# Patient Record
Sex: Male | Born: 1970 | Race: White | Hispanic: No | Smoking: Never smoker
Health system: Southern US, Community
[De-identification: ages and names within clinical notes are randomized; demographics above are authoritative.]

## PROBLEM LIST (undated history)

## (undated) DIAGNOSIS — E11319 Type 2 diabetes mellitus with unspecified diabetic retinopathy without macular edema: Secondary | ICD-10-CM

## (undated) DIAGNOSIS — R161 Splenomegaly, not elsewhere classified: Secondary | ICD-10-CM

## (undated) DIAGNOSIS — R519 Headache, unspecified: Secondary | ICD-10-CM

## (undated) DIAGNOSIS — J189 Pneumonia, unspecified organism: Secondary | ICD-10-CM

## (undated) DIAGNOSIS — K219 Gastro-esophageal reflux disease without esophagitis: Secondary | ICD-10-CM

## (undated) DIAGNOSIS — E7800 Pure hypercholesterolemia, unspecified: Secondary | ICD-10-CM

## (undated) DIAGNOSIS — I10 Essential (primary) hypertension: Secondary | ICD-10-CM

## (undated) DIAGNOSIS — G4730 Sleep apnea, unspecified: Secondary | ICD-10-CM

## (undated) DIAGNOSIS — E119 Type 2 diabetes mellitus without complications: Secondary | ICD-10-CM

## (undated) DIAGNOSIS — H3581 Retinal edema: Secondary | ICD-10-CM

## (undated) DIAGNOSIS — U071 COVID-19: Secondary | ICD-10-CM

## (undated) DIAGNOSIS — I509 Heart failure, unspecified: Secondary | ICD-10-CM

## (undated) DIAGNOSIS — Q800 Ichthyosis vulgaris: Secondary | ICD-10-CM

## (undated) DIAGNOSIS — Z87442 Personal history of urinary calculi: Secondary | ICD-10-CM

## (undated) HISTORY — PX: OTHER SURGICAL HISTORY: SHX169

## (undated) HISTORY — PX: CARDIAC CATHETERIZATION: SHX172

## (undated) HISTORY — DX: Ichthyosis vulgaris: Q80.0

## (undated) HISTORY — DX: Retinal edema: H35.81

## (undated) HISTORY — PX: TONSILLECTOMY: SUR1361

## (undated) HISTORY — DX: Type 2 diabetes mellitus with unspecified diabetic retinopathy without macular edema: E11.319

---

## 2015-02-23 DIAGNOSIS — Q800 Ichthyosis vulgaris: Secondary | ICD-10-CM | POA: Insufficient documentation

## 2015-02-23 DIAGNOSIS — IMO0002 Reserved for concepts with insufficient information to code with codable children: Secondary | ICD-10-CM | POA: Insufficient documentation

## 2015-02-23 DIAGNOSIS — E785 Hyperlipidemia, unspecified: Secondary | ICD-10-CM | POA: Insufficient documentation

## 2015-06-25 DIAGNOSIS — M1711 Unilateral primary osteoarthritis, right knee: Secondary | ICD-10-CM | POA: Insufficient documentation

## 2015-06-25 DIAGNOSIS — M179 Osteoarthritis of knee, unspecified: Secondary | ICD-10-CM | POA: Insufficient documentation

## 2017-12-12 DIAGNOSIS — M2550 Pain in unspecified joint: Secondary | ICD-10-CM | POA: Insufficient documentation

## 2020-02-23 DIAGNOSIS — U071 COVID-19: Secondary | ICD-10-CM

## 2020-02-23 HISTORY — DX: COVID-19: U07.1

## 2020-04-05 ENCOUNTER — Other Ambulatory Visit: Payer: Self-pay

## 2020-04-05 ENCOUNTER — Inpatient Hospital Stay (HOSPITAL_COMMUNITY)
Admission: EM | Admit: 2020-04-05 | Discharge: 2020-04-07 | DRG: 086 | Disposition: A | Discharge status: Home / Self Care | Payer: Medicaid - Out of State | Attending: Internal Medicine | Admitting: Internal Medicine

## 2020-04-05 ENCOUNTER — Encounter (HOSPITAL_COMMUNITY): Payer: Self-pay | Admitting: Emergency Medicine

## 2020-04-05 ENCOUNTER — Emergency Department (HOSPITAL_COMMUNITY): Payer: Medicaid - Out of State

## 2020-04-05 DIAGNOSIS — I619 Nontraumatic intracerebral hemorrhage, unspecified: Secondary | ICD-10-CM

## 2020-04-05 DIAGNOSIS — E876 Hypokalemia: Secondary | ICD-10-CM | POA: Diagnosis not present

## 2020-04-05 DIAGNOSIS — R109 Unspecified abdominal pain: Secondary | ICD-10-CM

## 2020-04-05 DIAGNOSIS — R112 Nausea with vomiting, unspecified: Secondary | ICD-10-CM | POA: Diagnosis present

## 2020-04-05 DIAGNOSIS — E1165 Type 2 diabetes mellitus with hyperglycemia: Secondary | ICD-10-CM | POA: Diagnosis present

## 2020-04-05 DIAGNOSIS — R161 Splenomegaly, not elsewhere classified: Secondary | ICD-10-CM

## 2020-04-05 DIAGNOSIS — E785 Hyperlipidemia, unspecified: Secondary | ICD-10-CM | POA: Diagnosis present

## 2020-04-05 DIAGNOSIS — N179 Acute kidney failure, unspecified: Secondary | ICD-10-CM | POA: Diagnosis present

## 2020-04-05 DIAGNOSIS — S06341A Traumatic hemorrhage of right cerebrum with loss of consciousness of 30 minutes or less, initial encounter: Secondary | ICD-10-CM

## 2020-04-05 DIAGNOSIS — R531 Weakness: Secondary | ICD-10-CM

## 2020-04-05 DIAGNOSIS — W19XXXA Unspecified fall, initial encounter: Secondary | ICD-10-CM | POA: Diagnosis present

## 2020-04-05 DIAGNOSIS — U099 Post covid-19 condition, unspecified: Secondary | ICD-10-CM | POA: Diagnosis present

## 2020-04-05 DIAGNOSIS — D696 Thrombocytopenia, unspecified: Secondary | ICD-10-CM | POA: Diagnosis present

## 2020-04-05 DIAGNOSIS — Y92009 Unspecified place in unspecified non-institutional (private) residence as the place of occurrence of the external cause: Secondary | ICD-10-CM

## 2020-04-05 DIAGNOSIS — Z8701 Personal history of pneumonia (recurrent): Secondary | ICD-10-CM

## 2020-04-05 DIAGNOSIS — K3184 Gastroparesis: Secondary | ICD-10-CM | POA: Diagnosis present

## 2020-04-05 DIAGNOSIS — E860 Dehydration: Secondary | ICD-10-CM | POA: Diagnosis present

## 2020-04-05 DIAGNOSIS — U071 COVID-19: Secondary | ICD-10-CM

## 2020-04-05 DIAGNOSIS — I10 Essential (primary) hypertension: Secondary | ICD-10-CM | POA: Diagnosis present

## 2020-04-05 DIAGNOSIS — Z9114 Patient's other noncompliance with medication regimen: Secondary | ICD-10-CM

## 2020-04-05 HISTORY — DX: Essential (primary) hypertension: I10

## 2020-04-05 HISTORY — DX: Type 2 diabetes mellitus without complications: E11.9

## 2020-04-05 HISTORY — DX: Pure hypercholesterolemia, unspecified: E78.00

## 2020-04-05 HISTORY — DX: COVID-19: U07.1

## 2020-04-05 LAB — DIFFERENTIAL
Abs Immature Granulocytes: 0.04 10*3/uL (ref 0.00–0.07)
Basophils Absolute: 0 10*3/uL (ref 0.0–0.1)
Basophils Relative: 0 %
Eosinophils Absolute: 0 10*3/uL (ref 0.0–0.5)
Eosinophils Relative: 0 %
Immature Granulocytes: 1 %
Lymphocytes Relative: 9 %
Lymphs Abs: 0.8 10*3/uL (ref 0.7–4.0)
Monocytes Absolute: 0.4 10*3/uL (ref 0.1–1.0)
Monocytes Relative: 5 %
Neutro Abs: 7.1 10*3/uL (ref 1.7–7.7)
Neutrophils Relative %: 85 %

## 2020-04-05 LAB — CBC
HCT: 43 % (ref 39.0–52.0)
Hemoglobin: 14 g/dL (ref 13.0–17.0)
MCH: 27.1 pg (ref 26.0–34.0)
MCHC: 32.6 g/dL (ref 30.0–36.0)
MCV: 83.2 fL (ref 80.0–100.0)
Platelets: 101 10*3/uL — ABNORMAL LOW (ref 150–400)
RBC: 5.17 MIL/uL (ref 4.22–5.81)
RDW: 14 % (ref 11.5–15.5)
WBC: 8.6 10*3/uL (ref 4.0–10.5)
nRBC: 0 % (ref 0.0–0.2)

## 2020-04-05 LAB — COMPREHENSIVE METABOLIC PANEL
ALT: 27 U/L (ref 0–44)
AST: 23 U/L (ref 15–41)
Albumin: 4.5 g/dL (ref 3.5–5.0)
Alkaline Phosphatase: 104 U/L (ref 38–126)
Anion gap: 19 — ABNORMAL HIGH (ref 5–15)
BUN: 28 mg/dL — ABNORMAL HIGH (ref 6–20)
CO2: 26 mmol/L (ref 22–32)
Calcium: 9.6 mg/dL (ref 8.9–10.3)
Chloride: 89 mmol/L — ABNORMAL LOW (ref 98–111)
Creatinine, Ser: 1.57 mg/dL — ABNORMAL HIGH (ref 0.61–1.24)
GFR, Estimated: 54 mL/min — ABNORMAL LOW (ref >60)
Glucose, Bld: 268 mg/dL — ABNORMAL HIGH (ref 70–99)
Potassium: 4 mmol/L (ref 3.5–5.1)
Sodium: 134 mmol/L — ABNORMAL LOW (ref 135–145)
Total Bilirubin: 2 mg/dL — ABNORMAL HIGH (ref 0.3–1.2)
Total Protein: 7.7 g/dL (ref 6.5–8.1)

## 2020-04-05 LAB — LIPASE, BLOOD: Lipase: 44 U/L (ref 11–51)

## 2020-04-05 IMAGING — CT CT HEAD W/O CM
3 series · 15 of 47 positions shown, 18 images · non-contrast
Comparison: None.

CLINICAL DATA: Nausea vomiting for 3 weeks. Positive [CX] test
on [DATE]. Fell today hitting his head.

EXAM:
CT HEAD WITHOUT CONTRAST
TECHNIQUE: Contiguous axial images were obtained from the base of the skull
through the vertex without intravenous contrast.

[Series 2: head w o · axial · 0.42mm/px · z∈[+43,+168]mm · 9 of 31 slices shown, 12 images]
[im 3/31  brain]
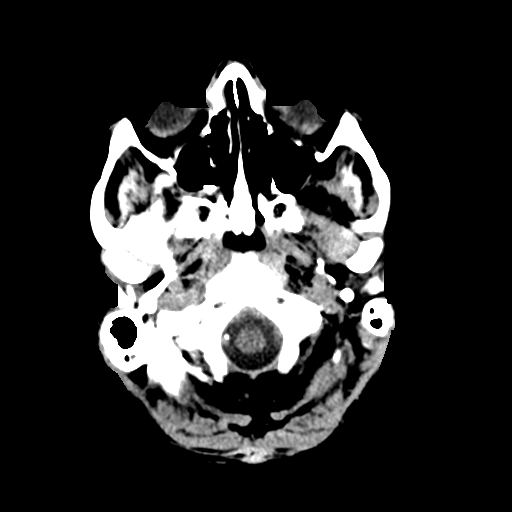
[im 3/31  bone]
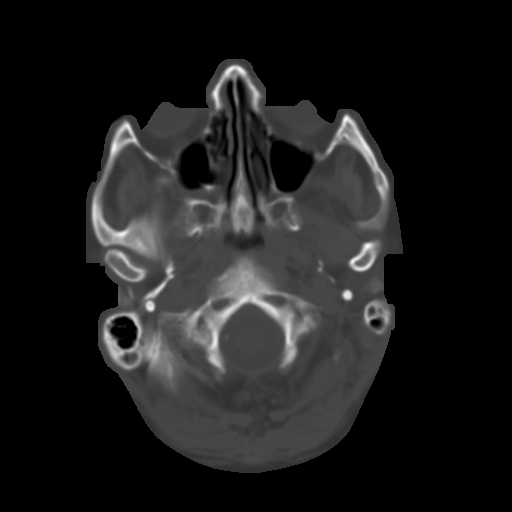
[im 6/31  brain]
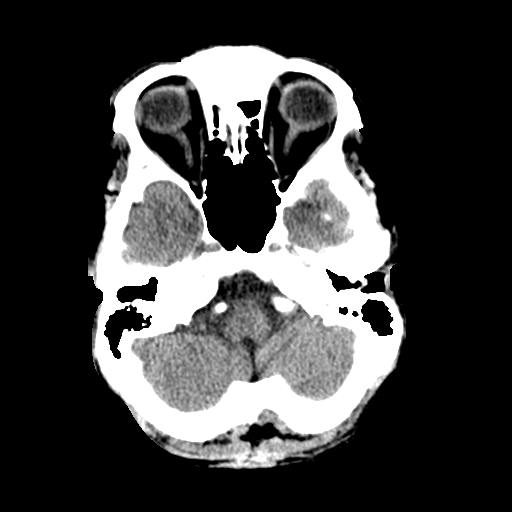
[im 9/31  brain]
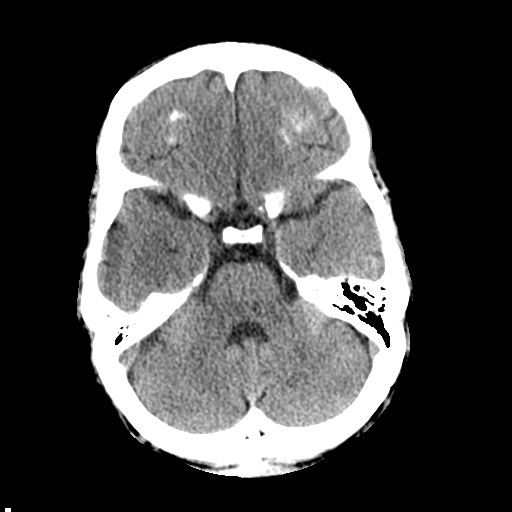
[im 12/31  brain]
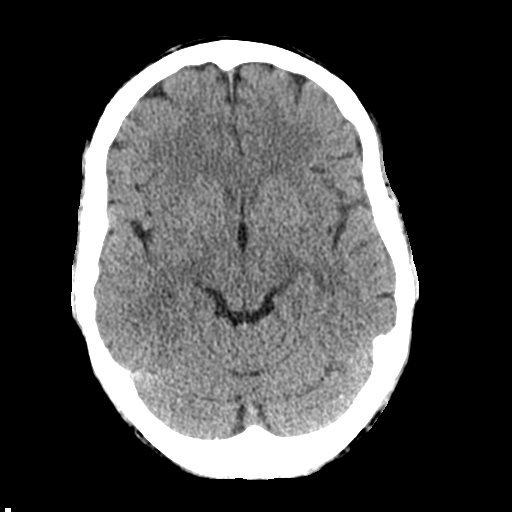
[im 16/31  brain]
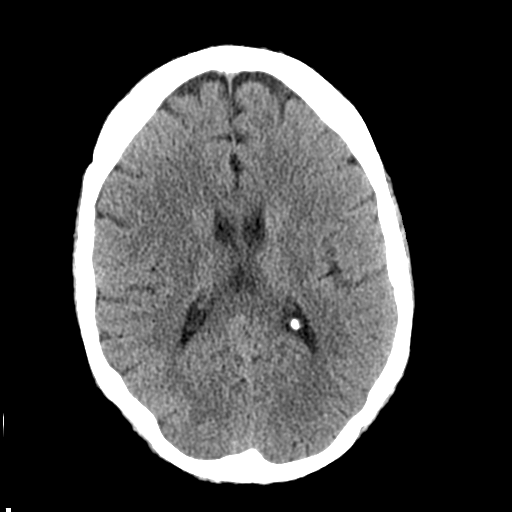
[im 16/31  bone]
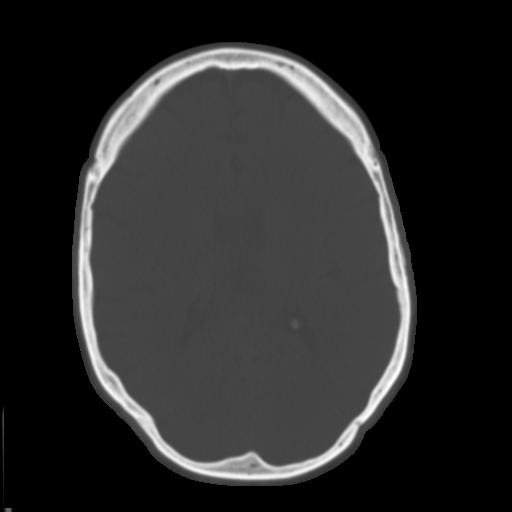
[im 19/31  brain]
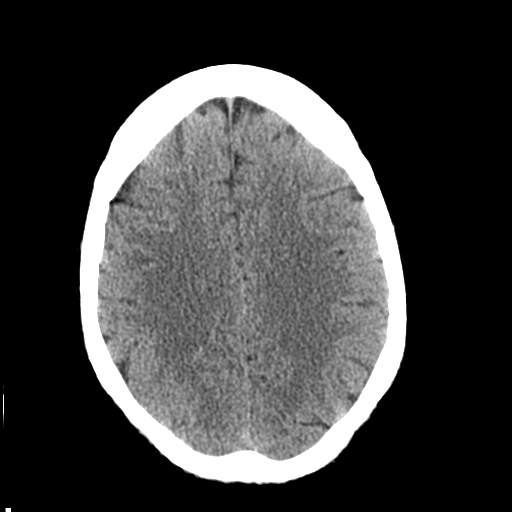
[im 22/31  brain]
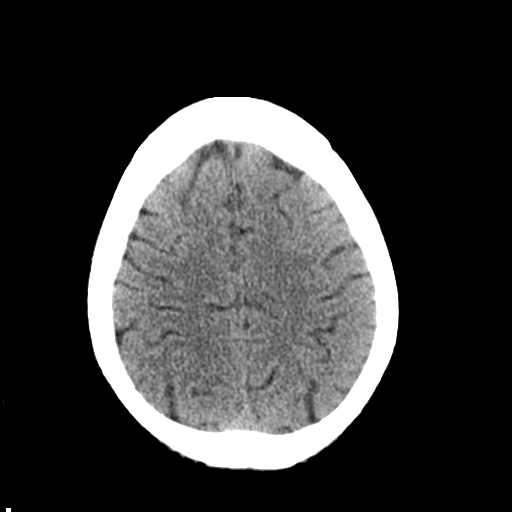
[im 25/31  brain]
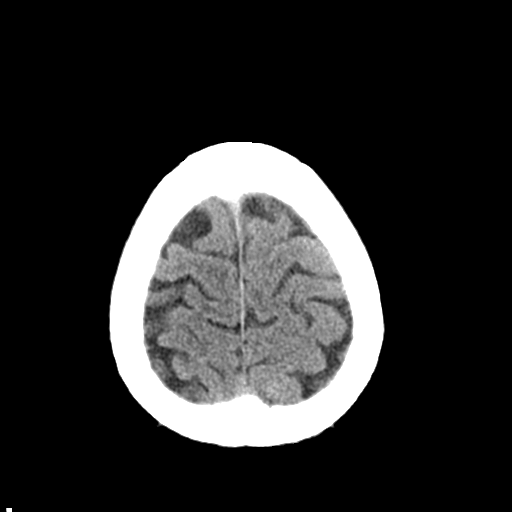
[im 28/31  brain]
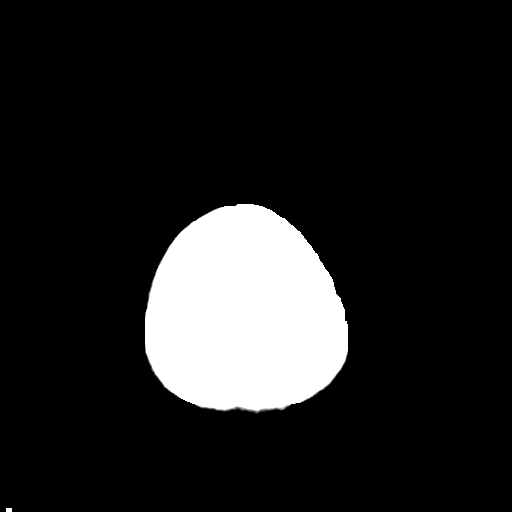
[im 28/31  bone]
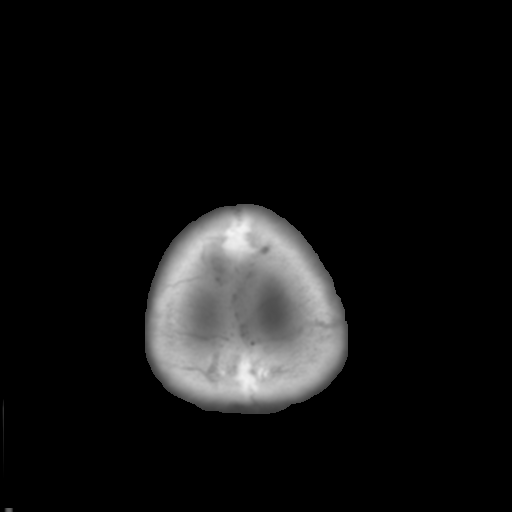

[Series 4: coronal soft · coronal · 0.33mm/px · 3 of 71 slices shown]
[im 24/71  brain]
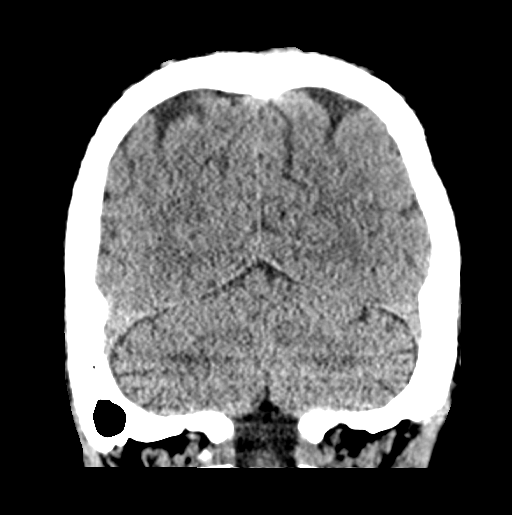
[im 32/71  brain]
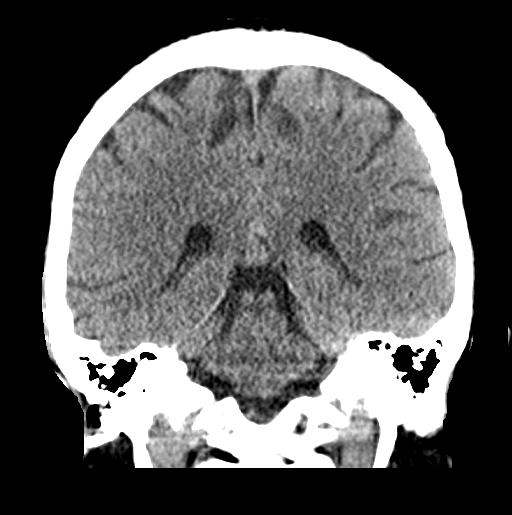
[im 39/71  brain]
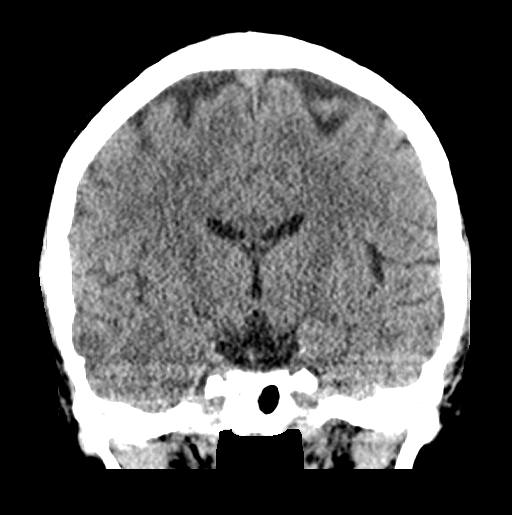

[Series 5: sagittal soft · sagittal · 0.34mm/px · 3 of 56 slices shown]
[im 19/56  brain]
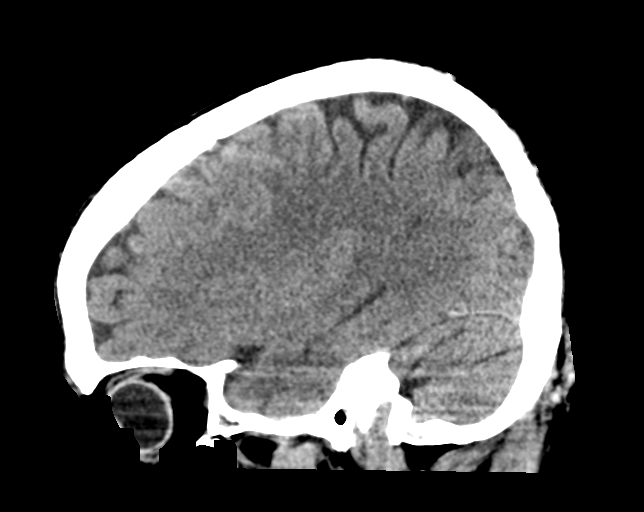
[im 28/56  brain]
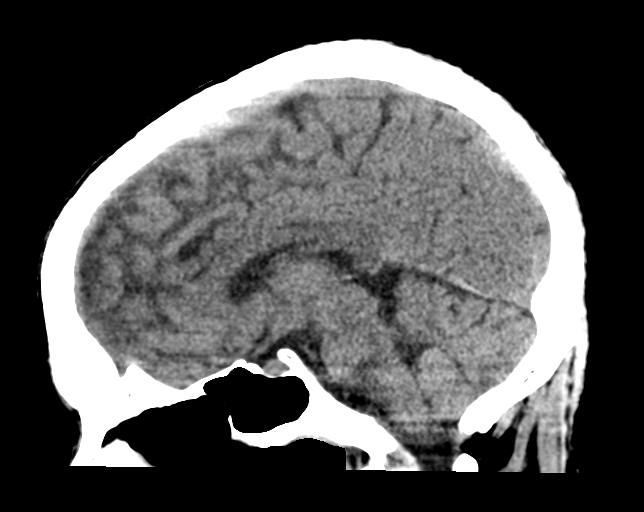
[im 37/56  brain]
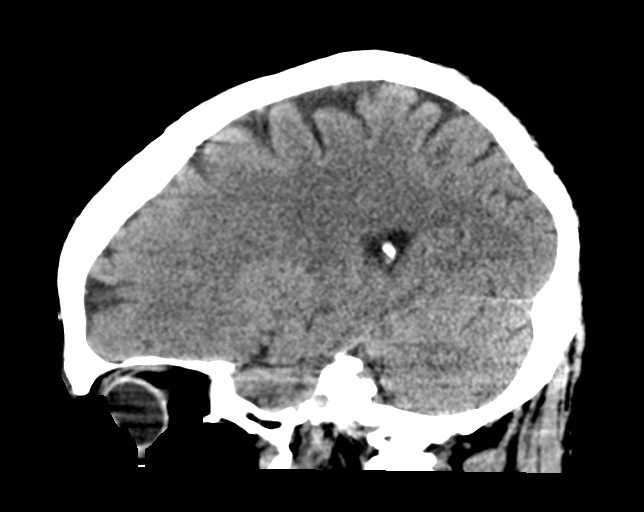

[15 of 47 positions shown; findings below may reference images not displayed]

FINDINGS: Brain: Subcentimeter focus of hyperattenuation in the right parietal
lobe posterosuperior to the atrium of the right lateral ventricle.
This may reflect a focus parenchymal hemorrhage/contusion. There is
no other evidence of intracranial hemorrhage.

Ventricles are normal in size and configuration. There are no
parenchymal masses or mass effect. No evidence of an infarct. There
are no extra-axial masses or abnormal fluid collections.

Vascular: No hyperdense vessel or unexpected calcification.

Skull: Normal. Negative for fracture or focal lesion.

Sinuses/Orbits: Globes and orbits are unremarkable. Small amount of
dependent fluid in the right maxillary sinus. Minor ethmoid sinus
mucosal thickening. Aplastic frontal sinuses.

Other: None.
IMPRESSION: 1. Subcentimeter focus hyperattenuation in the white matter of the
right parietal lobe. This is nonspecific but could reflect a small
parenchymal hemorrhage/contusion. This could be further assessed
with brain MRI without contrast or with follow-up CT scans assess
for stability or resolution.
2. No other intracranial abnormalities.
3. Small amount of dependent fluid in the right sphenoid sinus.
Minor ethmoid sinus mucosal thickening.

## 2020-04-05 IMAGING — CT CT ABD-PELV W/ CM
2 of 5 series · 15 of 46 positions shown, 17 images · IV contrast (Omnipaque or Isovue)
Comparison: Chest x-ray [DATE]

CLINICAL DATA: Nausea and vomiting. X 3 weeks. Positive for COVID
on [DATE]

EXAM:
CT ABDOMEN AND PELVIS WITH CONTRAST
TECHNIQUE: Multidetector CT imaging of the abdomen and pelvis was performed
using the standard protocol following bolus administration of
intravenous contrast.
CONTRAST:  100mL OMNIPAQUE IOHEXOL 300 MG/ML  SOLN

[Series 2: axial st · axial · 0.73mm/px · z∈[-757,-282]mm · 12 of 107 slices shown, 14 images]
[im 6/107  soft-tissue]
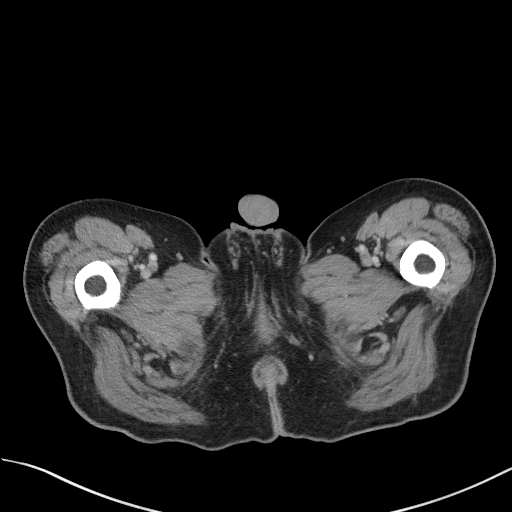
[im 6/107  bone]
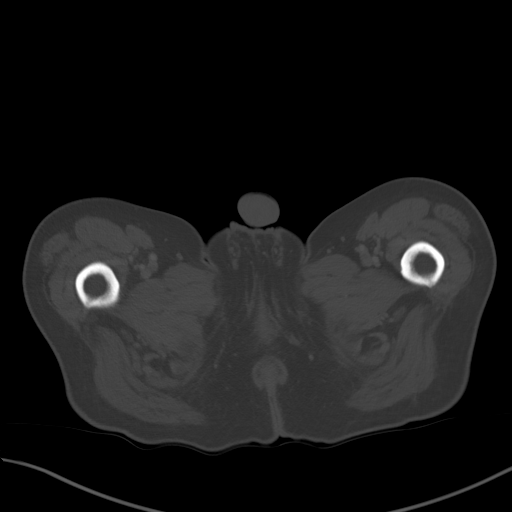
[im 17/107  soft-tissue]
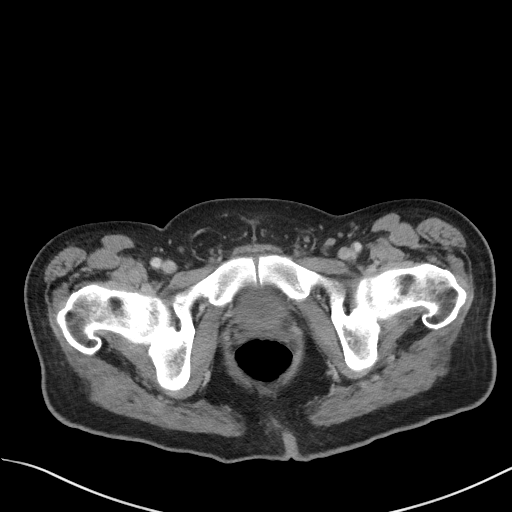
[im 23/107  soft-tissue]
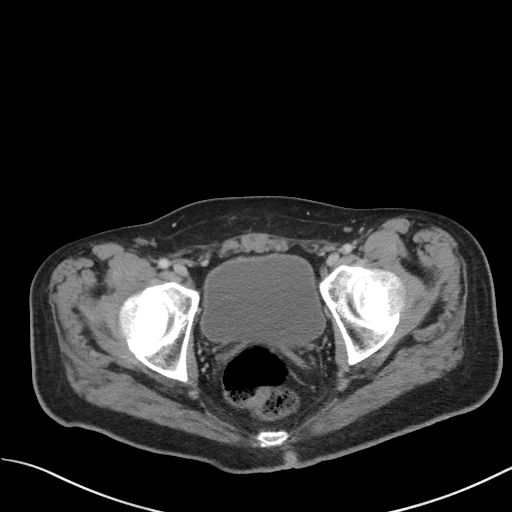
[im 34/107  soft-tissue]
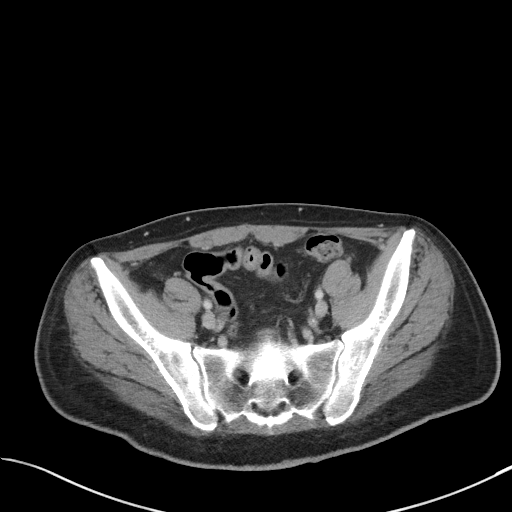
[im 40/107  soft-tissue]
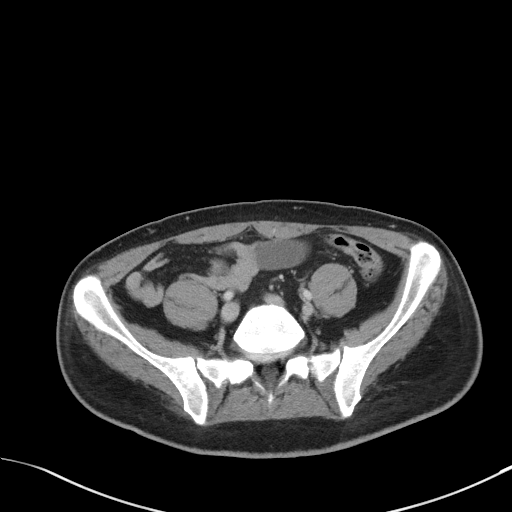
[im 51/107  soft-tissue]
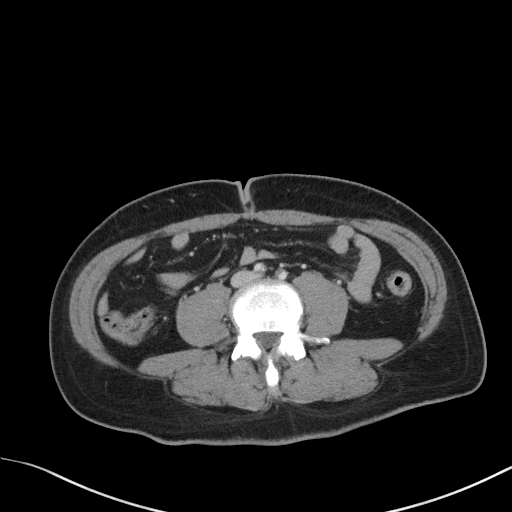
[im 56/107  soft-tissue]
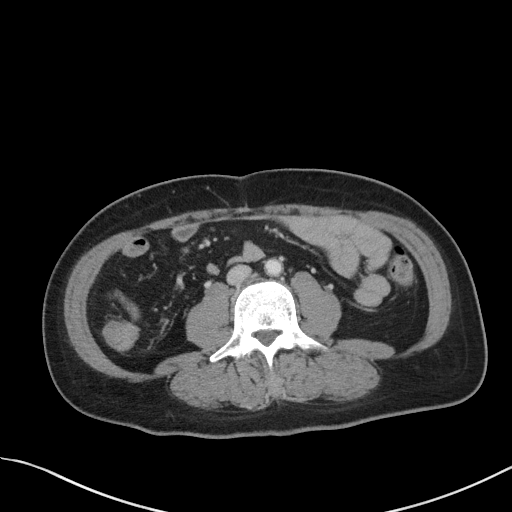
[im 67/107  soft-tissue]
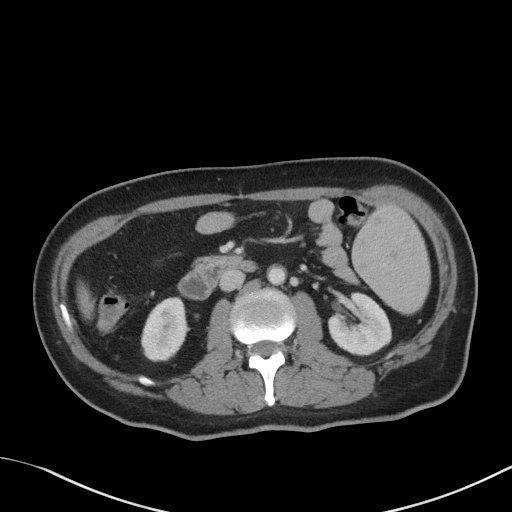
[im 73/107  soft-tissue]
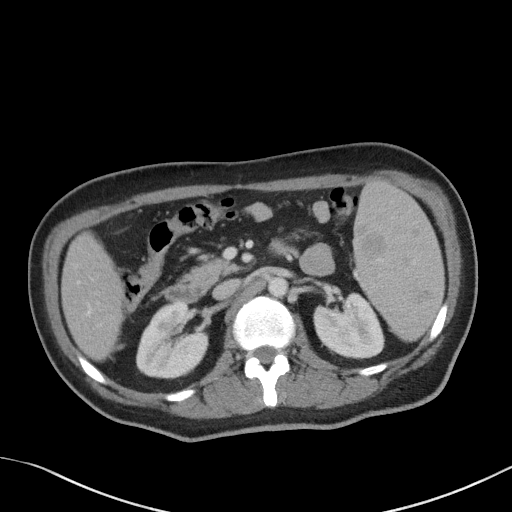
[im 73/107  bone]
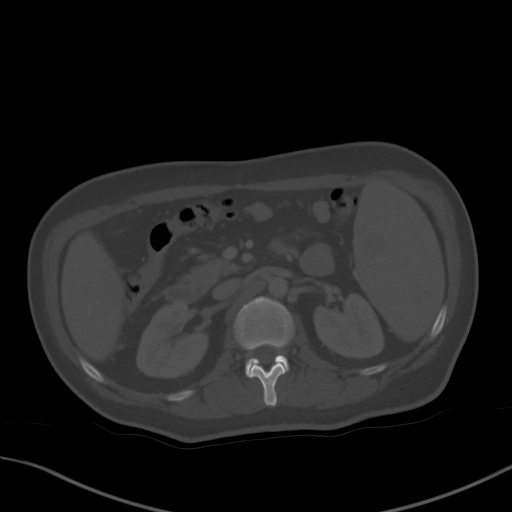
[im 84/107  soft-tissue]
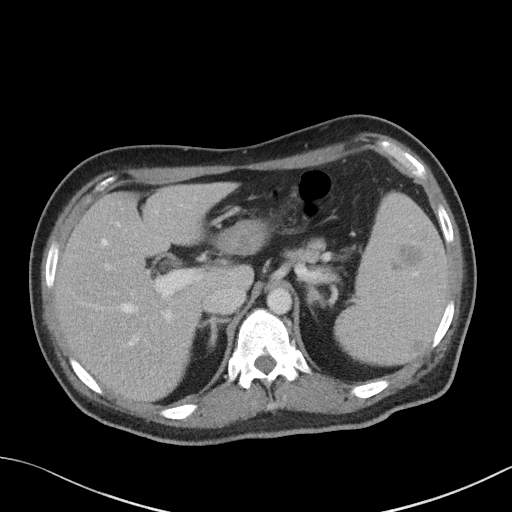
[im 90/107  soft-tissue]
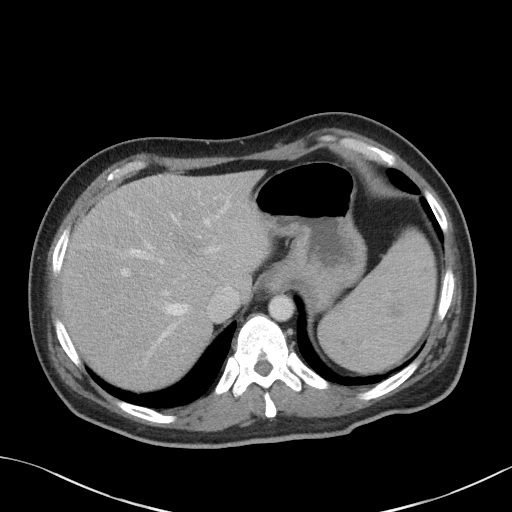
[im 101/107  soft-tissue]
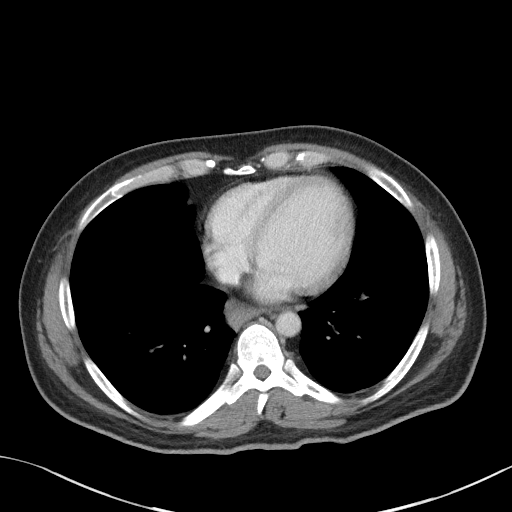

[Series 5: coronal st · coronal · 0.78mm/px · 3 of 86 slices shown]
[im 29/86  soft-tissue]
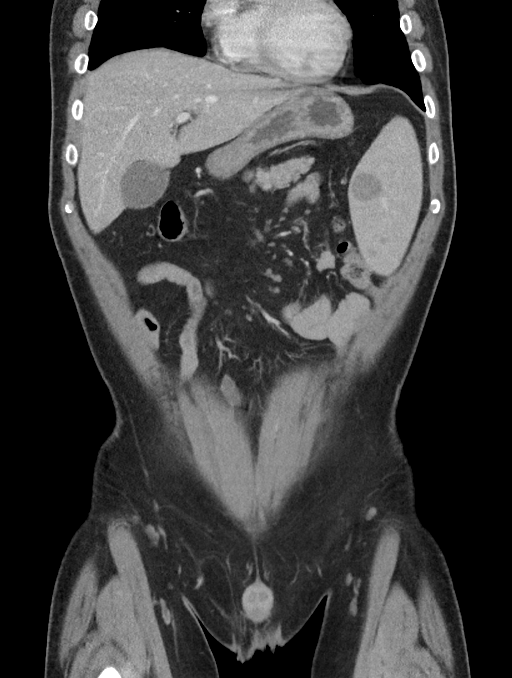
[im 38/86  soft-tissue]
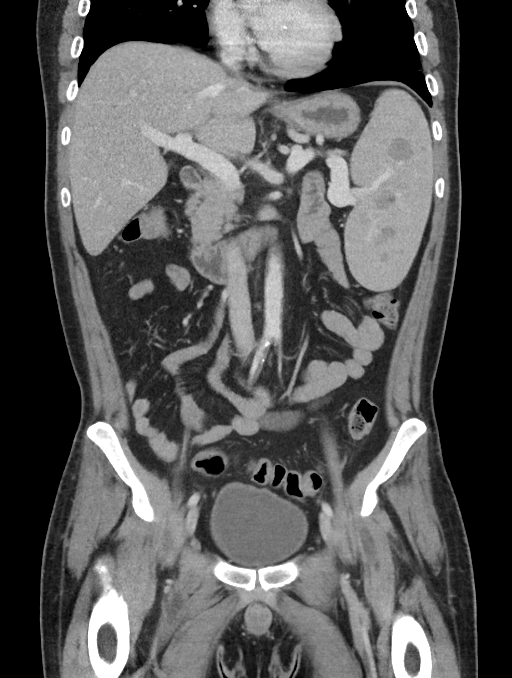
[im 48/86  soft-tissue]
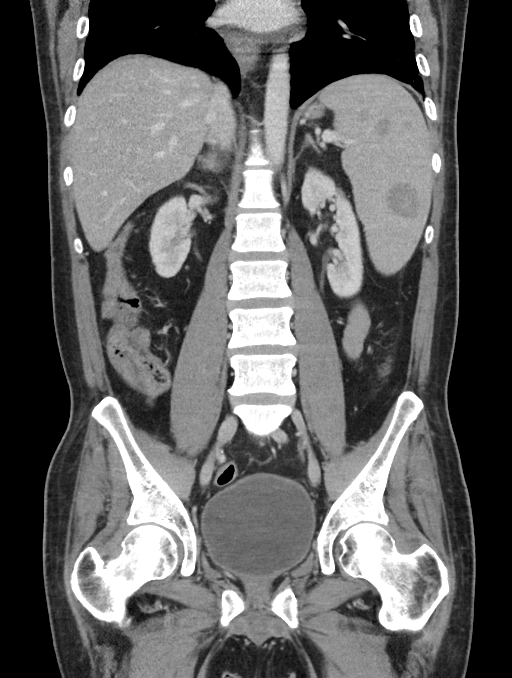

[15 of 46 positions shown; findings below may reference images not displayed]

FINDINGS: Lower chest: Vague ground-glass patchy airspace opacities within the
visualized lung bases as well as linear atelectasis versus scarring
within bilateral lower lobes.

Hepatobiliary: The hepatic parenchyma is diffusely hypodense
compared to the splenic parenchyma consistent with fatty
infiltration. No focal liver abnormality. No gallstones, gallbladder
wall thickening, or pericholecystic fluid. No biliary dilatation.

Pancreas: No focal lesion. Normal pancreatic contour. No surrounding
inflammatory changes. No main pancreatic ductal dilatation.

Spleen: Enlarged spleen measuring up to 15 cm with multiple
hypodense soft tissue density lesions with as an example a 2.3 cm
lesion ([DATE]) as well as a 2.7 cm lesion ([DATE]). Suggestion of a
splenule ([DATE]).

Adrenals/Urinary Tract: No adrenal nodule bilaterally. Bilateral
kidneys enhance symmetrically. No hydronephrosis. No hydroureter.
The urinary bladder is unremarkable.

Stomach/Bowel: Stomach is within normal limits. No evidence of bowel
wall thickening or dilatation. Appendix appears normal.

Vascular/Lymphatic: The portal, splenic, superior mesenteric veins
are patent. No abdominal aorta or iliac aneurysm. At least moderate
calcified and noncalcified atherosclerotic plaque of the aorta and
its branches. No abdominal, pelvic, or inguinal lymphadenopathy.

Reproductive: Prostate is unremarkable.

Other: No intraperitoneal free fluid. No intraperitoneal free gas.
No organized fluid collection.

Musculoskeletal:

No abdominal wall hernia or abnormality

No suspicious lytic or blastic osseous lesions. No acute displaced
fracture.
IMPRESSION: 1. Splenomegaly with multiple hypodense soft tissue splenic lesions.
Findings could represent a lymphoproliferative disorder. Consider
PET-CT for further evaluation.
2. Bilateral lower lobe lung base findings suggestive of [3Z]
infection.
3. Mild hepatic steatosis.
4.  Aortic Atherosclerosis ([3Z]-[3Z]).

## 2020-04-05 MED ORDER — SODIUM CHLORIDE 0.9 % IV BOLUS
1000.0000 mL | Freq: Once | INTRAVENOUS | Status: AC
  Administered 2020-04-05: 1000 mL via INTRAVENOUS

## 2020-04-05 MED ORDER — PROMETHAZINE HCL 25 MG/ML IJ SOLN
25.0000 mg | Freq: Once | INTRAMUSCULAR | Status: AC
  Administered 2020-04-05: 25 mg via INTRAVENOUS
  Filled 2020-04-05: qty 1

## 2020-04-05 MED ORDER — IOHEXOL 300 MG/ML  SOLN
100.0000 mL | Freq: Once | INTRAMUSCULAR | Status: AC | PRN
  Administered 2020-04-05: 100 mL via INTRAVENOUS

## 2020-04-05 NOTE — ED Triage Notes (Signed)
Pt c/o n/v x 3 weeks. Pt tested positive for covid 1/24. Pt states hes been throwing up blood. Pt states he fell today hitting his head, denies being on blood thinners.
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
## 2020-04-06 ENCOUNTER — Inpatient Hospital Stay (HOSPITAL_COMMUNITY): Payer: Medicaid - Out of State

## 2020-04-06 DIAGNOSIS — Z9114 Patient's other noncompliance with medication regimen: Secondary | ICD-10-CM | POA: Diagnosis not present

## 2020-04-06 DIAGNOSIS — R109 Unspecified abdominal pain: Secondary | ICD-10-CM

## 2020-04-06 DIAGNOSIS — E785 Hyperlipidemia, unspecified: Secondary | ICD-10-CM | POA: Diagnosis present

## 2020-04-06 DIAGNOSIS — D696 Thrombocytopenia, unspecified: Secondary | ICD-10-CM

## 2020-04-06 DIAGNOSIS — K3184 Gastroparesis: Secondary | ICD-10-CM | POA: Diagnosis present

## 2020-04-06 DIAGNOSIS — R112 Nausea with vomiting, unspecified: Secondary | ICD-10-CM | POA: Diagnosis present

## 2020-04-06 DIAGNOSIS — U071 COVID-19: Secondary | ICD-10-CM | POA: Diagnosis not present

## 2020-04-06 DIAGNOSIS — I10 Essential (primary) hypertension: Secondary | ICD-10-CM | POA: Diagnosis present

## 2020-04-06 DIAGNOSIS — E860 Dehydration: Secondary | ICD-10-CM | POA: Diagnosis present

## 2020-04-06 DIAGNOSIS — S06341A Traumatic hemorrhage of right cerebrum with loss of consciousness of 30 minutes or less, initial encounter: Secondary | ICD-10-CM | POA: Diagnosis not present

## 2020-04-06 DIAGNOSIS — Y92009 Unspecified place in unspecified non-institutional (private) residence as the place of occurrence of the external cause: Secondary | ICD-10-CM | POA: Diagnosis not present

## 2020-04-06 DIAGNOSIS — I619 Nontraumatic intracerebral hemorrhage, unspecified: Secondary | ICD-10-CM

## 2020-04-06 DIAGNOSIS — N179 Acute kidney failure, unspecified: Secondary | ICD-10-CM | POA: Diagnosis not present

## 2020-04-06 DIAGNOSIS — Z8701 Personal history of pneumonia (recurrent): Secondary | ICD-10-CM | POA: Diagnosis not present

## 2020-04-06 DIAGNOSIS — U099 Post covid-19 condition, unspecified: Secondary | ICD-10-CM | POA: Diagnosis present

## 2020-04-06 DIAGNOSIS — R161 Splenomegaly, not elsewhere classified: Secondary | ICD-10-CM

## 2020-04-06 DIAGNOSIS — R531 Weakness: Secondary | ICD-10-CM

## 2020-04-06 DIAGNOSIS — W19XXXA Unspecified fall, initial encounter: Secondary | ICD-10-CM | POA: Diagnosis present

## 2020-04-06 DIAGNOSIS — E876 Hypokalemia: Secondary | ICD-10-CM | POA: Diagnosis not present

## 2020-04-06 DIAGNOSIS — E1165 Type 2 diabetes mellitus with hyperglycemia: Secondary | ICD-10-CM | POA: Diagnosis present

## 2020-04-06 LAB — COMPREHENSIVE METABOLIC PANEL
ALT: 22 U/L (ref 0–44)
AST: 17 U/L (ref 15–41)
Albumin: 3.8 g/dL (ref 3.5–5.0)
Alkaline Phosphatase: 88 U/L (ref 38–126)
Anion gap: 13 (ref 5–15)
BUN: 23 mg/dL — ABNORMAL HIGH (ref 6–20)
CO2: 25 mmol/L (ref 22–32)
Calcium: 8.6 mg/dL — ABNORMAL LOW (ref 8.9–10.3)
Chloride: 97 mmol/L — ABNORMAL LOW (ref 98–111)
Creatinine, Ser: 1.29 mg/dL — ABNORMAL HIGH (ref 0.61–1.24)
GFR, Estimated: >60 mL/min (ref >60)
Glucose, Bld: 183 mg/dL — ABNORMAL HIGH (ref 70–99)
Potassium: 3.6 mmol/L (ref 3.5–5.1)
Sodium: 135 mmol/L (ref 135–145)
Total Bilirubin: 1.6 mg/dL — ABNORMAL HIGH (ref 0.3–1.2)
Total Protein: 6.7 g/dL (ref 6.5–8.1)

## 2020-04-06 LAB — TROPONIN I (HIGH SENSITIVITY)
Troponin I (High Sensitivity): 5 ng/L (ref <18)
Troponin I (High Sensitivity): 5 ng/L (ref <18)

## 2020-04-06 LAB — URINALYSIS, ROUTINE W REFLEX MICROSCOPIC
Bacteria, UA: NONE SEEN
Bilirubin Urine: NEGATIVE
Glucose, UA: >=500 mg/dL — AB
Hgb urine dipstick: NEGATIVE
Ketones, ur: 20 mg/dL — AB
Leukocytes,Ua: NEGATIVE
Nitrite: NEGATIVE
Protein, ur: 30 mg/dL — AB
Specific Gravity, Urine: 1.025 (ref 1.005–1.030)
pH: 5 (ref 5.0–8.0)

## 2020-04-06 LAB — HIV ANTIBODY (ROUTINE TESTING W REFLEX): HIV Screen 4th Generation wRfx: NONREACTIVE

## 2020-04-06 LAB — GLUCOSE, CAPILLARY
Glucose-Capillary: 224 mg/dL — ABNORMAL HIGH (ref 70–99)
Glucose-Capillary: 152 mg/dL — ABNORMAL HIGH (ref 70–99)

## 2020-04-06 LAB — RAPID URINE DRUG SCREEN, HOSP PERFORMED
Amphetamines: NOT DETECTED
Barbiturates: NOT DETECTED
Benzodiazepines: NOT DETECTED
Cocaine: NOT DETECTED
Opiates: NOT DETECTED
Tetrahydrocannabinol: NOT DETECTED

## 2020-04-06 LAB — CBC
HCT: 38.2 % — ABNORMAL LOW (ref 39.0–52.0)
Hemoglobin: 12.5 g/dL — ABNORMAL LOW (ref 13.0–17.0)
MCH: 27.3 pg (ref 26.0–34.0)
MCHC: 32.7 g/dL (ref 30.0–36.0)
MCV: 83.4 fL (ref 80.0–100.0)
Platelets: 90 10*3/uL — ABNORMAL LOW (ref 150–400)
RBC: 4.58 MIL/uL (ref 4.22–5.81)
RDW: 13.9 % (ref 11.5–15.5)
WBC: 6.3 10*3/uL (ref 4.0–10.5)
nRBC: 0 % (ref 0.0–0.2)

## 2020-04-06 LAB — MAGNESIUM: Magnesium: 2.2 mg/dL (ref 1.7–2.4)

## 2020-04-06 LAB — PHOSPHORUS: Phosphorus: 3.9 mg/dL (ref 2.5–4.6)

## 2020-04-06 LAB — APTT: aPTT: 31 seconds (ref 24–36)

## 2020-04-06 LAB — HEMOGLOBIN A1C
Hgb A1c MFr Bld: 12 % — ABNORMAL HIGH (ref 4.8–5.6)
Mean Plasma Glucose: 297.7 mg/dL

## 2020-04-06 LAB — PROTIME-INR
INR: 1.1 (ref 0.8–1.2)
Prothrombin Time: 13.5 seconds (ref 11.4–15.2)

## 2020-04-06 LAB — CBG MONITORING, ED
Glucose-Capillary: 171 mg/dL — ABNORMAL HIGH (ref 70–99)
Glucose-Capillary: 178 mg/dL — ABNORMAL HIGH (ref 70–99)

## 2020-04-06 IMAGING — CT CT HEAD W/O CM
3 series · 16 of 47 positions shown, 19 images · non-contrast
Comparison: [DATE]

CLINICAL DATA: Parenchymal hemorrhage

EXAM:
CT HEAD WITHOUT CONTRAST
TECHNIQUE: Contiguous axial images were obtained from the base of the skull
through the vertex without intravenous contrast.

[Series 2: head w o · axial · 0.41mm/px · z∈[+57,+182]mm · 10 of 31 slices shown, 13 images]
[im 3/31  brain]
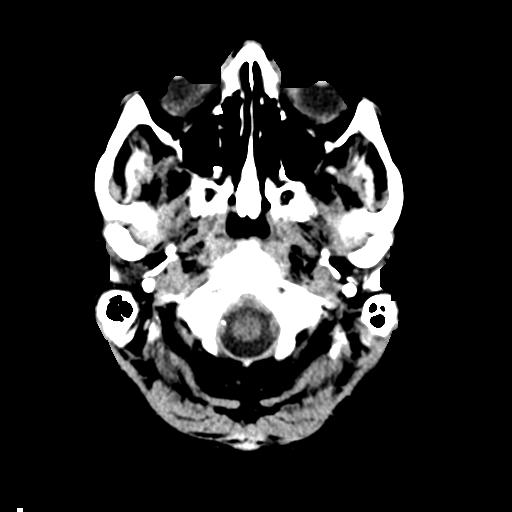
[im 3/31  bone]
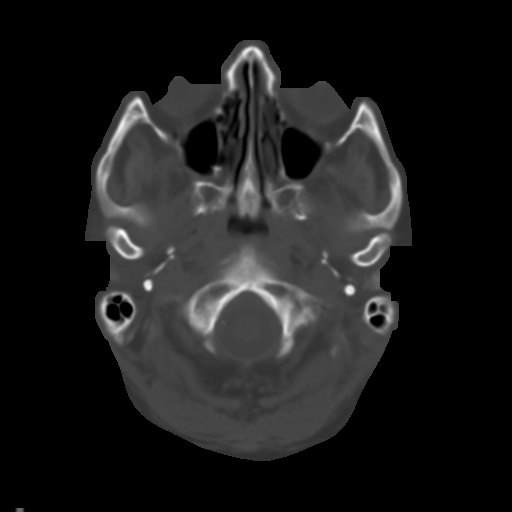
[im 6/31  brain]
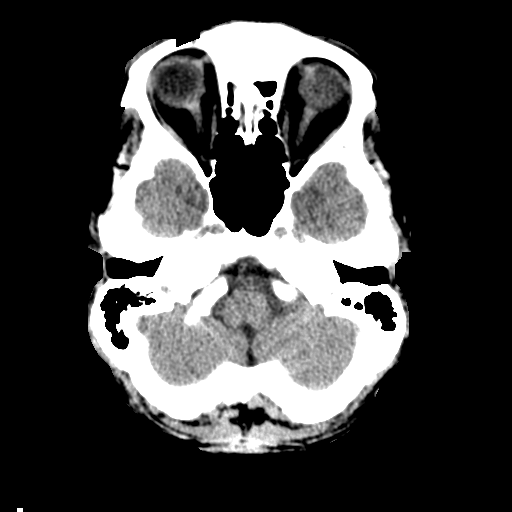
[im 9/31  brain]
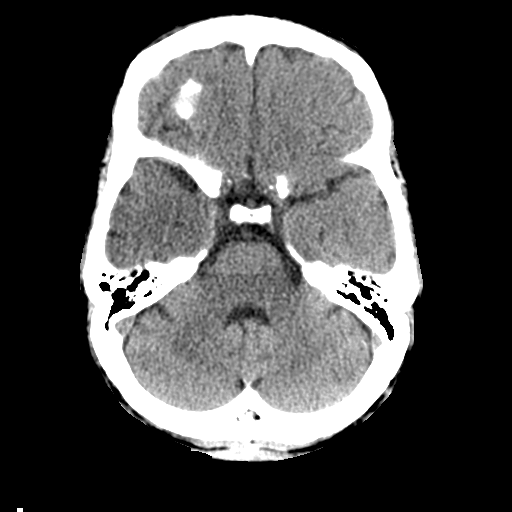
[im 11/31  brain]
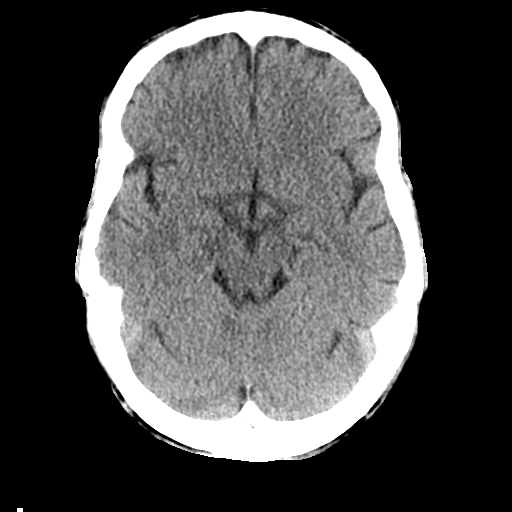
[im 14/31  brain]
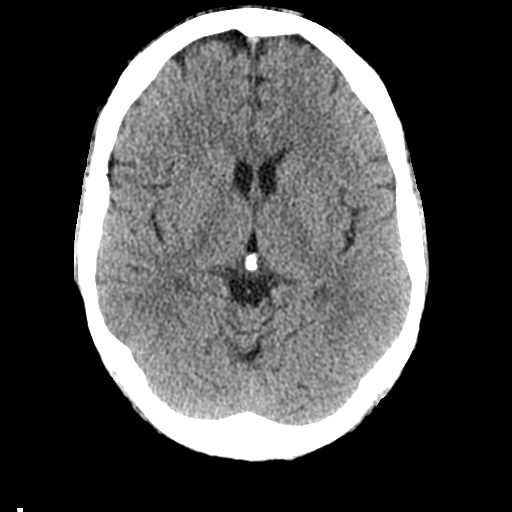
[im 14/31  bone]
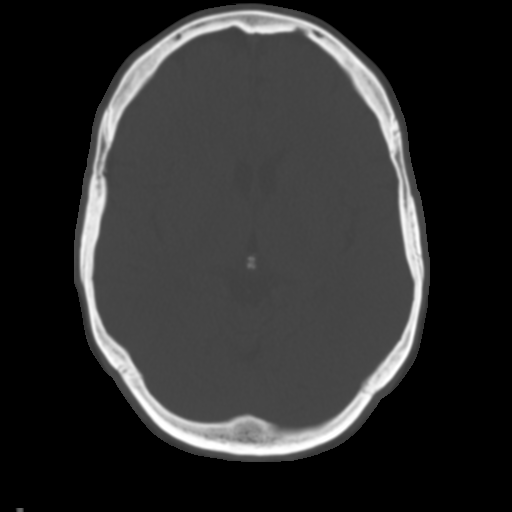
[im 17/31  brain]
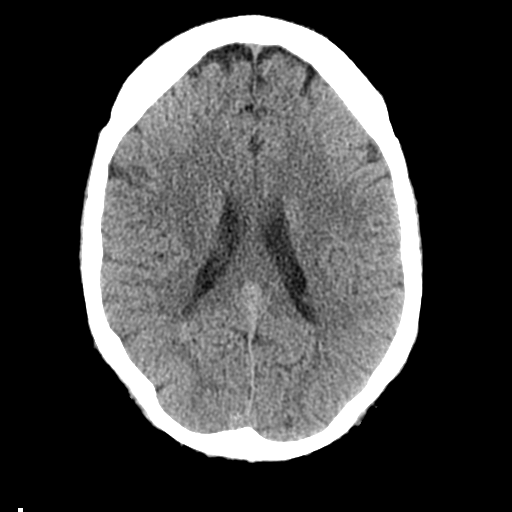
[im 20/31  brain]
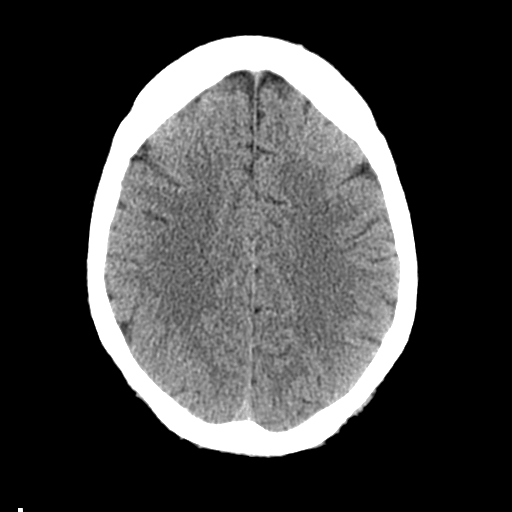
[im 23/31  brain]
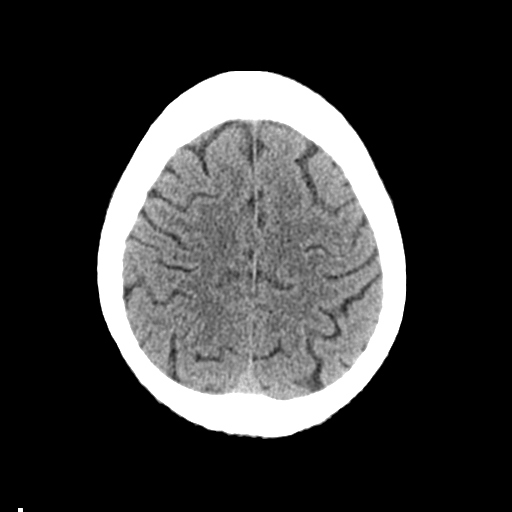
[im 25/31  brain]
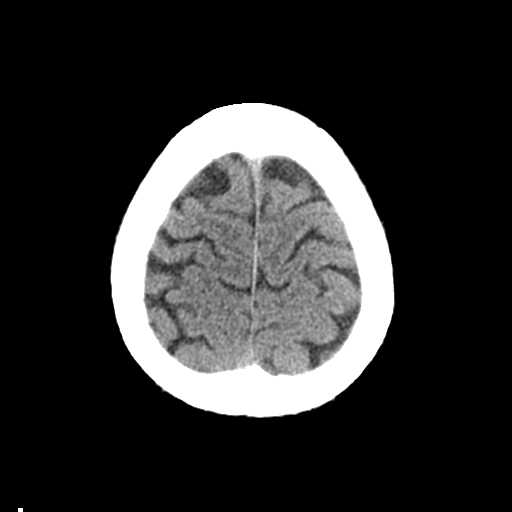
[im 25/31  bone]
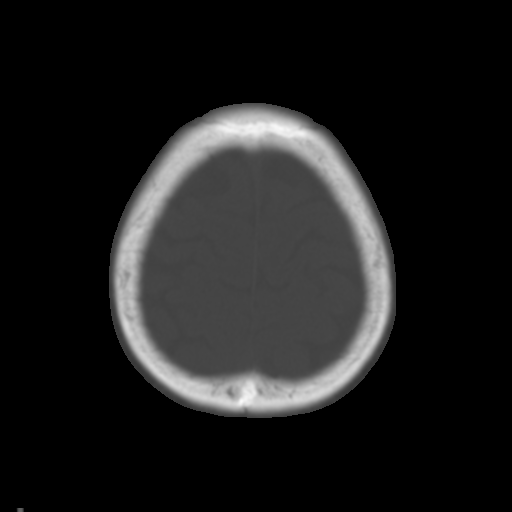
[im 28/31  brain]
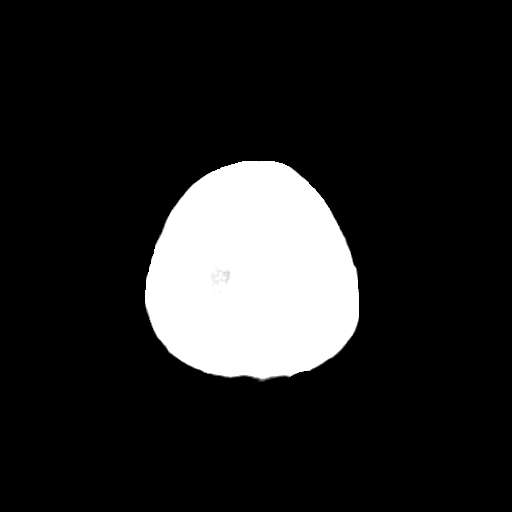

[Series 4: coronal soft · coronal · 0.35mm/px · 3 of 67 slices shown]
[im 23/67  brain]
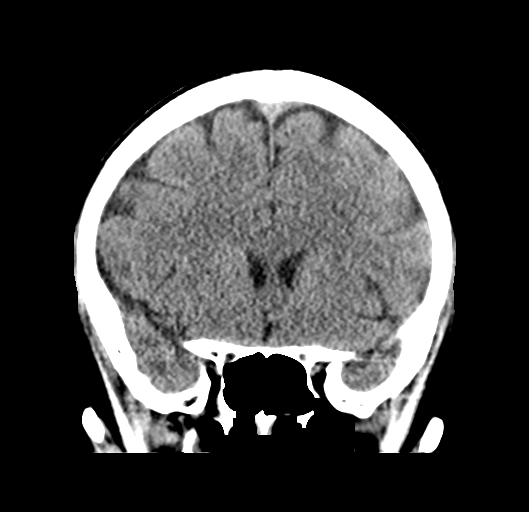
[im 30/67  brain]
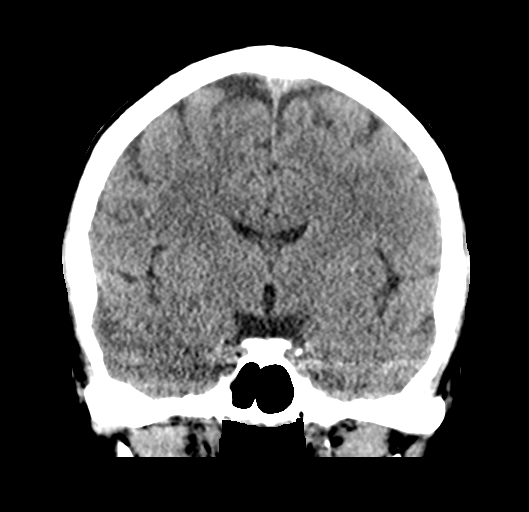
[im 37/67  brain]
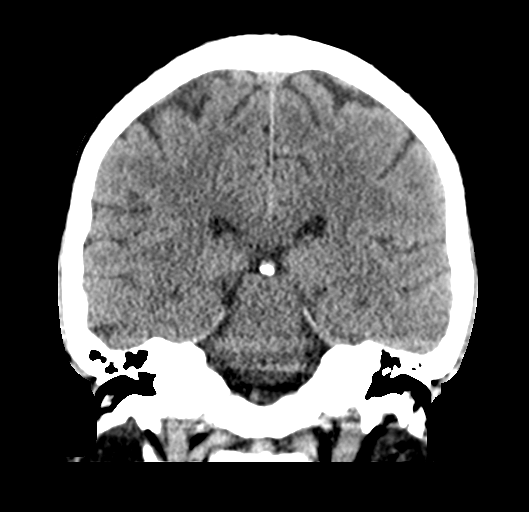

[Series 5: sagittal soft · sagittal · 0.33mm/px · 3 of 54 slices shown]
[im 18/54  brain]
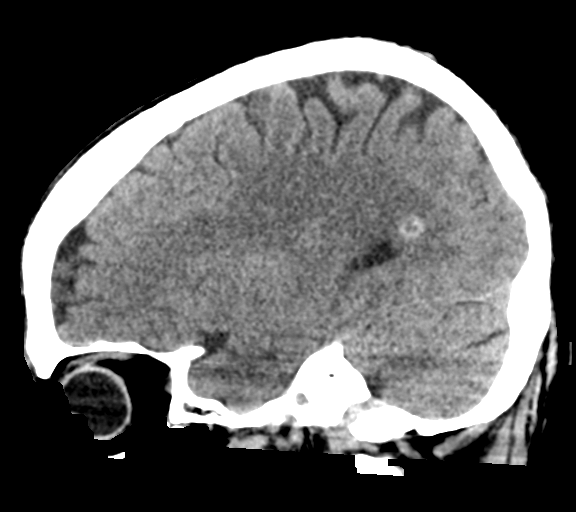
[im 27/54  brain]
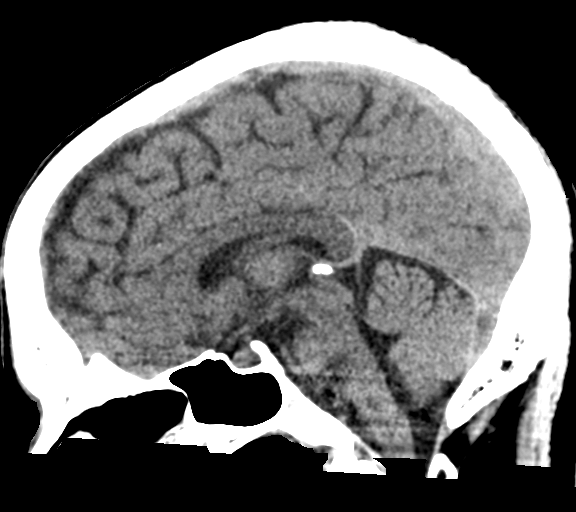
[im 36/54  brain]
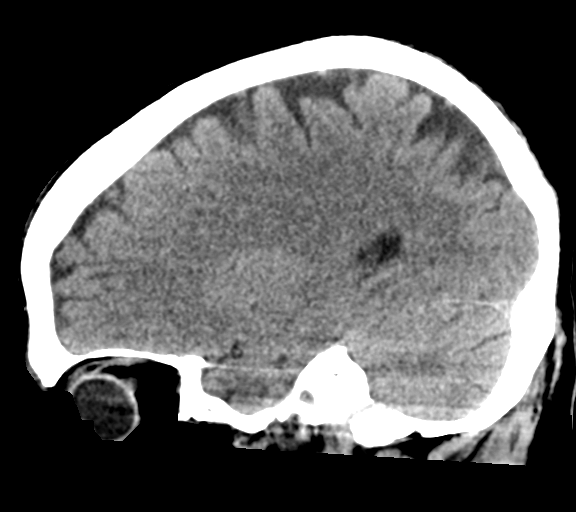

[16 of 47 positions shown; findings below may reference images not displayed]

FINDINGS: Brain: Ventricles and sulci are normal in size and configuration. A
focal area of hemorrhage slightly posterior to the superior atrium
right lateral ventricle is unchanged in size and contour. This focus
of hemorrhage is best seen on sagittal image, and measures 7 x 7 mm.
There is no surrounding edema in this area. No new focus of
hemorrhage is evident. There is no mass, extra-axial fluid
collection, or midline shift. Ventricles and sulci normal in size
and configuration. No evident acute infarct.

Vascular: No hyperdense vessel. There is slight calcification in
each carotid siphon region.

Skull: Bony calvarium appears intact.

Sinuses/Orbits: Mucosal thickening noted in each maxillary antrum.
Orbits appear symmetric bilaterally.

Other: Mastoid air cells are clear. There is debris in each external
auditory canal.
IMPRESSION: Stable 7 x 7 mm focus of hemorrhage in the posterosuperior
periventricular white matter on the right. No new or enlarging focus
of hemorrhage. No surrounding edema at this site of subcentimeter
hemorrhage. Brain parenchyma otherwise appears normal. No
extra-axial fluid or midline shift.

Slight calcification noted in each carotid siphon region. Maxillary
sinus disease noted.

## 2020-04-06 MED ORDER — INSULIN ASPART 100 UNIT/ML ~~LOC~~ SOLN
0.0000 [IU] | SUBCUTANEOUS | Status: DC
  Administered 2020-04-06: 2 [IU] via SUBCUTANEOUS
  Administered 2020-04-06: 3 [IU] via SUBCUTANEOUS
  Administered 2020-04-06 (×2): 2 [IU] via SUBCUTANEOUS
  Administered 2020-04-07: 1 [IU] via SUBCUTANEOUS
  Filled 2020-04-06 (×2): qty 1

## 2020-04-06 MED ORDER — METOCLOPRAMIDE HCL 5 MG/ML IJ SOLN
5.0000 mg | Freq: Four times a day (QID) | INTRAMUSCULAR | Status: DC
  Administered 2020-04-06 – 2020-04-07 (×6): 5 mg via INTRAVENOUS
  Filled 2020-04-06 (×6): qty 2

## 2020-04-06 MED ORDER — SODIUM CHLORIDE 0.9 % IV SOLN: Freq: Once | INTRAVENOUS | Status: AC

## 2020-04-06 MED ORDER — ONDANSETRON HCL 4 MG/2ML IJ SOLN
4.0000 mg | Freq: Four times a day (QID) | INTRAMUSCULAR | Status: DC
  Administered 2020-04-06: 4 mg via INTRAVENOUS
  Filled 2020-04-06: qty 2

## 2020-04-06 MED ORDER — ONDANSETRON HCL 4 MG/2ML IJ SOLN: 4.0000 mg | Freq: Four times a day (QID) | INTRAMUSCULAR | Status: DC | PRN

## 2020-04-06 MED ORDER — SODIUM CHLORIDE 0.9 % IV SOLN: INTRAVENOUS | Status: DC

## 2020-04-06 MED ORDER — PANTOPRAZOLE SODIUM 40 MG IV SOLR
40.0000 mg | INTRAVENOUS | Status: DC
  Administered 2020-04-06 – 2020-04-07 (×2): 40 mg via INTRAVENOUS
  Filled 2020-04-06 (×2): qty 40

## 2020-04-06 MED ORDER — MORPHINE SULFATE (PF) 2 MG/ML IV SOLN: 2.0000 mg | INTRAVENOUS | Status: DC | PRN

## 2020-04-06 MED ORDER — ENOXAPARIN SODIUM 40 MG/0.4ML ~~LOC~~ SOLN: 40.0000 mg | SUBCUTANEOUS | Status: DC

## 2020-04-06 MED ORDER — BOOST / RESOURCE BREEZE PO LIQD CUSTOM
1.0000 | Freq: Three times a day (TID) | ORAL | Status: DC
  Administered 2020-04-06: 1 via ORAL

## 2020-04-06 MED ORDER — METOCLOPRAMIDE HCL 5 MG/ML IJ SOLN
5.0000 mg | Freq: Once | INTRAMUSCULAR | Status: AC
  Administered 2020-04-06: 5 mg via INTRAVENOUS
  Filled 2020-04-06: qty 2

## 2020-04-06 MED ORDER — METOCLOPRAMIDE HCL 5 MG/ML IJ SOLN: 5.0000 mg | Freq: Four times a day (QID) | INTRAMUSCULAR | Status: DC | PRN

## 2020-04-06 NOTE — ED Notes (Signed)
Patient transported to CT 

## 2020-04-06 NOTE — Progress Notes (Signed)
   04/06/20 1537  Assess: MEWS Score  Temp 98 F (36.7 C)  BP 92/68  Pulse Rate (!) 109  Resp 18  Level of Consciousness Alert  SpO2 100 %  O2 Device Room Air  Assess: MEWS Score  MEWS Temp 0  MEWS Systolic 1  MEWS Pulse 1  MEWS RR 0  MEWS LOC 0  MEWS Score 2  MEWS Score Color Yellow  Assess: if the MEWS score is Yellow or Red  Were vital signs taken at a resting state? Yes  Focused Assessment No change from prior assessment  Early Detection of Sepsis Score *See Row Information* Low  MEWS guidelines implemented *See Row Information* Yes  Treat  Pain Scale 0-10  Pain Score 0  Take Vital Signs  Increase Vital Sign Frequency  Yellow: Q 2hr X 2 then Q 4hr X 2, if remains yellow, continue Q 4hrs  Escalate  MEWS: Escalate Yellow: discuss with charge nurse/RN and consider discussing with provider and RRT  Notify: Charge Nurse/RN  Name of Charge Nurse/RN Notified Yehuda Savannah RN  Date Charge Nurse/RN Notified 04/06/20  Time Charge Nurse/RN Notified 1555  Notify: Provider  Provider Name/Title Dr. Sherryll Burger  Date Provider Notified 04/06/20  Time Provider Notified 1557  Notification Type Page  Notification Reason Other (Comment) (yellow MEWS)

## 2020-04-06 NOTE — H&P (Addendum)
History and Physical  Jose Koch [id number] DOB: April 27, 1970 DOA: 04/05/2020  Referring physician: Benjiman CorePickering, Nathan, MD PCP: Patient, No Pcp Per  Patient coming from: Home  Chief Complaint: Vomiting  HPI: Jose Koch is a 50 y.o. male with medical history significant for T2DM, hyperlipidemia, hypertension, recent Covid 19 virus infection who presents to the emergency department due to 3 to 4 week onset of vomiting.  Patient endorsed several episodes of vomiting daily, he states that he usually sense a smell of smoke prior to vomiting, patient states that he feels lightheaded and sometimes have double vision and that he fell yesterday and hit the right frontal side of his head.  He has been unable to tolerate any oral intake due to recurrent vomiting.  [facility], blood work done at that time showed being positive for COVID-19 virus infection.  He endorsed having coffee-ground emesis per ED medical record.  Patient states that he has lost about 40 pounds within last year.  He also complains of abdominal muscle pain which he believed was due to recurrent vomiting.  He denies fever, chills, chest pain and shortness of breath.  ED Course:  In the emergency department, he was tachycardic, otherwise he was hemodynamically stable.  Work-up in the ED showed normal CBC except for thrombocytopenia, BUN to creatinine 20/1.57 (no prior labs for comparison). Total bilirubin 2.0, hyperglycemia. CT abdomen and pelvis with contrast showed splenomegaly with multiple hypodense soft tissue splenic lesions. Findings could represent a lymphoproliferative disorder. Bilateral lower lobe lung base findings suggestive of COVID-19 infection.  CT of head without contrast showed subcentimeter focus hyperattenuation in the white matter of the right parietal lobe. This is nonspecific but could reflect a small parenchymal hemorrhage/contusion IV Reglan and Phenergan were given.  IV  hydration of 1 L of NS was provided. Hospitalist was asked to admit patient for further evaluation and management.  Review of Systems: Constitutional: Positive for appetite change and weakness. Negative for chills and fever.  HENT: Negative for ear pain and sore throat.   Eyes: Negative for pain and visual disturbance.  Respiratory: Negative for cough, chest tightness and shortness of breath.   Cardiovascular: Negative for chest pain and palpitations.  Gastrointestinal: Positive for abdominal pain, nausea and vomiting.  Endocrine: Negative for polyphagia and polyuria.  Genitourinary: Negative for decreased urine volume, dysuria, enuresis Musculoskeletal: Negative for arthralgias and back pain.  Skin: Negative for color change and rash.  Allergic/Immunologic: Negative for immunocompromised state.  Neurological: Positive for headache, lightheadedness, weakness. Negative for tremors, syncope, speech difficulty Hematological: Does not bruise/bleed easily.  All other systems reviewed and are negative   Past Medical History:  Diagnosis Date  . COVID   . Diabetes mellitus without complication (HCC)   . High cholesterol   . Hypertension    History reviewed. No pertinent surgical history.  Social History:  has no history on file for tobacco use, alcohol use, and drug use.   No Known Allergies  History reviewed. No pertinent family history.   Prior to Admission medications   Medication Sig Start Date End Date Taking? Authorizing Provider  famotidine (PEPCID) 20 MG tablet Take 20 mg by mouth 2 (two) times daily. 03/18/20  Yes [provider]  ibuprofen (ADVIL) 200 MG tablet Take 200 mg by mouth every 6 (six) hours as needed for pain.   Yes [provider]  ondansetron (ZOFRAN-ODT) 4 MG disintegrating tablet Take 4 mg by mouth every 6 (six) hours as  needed. 04/01/20  Yes [provider]    Physical Exam: BP 112/76   Pulse 99   Temp 98.3 F (36.8 C)   Resp  18   Ht 5\' 8"  (1.727 m)   Wt 66.2 kg   SpO2 95%   BMI 22.18 kg/m   . General: 50 y.o. year-old male ill appearing but in no acute distress.  Alert and oriented x3. 57 HEENT: Dry mucous membrane.  Right periorbital ridge abrasion.  NCAT, EOMI . Neck: Supple, trachea medial . Cardiovascular: Regular rate and rhythm with no rubs or gallops.  No thyromegaly or JVD noted.  2/4 pulses in all 4 extremities. Marland Kitchen Respiratory: Clear to auscultation with no wheezes or rales. Good inspiratory effort. . Abdomen: Soft, mild tenderness to palpation. Nondistended with normal bowel sounds x4 quadrants. . Muskuloskeletal: No cyanosis, clubbing or edema noted bilaterally . Neuro: CN II-XII intact, strength, sensation, reflexes . Skin: No ulcerative lesions noted or rashes . Psychiatry: Judgement and insight appear normal. Mood is appropriate for condition and setting          Labs on Admission:  Basic Metabolic Panel: Recent Labs  Lab 04/05/20 2040 04/06/20 0458  NA 134* 135  K 4.0 3.6  CL 89* 97*  CO2 26 25  GLUCOSE 268* 183*  BUN 28* 23*  CREATININE 1.57* 1.29*  CALCIUM 9.6 8.6*  MG  --  2.2  PHOS  --  3.9   Liver Function Tests: Recent Labs  Lab 04/05/20 2040 04/06/20 0458  AST 23 17  ALT 27 22  ALKPHOS 104 88  BILITOT 2.0* 1.6*  PROT 7.7 6.7  ALBUMIN 4.5 3.8   Recent Labs  Lab 04/05/20 2040  LIPASE 44   No results for input(s): AMMONIA in the last 168 hours. CBC: Recent Labs  Lab 04/05/20 2040 04/06/20 0214  WBC 8.6 6.3  NEUTROABS 7.1  --   HGB 14.0 12.5*  HCT 43.0 38.2*  MCV 83.2 83.4  PLT 101* 90*   Cardiac Enzymes: No results for input(s): CKTOTAL, CKMB, CKMBINDEX, TROPONINI in the last 168 hours.  BNP (last 3 results) No results for input(s): BNP in the last 8760 hours.  ProBNP (last 3 results) No results for input(s): PROBNP in the last 8760 hours.  CBG: No results for input(s): GLUCAP in the last 168 hours.  Radiological Exams on Admission: CT Head  Wo Contrast  Result Date: 04/05/2020 CLINICAL DATA:  Nausea vomiting for 3 weeks. Positive COVID-19 test on 03/17/2020. 03/19/2020 today hitting his head. EXAM: CT HEAD WITHOUT CONTRAST TECHNIQUE: Contiguous axial images were obtained from the base of the skull through the vertex without intravenous contrast. COMPARISON:  None. FINDINGS: Brain: Subcentimeter focus of hyperattenuation in the right parietal lobe posterosuperior to the atrium of the right lateral ventricle. This may reflect a focus parenchymal hemorrhage/contusion. There is no other evidence of intracranial hemorrhage. Ventricles are normal in size and configuration. There are no parenchymal masses or mass effect. No evidence of an infarct. There are no extra-axial masses or abnormal fluid collections. Vascular: No hyperdense vessel or unexpected calcification. Skull: Normal. Negative for fracture or focal lesion. Sinuses/Orbits: Globes and orbits are unremarkable. Small amount of dependent fluid in the right maxillary sinus. Minor ethmoid sinus mucosal thickening. Aplastic frontal sinuses. Other: None. IMPRESSION: 1. Subcentimeter focus hyperattenuation in the white matter of the right parietal lobe. This is nonspecific but could reflect a small parenchymal hemorrhage/contusion. This could be further assessed with brain MRI without contrast or with  follow-up CT scans assess for stability or resolution. 2. No other intracranial abnormalities. 3. Small amount of dependent fluid in the right sphenoid sinus. Minor ethmoid sinus mucosal thickening. Electronically Signed   By: Amie Portland M.D.   On: 04/05/2020 22:20   CT ABDOMEN PELVIS W CONTRAST  Result Date: 04/05/2020 CLINICAL DATA:  Nausea and vomiting. X 3 weeks. Positive for COVID on 03/17/2020 EXAM: CT ABDOMEN AND PELVIS WITH CONTRAST TECHNIQUE: Multidetector CT imaging of the abdomen and pelvis was performed using the standard protocol following bolus administration of intravenous contrast.  CONTRAST:  OMNIPAQUE IOHEXOL 300 MG/ML  SOLN COMPARISON:  Chest x-ray 03/17/2020 FINDINGS: Lower chest: Vague ground-glass patchy airspace opacities within the visualized lung bases as well as linear atelectasis versus scarring within bilateral lower lobes. Hepatobiliary: The hepatic parenchyma is diffusely hypodense compared to the splenic parenchyma consistent with fatty infiltration. No focal liver abnormality. No gallstones, gallbladder wall thickening, or pericholecystic fluid. No biliary dilatation. Pancreas: No focal lesion. Normal pancreatic contour. No surrounding inflammatory changes. No main pancreatic ductal dilatation. Spleen: Enlarged spleen measuring up to 15 cm with multiple hypodense soft tissue density lesions with as an example a 2.3 cm lesion (2:30) as well as a 2.7 cm lesion (2:33). Suggestion of a splenule (5:43). Adrenals/Urinary Tract: No adrenal nodule bilaterally. Bilateral kidneys enhance symmetrically. No hydronephrosis. No hydroureter. The urinary bladder is unremarkable. Stomach/Bowel: Stomach is within normal limits. No evidence of bowel wall thickening or dilatation. Appendix appears normal. Vascular/Lymphatic: The portal, splenic, superior mesenteric veins are patent. No abdominal aorta or iliac aneurysm. At least moderate calcified and noncalcified atherosclerotic plaque of the aorta and its branches. No abdominal, pelvic, or inguinal lymphadenopathy. Reproductive: Prostate is unremarkable. Other: No intraperitoneal free fluid. No intraperitoneal free gas. No organized fluid collection. Musculoskeletal: No abdominal wall hernia or abnormality No suspicious lytic or blastic osseous lesions. No acute displaced fracture. IMPRESSION: 1. Splenomegaly with multiple hypodense soft tissue splenic lesions. Findings could represent a lymphoproliferative disorder. Consider PET-CT for further evaluation. 2. Bilateral lower lobe lung base findings suggestive of COVID-19 infection. 3. Mild  hepatic steatosis. 4.  Aortic Atherosclerosis (ICD10-I70.0). Electronically Signed   By: Tish Frederickson M.D.   On: 04/05/2020 22:22    EKG: I independently viewed the EKG done and my findings are as followed: Normal sinus rhythm at  a rate of 99 bpm with T wave inversion in leads III and aVF  Assessment/Plan Present on Admission: . Nausea & vomiting  Principal Problem:   Nausea & vomiting Active Problems:   Abdominal pain   COVID-19 virus infection   Dehydration   Thrombocytopenia (HCC)   Hyperglycemia due to diabetes mellitus (HCC)   Generalized weakness   AKI (acute kidney injury) (HCC)   Essential hypertension   Cerebral parenchymal hemorrhage (HCC)   Splenomegaly  Nausea, vomiting and abdominal pain secondary to multifactorial This could be due to COVID-19 gastroenteritis, diabetic gastroparesis or related to splenomegaly Continue IV Reglan as needed Continue IV morphine 2 mg every 4 hours as needed Continue n.p.o. at this time  Possible COVID-19 gastroenteritis Patient tested positive for COVID-19 on 1/24 [facility]) It has been more than 3 weeks since onset of patient's symptoms Patient is not hypoxic, continue IV Reglan as described above  Hyperglycemia secondary to T2DM r/o diabetic gastroparesis Continue ISS and hypoglycemic protocol Continue IV Reglan   Thrombocytopenia Platelets 101, continue to monitor with morning labs  Questionable hematemesis Patient endorsed coffee-ground emesis per ED medical record Occult  blood test will be obtained when patient vomits again  Acute kidney injury/dehydration BUN to creatinine 20/1.57 (no prior labs for comparison).   Continue IV hydration  Questionable cerebral parenchymal hemorrhage CT of head without contrast was suggestive of parenchymal hemorrhage/contusion Repeat CT of head without contrast will be done to assess for stability or resolution  Splenomegaly r/o lymphoproliferative  disorder CT abdomen and pelvis with contrast showed splenomegaly with multiple hypodense soft tissue splenic lesions PET-CT recommended for further evaluation  DVT prophylaxis: SCDs  Code Status: Full code  Family Communication: None at bedside  Disposition Plan:  Patient is from:                        home Anticipated DC to:                   SNF or family members home Anticipated DC date:               2-3 days Anticipated DC barriers:           Patient requires inpatient management of recurrent vomiting and requires IV hydration for dehydration   Consults called: None  Admission status: Inpatient    Frankey Shown MD [facility]  04/06/2020, 6:35 AM

## 2020-04-06 NOTE — Progress Notes (Signed)
Per HPI: Jose Koch is a 50 y.o. male with medical history significant for T2DM, hyperlipidemia, hypertension, recent Covid 19 virus infection who presents to the emergency department due to 3 to 4 week onset of vomiting.  Patient endorsed several episodes of vomiting daily, he states that he usually sense a smell of smoke prior to vomiting, patient states that he feels lightheaded and sometimes have double vision and that he fell yesterday and hit the right frontal side of his head.  He has been unable to tolerate any oral intake due to recurrent vomiting.  Patient was seen at [facility] on 1/24, blood work done at that time showed being positive for COVID-19 virus infection.  He endorsed having coffee-ground emesis per ED medical record.  Patient states that he has lost about 40 pounds within last year.  He also complains of abdominal muscle pain which he believed was due to recurrent vomiting.  He denies fever, chills, chest pain and shortness of breath.  ED Course:  In the emergency department, he was tachycardic, otherwise he was hemodynamically stable.  Work-up in the ED showed normal CBC except for thrombocytopenia, BUN to creatinine 20/1.57 (no prior labs for comparison). Total bilirubin 2.0, hyperglycemia. CT abdomen and pelvis with contrast showed splenomegaly with multiple hypodense soft tissue splenic lesions. Findings could represent a lymphoproliferative disorder. Bilateral lower lobe lung base findings suggestive of COVID-19 infection.  CT of head without contrast showed subcentimeter focus hyperattenuation in the white matter of the right parietal lobe. This is nonspecific but could reflect a small parenchymal hemorrhage/contusion IV Reglan and Phenergan were given.  IV hydration of 1 L of NS was provided. Hospitalist was asked to admit patient for further evaluation and management.  -Patient seen and evaluated this a.m. and was admitted after midnight.  Appreciate GI  consultation.  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 at this time Appreciate GI consultation Urine toxicology ordered and pending  Right parenchymal hemorrhage related to traumatic fall -Head CT on repeat with 7 x 7 mm finding of hemorrhage with no edema or midline shift -Discussed case with neurosurgery 2/13 with plans to follow-up outpatient, no need for further imaging, monitoring, or inpatient consultation unless neurological situation changes -Avoid heparin agents in the meantime  Hyperglycemia secondary to T2DM r/o diabetic gastroparesis Continue ISS and hypoglycemic protocol Continue IV Reglan   Thrombocytopenia Platelets 90, continue to monitor with morning labs  Questionable hematemesis Patient endorsed coffee-ground emesis per ED medical record Occult blood test will be obtained when patient vomits again  Acute kidney injury/dehydration-improving BUN to creatinine 20/1.57 (no prior labs for comparison).   Continue IV hydration  Splenomegaly r/o lymphoproliferative disorder CT abdomen and pelvis with contrast showed splenomegaly with multiple hypodense soft tissue splenic lesions PET-CT recommended for further evaluation  Total care time: 45 minutes.

## 2020-04-06 NOTE — Consult Note (Signed)
Referring Provider: No ref. provider found Primary Care Physician:  Patient, No Pcp Per Primary Gastroenterologist:  Dr. Jena Gauss  Reason for Consultation: Coffee-ground emesis; possible GI bleed nausea and vomiting   HPI: 50 year old gentleman seen in the ED at the request of Dr. Sherryll Burger to further evaluate reported coffee-ground emesis.  Patient complaining of  nausea and vomiting which started around January 15.  About this time, he was diagnosed with Covid.  He has had nearly incessant nausea and vomiting -  presented to an outside ED and urgent care for the symptoms. Given antiemetics along with Reglan.  Nothing has helped very much. Yesterday, he tells me that he has had worsening of "tunnel vision" and unsteadiness/poor balance; reports falling at home and hitting his head yesterday; unconscious for a brief period.  Presented to our ED.  Work-up included a head CT which revealed a probable small right parietal lobe brain bleed.  He has been stable in the ED overnight and repeat head CT is pending.  Patient states he did bring up a little blood in his vomitus as he reports.  Some coffee ground consistency recently.  No melena or hematochezia.  H&H on admission yesterday 14.0/ 43.0; decreased 12.5/38.2 today.  Platelet count 90,000 today. BUN 28/creatinine 1.57 yesterday; today 23/1.29, respectively. Total bilirubin yesterday and today 2.0/1.6, respectively.  Aminotransferases, alkaline phosphatase normal.  No bilirubin fractionation.  CT of the abdomen and pelvis this admission demonstrated splenomegaly,  multiple hypodense splenic lesions for which PET scan recommended. Basilar lung findings consistent with stigmata of Covid pneumonia.  Hepatic steatosis.  Of note, he has not been around anybody else sick with similar symptoms.   Historically, patient has occasional heartburn but has not had any problems with weight loss or early satiety, dysphagia, odynophagia melena, hematochezia,  constipation or diarrhea.  No prior GI illness or surgery.  Occasional ibuprofen use.  No illicit drugs or alcohol.  He has remained stable in the ED overnight.  He is received single dose of Reglan and started on Reglan 5 mg every 6 hours as needed, as needed Zofran and PPI.  Morphine IV as needed.  Past Medical History:  Diagnosis Date   COVID    Diabetes mellitus without complication (HCC)    High cholesterol    Hypertension     History reviewed. No pertinent surgical history.  Prior to Admission medications   Medication Sig Start Date End Date Taking? Authorizing Provider  famotidine (PEPCID) 20 MG tablet Take 20 mg by mouth 2 (two) times daily. 03/18/20  Yes [provider]  ibuprofen (ADVIL) 200 MG tablet Take 200 mg by mouth every 6 (six) hours as needed for pain.   Yes [provider]  ondansetron (ZOFRAN-ODT) 4 MG disintegrating tablet Take 4 mg by mouth every 6 (six) hours as needed. 04/01/20  Yes [provider]    Current Facility-Administered Medications  Medication Dose Route Frequency Provider Last Rate Last Admin   0.9 %  sodium chloride infusion   Intravenous Continuous Maurilio Lovely D, DO 75 mL/hr at 04/06/20 0929 Rate Verify at 04/06/20 0929   insulin aspart (novoLOG) injection 0-9 Units  0-9 Units Subcutaneous Q4H Adefeso, Oladapo, DO   2 Units at 04/06/20 0929   metoCLOPramide (REGLAN) injection 5 mg  5 mg Intravenous Q6H PRN Adefeso, Oladapo, DO       morphine 2 MG/ML injection 2 mg  2 mg Intravenous Q4H PRN Adefeso, Oladapo, DO       ondansetron (  ZOFRAN) injection 4 mg  4 mg Intravenous Q6H Shah, Pratik D, DO   4 mg at 04/06/20 0929   pantoprazole (PROTONIX) injection 40 mg  40 mg Intravenous Q24H Sherryll BurgerShah, Pratik D, DO   40 mg at 04/06/20 [id number]   Current Outpatient Medications  Medication Sig Dispense Refill   famotidine (PEPCID) 20 MG tablet Take 20 mg by mouth 2 (two) times daily.     ibuprofen (ADVIL) 200 MG tablet Take 200 mg by mouth  every 6 (six) hours as needed for pain.     ondansetron (ZOFRAN-ODT) 4 MG disintegrating tablet Take 4 mg by mouth every 6 (six) hours as needed.      Allergies as of 04/05/2020   (No Known Allergies)    History reviewed. No pertinent family history.  Social History   Socioeconomic History   Marital status: Single    Spouse name: Not on file   Number of children: Not on file   Years of education: Not on file   Highest education level: Not on file  Occupational History   Not on file  Tobacco Use   Smoking status: Not on file   Smokeless tobacco: Not on file  Substance and Sexual Activity   Alcohol use: Not on file   Drug use: Not on file   Sexual activity: Not on file  Other Topics Concern   Not on file  Social History Narrative   Not on file   Social Determinants of Health   Financial Resource Strain: Not on file  Food Insecurity: Not on file  Transportation Needs: Not on file  Physical Activity: Not on file  Stress: Not on file  Social Connections: Not on file  Intimate Partner Violence: Not on file    Review of Systems:  As in history of present illness  Physical Exam: Vital signs in last 24 hours: Temp:  [98.3 F (36.8 C)] 98.3 F (36.8 C) (02/12 1838) Pulse Rate:  [51-111] 92 (02/13 1000) Resp:  [7-22] 17 (02/13 0930) BP: (82-145)/(62-94) 120/78 (02/13 1000) SpO2:  [94 %-99 %] 96 % (02/13 1000) Weight:  [66.2 kg] 66.2 kg (02/12 1840)   General:   Alert,  pleasant and cooperative in NAD.  Subtle slurred speech noted. Head:  Normocephalic and atraumatic. Eyes:  Sclera clear, no icterus.   Conjunctiva pink. Lungs:  Clear throughout to auscultation.   No wheezes, crackles, or rhonchi. No acute distress. Heart:  Regular rate and rhythm; no murmurs, clicks, rubs,  or gallops. Abdomen:  Soft, nontender and nondistended. No masses, hepatosplenomegaly or hernias noted. Normal bowel sounds, without guarding, and without rebound.    Intake/Output from  previous day: 02/12 0701 - 02/13 0700 In: 1000 [IV Piggyback:1000] Out: -  Intake/Output this shift: Total I/O In: 130 [I.V.:130] Out: -   Lab Results: Recent Labs    04/05/20 2040 04/06/20 0214  WBC 8.6 6.3  HGB 14.0 12.5*  HCT 43.0 38.2*  PLT 101* 90*   BMET Recent Labs    04/05/20 2040 04/06/20 0458  NA 134* 135  K 4.0 3.6  CL 89* 97*  CO2 26 25  GLUCOSE 268* 183*  BUN 28* 23*  CREATININE 1.57* 1.29*  CALCIUM 9.6 8.6*   LFT Recent Labs    04/06/20 0458  PROT 6.7  ALBUMIN 3.8  AST 17  ALT 22  ALKPHOS 88  BILITOT 1.6*   PT/INR Recent Labs    04/06/20 0458  LABPROT 13.5  INR 1.1   Hepatitis Panel  No results for input(s): HEPBSAG, HCVAB, HEPAIGM, HEPBIGM in the last 72 hours. C-Diff No results for input(s): CDIFFTOX in the last 72 hours.  Studies/Results: CT Head Wo Contrast  Result Date: 04/05/2020 CLINICAL DATA:  Nausea vomiting for 3 weeks. Positive COVID-19 test on 03/17/2020. Larey Seat today hitting his head. EXAM: CT HEAD WITHOUT CONTRAST TECHNIQUE: Contiguous axial images were obtained from the base of the skull through the vertex without intravenous contrast. COMPARISON:  None. FINDINGS: Brain: Subcentimeter focus of hyperattenuation in the right parietal lobe posterosuperior to the atrium of the right lateral ventricle. This may reflect a focus parenchymal hemorrhage/contusion. There is no other evidence of intracranial hemorrhage. Ventricles are normal in size and configuration. There are no parenchymal masses or mass effect. No evidence of an infarct. There are no extra-axial masses or abnormal fluid collections. Vascular: No hyperdense vessel or unexpected calcification. Skull: Normal. Negative for fracture or focal lesion. Sinuses/Orbits: Globes and orbits are unremarkable. Small amount of dependent fluid in the right maxillary sinus. Minor ethmoid sinus mucosal thickening. Aplastic frontal sinuses. Other: None. IMPRESSION: 1. Subcentimeter focus  hyperattenuation in the white matter of the right parietal lobe. This is nonspecific but could reflect a small parenchymal hemorrhage/contusion. This could be further assessed with brain MRI without contrast or with follow-up CT scans assess for stability or resolution. 2. No other intracranial abnormalities. 3. Small amount of dependent fluid in the right sphenoid sinus. Minor ethmoid sinus mucosal thickening. Electronically Signed   By: Amie Portland M.D.   On: 04/05/2020 22:20   CT ABDOMEN PELVIS W CONTRAST  Result Date: 04/05/2020 CLINICAL DATA:  Nausea and vomiting. X 3 weeks. Positive for COVID on 03/17/2020 EXAM: CT ABDOMEN AND PELVIS WITH CONTRAST TECHNIQUE: Multidetector CT imaging of the abdomen and pelvis was performed using the standard protocol following bolus administration of intravenous contrast. CONTRAST:  OMNIPAQUE IOHEXOL 300 MG/ML  SOLN COMPARISON:  Chest x-ray 03/17/2020 FINDINGS: Lower chest: Vague ground-glass patchy airspace opacities within the visualized lung bases as well as linear atelectasis versus scarring within bilateral lower lobes. Hepatobiliary: The hepatic parenchyma is diffusely hypodense compared to the splenic parenchyma consistent with fatty infiltration. No focal liver abnormality. No gallstones, gallbladder wall thickening, or pericholecystic fluid. No biliary dilatation. Pancreas: No focal lesion. Normal pancreatic contour. No surrounding inflammatory changes. No main pancreatic ductal dilatation. Spleen: Enlarged spleen measuring up to 15 cm with multiple hypodense soft tissue density lesions with as an example a 2.3 cm lesion (2:30) as well as a 2.7 cm lesion (2:33). Suggestion of a splenule (5:43). Adrenals/Urinary Tract: No adrenal nodule bilaterally. Bilateral kidneys enhance symmetrically. No hydronephrosis. No hydroureter. The urinary bladder is unremarkable. Stomach/Bowel: Stomach is within normal limits. No evidence of bowel wall thickening or dilatation.  Appendix appears normal. Vascular/Lymphatic: The portal, splenic, superior mesenteric veins are patent. No abdominal aorta or iliac aneurysm. At least moderate calcified and noncalcified atherosclerotic plaque of the aorta and its branches. No abdominal, pelvic, or inguinal lymphadenopathy. Reproductive: Prostate is unremarkable. Other: No intraperitoneal free fluid. No intraperitoneal free gas. No organized fluid collection. Musculoskeletal: No abdominal wall hernia or abnormality No suspicious lytic or blastic osseous lesions. No acute displaced fracture. IMPRESSION: 1. Splenomegaly with multiple hypodense soft tissue splenic lesions. Findings could represent a lymphoproliferative disorder. Consider PET-CT for further evaluation. 2. Bilateral lower lobe lung base findings suggestive of COVID-19 infection. 3. Mild hepatic steatosis. 4.  Aortic Atherosclerosis (ICD10-I70.0). Electronically Signed   By: Tish Frederickson M.D.   On: 04/05/2020 22:22  Impression:  50 year old gentleman with 1 month history of rather recalcitrant nausea and vomiting at least temporally related to Covid infection. 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 Cannot rule out a primary CNS process, with abnormal head CT, as a primary contributing factor to nausea and vomiting. Less likely we are dealing with a primary diabetic gastroparesis.  I doubt a significant upper GI bleed at this time. Elevated BUN and creatinine-improved; I suspect more related to volume contraction and subsequent hydration rather than blood in the upper GI tract.  Elevated bilirubin; nonspecific.  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    Abnormal spleen on cross-sectional imaging, thrombocytopenia.  Likely an incidental finding for which further evaluation is needed.   Recommendations:  Focus treatment on breaking the cycle of nausea/vomiting  Agree with daily PPI and Reglan but lets make it scheduled 5 mg every 6  hours.  Supplement with Zofran.  Might consider cutaneous Scopolamine if needed.  Would hold off at this time so as to minimize potential for CNS side effects until abnormal head CT sorted out.  Hepatic function profile.  Pursue a PET scan as recommended by the radiologist when appropriate per attending.  Further recommendations to follow.           Notice:  This dictation was prepared with Dragon dictation along with smaller phrase technology. Any transcriptional errors that result from this process are unintentional and may not be corrected upon review.

## 2020-04-07 ENCOUNTER — Telehealth: Payer: Self-pay | Admitting: Gastroenterology

## 2020-04-07 ENCOUNTER — Encounter (HOSPITAL_COMMUNITY): Payer: Self-pay | Admitting: Internal Medicine

## 2020-04-07 DIAGNOSIS — R112 Nausea with vomiting, unspecified: Secondary | ICD-10-CM

## 2020-04-07 LAB — GLUCOSE, CAPILLARY
Glucose-Capillary: 101 mg/dL — ABNORMAL HIGH (ref 70–99)
Glucose-Capillary: 115 mg/dL — ABNORMAL HIGH (ref 70–99)
Glucose-Capillary: 128 mg/dL — ABNORMAL HIGH (ref 70–99)
Glucose-Capillary: 179 mg/dL — ABNORMAL HIGH (ref 70–99)
Glucose-Capillary: 184 mg/dL — ABNORMAL HIGH (ref 70–99)

## 2020-04-07 LAB — COMPREHENSIVE METABOLIC PANEL
ALT: 16 U/L (ref 0–44)
AST: 15 U/L (ref 15–41)
Albumin: 3.2 g/dL — ABNORMAL LOW (ref 3.5–5.0)
Alkaline Phosphatase: 72 U/L (ref 38–126)
Anion gap: 7 (ref 5–15)
BUN: 16 mg/dL (ref 6–20)
CO2: 27 mmol/L (ref 22–32)
Calcium: 8.2 mg/dL — ABNORMAL LOW (ref 8.9–10.3)
Chloride: 102 mmol/L (ref 98–111)
Creatinine, Ser: 1.22 mg/dL (ref 0.61–1.24)
GFR, Estimated: >60 mL/min (ref >60)
Glucose, Bld: 109 mg/dL — ABNORMAL HIGH (ref 70–99)
Potassium: 3 mmol/L — ABNORMAL LOW (ref 3.5–5.1)
Sodium: 136 mmol/L (ref 135–145)
Total Bilirubin: 1 mg/dL (ref 0.3–1.2)
Total Protein: 5.7 g/dL — ABNORMAL LOW (ref 6.5–8.1)

## 2020-04-07 LAB — CBC
HCT: 32.6 % — ABNORMAL LOW (ref 39.0–52.0)
Hemoglobin: 10.3 g/dL — ABNORMAL LOW (ref 13.0–17.0)
MCH: 26.8 pg (ref 26.0–34.0)
MCHC: 31.6 g/dL (ref 30.0–36.0)
MCV: 84.7 fL (ref 80.0–100.0)
Platelets: 56 10*3/uL — ABNORMAL LOW (ref 150–400)
RBC: 3.85 MIL/uL — ABNORMAL LOW (ref 4.22–5.81)
RDW: 13.8 % (ref 11.5–15.5)
WBC: 4.1 10*3/uL (ref 4.0–10.5)
nRBC: 0 % (ref 0.0–0.2)

## 2020-04-07 LAB — MAGNESIUM: Magnesium: 2 mg/dL (ref 1.7–2.4)

## 2020-04-07 MED ORDER — PANTOPRAZOLE SODIUM 40 MG PO TBEC: 40.0000 mg | DELAYED_RELEASE_TABLET | Freq: Every day | ORAL | 1 refills | Status: DC

## 2020-04-07 MED ORDER — INSULIN ASPART 100 UNIT/ML ~~LOC~~ SOLN: 0.0000 [IU] | Freq: Every day | SUBCUTANEOUS | Status: DC

## 2020-04-07 MED ORDER — INSULIN PEN NEEDLE 31G X 5 MM MISC: 1.0000 [IU] | Freq: Every day | 1 refills | Status: AC

## 2020-04-07 MED ORDER — METFORMIN HCL ER 500 MG PO TB24: 500.0000 mg | ORAL_TABLET | Freq: Every day | ORAL | 11 refills | Status: DC

## 2020-04-07 MED ORDER — METOCLOPRAMIDE HCL 10 MG PO TABS: 10.0000 mg | ORAL_TABLET | Freq: Three times a day (TID) | ORAL | 1 refills | Status: DC

## 2020-04-07 MED ORDER — POTASSIUM CHLORIDE CRYS ER 20 MEQ PO TBCR
40.0000 meq | EXTENDED_RELEASE_TABLET | Freq: Two times a day (BID) | ORAL | Status: DC
  Administered 2020-04-07: 40 meq via ORAL
  Filled 2020-04-07: qty 2

## 2020-04-07 MED ORDER — INSULIN GLARGINE 100 UNIT/ML SOLOSTAR PEN: 20.0000 [IU] | PEN_INJECTOR | Freq: Every day | SUBCUTANEOUS | 11 refills | Status: DC

## 2020-04-07 MED ORDER — BLOOD GLUCOSE MONITOR KIT: PACK | 0 refills | Status: DC

## 2020-04-07 MED ORDER — INSULIN ASPART 100 UNIT/ML ~~LOC~~ SOLN
0.0000 [IU] | Freq: Three times a day (TID) | SUBCUTANEOUS | Status: DC
  Administered 2020-04-07 (×2): 2 [IU] via SUBCUTANEOUS

## 2020-04-07 NOTE — Progress Notes (Signed)
Subjective: Denies abdominal pain, N/V, overt GI bleeding. Tolerating diet. Ate 75% of breakfast. Diet advanced for lunch.   Objective: Vital signs in last 24 hours: Temp:  [97.8 F (36.6 C)-98.7 F (37.1 C)] 98.3 F (36.8 C) (02/14 0520) Pulse Rate:  [76-109] 76 (02/14 0520) Resp:  [12-20] 18 (02/14 0520) BP: (92-135)/(66-94) 117/81 (02/14 0520) SpO2:  [95 %-100 %] 98 % (02/14 0520) Last BM Date: 04/06/20 General:   Alert and oriented, pleasant Head:  Normocephalic and atraumatic. Abdomen:  Bowel sounds present, soft, non-tender, non-distended.  Extremities:  Without  edema. Neurologic:  Alert and  oriented x4  Intake/Output from previous day: 02/13 0701 - 02/14 0700 In: 543.8 [I.V.:543.8] Out: -  Intake/Output this shift: No intake/output data recorded.  Lab Results: Recent Labs    04/05/20 2040 04/06/20 0214 04/07/20 0509  WBC 8.6 6.3 4.1  HGB 14.0 12.5* 10.3*  HCT 43.0 38.2* 32.6*  PLT 101* 90* 56*   BMET Recent Labs    04/05/20 2040 04/06/20 0458 04/07/20 0509  NA 134* 135 136  K 4.0 3.6 3.0*  CL 89* 97* 102  CO2 26 25 27   GLUCOSE 268* 183* 109*  BUN 28* 23* 16  CREATININE 1.57* 1.29* 1.22  CALCIUM 9.6 8.6* 8.2*   LFT Recent Labs    04/05/20 2040 04/06/20 0458 04/07/20 0509  PROT 7.7 6.7 5.7*  ALBUMIN 4.5 3.8 3.2*  AST 23 17 15   ALT 27 22 16   ALKPHOS 104 88 72  BILITOT 2.0* 1.6* 1.0   PT/INR Recent Labs    04/06/20 0458  LABPROT 13.5  INR 1.1    Studies/Results: CT HEAD WO CONTRAST  Result Date: 04/06/2020 CLINICAL DATA:  Parenchymal hemorrhage EXAM: CT HEAD WITHOUT CONTRAST TECHNIQUE: Contiguous axial images were obtained from the base of the skull through the vertex without intravenous contrast. COMPARISON:  April 05, 2020 FINDINGS: Brain: Ventricles and sulci are normal in size and configuration. A focal area of hemorrhage slightly posterior to the superior atrium right lateral ventricle is unchanged in size and contour.  This focus of hemorrhage is best seen on sagittal image, and measures 7 x 7 mm. There is no surrounding edema in this area. No new focus of hemorrhage is evident. There is no mass, extra-axial fluid collection, or midline shift. Ventricles and sulci normal in size and configuration. No evident acute infarct. Vascular: No hyperdense vessel. There is slight calcification in each carotid siphon region. Skull: Bony calvarium appears intact. Sinuses/Orbits: Mucosal thickening noted in each maxillary antrum. Orbits appear symmetric bilaterally. Other: Mastoid air cells are clear. There is debris in each external auditory canal. IMPRESSION: Stable 7 x 7 mm focus of hemorrhage in the posterosuperior periventricular white matter on the right. No new or enlarging focus of hemorrhage. No surrounding edema at this site of subcentimeter hemorrhage. Brain parenchyma otherwise appears normal. No extra-axial fluid or midline shift. Slight calcification noted in each carotid siphon region. Maxillary sinus disease noted. Electronically Signed   By: 04/08/20 III M.D.   On: 04/06/2020 12:29   CT Head Wo Contrast  Result Date: 04/05/2020 CLINICAL DATA:  Nausea vomiting for 3 weeks. Positive COVID-19 test on 03/17/2020. 04/08/2020 today hitting his head. EXAM: CT HEAD WITHOUT CONTRAST TECHNIQUE: Contiguous axial images were obtained from the base of the skull through the vertex without intravenous contrast. COMPARISON:  None. FINDINGS: Brain: Subcentimeter focus of hyperattenuation in the right parietal lobe posterosuperior to the atrium of the right lateral ventricle. This  may reflect a focus parenchymal hemorrhage/contusion. There is no other evidence of intracranial hemorrhage. Ventricles are normal in size and configuration. There are no parenchymal masses or mass effect. No evidence of an infarct. There are no extra-axial masses or abnormal fluid collections. Vascular: No hyperdense vessel or unexpected calcification. Skull:  Normal. Negative for fracture or focal lesion. Sinuses/Orbits: Globes and orbits are unremarkable. Small amount of dependent fluid in the right maxillary sinus. Minor ethmoid sinus mucosal thickening. Aplastic frontal sinuses. Other: None. IMPRESSION: 1. Subcentimeter focus hyperattenuation in the white matter of the right parietal lobe. This is nonspecific but could reflect a small parenchymal hemorrhage/contusion. This could be further assessed with brain MRI without contrast or with follow-up CT scans assess for stability or resolution. 2. No other intracranial abnormalities. 3. Small amount of dependent fluid in the right sphenoid sinus. Minor ethmoid sinus mucosal thickening. Electronically Signed   By: Amie Portland M.D.   On: 04/05/2020 22:20   CT ABDOMEN PELVIS W CONTRAST  Result Date: 04/05/2020 CLINICAL DATA:  Nausea and vomiting. X 3 weeks. Positive for COVID on 03/17/2020 EXAM: CT ABDOMEN AND PELVIS WITH CONTRAST TECHNIQUE: Multidetector CT imaging of the abdomen and pelvis was performed using the standard protocol following bolus administration of intravenous contrast. CONTRAST:  OMNIPAQUE IOHEXOL 300 MG/ML  SOLN COMPARISON:  Chest x-ray 03/17/2020 FINDINGS: Lower chest: Vague ground-glass patchy airspace opacities within the visualized lung bases as well as linear atelectasis versus scarring within bilateral lower lobes. Hepatobiliary: The hepatic parenchyma is diffusely hypodense compared to the splenic parenchyma consistent with fatty infiltration. No focal liver abnormality. No gallstones, gallbladder wall thickening, or pericholecystic fluid. No biliary dilatation. Pancreas: No focal lesion. Normal pancreatic contour. No surrounding inflammatory changes. No main pancreatic ductal dilatation. Spleen: Enlarged spleen measuring up to 15 cm with multiple hypodense soft tissue density lesions with as an example a 2.3 cm lesion (2:30) as well as a 2.7 cm lesion (2:33). Suggestion of a splenule  (5:43). Adrenals/Urinary Tract: No adrenal nodule bilaterally. Bilateral kidneys enhance symmetrically. No hydronephrosis. No hydroureter. The urinary bladder is unremarkable. Stomach/Bowel: Stomach is within normal limits. No evidence of bowel wall thickening or dilatation. Appendix appears normal. Vascular/Lymphatic: The portal, splenic, superior mesenteric veins are patent. No abdominal aorta or iliac aneurysm. At least moderate calcified and noncalcified atherosclerotic plaque of the aorta and its branches. No abdominal, pelvic, or inguinal lymphadenopathy. Reproductive: Prostate is unremarkable. Other: No intraperitoneal free fluid. No intraperitoneal free gas. No organized fluid collection. Musculoskeletal: No abdominal wall hernia or abnormality No suspicious lytic or blastic osseous lesions. No acute displaced fracture. IMPRESSION: 1. Splenomegaly with multiple hypodense soft tissue splenic lesions. Findings could represent a lymphoproliferative disorder. Consider PET-CT for further evaluation. 2. Bilateral lower lobe lung base findings suggestive of COVID-19 infection. 3. Mild hepatic steatosis. 4.  Aortic Atherosclerosis (ICD10-I70.0). Electronically Signed   By: Tish Frederickson M.D.   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 Concern for coffee-ground emesis on admission. Hgb 14 on admission and 10.3 today, but he remains without overt signs of bleeding. Clinically improved, tolerating clears and will be advanced to regular diet at lunch.   Elevated bilirubin: now resolved. Possibly related to Gilbert's.   Abnormal spleen on CT: splenomegaly and multiple hypodense splenic lesions with recommendations for PET scan. Thrombocytopenia also noted. PET per attending.   Right parenchymal hemorrhage after fall: remains neuro  intact. Repeat CT head without contrast with stable  findings. Follow-up with Neuro as outpatient.    Plan: Continue Protonix daily Continue Reglan scheduled for now Follow as outpatient and arrange EGD PET per attending   Gelene Mink, PhD, ANP-BC [facility]     LOS: 1 day    04/07/2020, 10:19 AM

## 2020-04-07 NOTE — TOC Progression Note (Signed)
Transition of Care [facility]) - Progression Note    Patient Details  Name: North Esterline MRN: [id number] Date of Birth: June 12, 1970  Transition of [facility]) CM/SW Contact  Karn Cassis, [place] Phone Number: 04/07/2020, 12:40 PM  Clinical Narrative: LCSW followed up with pt to make sure he has PCP. Pt states he sees Dr. Tillie Fantasia in [place]. Pt aware he will need PCP follow up and plans to schedule appointment. No other needs reported at this time.            Expected Discharge Plan and Services                                                 Social Determinants of Health (SDOH) Interventions    Readmission Risk Interventions No flowsheet data found.

## 2020-04-07 NOTE — Discharge Summary (Signed)
Physician Discharge Summary  Destiny Trickey IPJ:[id number] DOB: 1970-08-05 DOA: 04/05/2020  PCP: Patient, No Pcp Per  Admit date: 04/05/2020  Discharge date: 04/07/2020  Admitted From:Home  Disposition:  Home  Recommendations for Outpatient Follow-up:  1. Follow up with PCP in 1-2 weeks 2. Get back on Metformin and Lantus as prescribed below, patient has not been taking these medications in the recent past 3. Encouraged compliance with diabetic diet as well as blood glucose monitoring. 4. Remain on Reglan 3 times daily as well as PPI daily and follow-up with GI as scheduled 5. Referral sent to endocrinology for further follow-up and evaluation of very poorly controlled type 2 diabetes with noted A1c of 12% 6. Follow-up with neurosurgery outpatient to evaluate intracranial bleed; referral sent  Home Health: None  Equipment/Devices: None  Discharge Condition:Stable  CODE STATUS: Full  Diet recommendation: Heart Healthy/carb modified  Brief/Interim Summary: Tiney Rouge a 50 y.o.malewith medical history significant forT2DM, hyperlipidemia, hypertension, recent Covid 19 virus infection who presents to the emergency department due to 3 to 4 week onset of vomiting. Patient endorsed several episodes of vomiting daily, he states that he usually sense a smell of smoke prior to vomiting, patient states that he feels lightheaded and sometimes have double vision and that hefell yesterday and hitthe right frontal side of his head. He has been unable to tolerate any oral intake due to recurrent vomiting. Patient was seen at [facility] on 1/24, blood work done at that time showed being positive for COVID-19 virus infection. He endorsed having coffee-ground emesis per ED medical record. Patient states that he has lost about 40 pounds within last year. He also complains of abdominal muscle pain which he believed was due to recurrent vomiting. He denies fever, chills, chest pain and  shortness of breath.  ED Course: In the emergency department, he was tachycardic, otherwise he was hemodynamically stable. Work-up in the ED showed normal CBC except for thrombocytopenia, BUN to creatinine 20/1.57 (no prior labs for comparison). Total bilirubin 2.0, hyperglycemia. CT abdomen and pelvis with contrast showed splenomegaly withmultiple hypodense soft tissue splenic lesions. Findings could represent a lymphoproliferative disorder.Bilateral lower lobe lung base findings suggestive of COVID-19 infection. CT of head without contrast showed subcentimeter focus hyperattenuationin the white matter of the right parietal lobe. This is nonspecific but could reflect a small parenchymal hemorrhage/contusion IV Reglan and Phenergan were given. IV hydration of 1 L of NS was provided. Hospitalist was asked to admit patient for further evaluation and management.  -Patient was admitted with nausea, vomiting, and abdominal pain with significant associated weight loss in the setting of post viral syndrome gastroparesis.  He was noted to have recent COVID-19 pneumonia and began having symptoms shortly after that.  Does continue to persist and he was noted to have hemoglobin A1c of 12% and on chart review he has been noted to have very poor glucose control now for several years and has not been taking his medications.  He was started on scheduled Reglan which greatly improved his symptoms.  He has no further nausea, vomiting, abdominal pain noted and he is able to tolerate his diet quite well.  He was seen in consultation with gastroenterology who plans to follow-up with him outpatient.  He was additionally noted to have an intracranial bleed with right parenchymal hemorrhage that was discussed with neurosurgery with no need for operative intervention.  Repeat CT scan demonstrated stability and they will follow-up outpatient.  He has been prescribed Reglan 3 times daily with  meals as well as PPI daily.   He has been represcribed his home Lantus as well as Metformin to continue taking along with blood glucose monitoring which she does not have.  No other acute events or concerns otherwise noted throughout the course of this admission.  He is stable for discharge.  Discharge Diagnoses:  Principal Problem:   Nausea & vomiting Active Problems:   Abdominal pain   COVID-19 virus infection   Dehydration   Thrombocytopenia (HCC)   Hyperglycemia due to diabetes mellitus (HCC)   Generalized weakness   AKI (acute kidney injury) ([place])   Essential hypertension   Cerebral parenchymal hemorrhage (HCC)   Splenomegaly  Principal discharge diagnosis: Abdominal symptoms related to post viral syndrome gastroparesis.  Right parenchymal hemorrhage related to traumatic fall-stable.  Discharge Instructions  Discharge Instructions    Ambulatory referral to Endocrinology   Complete by: As directed    Ambulatory referral to Gastroenterology   Complete by: As directed    Diet - low sodium heart healthy   Complete by: As directed    Diet - low sodium heart healthy   Complete by: As directed    Increase activity slowly   Complete by: As directed    Increase activity slowly   Complete by: As directed      Allergies as of 04/07/2020   No Known Allergies     Medication List    STOP taking these medications   famotidine 20 MG tablet Commonly known as: PEPCID   ibuprofen 200 MG tablet Commonly known as: ADVIL   ondansetron 4 MG disintegrating tablet Commonly known as: ZOFRAN-ODT     TAKE these medications   blood glucose meter kit and supplies Kit Dispense based on patient and insurance preference. Use up to four times daily as directed. (FOR ICD-9 250.00, 250.01).   insulin glargine 100 UNIT/ML Solostar Pen Commonly known as: LANTUS Inject 20 Units into the skin daily.   Insulin Pen Needle 31G X 5 MM Misc 1 Units by Does not apply route daily.   metFORMIN 500 MG 24 hr tablet Commonly  known as: Glucophage XR Take 1 tablet (500 mg total) by mouth daily with breakfast.   metoCLOPramide 10 MG tablet Commonly known as: REGLAN Take 1 tablet (10 mg total) by mouth 3 (three) times daily with meals.   pantoprazole 40 MG tablet Commonly known as: Protonix Take 1 tablet (40 mg total) by mouth daily.       Follow-up Information    pcp. Schedule an appointment as soon as possible for a visit in 2 week(s).        [facility]. Schedule an appointment as soon as possible for a visit in 2 week(s).   Contact information: [address] [place] [phone]             No Known Allergies  Consultations:  GI  Discussed case with neurosurgery 2/13   Procedures/Studies: CT HEAD WO CONTRAST  Result Date: 04/06/2020 CLINICAL DATA:  Parenchymal hemorrhage EXAM: CT HEAD WITHOUT CONTRAST TECHNIQUE: Contiguous axial images were obtained from the base of the skull through the vertex without intravenous contrast. COMPARISON:  April 05, 2020 FINDINGS: Brain: Ventricles and sulci are normal in size and configuration. A focal area of hemorrhage slightly posterior to the superior atrium right lateral ventricle is unchanged in size and contour. This focus of hemorrhage is best seen on sagittal image, and measures 7 x 7 mm. There is no surrounding edema in  this area. No new focus of hemorrhage is evident. There is no mass, extra-axial fluid collection, or midline shift. Ventricles and sulci normal in size and configuration. No evident acute infarct. Vascular: No hyperdense vessel. There is slight calcification in each carotid siphon region. Skull: Bony calvarium appears intact. Sinuses/Orbits: Mucosal thickening noted in each maxillary antrum. Orbits appear symmetric bilaterally. Other: Mastoid air cells are clear. There is debris in each external auditory canal. IMPRESSION: Stable 7 x 7 mm focus of hemorrhage in the posterosuperior  periventricular white matter on the right. No new or enlarging focus of hemorrhage. No surrounding edema at this site of subcentimeter hemorrhage. Brain parenchyma otherwise appears normal. No extra-axial fluid or midline shift. Slight calcification noted in each carotid siphon region. Maxillary sinus disease noted. Electronically Signed   By: Lowella Grip III M.D.   On: 04/06/2020 12:29   CT Head Wo Contrast  Result Date: 04/05/2020 CLINICAL DATA:  Nausea vomiting for 3 weeks. Positive COVID-19 test on 03/17/2020. [place] today hitting his head. EXAM: CT HEAD WITHOUT CONTRAST TECHNIQUE: Contiguous axial images were obtained from the base of the skull through the vertex without intravenous contrast. COMPARISON:  None. FINDINGS: Brain: Subcentimeter focus of hyperattenuation in the right parietal lobe posterosuperior to the atrium of the right lateral ventricle. This may reflect a focus parenchymal hemorrhage/contusion. There is no other evidence of intracranial hemorrhage. Ventricles are normal in size and configuration. There are no parenchymal masses or mass effect. No evidence of an infarct. There are no extra-axial masses or abnormal fluid collections. Vascular: No hyperdense vessel or unexpected calcification. Skull: Normal. Negative for fracture or focal lesion. Sinuses/Orbits: Globes and orbits are unremarkable. Small amount of dependent fluid in the right maxillary sinus. Minor ethmoid sinus mucosal thickening. Aplastic frontal sinuses. Other: None. IMPRESSION: 1. Subcentimeter focus hyperattenuation in the white matter of the right parietal lobe. This is nonspecific but could reflect a small parenchymal hemorrhage/contusion. This could be further assessed with brain MRI without contrast or with follow-up CT scans assess for stability or resolution. 2. No other intracranial abnormalities. 3. Small amount of dependent fluid in the right sphenoid sinus. Minor ethmoid sinus mucosal thickening.  Electronically Signed   By: Lajean Manes M.D.   On: 04/05/2020 22:20   CT ABDOMEN PELVIS W CONTRAST  Result Date: 04/05/2020 CLINICAL DATA:  Nausea and vomiting. X 3 weeks. Positive for COVID on 03/17/2020 EXAM: CT ABDOMEN AND PELVIS WITH CONTRAST TECHNIQUE: Multidetector CT imaging of the abdomen and pelvis was performed using the standard protocol following bolus administration of intravenous contrast. CONTRAST:  168m OMNIPAQUE IOHEXOL 300 MG/ML  SOLN COMPARISON:  Chest x-ray 03/17/2020 FINDINGS: Lower chest: Vague ground-glass patchy airspace opacities within the visualized lung bases as well as linear atelectasis versus scarring within bilateral lower lobes. Hepatobiliary: The hepatic parenchyma is diffusely hypodense compared to the splenic parenchyma consistent with fatty infiltration. No focal liver abnormality. No gallstones, gallbladder wall thickening, or pericholecystic fluid. No biliary dilatation. Pancreas: No focal lesion. Normal pancreatic contour. No surrounding inflammatory changes. No main pancreatic ductal dilatation. Spleen: Enlarged spleen measuring up to 15 cm with multiple hypodense soft tissue density lesions with as an example a 2.3 cm lesion (2:30) as well as a 2.7 cm lesion (2:33). Suggestion of a splenule (5:43). Adrenals/Urinary Tract: No adrenal nodule bilaterally. Bilateral kidneys enhance symmetrically. No hydronephrosis. No hydroureter. The urinary bladder is unremarkable. Stomach/Bowel: Stomach is within normal limits. No evidence of bowel wall thickening or dilatation. Appendix appears normal. Vascular/Lymphatic: The  portal, splenic, superior mesenteric veins are patent. No abdominal aorta or iliac aneurysm. At least moderate calcified and noncalcified atherosclerotic plaque of the aorta and its branches. No abdominal, pelvic, or inguinal lymphadenopathy. Reproductive: Prostate is unremarkable. Other: No intraperitoneal free fluid. No intraperitoneal free gas. No organized  fluid collection. Musculoskeletal: No abdominal wall hernia or abnormality No suspicious lytic or blastic osseous lesions. No acute displaced fracture. IMPRESSION: 1. Splenomegaly with multiple hypodense soft tissue splenic lesions. Findings could represent a lymphoproliferative disorder. Consider PET-CT for further evaluation. 2. Bilateral lower lobe lung base findings suggestive of COVID-19 infection. 3. Mild hepatic steatosis. 4.  Aortic Atherosclerosis (ICD10-I70.0). Electronically Signed   By: Iven Finn M.D.   On: 04/05/2020 22:22      Discharge Exam: Vitals:   04/07/20 0520 04/07/20 1301  BP: 117/81 103/65  Pulse: 76 74  Resp: 18 18  Temp: 98.3 F (36.8 C) 98.1 F (36.7 C)  SpO2: 98% 99%   Vitals:   04/06/20 1715 04/06/20 2137 04/07/20 0520 04/07/20 1301  BP: 114/75 132/90 117/81 103/65  Pulse: 79 86 76 74  Resp: 16 18 18 18   Temp: 98.7 F (37.1 C) 97.8 F (36.6 C) 98.3 F (36.8 C) 98.1 F (36.7 C)  TempSrc: Oral Oral Oral   SpO2: 99% 99% 98% 99%  Weight:      Height:        General: Pt is alert, awake, not in acute distress Cardiovascular: RRR, S1/S2 +, no rubs, no gallops Respiratory: CTA bilaterally, no wheezing, no rhonchi Abdominal: Soft, NT, ND, bowel sounds + Extremities: no edema, no cyanosis    The results of significant diagnostics from this hospitalization (including imaging, microbiology, ancillary and laboratory) are listed below for reference.     Microbiology: No results found for this or any previous visit (from the past 240 hour(s)).   Labs: BNP (last 3 results) No results for input(s): BNP in the last 8760 hours. Basic Metabolic Panel: Recent Labs  Lab 04/05/20 2040 04/06/20 0458 04/07/20 0509  NA 134* 135 136  K 4.0 3.6 3.0*  CL 89* 97* 102  CO2 26 25 27   GLUCOSE 268* 183* 109*  BUN 28* 23* 16  CREATININE 1.57* 1.29* 1.22  CALCIUM 9.6 8.6* 8.2*  MG  --  2.2 2.0  PHOS  --  3.9  --    Liver Function Tests: Recent Labs   Lab 04/05/20 2040 04/06/20 0458 04/07/20 0509  AST 23 17 15   ALT 27 22 16   ALKPHOS 104 88 72  BILITOT 2.0* 1.6* 1.0  PROT 7.7 6.7 5.7*  ALBUMIN 4.5 3.8 3.2*   Recent Labs  Lab 04/05/20 2040  LIPASE 44   No results for input(s): AMMONIA in the last 168 hours. CBC: Recent Labs  Lab 04/05/20 2040 04/06/20 0214 04/07/20 0509  WBC 8.6 6.3 4.1  NEUTROABS 7.1  --   --   HGB 14.0 12.5* 10.3*  HCT 43.0 38.2* 32.6*  MCV 83.2 83.4 84.7  PLT 101* 90* 56*   Cardiac Enzymes: No results for input(s): CKTOTAL, CKMB, CKMBINDEX, TROPONINI in the last 168 hours. BNP: Invalid input(s): POCBNP CBG: Recent Labs  Lab 04/06/20 2035 04/07/20 0015 04/07/20 0405 04/07/20 0730 04/07/20 1110  GLUCAP 152* 128* 101* 115* 179*   D-Dimer No results for input(s): DDIMER in the last 72 hours. Hgb A1c Recent Labs    04/06/20 0459  HGBA1C 12.0*   Lipid Profile No results for input(s): CHOL, HDL, LDLCALC, TRIG, CHOLHDL, LDLDIRECT in  the last 72 hours. Thyroid function studies No results for input(s): TSH, T4TOTAL, T3FREE, THYROIDAB in the last 72 hours.  Invalid input(s): FREET3 Anemia work up No results for input(s): VITAMINB12, FOLATE, FERRITIN, TIBC, IRON, RETICCTPCT in the last 72 hours. Urinalysis    Component Value Date/Time   COLORURINE YELLOW 04/06/2020 1917   APPEARANCEUR CLEAR 04/06/2020 1917   LABSPEC 1.025 04/06/2020 1917   PHURINE 5.0 04/06/2020 1917   GLUCOSEU >=500 (A) 04/06/2020 [place] 04/06/2020 [place] NEGATIVE 04/06/2020 1917   KETONESUR 20 (A) 04/06/2020 1917   PROTEINUR 30 (A) 04/06/2020 1917   NITRITE NEGATIVE 04/06/2020 1917   LEUKOCYTESUR NEGATIVE 04/06/2020 1917   Sepsis Labs Invalid input(s): PROCALCITONIN,  WBC,  LACTICIDVEN Microbiology No results found for this or any previous visit (from the past 240 hour(s)).   Time coordinating discharge: 35 minutes  SIGNED:   Rodena Goldmann, DO [facility] 04/07/2020,  1:56 PM  If 7PM-7AM, please contact night-coverage [email or web address]

## 2020-04-07 NOTE — Telephone Encounter (Signed)
Jose Koch, patient needs hospital follow-up in 2-4 weeks.   Alicia: please have patient complete CBC end of week (Thursday or Friday).

## 2020-04-08 ENCOUNTER — Encounter: Payer: Self-pay | Admitting: Internal Medicine

## 2020-04-08 DIAGNOSIS — Z789 Other specified health status: Secondary | ICD-10-CM | POA: Insufficient documentation

## 2020-04-09 ENCOUNTER — Other Ambulatory Visit: Payer: Self-pay

## 2020-04-09 DIAGNOSIS — D649 Anemia, unspecified: Secondary | ICD-10-CM

## 2020-04-09 NOTE — Telephone Encounter (Signed)
Noted. Pt is going to have labs completed on Friday 04/11/20 @ [facility].

## 2020-04-14 ENCOUNTER — Other Ambulatory Visit: Payer: Self-pay

## 2020-04-14 ENCOUNTER — Encounter: Payer: Self-pay | Admitting: Nurse Practitioner

## 2020-04-14 ENCOUNTER — Other Ambulatory Visit (HOSPITAL_COMMUNITY)
Admission: RE | Admit: 2020-04-14 | Discharge: 2020-04-14 | Disposition: A | Discharge status: Home / Self Care | Payer: Self-pay | Source: Ambulatory Visit | Attending: Nurse Practitioner | Admitting: Nurse Practitioner

## 2020-04-14 ENCOUNTER — Ambulatory Visit (INDEPENDENT_AMBULATORY_CARE_PROVIDER_SITE_OTHER): Payer: Self-pay | Admitting: Nurse Practitioner

## 2020-04-14 VITALS — BP 107/70 | HR 105 | Ht 68.0 in | Wt 167.4 lb

## 2020-04-14 DIAGNOSIS — E782 Mixed hyperlipidemia: Secondary | ICD-10-CM

## 2020-04-14 DIAGNOSIS — E1165 Type 2 diabetes mellitus with hyperglycemia: Secondary | ICD-10-CM

## 2020-04-14 DIAGNOSIS — I10 Essential (primary) hypertension: Secondary | ICD-10-CM

## 2020-04-14 LAB — CBC WITH DIFFERENTIAL/PLATELET
Abs Immature Granulocytes: 0.03 10*3/uL (ref 0.00–0.07)
Basophils Absolute: 0 10*3/uL (ref 0.0–0.1)
Basophils Relative: 1 %
Eosinophils Absolute: 0.1 10*3/uL (ref 0.0–0.5)
Eosinophils Relative: 2 %
HCT: 32.3 % — ABNORMAL LOW (ref 39.0–52.0)
Hemoglobin: 10.7 g/dL — ABNORMAL LOW (ref 13.0–17.0)
Immature Granulocytes: 1 %
Lymphocytes Relative: 28 %
Lymphs Abs: 1.1 10*3/uL (ref 0.7–4.0)
MCH: 27.9 pg (ref 26.0–34.0)
MCHC: 33.1 g/dL (ref 30.0–36.0)
MCV: 84.1 fL (ref 80.0–100.0)
Monocytes Absolute: 0.3 10*3/uL (ref 0.1–1.0)
Monocytes Relative: 8 %
Neutro Abs: 2.4 10*3/uL (ref 1.7–7.7)
Neutrophils Relative %: 60 %
Platelets: 99 10*3/uL — ABNORMAL LOW (ref 150–400)
RBC: 3.84 MIL/uL — ABNORMAL LOW (ref 4.22–5.81)
RDW: 14.3 % (ref 11.5–15.5)
WBC: 3.9 10*3/uL — ABNORMAL LOW (ref 4.0–10.5)
nRBC: 0 % (ref 0.0–0.2)

## 2020-04-14 LAB — LIPID PANEL
Cholesterol: 492 mg/dL — ABNORMAL HIGH (ref 0–200)
HDL: 34 mg/dL — ABNORMAL LOW (ref >40)
LDL Cholesterol: UNDETERMINED mg/dL (ref 0–99)
Total CHOL/HDL Ratio: 14.5 RATIO
Triglycerides: 747 mg/dL — ABNORMAL HIGH (ref <150)
VLDL: UNDETERMINED mg/dL (ref 0–40)

## 2020-04-14 LAB — T4, FREE: Free T4: 0.82 ng/dL (ref 0.61–1.12)

## 2020-04-14 LAB — TSH: TSH: 4.014 u[IU]/mL (ref 0.350–4.500)

## 2020-04-14 LAB — VITAMIN D 25 HYDROXY (VIT D DEFICIENCY, FRACTURES): Vit D, 25-Hydroxy: 15.95 ng/mL — ABNORMAL LOW (ref 30–100)

## 2020-04-14 MED ORDER — ONETOUCH ULTRA 2 W/DEVICE KIT: 1.0000 | PACK | Freq: Once | 0 refills | Status: AC

## 2020-04-14 NOTE — Patient Instructions (Signed)
Diabetes Mellitus and Nutrition, Adult When you have diabetes, or diabetes mellitus, it is very important to have healthy eating habits because your blood sugar (glucose) levels are greatly affected by what you eat and drink. Eating healthy foods in the right amounts, at about the same times every day, can help you:  Control your blood glucose.  Lower your risk of heart disease.  Improve your blood pressure.  Reach or maintain a healthy weight. What can affect my meal plan? Every person with diabetes is different, and each person has different needs for a meal plan. Your health care provider may recommend that you work with a dietitian to make a meal plan that is best for you. Your meal plan may vary depending on factors such as:  The calories you need.  The medicines you take.  Your weight.  Your blood glucose, blood pressure, and cholesterol levels.  Your activity level.  Other health conditions you have, such as heart or kidney disease. How do carbohydrates affect me? Carbohydrates, also called carbs, affect your blood glucose level more than any other type of food. Eating carbs naturally raises the amount of glucose in your blood. Carb counting is a method for keeping track of how many carbs you eat. Counting carbs is important to keep your blood glucose at a healthy level, especially if you use insulin or take certain oral diabetes medicines. It is important to know how many carbs you can safely have in each meal. This is different for every person. Your dietitian can help you calculate how many carbs you should have at each meal and for each snack. How does alcohol affect me? Alcohol can cause a sudden decrease in blood glucose (hypoglycemia), especially if you use insulin or take certain oral diabetes medicines. Hypoglycemia can be a life-threatening condition. Symptoms of hypoglycemia, such as sleepiness, dizziness, and confusion, are similar to symptoms of having too much  alcohol.  Do not drink alcohol if: ? Your health care provider tells you not to drink. ? You are pregnant, may be pregnant, or are planning to become pregnant.  If you drink alcohol: ? Do not drink on an empty stomach. ? Limit how much you use to:  0-1 drink a day for women.  0-2 drinks a day for men. ? Be aware of how much alcohol is in your drink. In the [place] one drink equals one 12 oz bottle of beer (355 mL), one 5 oz glass of wine (148 mL), or one 1 oz glass of hard liquor (44 mL). ? Keep yourself hydrated with water, diet soda, or unsweetened iced tea.  Keep in mind that regular soda, juice, and other mixers may contain a lot of sugar and must be counted as carbs. What are tips for following this plan? Reading food labels  Start by checking the serving size on the "Nutrition Facts" label of packaged foods and drinks. The amount of calories, carbs, fats, and other nutrients listed on the label is based on one serving of the item. Many items contain more than one serving per package.  Check the total grams (g) of carbs in one serving. You can calculate the number of servings of carbs in one serving by dividing the total carbs by 15. For example, if a food has 30 g of total carbs per serving, it would be equal to 2 servings of carbs.  Check the number of grams (g) of saturated fats and trans fats in one serving. Choose foods that have   a low amount or none of these fats.  Check the number of milligrams (mg) of salt (sodium) in one serving. Most people should limit total sodium intake to less than 2,300 mg per day.  Always check the nutrition information of foods labeled as "low-fat" or "nonfat." These foods may be higher in added sugar or refined carbs and should be avoided.  Talk to your dietitian to identify your daily goals for nutrients listed on the label. Shopping  Avoid buying canned, pre-made, or processed foods. These foods tend to be high in fat, sodium, and added  sugar.  Shop around the outside edge of the grocery store. This is where you will most often find fresh fruits and vegetables, bulk grains, fresh meats, and fresh dairy. Cooking  Use low-heat cooking methods, such as baking, instead of high-heat cooking methods like deep frying.  Cook using healthy oils, such as olive, canola, or sunflower oil.  Avoid cooking with butter, cream, or high-fat meats. Meal planning  Eat meals and snacks regularly, preferably at the same times every day. Avoid going long periods of time without eating.  Eat foods that are high in fiber, such as fresh fruits, vegetables, beans, and whole grains. Talk with your dietitian about how many servings of carbs you can eat at each meal.  Eat 4-6 oz (112-168 g) of lean protein each day, such as lean meat, chicken, fish, eggs, or tofu. One ounce (oz) of lean protein is equal to: ? 1 oz (28 g) of meat, chicken, or fish. ? 1 egg. ?  cup (62 g) of tofu.  Eat some foods each day that contain healthy fats, such as avocado, nuts, seeds, and fish.   What foods should I eat? Fruits Berries. Apples. Oranges. Peaches. Apricots. Plums. Grapes. Mango. Papaya. Pomegranate. Kiwi. Cherries. Vegetables Lettuce. Spinach. Leafy greens, including kale, chard, collard greens, and mustard greens. Beets. Cauliflower. Cabbage. Broccoli. Carrots. Green beans. Tomatoes. Peppers. Onions. Cucumbers. Brussels sprouts. Grains Whole grains, such as whole-wheat or whole-grain bread, crackers, tortillas, cereal, and pasta. Unsweetened oatmeal. Quinoa. Brown or wild rice. Meats and other proteins Seafood. Poultry without skin. Lean cuts of poultry and beef. Tofu. Nuts. Seeds. Dairy Low-fat or fat-free dairy products such as milk, yogurt, and cheese. The items listed above may not be a complete list of foods and beverages you can eat. Contact a dietitian for more information. What foods should I avoid? Fruits Fruits canned with  syrup. Vegetables Canned vegetables. Frozen vegetables with butter or cream sauce. Grains Refined white flour and flour products such as bread, pasta, snack foods, and cereals. Avoid all processed foods. Meats and other proteins Fatty cuts of meat. Poultry with skin. Breaded or fried meats. Processed meat. Avoid saturated fats. Dairy Full-fat yogurt, cheese, or milk. Beverages Sweetened drinks, such as soda or iced tea. The items listed above may not be a complete list of foods and beverages you should avoid. Contact a dietitian for more information. Questions to ask a health care provider  Do I need to meet with a diabetes educator?  Do I need to meet with a dietitian?  What number can I call if I have questions?  When are the best times to check my blood glucose? Where to find more information:  American Diabetes Association: diabetes.org  Academy of Nutrition and Dietetics: www.eatright.org  National Institute of Diabetes and Digestive and Kidney Diseases: www.niddk.nih.gov  Association of Diabetes Care and Education Specialists: www.diabeteseducator.org Summary  It is important to have healthy eating   habits because your blood sugar (glucose) levels are greatly affected by what you eat and drink.  A healthy meal plan will help you control your blood glucose and maintain a healthy lifestyle.  Your health care provider may recommend that you work with a dietitian to make a meal plan that is best for you.  Keep in mind that carbohydrates (carbs) and alcohol have immediate effects on your blood glucose levels. It is important to count carbs and to use alcohol carefully. This information is not intended to replace advice given to you by your health care provider. Make sure you discuss any questions you have with your health care provider. Document Revised: 01/16/2019 Document Reviewed: 01/16/2019 Elsevier Patient Education  2021 [employer]  

## 2020-04-14 NOTE — Progress Notes (Signed)
Endocrinology Consult Note       04/14/2020, 6:47 PM   Subjective:    Patient ID: Jose Koch, male    DOB: [birth year].  Jose Koch is being seen in consultation for management of currently uncontrolled symptomatic diabetes requested by  Patient, No Pcp Per.   Past Medical History:  Diagnosis Date  . COVID   . Diabetes mellitus without complication ([place])   . High cholesterol   . Hypertension     History reviewed. No pertinent surgical history.  Social History   Socioeconomic History  . Marital status: Single    Spouse name: Not on file  . Number of children: Not on file  . Years of education: Not on file  . Highest education level: Not on file  Occupational History  . Not on file  Tobacco Use  . Smoking status: Never Smoker  . Smokeless tobacco: Never Used  Substance and Sexual Activity  . Alcohol use: Not on file  . Drug use: Not on file  . Sexual activity: Not on file  Other Topics Concern  . Not on file  Social History Narrative  . Not on file   Social Determinants of Health   Financial Resource Strain: Not on file  Food Insecurity: Not on file  Transportation Needs: Not on file  Physical Activity: Not on file  Stress: Not on file  Social Connections: Not on file    History reviewed. No pertinent family history.  Outpatient Encounter Medications as of 04/14/2020  Medication Sig  . blood glucose meter kit and supplies KIT Dispense based on patient and insurance preference. Use up to four times daily as directed. (FOR ICD-9 250.00, 250.01).  . Blood Glucose Monitoring Suppl (ONE TOUCH ULTRA 2) w/Device KIT 1 Device by Does not apply route once for 1 dose.  . cholecalciferol (VITAMIN D3) 25 MCG (1000 UNIT) tablet Take 1,000 Units by mouth daily.  . insulin glargine (LANTUS) 100 UNIT/ML Solostar Pen Inject 20 Units into the skin daily.  . Insulin Pen Needle 31G X 5 MM MISC 1 Units by  Does not apply route daily.  . metFORMIN (GLUCOPHAGE XR) 500 MG 24 hr tablet Take 1 tablet (500 mg total) by mouth daily with breakfast.  . metoCLOPramide (REGLAN) 10 MG tablet Take 1 tablet (10 mg total) by mouth 3 (three) times daily with meals.  Glory Rosebush Delica Lancets 14E MISC PLEASE SEE ATTACHED FOR DETAILED DIRECTIONS  . pantoprazole (PROTONIX) 40 MG tablet Take 1 tablet (40 mg total) by mouth daily.  . vitamin C (ASCORBIC ACID) 500 MG tablet Take 500 mg by mouth daily.  . [DISCONTINUED] ONETOUCH ULTRA test strip SMARTSIG:1 Strip(s) Via Meter Every Morning   No facility-administered encounter medications on file as of 04/14/2020.    ALLERGIES: No Known Allergies  VACCINATION STATUS:  There is no immunization history on file for this patient.  Diabetes He presents for his initial diabetic visit. He has type 2 diabetes mellitus. Onset time: Was diagnosed at approx age of 29. His disease course has been worsening. There are no hypoglycemic associated symptoms. Associated symptoms include fatigue, polydipsia, polyphagia and polyuria. There are no hypoglycemic complications. Symptoms are worsening. There  are no diabetic complications. Risk factors for coronary artery disease include diabetes mellitus, dyslipidemia, hypertension and male sex. Current diabetic treatment includes insulin injections and oral agent (monotherapy). He is compliant with treatment most of the time. His weight is decreasing steadily. He is following a generally unhealthy diet. When asked about meal planning, he reported none. He has not had a previous visit with a dietitian. He rarely participates in exercise. (He presents today for his consultation with no meter or logs to review.  He has not been checking his glucose routinely as he does not have a meter.  He was recently hospitalized for complications from gastroparesis.  During his hospitalization his A1c was checked and was 12%.  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  He admits to the consumption of sugary beverages such as juice and eats typically skips breakfast with occasional snacks.  He does not engage in routine physical activity aside from his normal daily activities.  He denies any s/s of hypoglycemia.) An ACE inhibitor/angiotensin II receptor blocker is not being taken. He does not see a podiatrist.Eye exam is not current.  Hypertension This is a chronic problem. The current episode started more than 1 year ago. The problem has been resolved since onset. The problem is controlled. There are no associated agents to hypertension. Risk factors for coronary artery disease include diabetes mellitus, dyslipidemia and male gender. Past treatments include nothing. Compliance problems include diet and exercise.   Hyperlipidemia This is a chronic problem. The current episode started more than 1 year ago. The problem is uncontrolled. Recent lipid tests were reviewed and are high. Exacerbating diseases include diabetes. Factors aggravating his hyperlipidemia include fatty foods. He is currently on no antihyperlipidemic treatment. Compliance problems include adherence to diet and adherence to exercise.  Risk factors for coronary artery disease include diabetes mellitus, dyslipidemia, male sex and a sedentary lifestyle.     Review of systems  Constitutional: + steadily decreasing body weight (due to recent GI complications),  current Body mass index is 25.45 kg/m. , + fatigue, no subjective hyperthermia, no subjective hypothermia Eyes: + blurry vision, no xerophthalmia ENT: no sore throat, no nodules palpated in throat, no dysphagia/odynophagia, no hoarseness Cardiovascular: no chest pain, no shortness of breath, no palpitations, + leg swelling Respiratory: no cough, no shortness of breath Gastrointestinal: + nausea/vomiting (controlled with antiemetic) Musculoskeletal: no muscle/joint aches Skin: no rashes, no hyperemia,  chronic dry skin condition Neurological: no tremors, no numbness, no tingling, no dizziness Psychiatric: no depression, no anxiety  Objective:     BP 107/70 (BP Location: Right Arm, Patient Position: Sitting)   Pulse (!) 105   Ht _0  (1.727 m)   Wt 167 lb 6.4 oz (75.9 kg)   BMI 25.45 kg/m   Wt Readings from Last 3 Encounters:  04/14/20 167 lb 6.4 oz (75.9 kg)  04/05/20 145 lb 14.4 oz (66.2 kg)     BP Readings from Last 3 Encounters:  04/14/20 107/70  04/07/20 103/65     Physical Exam- Limited  Constitutional:  Body mass index is 25.45 kg/m. , not in acute distress, normal state of mind Eyes:  EOMI, no exophthalmos Neck: Supple Cardiovascular: +tachycardic, no murmers, rubs, or gallops, + mild pitting edema to BLE Respiratory: Adequate breathing efforts, no crackles, rales, rhonchi, or wheezing Musculoskeletal: no gross deformities, strength intact in all four extremities, no gross restriction of joint movements Skin:  no rashes, no hyperemia, +generalized dry flaky  skin Neurological: no tremor with outstretched hands    CMP ( most recent) CMP     Component Value Date/Time   NA 136 04/07/2020 0509   K 3.0 (L) 04/07/2020 0509   CL 102 04/07/2020 0509   CO2 27 04/07/2020 0509   GLUCOSE 109 (H) 04/07/2020 0509   BUN 16 04/07/2020 0509   CREATININE 1.22 04/07/2020 0509   CALCIUM 8.2 (L) 04/07/2020 0509   PROT 5.7 (L) 04/07/2020 0509   ALBUMIN 3.2 (L) 04/07/2020 0509   AST 15 04/07/2020 0509   ALT 16 04/07/2020 0509   ALKPHOS 72 04/07/2020 0509   BILITOT 1.0 04/07/2020 0509   GFRNONAA >60 04/07/2020 0509     Diabetic Labs (most recent): Lab Results  Component Value Date   HGBA1C 12.0 (H) 04/06/2020     Lipid Panel ( most recent) Lipid Panel     Component Value Date/Time   CHOL 492 (H) 04/14/2020 1551   TRIG 747 (H) 04/14/2020 1551   HDL 34 (L) 04/14/2020 1551   CHOLHDL 14.5 04/14/2020 1551   VLDL UNABLE TO CALCULATE IF TRIGLYCERIDE OVER 400 mg/dL  04/14/2020 1551   LDLCALC UNABLE TO CALCULATE IF TRIGLYCERIDE OVER 400 mg/dL 04/14/2020 1551      Lab Results  Component Value Date   TSH 4.014 04/14/2020           Assessment & Plan:   1) Hyperglycemia due to diabetes mellitus ([place])  - Teryl Mcconaghy has currently uncontrolled symptomatic type 2 DM since 50 years of age, with most recent A1c of 12 %.   He presents today for his consultation with no meter or logs to review.  He has not been checking his glucose routinely as he does not have a meter.  He was recently hospitalized for complications from gastroparesis.  During his hospitalization his A1c was checked and was 12%.  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  He admits to the consumption of sugary beverages such as juice and eats typically skips breakfast with occasional snacks.  He does not engage in routine physical activity aside from his normal daily activities.  He denies any s/s of hypoglycemia.  He reports his heaviest weight being 210 lbs and denies hx of alcohol abuse.  -Recent labs reviewed.  - I had a long discussion with him about the progressive nature of diabetes and the pathology behind its complications. -his diabetes is not currently complicated but he remains at a high risk for more acute and chronic complications which include CAD, CVA, CKD, retinopathy, and neuropathy. These are all discussed in detail with him.  - I have counseled him on diet  and weight management by adopting a carbohydrate restricted/protein rich diet. Patient is encouraged to switch to unprocessed or minimally processed complex starch and increased protein intake (animal or plant source), fruits, and vegetables. -  he is advised to stick to a routine mealtimes to eat 3 meals a day and avoid unnecessary snacks (to snack only to correct hypoglycemia).   - he acknowledges that there is a room for improvement in his food and drink choices. - Suggestion is  made for him to avoid simple carbohydrates  from his diet including Cakes, Sweet Desserts, Ice Cream, Soda (diet and regular), Sweet Tea, Candies, Chips, Cookies, Store Bought Juices, Alcohol in Excess of  1-2 drinks a day, Artificial Sweeteners, Coffee Creamer, and "Sugar-free" Products. This will help patient to have more stable blood glucose profile  and potentially avoid unintended weight gain.  - he will be scheduled with Jearld Fenton, RDN, CDE for diabetes education.  - I have approached him with the following individualized plan to manage  his diabetes and patient agrees:   -He is encouraged to continue Lantus 20 units SQ nightly and Metformin 500 mg ER po daily with breakfast.  -he is encouraged to start monitoring glucose 4 times daily, before meals and before bed, to log their readings on the clinic sheets provided, and bring them to review at follow up appointment in 2 weeks.  - he is warned not to take insulin without proper monitoring per orders. - Adjustment parameters are given to him for hypo and hyperglycemia in writing. - he is encouraged to call clinic for blood glucose levels less than 70 or above 300 mg /dl.  - he is not a good candidate for incretin therapy due to significantly elevated triglycerides and body habitus.  - Specific targets for  A1c;  LDL, HDL,  and Triglycerides were discussed with the patient.  2) Blood Pressure /Hypertension:  his blood pressure is controlled to target without the use of antihypertensive medications.    3) Lipids/Hyperlipidemia:    Review of his recent lipid panel from 06/26/19 showed severely high triglycerides at 2018, LDL unable to be calculated. He is not currently on any lipid lowering medications at this time, however he had previously been on a statin.  Will recheck lipid panel prior to next visit and discuss reinitiating a statin.  4)  Weight/Diet:  his Body mass index is 25.45 kg/m.  -  he is not a candidate for weight loss.   Exercise, and detailed carbohydrates information provided  -  detailed on discharge instructions.  5) Chronic Care/Health Maintenance: -he is not on ACEI/ARB or Statin medications and is encouraged to initiate and continue to follow up with Ophthalmology, Dentist, Podiatrist at least yearly or according to recommendations, and advised to stay away from smoking. I have recommended yearly flu vaccine and pneumonia vaccine at least every 5 years; moderate intensity exercise for up to 150 minutes weekly; and sleep for at least 7 hours a day.  - he is advised to maintain close follow up with Patient, No Pcp Per for primary care needs, as well as his other providers for optimal and coordinated care.   - Time spent in this patient care: 60 min, of which > 50% was spent in counseling him about his diabetes and the rest reviewing his blood glucose logs, discussing his hypoglycemia and hyperglycemia episodes, reviewing his current and previous labs/studies (including abstraction from other facilities) and medications doses and developing a long term treatment plan based on the latest standards of care/guidelines; and documenting his care.    Please refer to Patient Instructions for Blood Glucose Monitoring and Insulin/Medications Dosing Guide" in media tab for additional information. Please also refer to "Patient Self Inventory" in the Media tab for reviewed elements of pertinent patient history.  Janit Pagan participated in the discussions, expressed understanding, and voiced agreement with the above plans.  All questions were answered to his satisfaction. he is encouraged to contact clinic should he have any questions or concerns prior to his return visit.   Follow up plan: - Return in about 2 weeks (around 04/28/2020) for Diabetes follow up, Previsit labs, ABI next visit, Bring glucometer and logs.  Rayetta Pigg, [facility] [address] [place], [place]  [address] Phone: [phone] Fax: [phone]  04/14/2020, 6:47  PM

## 2020-04-15 LAB — LDL CHOLESTEROL, DIRECT: Direct LDL: 106.3 mg/dL — ABNORMAL HIGH (ref 0–99)

## 2020-04-21 ENCOUNTER — Encounter: Payer: Self-pay | Admitting: Nurse Practitioner

## 2020-04-28 ENCOUNTER — Other Ambulatory Visit: Payer: Self-pay

## 2020-04-28 ENCOUNTER — Ambulatory Visit (INDEPENDENT_AMBULATORY_CARE_PROVIDER_SITE_OTHER): Payer: Self-pay | Admitting: Nurse Practitioner

## 2020-04-28 ENCOUNTER — Encounter: Payer: Self-pay | Admitting: Nurse Practitioner

## 2020-04-28 VITALS — BP 119/81 | HR 97 | Ht 68.0 in | Wt 161.8 lb

## 2020-04-28 DIAGNOSIS — R6889 Other general symptoms and signs: Secondary | ICD-10-CM

## 2020-04-28 DIAGNOSIS — E1165 Type 2 diabetes mellitus with hyperglycemia: Secondary | ICD-10-CM

## 2020-04-28 DIAGNOSIS — I10 Essential (primary) hypertension: Secondary | ICD-10-CM

## 2020-04-28 DIAGNOSIS — E782 Mixed hyperlipidemia: Secondary | ICD-10-CM

## 2020-04-28 MED ORDER — INSULIN GLARGINE 100 UNIT/ML SOLOSTAR PEN: 30.0000 [IU] | PEN_INJECTOR | Freq: Every day | SUBCUTANEOUS | 11 refills | Status: DC

## 2020-04-28 MED ORDER — GLUCOSE BLOOD VI STRP: ORAL_STRIP | 12 refills | Status: DC

## 2020-04-28 NOTE — Progress Notes (Signed)
Endocrinology Follow Up Note       04/28/2020, 11:37 AM   Subjective:    Patient ID: Jose Koch, male    DOB: 1971-02-16.  Jose Koch is being seen in follow up after being seen in consultation for management of currently uncontrolled symptomatic diabetes requested by  Patient, No Pcp Per.   Past Medical History:  Diagnosis Date  . COVID   . Diabetes mellitus without complication ([place])   . High cholesterol   . Hypertension     History reviewed. No pertinent surgical history.  Social History   Socioeconomic History  . Marital status: Single    Spouse name: Not on file  . Number of children: Not on file  . Years of education: Not on file  . Highest education level: Not on file  Occupational History  . Not on file  Tobacco Use  . Smoking status: Never Smoker  . Smokeless tobacco: Never Used  Substance and Sexual Activity  . Alcohol use: Not on file  . Drug use: Not on file  . Sexual activity: Not on file  Other Topics Concern  . Not on file  Social History Narrative  . Not on file   Social Determinants of Health   Financial Resource Strain: Not on file  Food Insecurity: Not on file  Transportation Needs: Not on file  Physical Activity: Not on file  Stress: Not on file  Social Connections: Not on file    History reviewed. No pertinent family history.  Outpatient Encounter Medications as of 04/28/2020  Medication Sig  . blood glucose meter kit and supplies KIT Dispense based on patient and insurance preference. Use up to four times daily as directed. (FOR ICD-9 250.00, 250.01).  . cholecalciferol (VITAMIN D3) 25 MCG (1000 UNIT) tablet Take 1,000 Units by mouth daily.  Marland Kitchen glucose blood test strip Use as instructed to monitor glucose twice daily, before breakfast and before bed.  . Insulin Pen Needle 31G X 5 MM MISC 1 Units by Does not apply route daily.  . metFORMIN (GLUCOPHAGE XR) 500 MG  24 hr tablet Take 1 tablet (500 mg total) by mouth daily with breakfast.  . metoCLOPramide (REGLAN) 10 MG tablet Take 1 tablet (10 mg total) by mouth 3 (three) times daily with meals.  Glory Rosebush Delica Lancets 70V MISC PLEASE SEE ATTACHED FOR DETAILED DIRECTIONS  . pantoprazole (PROTONIX) 40 MG tablet Take 1 tablet (40 mg total) by mouth daily.  . vitamin C (ASCORBIC ACID) 500 MG tablet Take 500 mg by mouth daily.  . [DISCONTINUED] insulin glargine (LANTUS) 100 UNIT/ML Solostar Pen Inject 20 Units into the skin daily.  . insulin glargine (LANTUS) 100 UNIT/ML Solostar Pen Inject 30 Units into the skin daily.   No facility-administered encounter medications on file as of 04/28/2020.    ALLERGIES: No Known Allergies  VACCINATION STATUS:  There is no immunization history on file for this patient.  Diabetes He presents for his follow-up diabetic visit. He has type 2 diabetes mellitus. Onset time: Was diagnosed at approx age of 54. His disease course has been improving. There are no hypoglycemic associated symptoms. Associated symptoms include fatigue, polydipsia, polyphagia and polyuria. There are  no hypoglycemic complications. Symptoms are improving. Diabetic complications include PVD. (Abnormal ABI today) Risk factors for coronary artery disease include diabetes mellitus, dyslipidemia, hypertension and male sex. Current diabetic treatment includes insulin injections and oral agent (monotherapy). He is compliant with treatment most of the time. His weight is decreasing steadily. He is following a generally unhealthy diet. When asked about meal planning, he reported none. He has not had a previous visit with a dietitian. He rarely participates in exercise. His home blood glucose trend is decreasing steadily. His overall blood glucose range is >200 mg/dl. (He presents today with his meter, no logs showing persistent hyperglycemia, however improving from last visit.  He increased his dose of Lantus to 25  units about 3-4 days ago to help with fasting hyperglycemia.  Analysis of his meter shows 7-day average of 348 and 14-day average of 335.  He denies any hypoglycemia.  His stomach issues are improving, he is now able to eat and keep most meals down.  He still has follow up with GI and dietician in the next few weeks.) An ACE inhibitor/angiotensin II receptor blocker is not being taken. He does not see a podiatrist.Eye exam is not current.  Hypertension This is a chronic problem. The current episode started more than 1 year ago. The problem has been resolved since onset. The problem is controlled. There are no associated agents to hypertension. Risk factors for coronary artery disease include diabetes mellitus, dyslipidemia and male gender. Past treatments include nothing. Compliance problems include diet and exercise.  Hypertensive end-organ damage includes PVD.  Hyperlipidemia This is a chronic problem. The current episode started more than 1 year ago. The problem is uncontrolled. Recent lipid tests were reviewed and are high. Exacerbating diseases include diabetes. Factors aggravating his hyperlipidemia include fatty foods. He is currently on no antihyperlipidemic treatment. Compliance problems include adherence to diet and adherence to exercise.  Risk factors for coronary artery disease include diabetes mellitus, dyslipidemia, male sex and a sedentary lifestyle.     Review of systems  Constitutional: + steadily decreasing body weight (due to recent GI complications),  current Body mass index is 24.6 kg/m. , + fatigue, no subjective hyperthermia, no subjective hypothermia Eyes: + blurry vision, no xerophthalmia ENT: no sore throat, no nodules palpated in throat, no dysphagia/odynophagia, no hoarseness Cardiovascular: no chest pain, no shortness of breath, no palpitations, + leg swelling (improved) Respiratory: no cough, no shortness of breath Gastrointestinal: + nausea/vomiting intermittent  (Improving) Musculoskeletal: no muscle/joint aches Skin: no rashes, no hyperemia, chronic dry skin condition Neurological: no tremors, no numbness, no tingling, no dizziness Psychiatric: no depression, no anxiety  Objective:     BP 119/81 (BP Location: Right Arm)   Pulse 97   Ht 5' 8"  (1.727 m)   Wt 161 lb 12.8 oz (73.4 kg)   BMI 24.60 kg/m   Wt Readings from Last 3 Encounters:  04/28/20 161 lb 12.8 oz (73.4 kg)  04/14/20 167 lb 6.4 oz (75.9 kg)  04/05/20 145 lb 14.4 oz (66.2 kg)     BP Readings from Last 3 Encounters:  04/28/20 119/81  04/14/20 107/70  04/07/20 103/65     Physical Exam- Limited  Constitutional:  Body mass index is 24.6 kg/m. , not in acute distress, normal state of mind Eyes:  EOMI, no exophthalmos Neck: Supple Cardiovascular: RRR, no murmers, rubs, or gallops, no edema Respiratory: Adequate breathing efforts, no crackles, rales, rhonchi, or wheezing Musculoskeletal: no gross deformities, strength intact in all four extremities, no  gross restriction of joint movements Skin:  no rashes, no hyperemia, +generalized dry flaky skin Neurological: no tremor with outstretched hands   Foot exam:   No rashes, ulcers, cuts, + calluses, onychodystrophy.   No pulse to LLE, diminished pulse to RLE  Good sensation to 10 g monofilament bilat.  POCT ABI Results 04/28/20   Right ABI:  1.23      Left ABI:  PAD  Right leg systolic / diastolic: 983/38 mmHg Left leg systolic / diastolic: PAD/PAD mmHg  Arm systolic / diastolic: 250/53 mmHG  Detailed report will be scanned into patient chart.  CMP ( most recent) CMP     Component Value Date/Time   NA 136 04/07/2020 0509   K 3.0 (L) 04/07/2020 0509   CL 102 04/07/2020 0509   CO2 27 04/07/2020 0509   GLUCOSE 109 (H) 04/07/2020 0509   BUN 16 04/07/2020 0509   CREATININE 1.22 04/07/2020 0509   CALCIUM 8.2 (L) 04/07/2020 0509   PROT 5.7 (L) 04/07/2020 0509   ALBUMIN 3.2 (L) 04/07/2020 0509   AST 15  04/07/2020 0509   ALT 16 04/07/2020 0509   ALKPHOS 72 04/07/2020 0509   BILITOT 1.0 04/07/2020 0509   GFRNONAA >60 04/07/2020 0509     Diabetic Labs (most recent): Lab Results  Component Value Date   HGBA1C 12.0 (H) 04/06/2020     Lipid Panel ( most recent) Lipid Panel     Component Value Date/Time   CHOL 492 (H) 04/14/2020 1551   TRIG 747 (H) 04/14/2020 1551   HDL 34 (L) 04/14/2020 1551   CHOLHDL 14.5 04/14/2020 1551   VLDL UNABLE TO CALCULATE IF TRIGLYCERIDE OVER 400 mg/dL 04/14/2020 1551   LDLCALC UNABLE TO CALCULATE IF TRIGLYCERIDE OVER 400 mg/dL 04/14/2020 1551   LDLDIRECT 106.3 (H) 04/14/2020 1551      Lab Results  Component Value Date   TSH 4.014 04/14/2020   FREET4 0.82 04/14/2020           Assessment & Plan:   1) Hyperglycemia due to diabetes mellitus ([place])  - Jose Koch has currently uncontrolled symptomatic type 2 DM since 50 years of age, with most recent A1c of 12 %.   He presents today with his meter, no logs showing persistent hyperglycemia, however improving from last visit.  He increased his dose of Lantus to 25 units about 3-4 days ago to help with fasting hyperglycemia.  Analysis of his meter shows 7-day average of 348 and 14-day average of 335.  He denies any hypoglycemia.  His stomach issues are improving, he is now able to eat and keep most meals down.  He still has follow up with GI and dietician in the next few weeks.  -Recent labs reviewed.  - I had a long discussion with him about the progressive nature of diabetes and the pathology behind its complications. -his diabetes is not currently complicated but he remains at a high risk for more acute and chronic complications which include CAD, CVA, CKD, retinopathy, and neuropathy. These are all discussed in detail with him.  - Nutritional counseling repeated at each appointment due to patients tendency to fall back in to old habits.  - The patient admits there is a room for improvement in  their diet and drink choices. -  Suggestion is made for the patient to avoid simple carbohydrates from their diet including Cakes, Sweet Desserts / Pastries, Ice Cream, Soda (diet and regular), Sweet Tea, Candies, Chips, Cookies, Sweet Pastries,  Store Bought Juices,  Alcohol in Excess of  1-2 drinks a day, Artificial Sweeteners, Coffee Creamer, and "Sugar-free" Products. This will help patient to have stable blood glucose profile and potentially avoid unintended weight gain.   - I encouraged the patient to switch to  unprocessed or minimally processed complex starch and increased protein intake (animal or plant source), fruits, and vegetables.   - Patient is advised to stick to a routine mealtimes to eat 3 meals  a day and avoid unnecessary snacks ( to snack only to correct hypoglycemia).  - he will be scheduled with Jose Koch, RDN, CDE for diabetes education.  - I have approached him with the following individualized plan to manage  his diabetes and patient agrees:   -Given his hyperglycemia, he is encouraged to increase his Lantus to 30 units SQ nightly and continue his Metformin 500 mg ER daily with breakfast.    -He is encouraged to continue monitoring blood glucose at least twice daily, before breakfast and before bed, and to call the clinic if he has readings less than 70 or greater than 300 for 3 tests in a row.  - he is warned not to take insulin without proper monitoring per orders. - Adjustment parameters are given to him for hypo and hyperglycemia in writing.  - he is not a good candidate for incretin therapy due to significantly elevated triglycerides and body habitus.  - Specific targets for  A1c;  LDL, HDL,  and Triglycerides were discussed with the patient.  2) Blood Pressure /Hypertension:  his blood pressure is controlled to target without the use of antihypertensive medications.    3) Lipids/Hyperlipidemia:    Review of his recent lipid panel from 06/26/19 showed  severely high triglycerides at 2018, LDL unable to be calculated. He is not currently on any lipid lowering medications at this time, however he had previously been on a statin.  Will recheck lipid panel prior to next visit and discuss reinitiating a statin.  4)  Weight/Diet:  his Body mass index is 24.6 kg/m.  -  he is not a candidate for weight loss.  Exercise, and detailed carbohydrates information provided  -  detailed on discharge instructions.  5) Chronic Care/Health Maintenance: -he is not on ACEI/ARB or Statin medications and is encouraged to initiate and continue to follow up with Ophthalmology, Dentist, Podiatrist at least yearly or according to recommendations, and advised to stay away from smoking. I have recommended yearly flu vaccine and pneumonia vaccine at least every 5 years; moderate intensity exercise for up to 150 minutes weekly; and sleep for at least 7 hours a day.  - he is advised to maintain close follow up with Patient, No Pcp Per for primary care needs, as well as his other providers for optimal and coordinated care.  -Abnormal ABI results: will be referred to [facility] for further evaluation and treatment.   - Time spent on this patient care encounter:  40 min, of which > 50% was spent in  counseling and the rest reviewing his blood glucose logs , discussing his hypoglycemia and hyperglycemia episodes, reviewing his current and  previous labs / studies  ( including abstraction from other facilities) and medications  doses and developing a  long term treatment plan and documenting his care.   Please refer to Patient Instructions for Blood Glucose Monitoring and Insulin/Medications Dosing Guide"  in media tab for additional information. Please  also refer to " Patient Self Inventory" in the Media  tab for reviewed elements of  pertinent patient history.  Jose Koch participated in the discussions, expressed understanding, and voiced agreement with the above plans.  All questions  were answered to his satisfaction. he is encouraged to contact clinic should he have any questions or concerns prior to his return visit.   Follow up plan: - Return in about 3 months (around 07/29/2020) for Diabetes follow up with A1c in office, No previsit labs, Bring glucometer and logs.  Rayetta Pigg, [facility] [address] [place], [place] [address] Phone: [phone] Fax: [phone]  04/28/2020, 11:37 AM

## 2020-04-28 NOTE — Patient Instructions (Signed)

## 2020-05-07 ENCOUNTER — Other Ambulatory Visit: Payer: Self-pay

## 2020-05-07 ENCOUNTER — Encounter: Payer: Self-pay | Admitting: Nurse Practitioner

## 2020-05-07 DIAGNOSIS — E1165 Type 2 diabetes mellitus with hyperglycemia: Secondary | ICD-10-CM

## 2020-05-07 MED ORDER — METFORMIN HCL ER 500 MG PO TB24: 500.0000 mg | ORAL_TABLET | Freq: Every day | ORAL | 3 refills | Status: DC

## 2020-05-12 ENCOUNTER — Encounter: Payer: Self-pay | Admitting: Nutrition

## 2020-05-12 ENCOUNTER — Other Ambulatory Visit: Payer: Self-pay

## 2020-05-12 ENCOUNTER — Encounter: Payer: Medicaid - Out of State | Attending: Nurse Practitioner | Admitting: Nutrition

## 2020-05-12 VITALS — Ht 68.0 in | Wt 166.0 lb

## 2020-05-12 DIAGNOSIS — I10 Essential (primary) hypertension: Secondary | ICD-10-CM | POA: Diagnosis present

## 2020-05-12 DIAGNOSIS — E1165 Type 2 diabetes mellitus with hyperglycemia: Secondary | ICD-10-CM | POA: Insufficient documentation

## 2020-05-12 DIAGNOSIS — K3184 Gastroparesis: Secondary | ICD-10-CM | POA: Diagnosis present

## 2020-05-12 DIAGNOSIS — E1110 Type 2 diabetes mellitus with ketoacidosis without coma: Secondary | ICD-10-CM | POA: Insufficient documentation

## 2020-05-12 NOTE — Progress Notes (Signed)
Medical Nutrition Therapy  Appointment Start time:  1300  Appointment End time: 1400  Primary concerns today: Diabetes Type , Hyperlipidemia Referral diagnosis: E11.8 Preferred learning style:  no preference indicated Learning readiness:  change in progress   NUTRITION ASSESSMENT  Lantus 25 units Metformin 500 mg daily. He is suppose to be taking 30 units a day per provider notes. He fell late January early February 2022 in his bathroom after blacking out, hitting his head and eye on counter and may have gotten a concussion. Has slower memory and thought processes since then. Went to ER. Nausea is better some but is taking Reglan. Is having issues with his inner ear for 3 weeks before he ended up in [facility] after his fall. Scheduled to see GI soon. 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 He isn't sure. Is eating activia to help with his nausea and GI issues. Had Covid in January 2022 things haven't been the same since according to him.  BS are still in the upper 200-300's. Occasionally a 400 mg/dl  Anthropometrics  Wt Readings from Last 3 Encounters:  05/12/20 166 lb (75.3 kg)  04/28/20 161 lb 12.8 oz (73.4 kg)  04/14/20 167 lb 6.4 oz (75.9 kg)   Ht Readings from Last 3 Encounters:  05/12/20 5\' 8"  (1.727 m)  04/28/20 5\' 8"  (1.727 m)  04/14/20 5\' 8"  (1.727 m)   Body mass index is 25.24 kg/m. @BMIFA @ Facility age limit for growth percentiles is 20 years. Facility age limit for growth percentiles is 20 years.   Clinical Medical Hx: Vit D deficiency, GERD and possible gastroparesis, Hyperlipidemia,  Medications: Lantus 25 units Metformin 500 mg BID. Labs:  Lab Results  Component Value Date   HGBA1C 12.0 (H) 04/06/2020   CMP Latest Ref Rng & Units 04/07/2020 04/06/2020 04/05/2020  Glucose 70 - 99 mg/dL 04/08/2020) 04/09/2020) 04/08/2020)  BUN 6 - 20 mg/dL 16 06/03/2020) 808(U)  Creatinine 0.61 - 1.24 mg/dL 110(R 159(Y) 58(P)  Sodium 135 - 145 mmol/L 136  135 134(L)  Potassium 3.5 - 5.1 mmol/L 3.0(L) 3.6 4.0  Chloride 98 - 111 mmol/L 102 97(L) 89(L)  CO2 22 - 32 mmol/L 27 25 26   Calcium 8.9 - 10.3 mg/dL 8.2(L) 8.6(L) 9.6  Total Protein 6.5 - 8.1 g/dL 92(T) 6.7 7.7  Total Bilirubin 0.3 - 1.2 mg/dL 1.0 2.44) 2.0(H)  Alkaline Phos 38 - 126 U/L 72 88 104  AST 15 - 41 U/L 15 17 23   ALT 0 - 44 U/L 16 22 27    Lipid Panel     Component Value Date/Time   CHOL 492 (H) 04/14/2020 1551   TRIG 747 (H) 04/14/2020 1551   HDL 34 (L) 04/14/2020 1551   CHOLHDL 14.5 04/14/2020 1551   VLDL UNABLE TO CALCULATE IF TRIGLYCERIDE OVER 400 mg/dL 6.5(B   LDLCALC UNABLE TO CALCULATE IF TRIGLYCERIDE OVER 400 mg/dL 04/16/2020   LDLDIRECT 106.3 (H) 04/14/2020 1551    Notable Signs/Symptoms:   Lifestyle & Dietary Hx  BS 200-300's. ON Reglan.  Has been cutting back on sweets.  Lives with his girlfriend,   Estimated daily fluid intake: 24 oz Supplements: Vit C Sleep: varies. Sleeps in during day and stays up at night. Doesn't get up til 10-11. Stress / self-care: cares for his girlfriend who is bedridden. Limited access to food. Referred to local food pantries. Unable to get [employer] due to living in [place]. Current average weekly physical activity: ADL  24-Hr Dietary Recall 10 am First Meal: Eggs,  Snack:  Second Meal: crackers Snack: Third Meal: Chicken, potatoes,  Diet [place] Snack: whatever he can find to eat. Beverages:  Estimated Energy Needs Calories: 2000  Carbohydrate: 225 g Protein: 150g Fat: 56g   NUTRITION DIAGNOSIS  NB-1.1 Food and nutrition-related knowledge deficit As related to Diabetes Type 2.  As evidenced by A1C > 12%.   NUTRITION INTERVENTION  Nutrition education (E-1) on the following topics:  . Nutrition and Diabetes education provided on My Plate, CHO counting, meal planning, portion sizes, timing of meals, avoiding snacks between meals unless having a low blood sugar, target ranges for A1C and  blood sugars, signs/symptoms and treatment of hyper/hypoglycemia, monitoring blood sugars, taking medications as prescribed, benefits of exercising 30 minutes per day and prevention of complications of DM. Marland Kitchen   Handouts Provided Include   The Plate Method   Meal Plan Card  Diabetes Instructions  Food pantry list  Learning Style & Readiness for Change Teaching method utilized: Visual & Auditory  Demonstrated degree of understanding via: Teach Back  Barriers to learning/adherence to lifestyle change: finances, access to food  Goals Established by Pt Goals  Follow MY Plate Eat meals on time Eat 45-60 g CHO at each meal Drink water 6-`16 oz water. Call Whitney to discuss blood sugars weekly until blood sugars better Pick up strips at [facility] in [place] , [address]. Reach out to food pantries for food assistance.  MONITORING & EVALUATION Dietary intake, weekly physical activity, and blood sugars in 2 weeks via MYChart.  Next Steps  Patient is to call Ronny Bacon, FNP to discuss blood sugars tomorrow.Marland Kitchen

## 2020-05-12 NOTE — Patient Instructions (Signed)
Goals  Follow MY Plate Eat meals on time Eat 45-60 g CHO at each meal Drink water 6-`16 oz water. Call Whitney to discuss blood sugars weekly until blood sugars better Pick up strips at [facility] in [place] , [address].

## 2020-05-13 ENCOUNTER — Other Ambulatory Visit: Payer: Self-pay | Admitting: Nurse Practitioner

## 2020-05-13 ENCOUNTER — Encounter: Payer: Self-pay | Admitting: Nutrition

## 2020-05-13 MED ORDER — ACCU-CHEK GUIDE VI STRP: ORAL_STRIP | 12 refills | Status: AC

## 2020-05-16 ENCOUNTER — Other Ambulatory Visit: Payer: Self-pay

## 2020-05-16 ENCOUNTER — Ambulatory Visit (INDEPENDENT_AMBULATORY_CARE_PROVIDER_SITE_OTHER): Payer: PRIVATE HEALTH INSURANCE | Admitting: Gastroenterology

## 2020-05-16 ENCOUNTER — Other Ambulatory Visit: Payer: Self-pay | Admitting: *Deleted

## 2020-05-16 ENCOUNTER — Encounter: Payer: Self-pay | Admitting: Gastroenterology

## 2020-05-16 VITALS — BP 110/73 | HR 99 | Temp 96.9°F | Ht 68.0 in | Wt 167.0 lb

## 2020-05-16 DIAGNOSIS — I614 Nontraumatic intracerebral hemorrhage in cerebellum: Secondary | ICD-10-CM | POA: Diagnosis not present

## 2020-05-16 DIAGNOSIS — D696 Thrombocytopenia, unspecified: Secondary | ICD-10-CM

## 2020-05-16 DIAGNOSIS — R161 Splenomegaly, not elsewhere classified: Secondary | ICD-10-CM

## 2020-05-16 DIAGNOSIS — R112 Nausea with vomiting, unspecified: Secondary | ICD-10-CM | POA: Diagnosis not present

## 2020-05-16 MED ORDER — PANTOPRAZOLE SODIUM 40 MG PO TBEC
40.0000 mg | DELAYED_RELEASE_TABLET | Freq: Two times a day (BID) | ORAL | 3 refills | Status: DC
Start: 2020-05-16 — End: 2020-10-16

## 2020-05-16 MED ORDER — ONDANSETRON HCL 4 MG PO TABS: 4.0000 mg | ORAL_TABLET | Freq: Three times a day (TID) | ORAL | 1 refills | Status: DC | PRN

## 2020-05-16 NOTE — Patient Instructions (Signed)
Please have blood work completed.  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  Once evaluated by Neurology, we can pursue the upper endoscopy with Dr. Jena Gauss. You will not take metformin the morning of the procedure, and only take 1/2 dose of Lantus the evening prior.   I have increased pantoprazole to twice a day, 30 minutes before breakfast and dinner. I have also sent in Zofran to take as needed for nausea. Continue Reglan three times a day before meals for now.   We will see you in 2 months!  I enjoyed seeing you again today! As you know, I value our relationship and want to provide genuine, compassionate, and quality care. I welcome your feedback. If you receive a survey regarding your visit,  I greatly appreciate you taking time to fill this out. See you next time!  Gelene Mink, PhD, ANP-BC [facility]

## 2020-05-16 NOTE — Progress Notes (Signed)
Referring Provider: Rodena Goldmann, DO Primary Care Physician:  Minna Antis, DO Primary GI: Dr. Gala Romney   Chief Complaint  Patient presents with  . Follow-up    F/U hosp, nausea, tumors on spleen, wt loss    HPI:   Jose Koch is a 50 y.o. 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 Concern for coffee-ground emesis on admission. Hgb 14 on admission and down to 10.7 while inpatient. He was also found to have an abnormal spleen on CT with splenomegaly and multiple hypodense splenic lesions with recommendations for PET scan. Thrombocytopenia also noted. He had a right parenchymal hemorrhage after fall prior to admission, and repeat CT head without contrast with stable findings. He was to follow-up with Neuro as outpatient.   Here today to discuss EGD. Mother-in-law present with him today. No vomiting recently. Nausea has been peaking every once in awhile. Appetite is fair. Eats about 2 meals per day. Dietitian has him on a schedule. No postprandial pain. Usually in mornings will feel nausea and a ball in his stomach. Present since Covid. Has difficulty with liquids if drinking with his head back but no solid food dysphagia. Pantoprazole daily right now. Reglan 10 mg before meals. A1c 12 in hospital. No melena or hematochezia. Rare constipation but for most part fairly normal.   No prior colonoscopy or upper endoscopy. Family history unknown.   Thrombocytopenia noted dating back to 2017 through [place].   Past Medical History:  Diagnosis Date  . COVID   . Diabetes mellitus without complication ([place])   . High cholesterol   . Hypertension   . Ichthyosis vulgaris     Past Surgical History:  Procedure Laterality Date  . TONSILLECTOMY    . wisdom teeth removal      Current Outpatient Medications  Medication Sig Dispense Refill  . blood glucose meter kit and supplies KIT  Dispense based on patient and insurance preference. Use up to four times daily as directed. (FOR ICD-9 250.00, 250.01). 1 each 0  . cholecalciferol (VITAMIN D3) 25 MCG (1000 UNIT) tablet Take 1,000 Units by mouth daily.    Marland Kitchen glucose blood (ACCU-CHEK GUIDE) test strip Use as instructed to monitor glucose twice daily, before breakfast and before bed 100 each 12  . insulin glargine (LANTUS) 100 UNIT/ML Solostar Pen Inject 30 Units into the skin daily. (Patient taking differently: Inject 20 Units into the skin daily.) 15 mL 11  . Insulin Pen Needle 31G X 5 MM MISC 1 Units by Does not apply route daily. 100 each 1  . metFORMIN (GLUCOPHAGE XR) 500 MG 24 hr tablet Take 1 tablet (500 mg total) by mouth daily with breakfast. 90 tablet 3  . metoCLOPramide (REGLAN) 10 MG tablet Take 1 tablet (10 mg total) by mouth 3 (three) times daily with meals. 90 tablet 1  . ondansetron (ZOFRAN) 4 MG tablet Take 1 tablet (4 mg total) by mouth every 8 (eight) hours as needed for nausea or vomiting. 30 tablet 1  . OneTouch Delica Lancets 47M MISC PLEASE SEE ATTACHED FOR DETAILED DIRECTIONS    . vitamin C (ASCORBIC ACID) 500 MG tablet Take 500 mg by mouth daily.    . pantoprazole (PROTONIX) 40 MG tablet Take 1 tablet (40 mg total) by mouth 2 (two) times daily before a meal. 30 minutes before breakfast and dinner 60 tablet 3   No current facility-administered medications  for this visit.    Allergies as of 05/16/2020  . (No Known Allergies)    Family History  Family history unknown: Yes    Social History   Socioeconomic History  . Marital status: Single    Spouse name: Not on file  . Number of children: Not on file  . Years of education: Not on file  . Highest education level: Not on file  Occupational History  . Not on file  Tobacco Use  . Smoking status: Never Smoker  . Smokeless tobacco: Never Used  Substance and Sexual Activity  . Alcohol use: Not Currently  . Drug use: Never  . Sexual activity: Not on  file  Other Topics Concern  . Not on file  Social History Narrative  . Not on file   Social Determinants of Health   Financial Resource Strain: Not on file  Food Insecurity: Not on file  Transportation Needs: Not on file  Physical Activity: Not on file  Stress: Not on file  Social Connections: Not on file    Review of Systems: Gen: Denies fever, chills, anorexia. Denies fatigue, weakness, weight loss.  CV: Denies chest pain, palpitations, syncope, peripheral edema, and claudication. Resp: Denies dyspnea at rest, cough, wheezing, coughing up blood, and pleurisy. 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 No homicidal or suicidal ideation.  Heme: Denies bruising, bleeding, and enlarged lymph nodes.  Physical Exam: BP 110/73   Pulse 99   Temp (!) 96.9 F (36.1 C) (Temporal)   Ht _0  (1.727 m)   Wt 167 lb (75.8 kg)   BMI 25.39 kg/m  General:   Alert and oriented. No distress noted. Pleasant and cooperative. Diffuse scaly/dry skin related to ichthyosis vulgaris Head:  Normocephalic and atraumatic. Eyes:  Conjuctiva clear without scleral icterus. Mouth:  Mask in place Cardiac: S1 S2 present without murmurs Lungs: clear bilaterally Abdomen:  +BS, soft, non-tender and non-distended. No rebound or guarding. No HSM or masses noted. Msk:  Symmetrical without gross deformities. Normal posture. Extremities:  Without edema. Neurologic:  Alert and  oriented x4 Psych:  Alert and cooperative. Normal mood and affect.  ASSESSMENT: Jose Koch is a 50 y.o. 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 Concern for coffee-ground emesis on admission. Hgb 14 on admission and down to 10.7 while inpatient. He was also found to have an abnormal spleen on CT with splenomegaly and multiple hypodense splenic lesions  with recommendations for PET scan. Thrombocytopenia also noted. He had a right parenchymal hemorrhage after fall prior to admission, and repeat CT head without contrast with stable findings. He was to follow-up with Neuro as outpatient. Here for follow-up.  N/V: vomiting resolved but continues with underlying nausea. A1c 12 a month ago. Working with dietician. Likely gastroparesis in setting of uncontrolled diabetes. No further hematemesis, no overt GI bleeding. Continue with Reglan TID: side effects discussed. He is tolerating well. Increase Protonix to BID. Zofran added prn severe nausea.  Splenic lesions: will refer to Hem/Onc. He will likely need PET. He has chronic thrombocytopenia dating back to 2017 through [place] that has never been addressed.   History of right parenchymal hemorrhage: will help facilitate Neuro referral for outpatient follow-up. Need evaluation prior to EGD.   PLAN:  Check CBC, CMP today Referral to Hem/Onc and Neuro After evaluation with Neuro, proceed with upper  endoscopy by Dr. Gala Romney in near future using Propofol: the risks, benefits, and alternatives have been discussed with the patient in detail. The patient states understanding and desires to proceed.  Increase PPI to BID Continue Reglan TID Add Zofran prn Continue follow-up with dietician Follow-up in 2 months   Annitta Needs, PhD, [facility]

## 2020-05-17 LAB — CBC WITH DIFFERENTIAL/PLATELET
Absolute Monocytes: 290 cells/uL (ref 200–950)
Basophils Absolute: 20 cells/uL (ref 0–200)
Basophils Relative: 0.4 %
Eosinophils Absolute: 30 cells/uL (ref 15–500)
Eosinophils Relative: 0.6 %
HCT: 36.7 % — ABNORMAL LOW (ref 38.5–50.0)
Hemoglobin: 12 g/dL — ABNORMAL LOW (ref 13.2–17.1)
Lymphs Abs: 1345 cells/uL (ref 850–3900)
MCH: 27.1 pg (ref 27.0–33.0)
MCHC: 32.7 g/dL (ref 32.0–36.0)
MCV: 83 fL (ref 80.0–100.0)
MPV: 11.5 fL (ref 7.5–12.5)
Monocytes Relative: 5.8 %
Neutro Abs: 3315 cells/uL (ref 1500–7800)
Neutrophils Relative %: 66.3 %
Platelets: 115 10*3/uL — ABNORMAL LOW (ref 140–400)
RBC: 4.42 10*6/uL (ref 4.20–5.80)
RDW: 14.9 % (ref 11.0–15.0)
Total Lymphocyte: 26.9 %
WBC: 5 10*3/uL (ref 3.8–10.8)

## 2020-05-17 LAB — COMPLETE METABOLIC PANEL WITH GFR
AG Ratio: 1.9 (calc) (ref 1.0–2.5)
ALT: 23 U/L (ref 9–46)
AST: 15 U/L (ref 10–40)
Albumin: 4.3 g/dL (ref 3.6–5.1)
Alkaline phosphatase (APISO): 114 U/L (ref 36–130)
BUN/Creatinine Ratio: 21 (calc) (ref 6–22)
BUN: 32 mg/dL — ABNORMAL HIGH (ref 7–25)
CO2: 24 mmol/L (ref 20–32)
Calcium: 8.6 mg/dL (ref 8.6–10.3)
Chloride: 102 mmol/L (ref 98–110)
Creat: 1.56 mg/dL — ABNORMAL HIGH (ref 0.60–1.35)
GFR, Est African American: 60 mL/min/{1.73_m2} (ref >=60)
GFR, Est Non African American: 52 mL/min/{1.73_m2} — ABNORMAL LOW (ref >=60)
Globulin: 2.3 g/dL (calc) (ref 1.9–3.7)
Glucose, Bld: 358 mg/dL — ABNORMAL HIGH (ref 65–99)
Potassium: 4.4 mmol/L (ref 3.5–5.3)
Sodium: 137 mmol/L (ref 135–146)
Total Bilirubin: 0.5 mg/dL (ref 0.2–1.2)
Total Protein: 6.6 g/dL (ref 6.1–8.1)

## 2020-05-19 ENCOUNTER — Encounter: Payer: Self-pay | Admitting: Neurology

## 2020-05-21 ENCOUNTER — Other Ambulatory Visit: Payer: Self-pay | Admitting: *Deleted

## 2020-05-21 DIAGNOSIS — M79606 Pain in leg, unspecified: Secondary | ICD-10-CM

## 2020-05-22 ENCOUNTER — Other Ambulatory Visit: Payer: Self-pay

## 2020-05-22 DIAGNOSIS — E1165 Type 2 diabetes mellitus with hyperglycemia: Secondary | ICD-10-CM

## 2020-05-22 MED ORDER — METFORMIN HCL ER 500 MG PO TB24: 500.0000 mg | ORAL_TABLET | Freq: Every day | ORAL | 0 refills | Status: DC

## 2020-05-26 ENCOUNTER — Encounter (HOSPITAL_COMMUNITY): Payer: Self-pay | Admitting: Surgery

## 2020-05-26 ENCOUNTER — Other Ambulatory Visit: Payer: Self-pay

## 2020-05-26 ENCOUNTER — Encounter (HOSPITAL_COMMUNITY): Payer: Self-pay

## 2020-05-26 NOTE — Progress Notes (Signed)
I have attempted to reach the patient to place an introductory phone call. Unable to reach the patient at this time. I will plan to meet with him during his initial visit with Dr. Ellin Saba

## 2020-05-27 ENCOUNTER — Inpatient Hospital Stay (HOSPITAL_COMMUNITY): Payer: Medicaid - Out of State

## 2020-05-27 ENCOUNTER — Encounter (HOSPITAL_COMMUNITY): Payer: Self-pay | Admitting: General Practice

## 2020-05-27 ENCOUNTER — Telehealth: Payer: Medicaid - Out of State | Admitting: Nutrition

## 2020-05-27 ENCOUNTER — Inpatient Hospital Stay (HOSPITAL_COMMUNITY): Payer: Medicaid - Out of State | Attending: Hematology | Admitting: Hematology

## 2020-05-27 ENCOUNTER — Other Ambulatory Visit: Payer: Self-pay

## 2020-05-27 VITALS — BP 113/72 | HR 87 | Temp 96.7°F | Resp 18 | Ht 68.0 in | Wt 167.4 lb

## 2020-05-27 DIAGNOSIS — R161 Splenomegaly, not elsewhere classified: Secondary | ICD-10-CM | POA: Diagnosis not present

## 2020-05-27 DIAGNOSIS — D696 Thrombocytopenia, unspecified: Secondary | ICD-10-CM

## 2020-05-27 LAB — CBC WITH DIFFERENTIAL/PLATELET
Abs Immature Granulocytes: 0.01 10*3/uL (ref 0.00–0.07)
Basophils Absolute: 0 10*3/uL (ref 0.0–0.1)
Basophils Relative: 0 %
Eosinophils Absolute: 0 10*3/uL (ref 0.0–0.5)
Eosinophils Relative: 1 %
HCT: 37.3 % — ABNORMAL LOW (ref 39.0–52.0)
Hemoglobin: 12.2 g/dL — ABNORMAL LOW (ref 13.0–17.0)
Immature Granulocytes: 0 %
Lymphocytes Relative: 21 %
Lymphs Abs: 1.1 10*3/uL (ref 0.7–4.0)
MCH: 27.1 pg (ref 26.0–34.0)
MCHC: 32.7 g/dL (ref 30.0–36.0)
MCV: 82.9 fL (ref 80.0–100.0)
Monocytes Absolute: 0.2 10*3/uL (ref 0.1–1.0)
Monocytes Relative: 5 %
Neutro Abs: 3.6 10*3/uL (ref 1.7–7.7)
Neutrophils Relative %: 73 %
Platelets: 100 10*3/uL — ABNORMAL LOW (ref 150–400)
RBC: 4.5 MIL/uL (ref 4.22–5.81)
RDW: 13.9 % (ref 11.5–15.5)
WBC: 4.9 10*3/uL (ref 4.0–10.5)
nRBC: 0 % (ref 0.0–0.2)

## 2020-05-27 LAB — VITAMIN B12: Vitamin B-12: 376 pg/mL (ref 180–914)

## 2020-05-27 LAB — HEPATITIS B CORE ANTIBODY, TOTAL: Hep B Core Total Ab: NONREACTIVE

## 2020-05-27 LAB — HEPATITIS B SURFACE ANTIGEN: Hepatitis B Surface Ag: NONREACTIVE

## 2020-05-27 LAB — LACTATE DEHYDROGENASE: LDH: 120 U/L (ref 98–192)

## 2020-05-27 LAB — FOLATE: Folate: 7.5 ng/mL (ref >5.9)

## 2020-05-27 NOTE — Progress Notes (Signed)
[facility] CSW Progress Notes  Request received from Avanell Shackleton, RN to call patient - has transportation difficulties/unmet needs.  Attempted to call patient, no answer, "is not accepting VM right now."  Unable to reach patient.  Per [employer], patient could be assisted w transportation through [employer].  He will need to consent to referral and connect by phone to schedule rides.  CSW will call him next week when back at [facility].  Santa Genera, LCSW Clinical Social Worker Phone:  [phone]

## 2020-05-27 NOTE — Progress Notes (Signed)
[place], [place] [address]   CLINIC:  Medical Oncology/Hematology  Patient Care Team: Minna Antis, DO as PCP - General (Family Medicine) Gala Romney Cristopher Estimable, MD as Consulting Physician (Gastroenterology) Brien Mates, RN as Oncology Nurse Navigator (Oncology)  CHIEF COMPLAINTS/PURPOSE OF CONSULTATION:  Evaluation of thrombocytopenia and splenomegaly  HISTORY OF PRESENTING ILLNESS:  Jose Koch 50 y.o. male is here because of evaluation of thrombocytopenia and splenomegaly, at the request of Roseanne Kaufman, NP, from [facility]. He went to [place] on 04/05/2020 for gastroparesis after having COVID infection along with coffee-ground emesis and a CT scan showed multiple hypoechoic lesions in the spleen.  Today he reports feeling okay. He was informed about his splenomegaly while he was inpatient. He was sick for about 3 weeks in February and lost about 20 lbs; he has started regaining his weight under the direction of his dietician. He was diagnosed with COVID on 03/17/2020. He has vomited about 3 times since discharge due to right inner ear issues, especially when he turns or stands quickly, and has dizziness but denies having nausea. He denies having early satiety and is eating his normal portions. He denies having any sore throat, history of hepatitis or blood transfusion. He has chronic dry skin and trouble regulating his internal body temperature due to an abnormal number of sweat glands. He denies having RA or lupus. He denies having left chest wall pain.  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 He has never smoked and drinks occasionally several times per year. He does not know enough of his family history.   MEDICAL HISTORY:  Past Medical History:  Diagnosis Date  . COVID   . Diabetes mellitus without complication ([place])   . High cholesterol   . Hypertension   . Ichthyosis  vulgaris     SURGICAL HISTORY: Past Surgical History:  Procedure Laterality Date  . TONSILLECTOMY    . wisdom teeth removal      SOCIAL HISTORY: Social History   Socioeconomic History  . Marital status: Divorced    Spouse name: Not on file  . Number of children: Not on file  . Years of education: Not on file  . Highest education level: Not on file  Occupational History  . Occupation: unemployed  Tobacco Use  . Smoking status: Never Smoker  . Smokeless tobacco: Never Used  Vaping Use  . Vaping Use: Never used  Substance and Sexual Activity  . Alcohol use: Not Currently  . Drug use: Never  . Sexual activity: Not on file  Other Topics Concern  . Not on file  Social History Narrative  . Not on file   Social Determinants of Health   Financial Resource Strain: Low Risk   . Difficulty of Paying Living Expenses: Not hard at all  Food Insecurity: No Food Insecurity  . Worried About Charity fundraiser in the Last Year: Never true  . Ran Out of Food in the Last Year: Never true  Transportation Needs: Unmet Transportation Needs  . Lack of Transportation (Medical): Yes  . Lack of Transportation (Non-Medical): Yes  Physical Activity: Sufficiently Active  . Days of Exercise per Week: 7 days  . Minutes of Exercise per Session: 30 min  Stress: Stress Concern Present  . Feeling of Stress : Very much  Social Connections: Socially Isolated  . Frequency of Communication with Friends and Family: More than three  times a week  . Frequency of Social Gatherings with Friends and Family: More than three times a week  . Attends Religious Services: Never  . Active Member of Clubs or Organizations: No  . Attends Archivist Meetings: Never  . Marital Status: Divorced  Human resources officer Violence: Not At Risk  . Fear of Current or Ex-Partner: No  . Emotionally Abused: No  . Physically Abused: No  . Sexually Abused: No    FAMILY HISTORY: Family History  Family history  unknown: Yes    ALLERGIES:  has No Known Allergies.  MEDICATIONS:  Current Outpatient Medications  Medication Sig Dispense Refill  . blood glucose meter kit and supplies KIT Dispense based on patient and insurance preference. Use up to four times daily as directed. (FOR ICD-9 250.00, 250.01). 1 each 0  . cholecalciferol (VITAMIN D3) 25 MCG (1000 UNIT) tablet Take 1,000 Units by mouth daily.    Marland Kitchen glucose blood (ACCU-CHEK GUIDE) test strip Use as instructed to monitor glucose twice daily, before breakfast and before bed 100 each 12  . insulin glargine (LANTUS) 100 UNIT/ML Solostar Pen Inject 30 Units into the skin daily. (Patient taking differently: Inject 20 Units into the skin daily.) 15 mL 11  . Insulin Pen Needle 31G X 5 MM MISC 1 Units by Does not apply route daily. 100 each 1  . metFORMIN (GLUCOPHAGE XR) 500 MG 24 hr tablet Take 1 tablet (500 mg total) by mouth daily with breakfast. 90 tablet 0  . metoCLOPramide (REGLAN) 10 MG tablet Take 1 tablet (10 mg total) by mouth 3 (three) times daily with meals. 90 tablet 1  . ondansetron (ZOFRAN) 4 MG tablet Take 1 tablet (4 mg total) by mouth every 8 (eight) hours as needed for nausea or vomiting. (Patient not taking: Reported on 05/26/2020) 30 tablet 1  . OneTouch Delica Lancets 84X MISC PLEASE SEE ATTACHED FOR DETAILED DIRECTIONS    . pantoprazole (PROTONIX) 40 MG tablet Take 1 tablet (40 mg total) by mouth 2 (two) times daily before a meal. 30 minutes before breakfast and dinner 60 tablet 3  . vitamin C (ASCORBIC ACID) 500 MG tablet Take 500 mg by mouth daily.     No current facility-administered medications for this visit.    REVIEW OF SYSTEMS:   Review of Systems  Constitutional: Positive for appetite change (50%) and fatigue (50%).  Cardiovascular: Negative for chest pain.  Gastrointestinal: Positive for nausea (d/t dizziness) and vomiting.  Skin:       Dry skin  Neurological: Positive for dizziness (R inner ear issues).   Psychiatric/Behavioral: Positive for sleep disturbance.  All other systems reviewed and are negative.    PHYSICAL EXAMINATION: ECOG PERFORMANCE STATUS: 0 - Asymptomatic  Vitals:   05/27/20 1321  BP: 113/72  Pulse: 87  Resp: 18  Temp: (!) 96.7 F (35.9 C)  SpO2: 99%   Filed Weights   05/27/20 1321  Weight: 167 lb 6.4 oz (75.9 kg)   Physical Exam Vitals reviewed.  Constitutional:      Appearance: Normal appearance.  Cardiovascular:     Rate and Rhythm: Normal rate and regular rhythm.     Pulses: Normal pulses.     Heart sounds: Normal heart sounds.  Pulmonary:     Effort: Pulmonary effort is normal.     Breath sounds: Normal breath sounds.  Chest:  Breasts:     Right: No axillary adenopathy or supraclavicular adenopathy.     Left: No axillary adenopathy or supraclavicular  adenopathy.    Abdominal:     Palpations: Abdomen is soft. There is splenomegaly (3 fingerbreaths below costal margin on deep inspiration). There is no hepatomegaly or mass.     Tenderness: There is no abdominal tenderness.     Hernia: No hernia is present.  Lymphadenopathy:     Cervical: No cervical adenopathy.     Upper Body:     Right upper body: No supraclavicular, axillary or pectoral adenopathy.     Left upper body: No supraclavicular, axillary or pectoral adenopathy.     Lower Body: No right inguinal adenopathy. No left inguinal adenopathy.  Skin:    General: Skin is dry.  Neurological:     General: No focal deficit present.     Mental Status: He is alert and oriented to person, place, and time.  Psychiatric:        Mood and Affect: Mood normal.        Behavior: Behavior normal.      LABORATORY DATA:  I have reviewed the data as listed Recent Results (from the past 2160 hour(s))  VITAMIN D 25 Hydroxy (Vit-D Deficiency, Fractures)     Status: Abnormal   Collection Time: 04/14/20  3:51 PM  Result Value Ref Range   Vit D, 25-Hydroxy 15.95 (L) 30 - 100 ng/mL    Comment:  (NOTE) Vitamin D deficiency has been defined by the Institute of Medicine  and an Endocrine Society practice guideline as a level of serum 25-OH  vitamin D less than 20 ng/mL (1,2). The Endocrine Society went on to  further define vitamin D insufficiency as a level between 21 and 29  ng/mL (2).  1. IOM (Institute of Medicine). 2010. Dietary reference intakes for  calcium and D. [place]: The National Academies Press. 2. Holick MF, Binkley Wiley, Bischoff-Ferrari HA, et al. Evaluation,  treatment, and prevention of vitamin D deficiency: an Endocrine  Society clinical practice guideline, JCEM. 2011 Jul; 96(7): 1911-30.  Performed at [facility], [address] [place], [place] [address]   CBC w/Diff/Platelet     Status: Abnormal   Collection Time: 05/16/20  9:44 AM  Result Value Ref Range   WBC 5.0 3.8 - 10.8 Thousand/uL   RBC 4.42 4.20 - 5.80 Million/uL   Hemoglobin 12.0 (L) 13.2 - 17.1 g/dL   HCT 36.7 (L) 38.5 - 50.0 %   MCV 83.0 80.0 - 100.0 fL   MCH 27.1 27.0 - 33.0 pg   MCHC 32.7 32.0 - 36.0 g/dL   RDW 14.9 11.0 - 15.0 %   Platelets 115 (L) 140 - 400 Thousand/uL   MPV 11.5 7.5 - 12.5 fL   Neutro Abs 3,315 1,500 - 7,800 cells/uL   Lymphs Abs 1,345 850 - 3,900 cells/uL   Absolute Monocytes 290 200 - 950 cells/uL   Eosinophils Absolute 30 15 - 500 cells/uL   Basophils Absolute 20 0 - 200 cells/uL   Neutrophils Relative % 66.3 %   Total Lymphocyte 26.9 %   Monocytes Relative 5.8 %   Eosinophils Relative 0.6 %   Basophils Relative 0.4 %  COMPLETE METABOLIC PANEL WITH GFR     Status: Abnormal   Collection Time: 05/16/20  9:44 AM  Result Value Ref Range   Glucose, Bld 358 (H) 65 - 99 mg/dL    Comment: .            Fasting reference interval . For someone without known diabetes, a glucose value >125 mg/dL indicates that they   may have diabetes and this should be confirmed with a follow-up test. .    BUN 32 (H) 7 - 25 mg/dL   Creat 1.56 (H) 0.60 - 1.35 mg/dL    GFR, Est Non African American 52 (L) > OR = 60 mL/min/1.59m   GFR, Est African American 60 > OR = 60 mL/min/1.744m  BUN/Creatinine Ratio 21 6 - 22 (calc)   Sodium 137 135 - 146 mmol/L   Potassium 4.4 3.5 - 5.3 mmol/L   Chloride 102 98 - 110 mmol/L   CO2 24 20 - 32 mmol/L   Calcium 8.6 8.6 - 10.3 mg/dL   Total Protein 6.6 6.1 - 8.1 g/dL   Albumin 4.3 3.6 - 5.1 g/dL   Globulin 2.3 1.9 - 3.7 g/dL (calc)   AG Ratio 1.9 1.0 - 2.5 (calc)   Total Bilirubin 0.5 0.2 - 1.2 mg/dL   Alkaline phosphatase (APISO) 114 36 - 130 U/L   AST 15 10 - 40 U/L   ALT 23 9 - 46 U/L    RADIOGRAPHIC STUDIES: I have personally reviewed the radiological images as listed and agreed with the findings in the report. No results found.  ASSESSMENT:  1.  Splenomegaly with multiple lesions: -Recent hospitalization with nausea and vomiting. -CTAP on 04/05/2020 showed enlarged spleen measuring 15 cm with multiple hypodense soft tissue density lesions measuring 2.3 cm, 2.7 cm.  No significant adenopathy.  Liver is diffusely hypodense consistent with fatty infiltration. -He had weight loss during recent hospitalization and is gaining it back. -No recurrent infections.  2.  Social/family history: -He takes care of his girlfriend.  He worked previously as a blProgrammer, applications He was never smoker.  Rarely drinks alcohol. -Mother was adopted.  Does not know his father.    PLAN:  1.  Splenomegaly with multiple lesions: -Reviewed images of the CT scan.  Spleen is palpable about 3 fingerbreadths below left costal margin on deep inspiration. -No clear B symptoms even though he lost some weight during recent hospitalization and is gaining it back. -We will check LDH.  We will also check flow cytometry for lymphoproliferative disorders. -As there is no reason for his splenomegaly, will order PET CT scan to rule out lymphomatous process. -We will also check for ANA, rheumatoid factor, hepatitis B and C serology, EBV and CMV.  2.   Mild thrombocytopenia: -He has thrombocytopenia since 2017 with platelet count ranging anywhere between 56-1 25. -This is most likely from splenomegaly.  However we will check for nutritional deficiencies, connective tissue disorders and infectious etiologies.  All questions were answered. The patient knows to call the clinic with any problems, questions or concerns.    SrDerek JackMD 05/27/20 1:52 PM  AnParcelas [phone] I, DaMilinda Antisam acting as a scribe for Dr. SrSanda Linger I, SrDerek JackD, have reviewed the above documentation for accuracy and completeness, and I agree with the above.

## 2020-05-27 NOTE — Patient Instructions (Signed)
[facility] at [facility] Discharge Instructions  You were seen and examined today by Dr. Ellin Saba. Dr. Ellin Saba is a medical oncologist and hematologist, meaning he specializes both in cancer diagnoses and blood disorders. Dr. Ellin Saba discussed your past medical history, family history of cancer/blood disorders, and the events that led to you being here today.  You were referred to Dr. Ellin Saba due to an increased size of your spleen. There were also lesions present in your spleen. Dr. Ellin Saba has recommended additional lab work today to see if there is any identifiable cause for increased spleen size on your blood work. Dr. Ellin Saba has also recommended a PET scan. A PET scan is a specialized CT scan that illuminates where cancer is present in your body. Dr. Ellin Saba is attempting to rule out a condition known as Lymphoma.  Follow-up as scheduled.   Thank you for choosing [facility] at [facility] to provide your oncology and hematology care.  To afford each patient quality time with our provider, please arrive at least 15 minutes before your scheduled appointment time.   If you have a lab appointment with the Cancer Center please come in thru the Main Entrance and check in at the main information desk.  You need to re-schedule your appointment should you arrive 10 or more minutes late.  We strive to give you quality time with our providers, and arriving late affects you and other patients whose appointments are after yours.  Also, if you no show three or more times for appointments you may be dismissed from the clinic at the providers discretion.     Again, thank you for choosing [facility].  Our hope is that these requests will decrease the amount of time that you wait before being seen by our physicians.       _____________________________________________________________  Should you have questions after your visit to  [facility], please contact our office at [phone] and follow the prompts.  Our office hours are 8:00 a.m. and 4:30 p.m. Monday - Friday.  Please note that voicemails left after 4:00 p.m. may not be returned until the following business day.  We are closed weekends and major holidays.  You do have access to a nurse 24-7, just call the main number to the clinic [phone] and do not press any options, hold on the line and a nurse will answer the phone.    For prescription refill requests, have your pharmacy contact our office and allow 72 hours.    Due to Covid, you will need to wear a mask upon entering the hospital. If you do not have a mask, a mask will be given to you at the Main Entrance upon arrival. For doctor visits, patients may have 1 support person age 45 or older with them. For treatment visits, patients can not have anyone with them due to social distancing guidelines and our immunocompromised population.

## 2020-05-28 LAB — SURGICAL PATHOLOGY

## 2020-05-28 LAB — HEPATITIS B SURFACE ANTIBODY,QUALITATIVE: Hep B S Ab: NONREACTIVE

## 2020-05-28 LAB — EPSTEIN-BARR VIRUS (EBV) ANTIBODY PROFILE
EBV NA IgG: 94.4 U/mL — ABNORMAL HIGH (ref 0.0–17.9)
EBV VCA IgG: >600 U/mL — ABNORMAL HIGH (ref 0.0–17.9)
EBV VCA IgM: <36 U/mL (ref 0.0–35.9)

## 2020-05-28 LAB — HEPATITIS C ANTIBODY: HCV Ab: NONREACTIVE

## 2020-05-28 LAB — ANTINUCLEAR ANTIBODIES, IFA: ANA Ab, IFA: NEGATIVE

## 2020-05-28 LAB — MISC LABCORP TEST (SEND OUT)
Labcorp test code: 96727
Labcorp test code: 6494

## 2020-05-28 LAB — RHEUMATOID FACTOR: Rheumatoid fact SerPl-aCnc: 14.7 IU/mL — ABNORMAL HIGH (ref <14.0)

## 2020-05-30 LAB — COPPER, SERUM: Copper: 107 ug/dL (ref 69–132)

## 2020-05-30 LAB — METHYLMALONIC ACID, SERUM: Methylmalonic Acid, Quantitative: 398 nmol/L — ABNORMAL HIGH (ref 0–378)

## 2020-05-30 LAB — FLOW CYTOMETRY

## 2020-06-02 ENCOUNTER — Ambulatory Visit (INDEPENDENT_AMBULATORY_CARE_PROVIDER_SITE_OTHER): Payer: Medicaid - Out of State | Admitting: Vascular Surgery

## 2020-06-02 ENCOUNTER — Encounter: Payer: Self-pay | Admitting: Vascular Surgery

## 2020-06-02 ENCOUNTER — Ambulatory Visit (HOSPITAL_COMMUNITY)
Admission: RE | Admit: 2020-06-02 | Discharge: 2020-06-02 | Disposition: A | Discharge status: Home / Self Care | Payer: Medicaid - Out of State | Source: Ambulatory Visit | Attending: Vascular Surgery | Admitting: Vascular Surgery

## 2020-06-02 ENCOUNTER — Other Ambulatory Visit: Payer: Self-pay

## 2020-06-02 VITALS — BP 85/58 | HR 90 | Temp 97.6°F | Resp 20 | Ht 68.0 in | Wt 168.0 lb

## 2020-06-02 DIAGNOSIS — R6889 Other general symptoms and signs: Secondary | ICD-10-CM

## 2020-06-02 DIAGNOSIS — M79606 Pain in leg, unspecified: Secondary | ICD-10-CM

## 2020-06-02 NOTE — Progress Notes (Signed)
Patient ID: Jose Koch, male   DOB: 1970/12/21, 50 y.o.   MRN: [id number]  Reason for Consult: New Patient (Initial Visit)   Referred by Brita Romp, NP  Subjective:     HPI:  Jose Koch is a 50 y.o. male with lower extremity pain.  Risk factors for vascular disease include diabetes, hypertension and high cholesterol.  He has never had any intervention for lower extremity arterial disease.  He denies any previous history of DVT.  He has no new tissue loss or rest pain.  He denies any history of stroke, TIA or amaurosis.  He does not have a personal or family history of aneurysm disease.  He was found to have abnormal ABIs at his endocrinology office he is here for further evaluation.  Past Medical History:  Diagnosis Date  . COVID   . Diabetes mellitus without complication ([place])   . High cholesterol   . Hypertension   . Ichthyosis vulgaris    Family History  Family history unknown: Yes   Past Surgical History:  Procedure Laterality Date  . TONSILLECTOMY    . wisdom teeth removal      Short Social History:  Social History   Tobacco Use  . Smoking status: Never Smoker  . Smokeless tobacco: Never Used  Substance Use Topics  . Alcohol use: Not Currently    No Known Allergies  Current Outpatient Medications  Medication Sig Dispense Refill  . blood glucose meter kit and supplies KIT Dispense based on patient and insurance preference. Use up to four times daily as directed. (FOR ICD-9 250.00, 250.01). 1 each 0  . cholecalciferol (VITAMIN D3) 25 MCG (1000 UNIT) tablet Take 1,000 Units by mouth daily.    Marland Kitchen glucose blood (ACCU-CHEK GUIDE) test strip Use as instructed to monitor glucose twice daily, before breakfast and before bed 100 each 12  . insulin glargine (LANTUS) 100 UNIT/ML Solostar Pen Inject 30 Units into the skin daily. (Patient taking differently: Inject 20 Units into the skin daily.) 15 mL 11  . Insulin Pen Needle 31G X 5 MM MISC 1 Units by Does not  apply route daily. 100 each 1  . metFORMIN (GLUCOPHAGE XR) 500 MG 24 hr tablet Take 1 tablet (500 mg total) by mouth daily with breakfast. 90 tablet 0  . metoCLOPramide (REGLAN) 10 MG tablet Take 1 tablet (10 mg total) by mouth 3 (three) times daily with meals. 90 tablet 1  . OneTouch Delica Lancets 20U MISC PLEASE SEE ATTACHED FOR DETAILED DIRECTIONS    . pantoprazole (PROTONIX) 40 MG tablet Take 1 tablet (40 mg total) by mouth 2 (two) times daily before a meal. 30 minutes before breakfast and dinner 60 tablet 3  . vitamin C (ASCORBIC ACID) 500 MG tablet Take 500 mg by mouth daily.     No current facility-administered medications for this visit.    Review of Systems  Constitutional:  Constitutional negative. HENT: HENT negative.  Eyes: Eyes negative.  Cardiovascular: Cardiovascular negative.  GI: Gastrointestinal negative.  Musculoskeletal: Musculoskeletal negative.  Skin:       Dry skin Neurological: Neurological negative. Hematologic: Hematologic/lymphatic negative.  Psychiatric: Psychiatric negative.        Objective:  Objective   Vitals:   06/02/20 1127  BP: (!) 85/58  Pulse: 90  Resp: 20  Temp: 97.6 F (36.4 C)  SpO2: 97%  Weight: 168 lb (76.2 kg)  Height: 5' 8"  (1.727 m)   Body mass index is 25.54 kg/m.  Physical Exam  HENT:     Head: Normocephalic.     Nose:     Comments: Wearing a mask Eyes:     Pupils: Pupils are equal, round, and reactive to light.  Neck:     Vascular: No carotid bruit.  Cardiovascular:     Pulses:          Femoral pulses are 2+ on the right side and 2+ on the left side.      Dorsalis pedis pulses are 2+ on the right side and 0 on the left side.     Heart sounds: Normal heart sounds.  Pulmonary:     Effort: Pulmonary effort is normal.  Abdominal:     General: Abdomen is flat.     Palpations: Abdomen is soft. There is no mass.  Musculoskeletal:        General: Normal range of motion.     Cervical back: Normal range of motion and  neck supple.  Skin:    General: Skin is dry.     Capillary Refill: Capillary refill takes less than 2 seconds.  Neurological:     General: No focal deficit present.     Mental Status: He is alert.  Psychiatric:        Mood and Affect: Mood normal.        Thought Content: Thought content normal.        Judgment: Judgment normal.     Data: ABI Findings:  +---------+------------------+-----+---------+--------+  Right  Rt Pressure (mmHg)IndexWaveform Comment   +---------+------------------+-----+---------+--------+  Brachial 125                      +---------+------------------+-----+---------+--------+  ATA   129        1.03 triphasic      +---------+------------------+-----+---------+--------+  PTA   131        1.05 triphasic      +---------+------------------+-----+---------+--------+  Great Toe134        1.07 Normal        +---------+------------------+-----+---------+--------+   +---------+------------------+-----+---------+-------+  Left   Lt Pressure (mmHg)IndexWaveform Comment  +---------+------------------+-----+---------+-------+  Brachial 125                      +---------+------------------+-----+---------+-------+  ATA   94        0.75 triphasic      +---------+------------------+-----+---------+-------+  PTA   100        0.80 triphasic      +---------+------------------+-----+---------+-------+  Great Toe104        0.83 Normal        +---------+------------------+-----+---------+-------+       Assessment/Plan:     50 year old male sent for evaluation of abnormal ABIs.  He has triphasic waveforms throughout his bilateral extremities and is mostly without symptoms however he does have mildly decreased ABIs on the left and pulses are nonpalpable on the left although they are on the right.   Given this we will have him follow-up in 1 year with repeat ABIs.  He has issues before that we can certainly see him sooner.     Waynetta Sandy MD [facility]

## 2020-06-03 ENCOUNTER — Inpatient Hospital Stay (HOSPITAL_COMMUNITY): Payer: Medicaid - Out of State | Admitting: General Practice

## 2020-06-03 DIAGNOSIS — D696 Thrombocytopenia, unspecified: Secondary | ICD-10-CM

## 2020-06-03 NOTE — Progress Notes (Signed)
[facility] CSW Progress Notes  Unable to reach patient, spoke w girlfriend.  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  CSW will send referral to [facility] for [facility] related appointments - current address is [address], [place] [address].  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    Santa Genera, LCSW Clinical Social Worker Phone:  [phone]

## 2020-06-11 ENCOUNTER — Ambulatory Visit: Payer: Medicaid - Out of State | Admitting: Nutrition

## 2020-06-12 ENCOUNTER — Telehealth: Payer: Self-pay | Admitting: *Deleted
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
## 2020-06-12 NOTE — Telephone Encounter (Signed)
We have orders for patient to have EGD with propofol, Dr. Jena Gauss (no ASA listed). Please advise Tobi Bastos thanks  Also patient scheduled to see neuro 4/22.

## 2020-06-13 ENCOUNTER — Encounter: Payer: Self-pay | Admitting: Neurology

## 2020-06-13 ENCOUNTER — Ambulatory Visit (INDEPENDENT_AMBULATORY_CARE_PROVIDER_SITE_OTHER): Payer: PRIVATE HEALTH INSURANCE | Admitting: Neurology

## 2020-06-13 ENCOUNTER — Other Ambulatory Visit: Payer: Self-pay

## 2020-06-13 VITALS — BP 96/63 | HR 88 | Ht 68.0 in | Wt 169.0 lb

## 2020-06-13 DIAGNOSIS — I618 Other nontraumatic intracerebral hemorrhage: Secondary | ICD-10-CM

## 2020-06-13 NOTE — Patient Instructions (Signed)
1.  Check MRI and MRA of brain  2.  Further recommendations pending results.

## 2020-06-16 ENCOUNTER — Encounter (HOSPITAL_COMMUNITY): Payer: Self-pay

## 2020-06-16 ENCOUNTER — Other Ambulatory Visit: Payer: Self-pay

## 2020-06-16 ENCOUNTER — Encounter (HOSPITAL_COMMUNITY)
Admission: RE | Admit: 2020-06-16 | Discharge: 2020-06-16 | Disposition: A | Discharge status: Home / Self Care | Payer: Medicaid - Out of State | Source: Ambulatory Visit | Attending: Hematology | Admitting: Hematology

## 2020-06-16 DIAGNOSIS — D696 Thrombocytopenia, unspecified: Secondary | ICD-10-CM | POA: Insufficient documentation

## 2020-06-16 DIAGNOSIS — R161 Splenomegaly, not elsewhere classified: Secondary | ICD-10-CM | POA: Insufficient documentation

## 2020-06-19 ENCOUNTER — Ambulatory Visit (HOSPITAL_COMMUNITY): Payer: Medicaid - Out of State | Admitting: Hematology

## 2020-06-29 ENCOUNTER — Other Ambulatory Visit: Payer: Medicaid - Out of State

## 2020-06-30 ENCOUNTER — Other Ambulatory Visit: Payer: Self-pay

## 2020-06-30 ENCOUNTER — Encounter (HOSPITAL_COMMUNITY)
Admission: RE | Admit: 2020-06-30 | Discharge: 2020-06-30 | Disposition: A | Discharge status: Home / Self Care | Payer: Medicaid - Out of State | Source: Ambulatory Visit | Attending: Hematology | Admitting: Hematology

## 2020-06-30 DIAGNOSIS — R161 Splenomegaly, not elsewhere classified: Secondary | ICD-10-CM | POA: Diagnosis present

## 2020-06-30 DIAGNOSIS — D696 Thrombocytopenia, unspecified: Secondary | ICD-10-CM | POA: Insufficient documentation

## 2020-06-30 IMAGING — PT NM PET TUM IMG INITIAL (PI) SKULL BASE T - THIGH
6 series · 26 of 52 positions shown · non-contrast
Comparison: [DATE] abdominopelvic CT. Chest radiograph of
[DATE] from [HOSPITAL] [facility] is reviewed

CLINICAL DATA: Initial treatment strategy for splenomegaly. Rule
out hematologic malignancy.

EXAM:
NUCLEAR MEDICINE PET SKULL BASE TO THIGH
TECHNIQUE: 9.4 mCi F-18 FDG was injected intravenously. Full-ring PET imaging
was performed from the skull base to thigh after the radiotracer. CT
data was obtained and used for attenuation correction and anatomic
localization.
Fasting blood glucose: 296 mg/dl

[Series 3: ct wb fusion · axial · 5.0mm · 0.98mm/px · z∈[-1314,-671]mm · 3 of 387 slices shown]
[im 65/387]
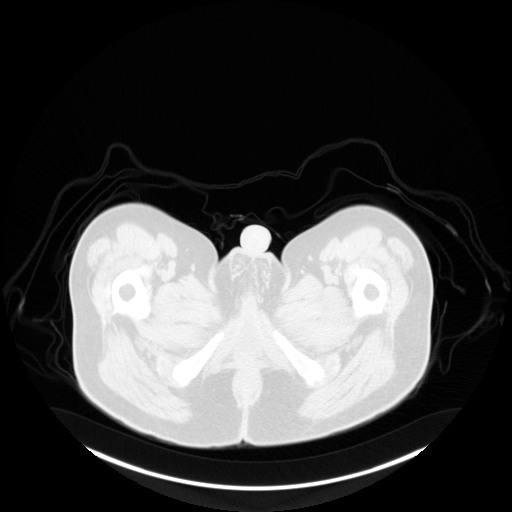
[im 194/387  brain]
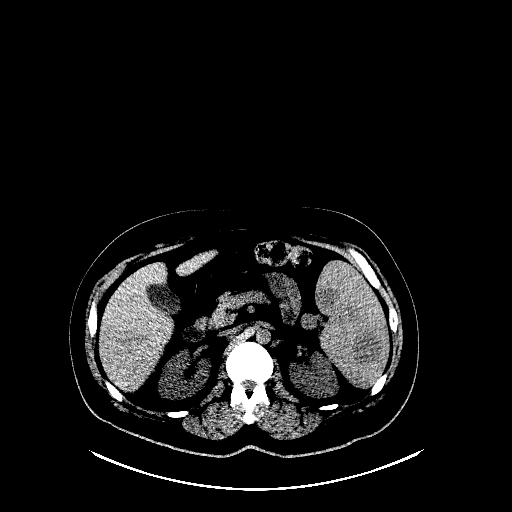
[im 322/387]
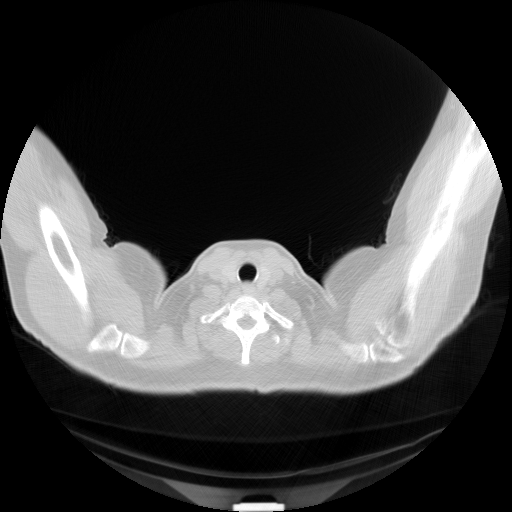

[Series 4: pet wb · axial · 5.0mm · 4.11mm/px · z∈[-1474,-508]mm · 4 of 387 slices shown]
[im 1/387]
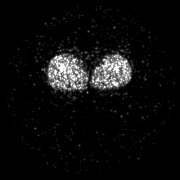
[im 129/387]
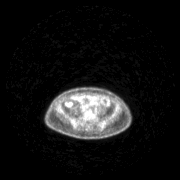
[im 258/387]
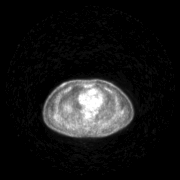
[im 387/387]
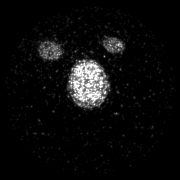

[Series 5: pet wb uncorrected · axial · 5.0mm · 4.11mm/px · z∈[-1336,-508]mm · 4 of 387 slices shown]
[im 56/387]
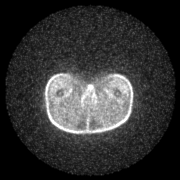
[im 166/387]
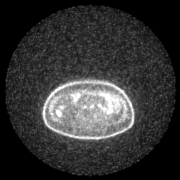
[im 276/387]
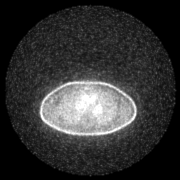
[im 387/387]
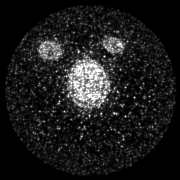

[axial ct wb fusion · 5 of 191 frames shown]
[frame 22/191]
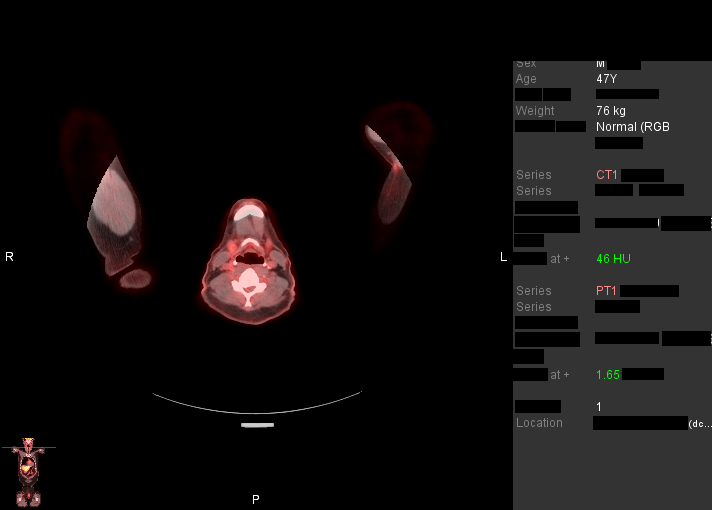
[frame 64/191]
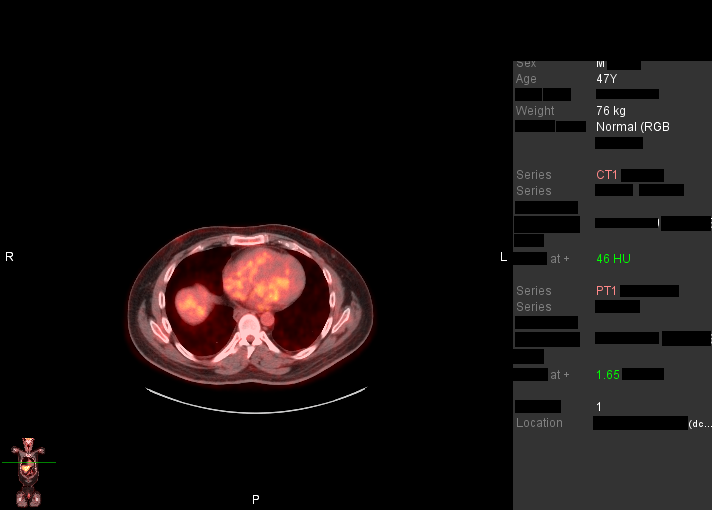
[frame 106/191]
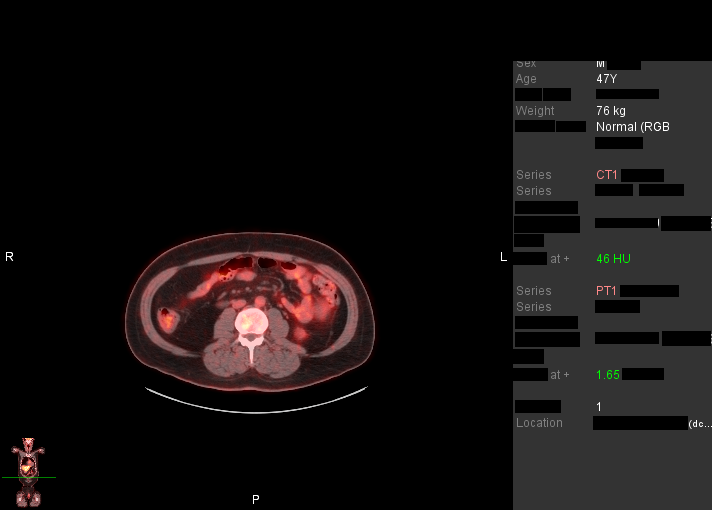
[frame 148/191]
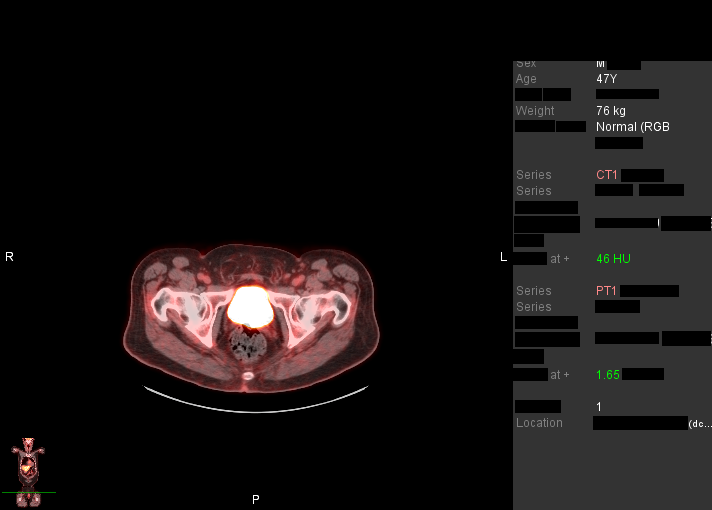
[frame 191/191]
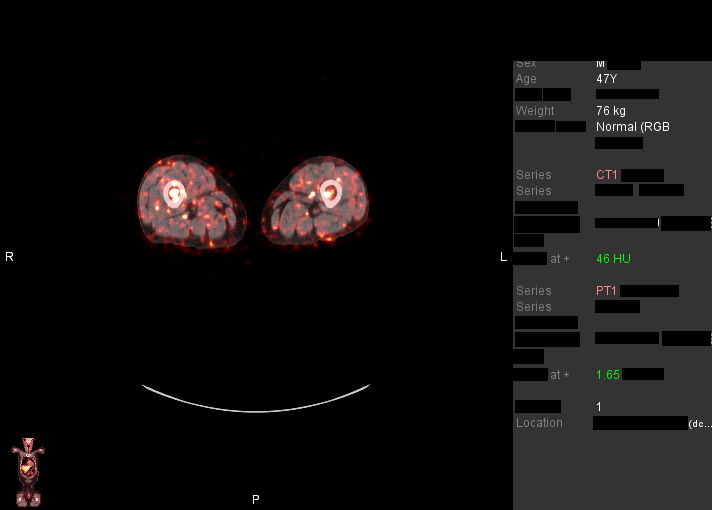

[coronal ct wb fusion · 5 of 18 frames shown]
[frame 2/18]
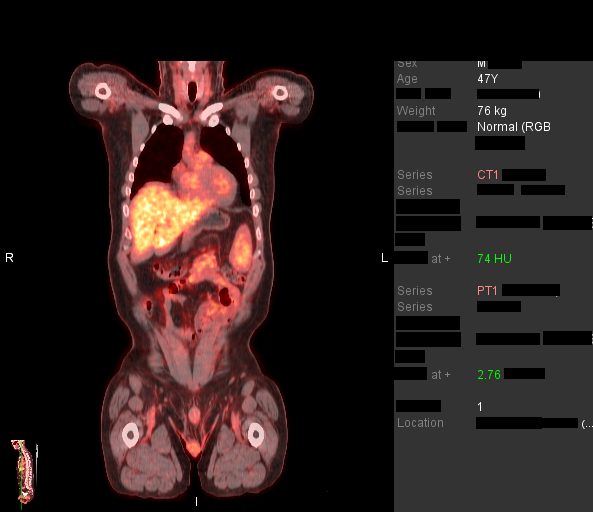
[frame 6/18]
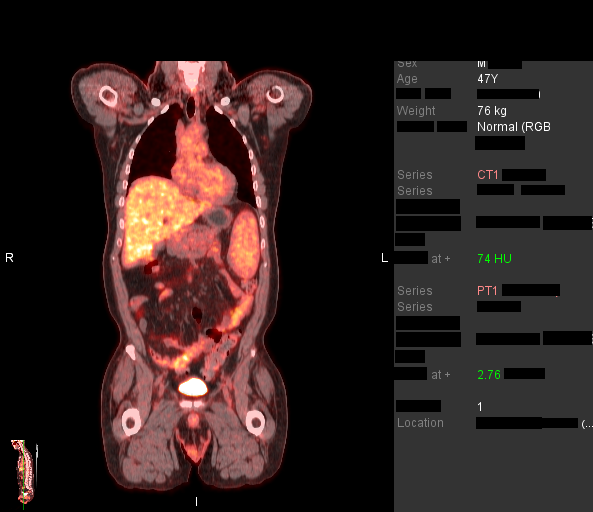
[frame 10/18]
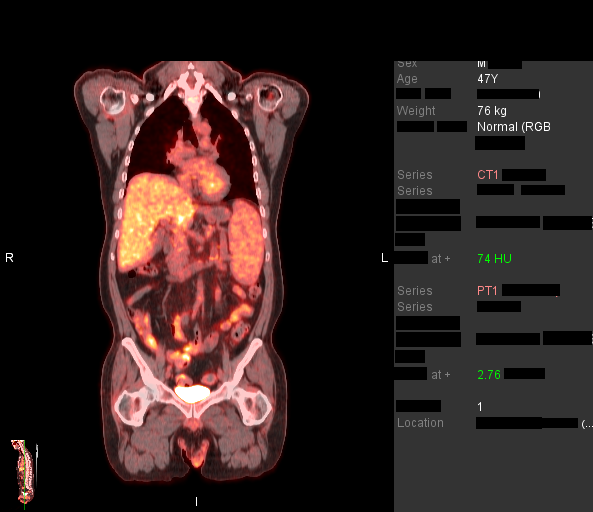
[frame 14/18]
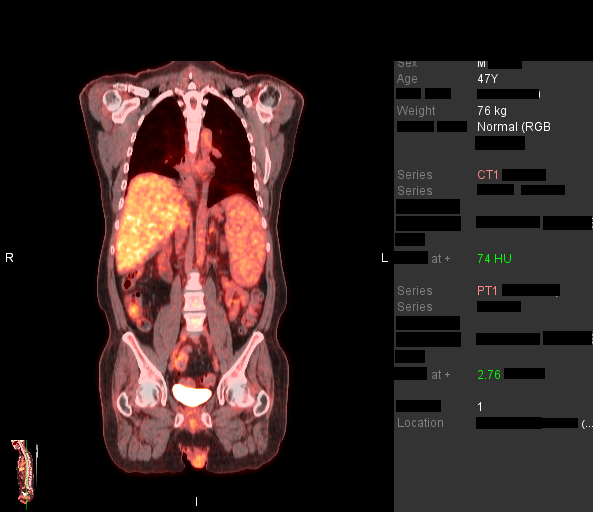
[frame 18/18]
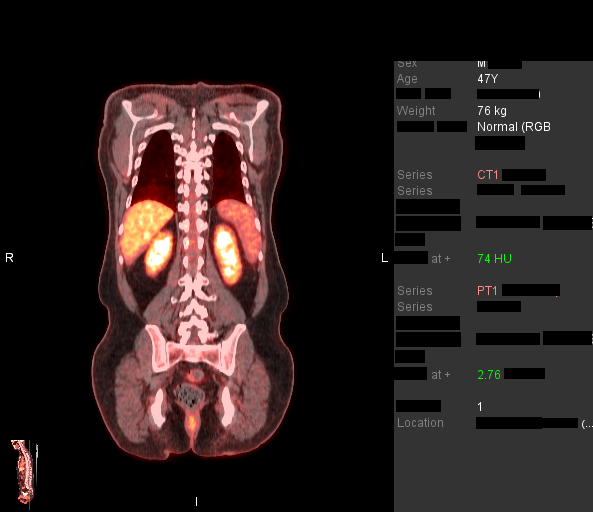

[mip · 5 of 48 frames shown]
[frame 6/48]
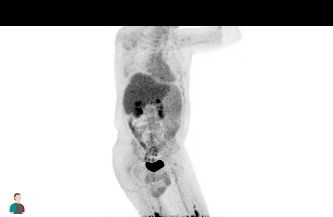
[frame 16/48]
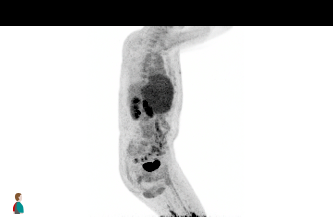
[frame 27/48]
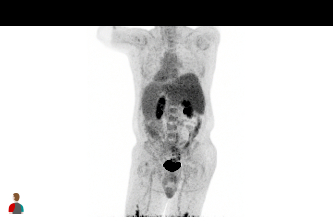
[frame 37/48]
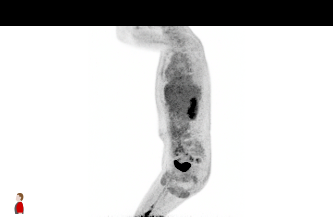
[frame 48/48]
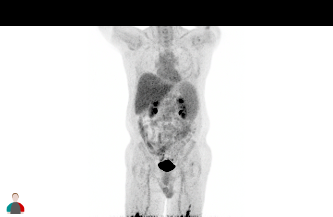

[26 of 52 positions shown; findings below may reference images not displayed]

FINDINGS: Mediastinal blood pool activity: SUV max

Liver activity: SUV max NA

NECK: Bilateral small, mildly hypermetabolic level 2 nodes. Example
left-sided node measures 6 mm and a S.U.V. max of 2.6 on 37/3.

Left submandibular hypermetabolism may correspond to a small node
including at a S.U.V. max of 4.1 on 43/3.

Incidental CT findings: No cervical adenopathy. Right maxillary
sinus mucosal thickening.

CHEST: No pulmonary parenchymal or thoracic nodal hypermetabolism.

Incidental CT findings: Aortic atherosclerosis. Subtle right
coronary artery calcification suspected on 126/3. No thoracic
adenopathy.

ABDOMEN/PELVIS: No focal or diffuse splenic hypermetabolism. No
abdominopelvic nodal hypermetabolism.

Incidental CT findings: The splenic lesions on contrast enhanced CT
are subtly apparent. Example inferior splenic lesion of 2.8 cm on
194/3 versus 2.7 cm on the prior. More anterior inferior splenic
lesion measures 2.2 cm on 195/3 versus 2.3 cm on the prior.

Normal adrenal glands. Aortic atherosclerosis. No abdominopelvic
adenopathy. Fat containing right inguinal hernia.

SKELETON: No abnormal marrow activity.

Incidental CT findings: none
IMPRESSION: 1. No splenic hypermetabolism to correspond to the CT abnormalities.
Differential considerations include non FDG avid lymphoproliferative
process or benign splenic lesions such as hemangiomas or
lymphangiomas.
2. Mildly hypermetabolic small bilateral cervical nodes are
indeterminate. Consider neck CT follow-up at 3 months to confirm
size stability. Sampling would likely be challenging secondary to
nodal size and location.
3. Probable significantly age advanced coronary artery
atherosclerosis. Recommend assessment of coronary risk factors and
consideration of medical therapy.
4.  Aortic Atherosclerosis ([BF]-[BF]).

## 2020-06-30 MED ORDER — FLUDEOXYGLUCOSE F - 18 (FDG) INJECTION
9.3900 | Freq: Once | INTRAVENOUS | Status: AC | PRN
  Administered 2020-06-30: 9.39 via INTRAVENOUS

## 2020-06-30 NOTE — Telephone Encounter (Signed)
He hasn't been cleared completely from Neuro. They have ordered MRI and MRA of brain. Please have him keep his appt with me for 5/25.

## 2020-06-30 NOTE — Telephone Encounter (Signed)
noted 

## 2020-07-03 ENCOUNTER — Other Ambulatory Visit: Payer: Self-pay

## 2020-07-03 ENCOUNTER — Inpatient Hospital Stay (HOSPITAL_COMMUNITY): Payer: Medicaid - Out of State | Attending: Hematology | Admitting: Hematology

## 2020-07-03 VITALS — BP 112/69 | HR 78 | Temp 97.0°F | Resp 18 | Wt 169.0 lb

## 2020-07-03 DIAGNOSIS — D696 Thrombocytopenia, unspecified: Secondary | ICD-10-CM | POA: Insufficient documentation

## 2020-07-03 DIAGNOSIS — R161 Splenomegaly, not elsewhere classified: Secondary | ICD-10-CM | POA: Diagnosis not present

## 2020-07-03 NOTE — Progress Notes (Signed)
[place], [place] [address]   CLINIC:  Medical Oncology/Hematology  PCP:  Jose Antis, DO [address] / [place] [address]  [phone]  REASON FOR VISIT:  Follow-up for thrombocytopenia and splenomegaly  PRIOR THERAPY: none  CURRENT THERAPY: under work-up  INTERVAL HISTORY:  [name], a 50 y.o. male, returns for routine follow-up for his thrombocytopenia and splenomegaly. Jose Koch was last seen on 05/27/2020.  Today he reports feeling okay. He denies any recent dental infections.   REVIEW OF SYSTEMS:  Review of Systems  Constitutional: Positive for appetite change (75%) and fatigue (60%).  Gastrointestinal: Positive for nausea.  Neurological: Positive for dizziness (inner ear).  Psychiatric/Behavioral: Positive for sleep disturbance.  All other systems reviewed and are negative.   PAST MEDICAL/SURGICAL HISTORY:  Past Medical History:  Diagnosis Date  . COVID   . Diabetes mellitus without complication ([place])   . High cholesterol   . Hypertension   . Ichthyosis vulgaris    Past Surgical History:  Procedure Laterality Date  . TONSILLECTOMY    . wisdom teeth removal      SOCIAL HISTORY:  Social History   Socioeconomic History  . Marital status: Divorced    Spouse name: Not on file  . Number of children: Not on file  . Years of education: Not on file  . Highest education level: Not on file  Occupational History  . Occupation: unemployed  Tobacco Use  . Smoking status: Never Smoker  . Smokeless tobacco: Never Used  Vaping Use  . Vaping Use: Never used  Substance and Sexual Activity  . Alcohol use: Not Currently  . Drug use: Never  . Sexual activity: Not on file  Other Topics Concern  . Not on file  Social History Narrative   Right handed    Social Determinants of Health   Financial Resource Strain: Low Risk   . Difficulty of Paying Living Expenses: Not hard at all  Food Insecurity: No  Food Insecurity  . Worried About Charity fundraiser in the Last Year: Never true  . Ran Out of Food in the Last Year: Never true  Transportation Needs: Unmet Transportation Needs  . Lack of Transportation (Medical): Yes  . Lack of Transportation (Non-Medical): Yes  Physical Activity: Sufficiently Active  . Days of Exercise per Week: 7 days  . Minutes of Exercise per Session: 30 min  Stress: Stress Concern Present  . Feeling of Stress : Very much  Social Connections: Socially Isolated  . Frequency of Communication with Friends and Family: More than three times a week  . Frequency of Social Gatherings with Friends and Family: More than three times a week  . Attends Religious Services: Never  . Active Member of Clubs or Organizations: No  . Attends Archivist Meetings: Never  . Marital Status: Divorced  Human resources officer Violence: Not At Risk  . Fear of Current or Ex-Partner: No  . Emotionally Abused: No  . Physically Abused: No  . Sexually Abused: No    FAMILY HISTORY:  Family History  Family history unknown: Yes    CURRENT MEDICATIONS:  Current Outpatient Medications  Medication Sig Dispense Refill  . blood glucose meter kit and supplies KIT Dispense based on patient and insurance preference. Use up to four times daily as directed. (FOR ICD-9 250.00, 250.01). 1 each 0  . cholecalciferol (VITAMIN D3) 25 MCG (1000 UNIT) tablet Take 1,000 Units by mouth daily.    Marland Kitchen  glucose blood (ACCU-CHEK GUIDE) test strip Use as instructed to monitor glucose twice daily, before breakfast and before bed 100 each 12  . insulin glargine (LANTUS) 100 UNIT/ML Solostar Pen Inject 30 Units into the skin daily. 15 mL 11  . Insulin Pen Needle 31G X 5 MM MISC 1 Units by Does not apply route daily. 100 each 1  . metFORMIN (GLUCOPHAGE XR) 500 MG 24 hr tablet Take 1 tablet (500 mg total) by mouth daily with breakfast. 90 tablet 0  . metoCLOPramide (REGLAN) 10 MG tablet Take 1 tablet (10 mg total) by  mouth 3 (three) times daily with meals. 90 tablet 1  . OneTouch Delica Lancets 14E MISC PLEASE SEE ATTACHED FOR DETAILED DIRECTIONS    . pantoprazole (PROTONIX) 40 MG tablet Take 1 tablet (40 mg total) by mouth 2 (two) times daily before a meal. 30 minutes before breakfast and dinner 60 tablet 3  . vitamin C (ASCORBIC ACID) 500 MG tablet Take 500 mg by mouth daily.     No current facility-administered medications for this visit.    ALLERGIES:  No Known Allergies  PHYSICAL EXAM:  Performance status (ECOG): 0 - Asymptomatic  There were no vitals filed for this visit. Wt Readings from Last 3 Encounters:  06/13/20 169 lb (76.7 kg)  06/02/20 168 lb (76.2 kg)  05/27/20 167 lb 6.4 oz (75.9 kg)   Physical Exam Vitals reviewed.  Constitutional:      Appearance: Normal appearance.  Cardiovascular:     Rate and Rhythm: Normal rate and regular rhythm.     Pulses: Normal pulses.     Heart sounds: Normal heart sounds.  Pulmonary:     Effort: Pulmonary effort is normal.     Breath sounds: Normal breath sounds.  Abdominal:     Palpations: Abdomen is soft. There is splenomegaly (improved; 2 finger widths below costal margin on inspiration). There is no hepatomegaly or mass.     Tenderness: There is no abdominal tenderness.  Neurological:     General: No focal deficit present.     Mental Status: He is alert and oriented to person, place, and time.  Psychiatric:        Mood and Affect: Mood normal.        Behavior: Behavior normal.     LABORATORY DATA:  I have reviewed the labs as listed.  CBC Latest Ref Rng & Units 05/27/2020 05/16/2020 04/14/2020  WBC 4.0 - 10.5 K/uL 4.9 5.0 3.9(L)  Hemoglobin 13.0 - 17.0 g/dL 12.2(L) 12.0(L) 10.7(L)  Hematocrit 39.0 - 52.0 % 37.3(L) 36.7(L) 32.3(L)  Platelets 150 - 400 K/uL 100(L) 115(L) 99(L)   CMP Latest Ref Rng & Units 05/16/2020 04/07/2020 04/06/2020  Glucose 65 - 99 mg/dL 358(H) 109(H) 183(H)  BUN 7 - 25 mg/dL 32(H) 16 23(H)  Creatinine 0.60 -  1.35 mg/dL 1.56(H) 1.22 1.29(H)  Sodium 135 - 146 mmol/L 137 136 135  Potassium 3.5 - 5.3 mmol/L 4.4 3.0(L) 3.6  Chloride 98 - 110 mmol/L 102 102 97(L)  CO2 20 - 32 mmol/L _0 Calcium 8.6 - 10.3 mg/dL 8.6 8.2(L) 8.6(L)  Total Protein 6.1 - 8.1 g/dL 6.6 5.7(L) 6.7  Total Bilirubin 0.2 - 1.2 mg/dL 0.5 1.0 1.6(H)  Alkaline Phos 38 - 126 U/L - 72 88  AST 10 - 40 U/L _1 ALT 9 - 46 U/L _2 Component Value Date/Time   RBC 4.50 05/27/2020 1351   MCV  82.9 05/27/2020 1351   MCH 27.1 05/27/2020 1351   MCHC 32.7 05/27/2020 1351   RDW 13.9 05/27/2020 1351   LYMPHSABS 1.1 05/27/2020 1351   MONOABS 0.2 05/27/2020 1351   EOSABS 0.0 05/27/2020 1351   BASOSABS 0.0 05/27/2020 1351    DIAGNOSTIC IMAGING:  I have independently reviewed the scans and discussed with the patient. NM PET Image Initial (PI) Skull Base To Thigh  Result Date: 07/01/2020 CLINICAL DATA:  Initial treatment strategy for splenomegaly. Rule out hematologic malignancy. EXAM: NUCLEAR MEDICINE PET SKULL BASE TO THIGH TECHNIQUE: 9.4 mCi F-18 FDG was injected intravenously. Full-ring PET imaging was performed from the skull base to thigh after the radiotracer. CT data was obtained and used for attenuation correction and anatomic localization. Fasting blood glucose: 296 mg/dl COMPARISON:  04/05/2020 abdominopelvic CT. Chest radiograph of 03/17/2020 from [facility] is reviewed FINDINGS: Mediastinal blood pool activity: SUV max 2.4 Liver activity: SUV max NA NECK: Bilateral small, mildly hypermetabolic level 2 nodes. Example left-sided node measures 6 mm and a S.U.V. max of 2.6 on 37/3. Left submandibular hypermetabolism may correspond to a small node including at a S.U.V. max of 4.1 on 43/3. Incidental CT findings: No cervical adenopathy. Right maxillary sinus mucosal thickening. CHEST: No pulmonary parenchymal or thoracic nodal hypermetabolism. Incidental CT findings: Aortic atherosclerosis. Subtle right coronary  artery calcification suspected on 126/3. No thoracic adenopathy. ABDOMEN/PELVIS: No focal or diffuse splenic hypermetabolism. No abdominopelvic nodal hypermetabolism. Incidental CT findings: The splenic lesions on contrast enhanced CT are subtly apparent. Example inferior splenic lesion of 2.8 cm on 194/3 versus 2.7 cm on the prior. More anterior inferior splenic lesion measures 2.2 cm on 195/3 versus 2.3 cm on the prior. Normal adrenal glands. Aortic atherosclerosis. No abdominopelvic adenopathy. Fat containing right inguinal hernia. SKELETON: No abnormal marrow activity. Incidental CT findings: none IMPRESSION: 1. No splenic hypermetabolism to correspond to the CT abnormalities. Differential considerations include non FDG avid lymphoproliferative process or benign splenic lesions such as hemangiomas or lymphangiomas. 2. Mildly hypermetabolic small bilateral cervical nodes are indeterminate. Consider neck CT follow-up at 3 months to confirm size stability. Sampling would likely be challenging secondary to nodal size and location. 3. Probable significantly age advanced coronary artery atherosclerosis. Recommend assessment of coronary risk factors and consideration of medical therapy. 4.  Aortic Atherosclerosis (ICD10-I70.0). Electronically Signed   By: Abigail Miyamoto M.D.   On: 07/01/2020 14:31     ASSESSMENT:  1.  Splenomegaly with multiple lesions: -Recent hospitalization with nausea and vomiting. -CTAP on 04/05/2020 showed enlarged spleen measuring 15 cm with multiple hypodense soft tissue density lesions measuring 2.3 cm, 2.7 cm.  No significant adenopathy.  Liver is diffusely hypodense consistent with fatty infiltration. -He had weight loss during recent hospitalization and is gaining it back. -No recurrent infections.  2.  Social/family history: -He takes care of his girlfriend.  He worked previously as a Programmer, applications.  He was never smoker.  Rarely drinks alcohol. -Mother was adopted.  Does not know  his father.   PLAN:  1.  Splenomegaly with multiple lesions: -PET scan on 06/30/2020 images were reviewed with him. - Mild hypermetabolic small bilateral cervical lymph nodes, indeterminate.  Clinically not palpable. - No splenic hypermetabolism.  Spleen size slightly improved to 2 fingerbreadths below the left costal margin on inspiration. - EBV serology consistent with previous infection.  CMV was negative.  Other nutritional deficiency work-up for thrombocytopenia was negative. - Methylmalonic acid was slightly elevated.  Recommend vitamin B12 1 mg tablet daily. -  Recommend follow-up in 3 months with repeat CT scan of the neck with contrast.  Also recommend CTAP with oral contrast for follow-up of spleen size.  2.  Mild thrombocytopenia: -He has mild thrombocytopenia since 2017 with platelet count ranging anywhere between 56-1 25. - Most likely from splenomegaly. - Reviewed PET scan from 06/30/2020 which did not show any hypermetabolism in the spleen.  Differential diagnosis includes non-FDG-avid lymphoproliferative process.  Contrast-enhancement of splenic lesions on CT could be from benign hemangiomas or lymphangiomas.  Orders placed this encounter:  No orders of the defined types were placed in this encounter.    Derek Jack, MD [place] [phone]   I, Thana Ates, am acting as a scribe for Dr. Sanda Linger.  I, Derek Jack MD, have reviewed the above documentation for accuracy and completeness, and I agree with the above.

## 2020-07-03 NOTE — Patient Instructions (Addendum)
[facility] at [facility] Discharge Instructions  You were seen today by Dr. Ellin Saba. He went over your recent results. You will begin taking 1 mg of Vitamin B12 daily, and you will be scheduled for a CT of your neck and a CT of your abdomen prior to your next visit. Dr. Ellin Saba will see you back in 3 months for labs and follow up.   Thank you for choosing [facility] at [facility] to provide your oncology and hematology care.  To afford each patient quality time with our provider, please arrive at least 15 minutes before your scheduled appointment time.   If you have a lab appointment with the Cancer Center please come in thru the Main Entrance and check in at the main information desk  You need to re-schedule your appointment should you arrive 10 or more minutes late.  We strive to give you quality time with our providers, and arriving late affects you and other patients whose appointments are after yours.  Also, if you no show three or more times for appointments you may be dismissed from the clinic at the providers discretion.     Again, thank you for choosing [facility].  Our hope is that these requests will decrease the amount of time that you wait before being seen by our physicians.       _____________________________________________________________  Should you have questions after your visit to [facility], please contact our office at [phone] between the hours of 8:00 a.m. and 4:30 p.m.  Voicemails left after 4:00 p.m. will not be returned until the following business day.  For prescription refill requests, have your pharmacy contact our office and allow 72 hours.    Cancer Center Support Programs:   > Cancer Support Group  2nd Tuesday of the month 1pm-2pm, Journey Room

## 2020-07-16 ENCOUNTER — Encounter: Payer: Self-pay | Admitting: Nurse Practitioner

## 2020-07-16 ENCOUNTER — Ambulatory Visit: Payer: Medicaid - Out of State | Admitting: Gastroenterology

## 2020-07-28 ENCOUNTER — Encounter: Payer: Self-pay | Admitting: Nurse Practitioner

## 2020-07-29 ENCOUNTER — Ambulatory Visit: Payer: Self-pay | Admitting: Nurse Practitioner

## 2020-08-04 ENCOUNTER — Encounter: Payer: Self-pay | Admitting: Nurse Practitioner

## 2020-08-18 ENCOUNTER — Other Ambulatory Visit: Payer: Self-pay

## 2020-08-18 ENCOUNTER — Ambulatory Visit (INDEPENDENT_AMBULATORY_CARE_PROVIDER_SITE_OTHER): Payer: Medicaid - Out of State | Admitting: Nurse Practitioner

## 2020-08-18 ENCOUNTER — Encounter: Payer: Self-pay | Admitting: Nurse Practitioner

## 2020-08-18 VITALS — BP 92/63 | HR 94 | Ht 68.0 in | Wt 165.0 lb

## 2020-08-18 DIAGNOSIS — E782 Mixed hyperlipidemia: Secondary | ICD-10-CM | POA: Diagnosis not present

## 2020-08-18 DIAGNOSIS — E1165 Type 2 diabetes mellitus with hyperglycemia: Secondary | ICD-10-CM | POA: Diagnosis not present

## 2020-08-18 DIAGNOSIS — I10 Essential (primary) hypertension: Secondary | ICD-10-CM

## 2020-08-18 LAB — POCT GLYCOSYLATED HEMOGLOBIN (HGB A1C): Hemoglobin A1C: 12.5 % — AB (ref 4.0–5.6)

## 2020-08-18 LAB — POCT UA - MICROALBUMIN
Creatinine, POC: 200 mg/dL
Microalbumin Ur, POC: 80 mg/L

## 2020-08-18 MED ORDER — BLOOD GLUCOSE MONITOR KIT: PACK | 0 refills | Status: AC

## 2020-08-18 NOTE — Patient Instructions (Signed)

## 2020-08-18 NOTE — Progress Notes (Signed)
Endocrinology Follow Up Note       08/18/2020, 9:54 AM   Subjective:    Patient ID: Jose Koch, male    DOB: 1970/06/08.  Jose Koch is being seen in follow up after being seen in consultation for management of currently uncontrolled symptomatic diabetes requested by  Minna Antis, DO.   Past Medical History:  Diagnosis Date   COVID    Diabetes mellitus without complication (HCC)    High cholesterol    Hypertension    Ichthyosis vulgaris     Past Surgical History:  Procedure Laterality Date   TONSILLECTOMY     wisdom teeth removal      Social History   Socioeconomic History   Marital status: Divorced    Spouse name: Not on file   Number of children: Not on file   Years of education: Not on file   Highest education level: Not on file  Occupational History   Occupation: unemployed  Tobacco Use   Smoking status: Never   Smokeless tobacco: Never  Vaping Use   Vaping Use: Never used  Substance and Sexual Activity   Alcohol use: Not Currently   Drug use: Never   Sexual activity: Not on file  Other Topics Concern   Not on file  Social History Narrative   Right handed    Social Determinants of Health   Financial Resource Strain: Low Risk    Difficulty of Paying Living Expenses: Not hard at all  Food Insecurity: No Food Insecurity   Worried About Charity fundraiser in the Last Year: Never true   Arboriculturist in the Last Year: Never true  Transportation Needs: Unmet Transportation Needs   Lack of Transportation (Medical): Yes   Lack of Transportation (Non-Medical): Yes  Physical Activity: Sufficiently Active   Days of Exercise per Week: 7 days   Minutes of Exercise per Session: 30 min  Stress: Stress Concern Present   Feeling of Stress : Very much  Social Connections: Socially Isolated   Frequency of Communication with Friends and Family: More than three times a week   Frequency  of Social Gatherings with Friends and Family: More than three times a week   Attends Religious Services: Never   Marine scientist or Organizations: No   Attends Archivist Meetings: Never   Marital Status: Divorced    Family History  Family history unknown: Yes    Outpatient Encounter Medications as of 08/18/2020  Medication Sig   blood glucose meter kit and supplies KIT Dispense based on patient and insurance preference. Use up to four times daily as directed. (FOR ICD-9 250.00, 250.01).   Cyanocobalamin (VITAMIN B 12 PO) Take 1 mg by mouth.   glucose blood (ACCU-CHEK GUIDE) test strip Use as instructed to monitor glucose twice daily, before breakfast and before bed   insulin glargine (LANTUS) 100 UNIT/ML Solostar Pen Inject 30 Units into the skin daily.   Insulin Pen Needle 31G X 5 MM MISC 1 Units by Does not apply route daily.   metFORMIN (GLUCOPHAGE XR) 500 MG 24 hr tablet Take 1 tablet (500 mg total) by mouth daily with breakfast.  metoCLOPramide (REGLAN) 10 MG tablet Take 1 tablet (10 mg total) by mouth 3 (three) times daily with meals.   OneTouch Delica Lancets 03K MISC PLEASE SEE ATTACHED FOR DETAILED DIRECTIONS   pantoprazole (PROTONIX) 40 MG tablet Take 1 tablet (40 mg total) by mouth 2 (two) times daily before a meal. 30 minutes before breakfast and dinner   vitamin C (ASCORBIC ACID) 500 MG tablet Take 500 mg by mouth daily.   VITAMIN D PO Take 1 mg by mouth.   No facility-administered encounter medications on file as of 08/18/2020.    ALLERGIES: No Known Allergies  VACCINATION STATUS:  There is no immunization history on file for this patient.  Diabetes He presents for his follow-up diabetic visit. He has type 2 diabetes mellitus. Onset time: Was diagnosed at approx age of 38. His disease course has been worsening. There are no hypoglycemic associated symptoms. Associated symptoms include fatigue, polydipsia, polyphagia, polyuria and weight loss. There  are no hypoglycemic complications. Symptoms are stable. Diabetic complications include nephropathy and PVD. Risk factors for coronary artery disease include diabetes mellitus, dyslipidemia, hypertension, male sex and sedentary lifestyle. Current diabetic treatment includes insulin injections and oral agent (monotherapy). He is compliant with treatment most of the time. His weight is decreasing steadily. He is following a generally unhealthy diet. When asked about meal planning, he reported none. He has not had a previous visit with a dietitian. He rarely participates in exercise. (He presents today with his meter, no logs, showing inadequate monitoring pattern.  In the last 90 days he has only checked his glucose 6 times with average of 327.  He states he has had trouble affording the strips for his meter.  His POCT A1c today is 12.5%, worsening from previous visit of 12%.  He reports he has been taking his medication as prescribed.  He denies any s/s of hypoglycemia.) An ACE inhibitor/angiotensin II receptor blocker is not being taken. He does not see a podiatrist.Eye exam is not current.  Hypertension This is a chronic problem. The current episode started more than 1 year ago. The problem has been resolved since onset. The problem is controlled. There are no associated agents to hypertension. Risk factors for coronary artery disease include diabetes mellitus, dyslipidemia and male gender. Past treatments include nothing. Compliance problems include diet and exercise.  Hypertensive end-organ damage includes kidney disease and PVD. Identifiable causes of hypertension include chronic renal disease.  Hyperlipidemia This is a chronic problem. The current episode started more than 1 year ago. The problem is uncontrolled. Recent lipid tests were reviewed and are high. Exacerbating diseases include chronic renal disease and diabetes. Factors aggravating his hyperlipidemia include fatty foods. He is currently on no  antihyperlipidemic treatment. Compliance problems include adherence to diet and adherence to exercise.  Risk factors for coronary artery disease include diabetes mellitus, dyslipidemia, male sex and a sedentary lifestyle.    Review of systems  Constitutional: + steadily decreasing body weight (due to recent GI complications),  current Body mass index is 25.09 kg/m. , + fatigue, no subjective hyperthermia, no subjective hypothermia Eyes: + blurry vision, no xerophthalmia ENT: no sore throat, no nodules palpated in throat, no dysphagia/odynophagia, no hoarseness Cardiovascular: no chest pain, no shortness of breath, no palpitations Respiratory: no cough, no shortness of breath Gastrointestinal: + nausea/vomiting intermittent (Improving) Musculoskeletal: no muscle/joint aches Skin: no rashes, no hyperemia, chronic dry skin condition Neurological: no tremors, no numbness, no tingling, no dizziness Psychiatric: no depression, no anxiety  Objective:  BP 92/63   Pulse 94   Ht 5' 8"  (1.727 m)   Wt 165 lb (74.8 kg)   BMI 25.09 kg/m   Wt Readings from Last 3 Encounters:  08/18/20 165 lb (74.8 kg)  07/03/20 169 lb (76.7 kg)  06/13/20 169 lb (76.7 kg)     BP Readings from Last 3 Encounters:  08/18/20 92/63  07/03/20 112/69  06/13/20 96/63      Physical Exam- Limited  Constitutional:  Body mass index is 25.09 kg/m. , not in acute distress, normal state of mind Eyes:  EOMI, no exophthalmos Neck: Supple Cardiovascular: RRR, no murmurs, rubs, or gallops, no edema Respiratory: Adequate breathing efforts, no crackles, rales, rhonchi, or wheezing Musculoskeletal: no gross deformities, strength intact in all four extremities, no gross restriction of joint movements Skin:  no rashes, no hyperemia, diffuse dry flaky skin  Neurological: no tremor with outstretched hands    CMP ( most recent) CMP     Component Value Date/Time   NA 137 05/16/2020 0944   K 4.4 05/16/2020 0944   CL  102 05/16/2020 0944   CO2 24 05/16/2020 0944   GLUCOSE 358 (H) 05/16/2020 0944   BUN 32 (H) 05/16/2020 0944   CREATININE 1.56 (H) 05/16/2020 0944   CALCIUM 8.6 05/16/2020 0944   PROT 6.6 05/16/2020 0944   ALBUMIN 3.2 (L) 04/07/2020 0509   AST 15 05/16/2020 0944   ALT 23 05/16/2020 0944   ALKPHOS 72 04/07/2020 0509   BILITOT 0.5 05/16/2020 0944   GFRNONAA 52 (L) 05/16/2020 0944   GFRAA 60 05/16/2020 0944     Diabetic Labs (most recent): Lab Results  Component Value Date   HGBA1C 12.5 (A) 08/18/2020   HGBA1C 12.0 (H) 04/06/2020     Lipid Panel ( most recent) Lipid Panel     Component Value Date/Time   CHOL 492 (H) 04/14/2020 1551   TRIG 747 (H) 04/14/2020 1551   HDL 34 (L) 04/14/2020 1551   CHOLHDL 14.5 04/14/2020 1551   VLDL UNABLE TO CALCULATE IF TRIGLYCERIDE OVER 400 mg/dL 04/14/2020 1551   LDLCALC UNABLE TO CALCULATE IF TRIGLYCERIDE OVER 400 mg/dL 04/14/2020 1551   LDLDIRECT 106.3 (H) 04/14/2020 1551      Lab Results  Component Value Date   TSH 4.014 04/14/2020   FREET4 0.82 04/14/2020           Assessment & Plan:   1) Hyperglycemia due to diabetes mellitus (HCC)  - Jose Koch has currently uncontrolled symptomatic type 2 DM since 51 years of age.  He presents today with his meter, no logs, showing inadequate monitoring pattern.  In the last 90 days he has only checked his glucose 6 times with average of 327.  He states he has had trouble affording the strips for his meter.  His POCT A1c today is 12.5%, worsening from previous visit of 12%.  He reports he has been taking his medication as prescribed.  He denies any s/s of hypoglycemia.  -Recent labs reviewed.  His POCT UM shows mild microalbuminuria of 80.  - I had a long discussion with him about the progressive nature of diabetes and the pathology behind its complications. -his diabetes is not currently complicated but he remains at a high risk for more acute and chronic complications which include  CAD, CVA, CKD, retinopathy, and neuropathy. These are all discussed in detail with him.  - Nutritional counseling repeated at each appointment due to patients tendency to fall back in to old habits.  -  The patient admits there is a room for improvement in their diet and drink choices. -  Suggestion is made for the patient to avoid simple carbohydrates from their diet including Cakes, Sweet Desserts / Pastries, Ice Cream, Soda (diet and regular), Sweet Tea, Candies, Chips, Cookies, Sweet Pastries, Store Bought Juices, Alcohol in Excess of 1-2 drinks a day, Artificial Sweeteners, Coffee Creamer, and "Sugar-free" Products. This will help patient to have stable blood glucose profile and potentially avoid unintended weight gain.   - I encouraged the patient to switch to unprocessed or minimally processed complex starch and increased protein intake (animal or plant source), fruits, and vegetables.   - Patient is advised to stick to a routine mealtimes to eat 3 meals a day and avoid unnecessary snacks (to snack only to correct hypoglycemia).  - he will be scheduled with Jearld Fenton, RDN, CDE for diabetes education.  - I have approached him with the following individualized plan to manage  his diabetes and patient agrees:   -Given his lack of readings to review, no changes will be made to his medication regimen.  He is advised to continue Lantus 30 units SQ nightly and Metformin 500 mg ER daily with breakfast.    -He is encouraged to start consistently monitoring blood glucose at least twice daily, before breakfast and before bed, and to call the clinic if he has readings less than 70 or greater than 300 for 3 tests in a row.  He was given a sample [employer] today  (although it wont be covered by insurance) so that we can obtain some readings for next weeks follow up appointment to gain better control of his diabetes.  - he is warned not to take insulin without proper monitoring per orders. -  Adjustment parameters are given to him for hypo and hyperglycemia in writing.  - he is not a good candidate for incretin therapy due to significantly elevated triglycerides and body habitus.  - Specific targets for  A1c;  LDL, HDL,  and Triglycerides were discussed with the patient.  2) Blood Pressure /Hypertension:  his blood pressure is controlled to target without the use of antihypertensive medications.    3) Lipids/Hyperlipidemia:    Review of his recent lipid panel from 06/26/19 showed severely high triglycerides at 2018, LDL unable to be calculated. He is not currently on any lipid lowering medications at this time, however he had previously been on a statin.   4)  Weight/Diet:  his Body mass index is 25.09 kg/m.  -  he is not a candidate for weight loss.  Exercise, and detailed carbohydrates information provided  -  detailed on discharge instructions.  5) Chronic Care/Health Maintenance: -he is not on ACEI/ARB or Statin medications and is encouraged to initiate and continue to follow up with Ophthalmology, Dentist, Podiatrist at least yearly or according to recommendations, and advised to stay away from smoking. I have recommended yearly flu vaccine and pneumonia vaccine at least every 5 years; moderate intensity exercise for up to 150 minutes weekly; and sleep for at least 7 hours a day.  - he is advised to maintain close follow up with Minna Antis, DO for primary care needs, as well as his other providers for optimal and coordinated care.      I spent 45 minutes in the care of the patient today including review of labs from [facility], Lipids, Thyroid Function, Hematology (current and previous including abstractions from other facilities); face-to-face time discussing  his blood  glucose readings/logs, discussing hypoglycemia and hyperglycemia episodes and symptoms, medications doses, his options of short and long term treatment based on the latest standards of care / guidelines;   discussion about incorporating lifestyle medicine;  and documenting the encounter.    Please refer to Patient Instructions for Blood Glucose Monitoring and Insulin/Medications Dosing Guide"  in media tab for additional information. Please  also refer to " Patient Self Inventory" in the Media  tab for reviewed elements of pertinent patient history.  Jose Koch participated in the discussions, expressed understanding, and voiced agreement with the above plans.  All questions were answered to his satisfaction. he is encouraged to contact clinic should he have any questions or concerns prior to his return visit.   Follow up plan: - Return in about 1 week (around 08/25/2020) for Diabetes F/U, Bring meter and logs.  Rayetta Pigg, [facility] [address] [place], [place] [address] Phone: [phone] Fax: [phone]  08/18/2020, 9:54 AM

## 2020-08-26 ENCOUNTER — Ambulatory Visit: Payer: Medicaid - Out of State | Admitting: Nurse Practitioner

## 2020-09-19 ENCOUNTER — Telehealth: Payer: Self-pay | Admitting: Neurology

## 2020-09-19 DIAGNOSIS — I618 Other nontraumatic intracerebral hemorrhage: Secondary | ICD-10-CM

## 2020-09-19 NOTE — Telephone Encounter (Signed)
[name] called in and would like Jose Koch's MRI to be done at another location. Please call to confirm [phone]

## 2020-09-19 NOTE — Telephone Encounter (Signed)
Telephone call to pt significant other [name], Please change location of MRI/MRA head [facility] not in network  with the pt insurance. Please send to [facility]  Pt is self pay out of state insurance.  Will call [facility] 10/02/20 at 3 pm. Pt to arrive at 2;30 pm   LMOVM with appt time and date

## 2020-09-30 NOTE — Progress Notes (Signed)
PA Form filled out, Clinicals attached  and faxed to the urgent number for approval. [phone].

## 2020-10-01 ENCOUNTER — Inpatient Hospital Stay (HOSPITAL_COMMUNITY): Payer: PRIVATE HEALTH INSURANCE

## 2020-10-01 ENCOUNTER — Ambulatory Visit (HOSPITAL_COMMUNITY): Payer: Medicaid - Out of State

## 2020-10-02 ENCOUNTER — Ambulatory Visit (HOSPITAL_COMMUNITY): Payer: PRIVATE HEALTH INSURANCE

## 2020-10-07 ENCOUNTER — Ambulatory Visit (HOSPITAL_COMMUNITY): Payer: Medicaid - Out of State | Admitting: Hematology

## 2020-10-08 ENCOUNTER — Other Ambulatory Visit (HOSPITAL_COMMUNITY): Payer: Self-pay | Admitting: *Deleted

## 2020-10-14 NOTE — Progress Notes (Signed)
Per [employer] PA approved for MRA/MRI.   Faxing over the approval now. Will have front desk scan it in.    Significant other [name] to call [facility] to have the appt rescheduled.

## 2020-10-15 ENCOUNTER — Other Ambulatory Visit: Payer: Self-pay | Admitting: Gastroenterology

## 2020-10-24 ENCOUNTER — Other Ambulatory Visit: Payer: Self-pay

## 2020-10-24 ENCOUNTER — Ambulatory Visit (HOSPITAL_COMMUNITY)
Admission: RE | Admit: 2020-10-24 | Discharge: 2020-10-24 | Disposition: A | Discharge status: Home / Self Care | Payer: Medicaid - Out of State | Source: Ambulatory Visit | Attending: Neurology | Admitting: Neurology

## 2020-10-24 ENCOUNTER — Inpatient Hospital Stay (HOSPITAL_COMMUNITY): Payer: PRIVATE HEALTH INSURANCE | Attending: Hematology and Oncology

## 2020-10-24 DIAGNOSIS — D696 Thrombocytopenia, unspecified: Secondary | ICD-10-CM

## 2020-10-24 DIAGNOSIS — I618 Other nontraumatic intracerebral hemorrhage: Secondary | ICD-10-CM | POA: Diagnosis present

## 2020-10-24 DIAGNOSIS — R161 Splenomegaly, not elsewhere classified: Secondary | ICD-10-CM

## 2020-10-24 LAB — CBC WITH DIFFERENTIAL/PLATELET
Abs Immature Granulocytes: 0.04 10*3/uL (ref 0.00–0.07)
Basophils Absolute: 0 10*3/uL (ref 0.0–0.1)
Basophils Relative: 1 %
Eosinophils Absolute: 0.1 10*3/uL (ref 0.0–0.5)
Eosinophils Relative: 1 %
HCT: 34.7 % — ABNORMAL LOW (ref 39.0–52.0)
Hemoglobin: 11.6 g/dL — ABNORMAL LOW (ref 13.0–17.0)
Immature Granulocytes: 1 %
Lymphocytes Relative: 23 %
Lymphs Abs: 1.2 10*3/uL (ref 0.7–4.0)
MCH: 28 pg (ref 26.0–34.0)
MCHC: 33.4 g/dL (ref 30.0–36.0)
MCV: 83.6 fL (ref 80.0–100.0)
Monocytes Absolute: 0.3 10*3/uL (ref 0.1–1.0)
Monocytes Relative: 6 %
Neutro Abs: 3.5 10*3/uL (ref 1.7–7.7)
Neutrophils Relative %: 68 %
Platelets: 95 10*3/uL — ABNORMAL LOW (ref 150–400)
RBC: 4.15 MIL/uL — ABNORMAL LOW (ref 4.22–5.81)
RDW: 13.8 % (ref 11.5–15.5)
WBC: 5.1 10*3/uL (ref 4.0–10.5)
nRBC: 0 % (ref 0.0–0.2)

## 2020-10-24 LAB — LACTATE DEHYDROGENASE: LDH: 121 U/L (ref 98–192)

## 2020-10-24 IMAGING — MR MR HEAD W/O CM
11 of 12 series · 35 of 48 positions shown · non-contrast
Comparison: Head CT [DATE]

CLINICAL DATA: Other right side nontraumatic intracerebral
hemorrhage. Right facial numbness. Head injury in [DATE] with loss
of consciousness.

EXAM:
MRI HEAD WITHOUT CONTRAST
MRA HEAD WITHOUT CONTRAST
TECHNIQUE: Multiplanar, multi-echo pulse sequences of the brain and surrounding
structures were acquired without intravenous contrast. Angiographic
images of the Circle of Willis were acquired using MRA technique
without intravenous contrast.

[Series 5: DWI · axial · 3.0mm · 0.77mm/px · z∈[-71,+68]mm · 3 of 50 slices shown (1 of 4)]
[im 1/50]
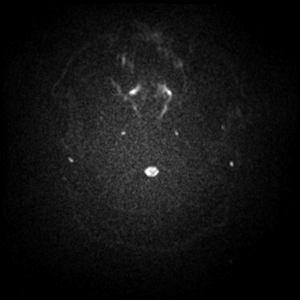
[im 25/50]
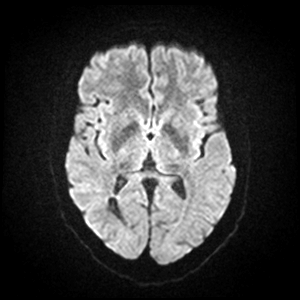
[im 50/50]
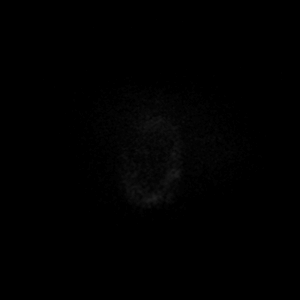

[Series 6: DWI · axial · 3.0mm · 0.77mm/px · z∈[-71,+68]mm · 3 of 50 slices shown (2 of 4)]
[im 1/50]
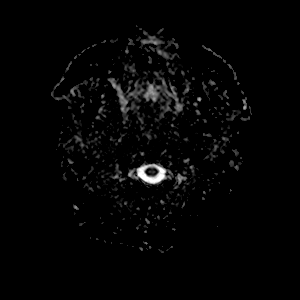
[im 25/50]
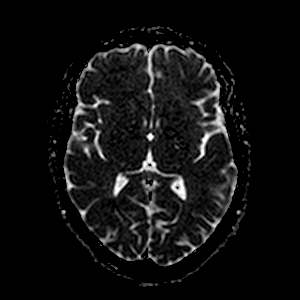
[im 50/50]
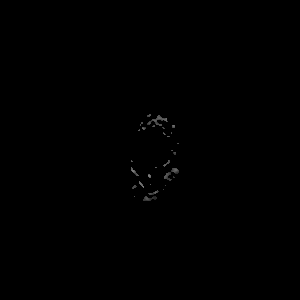

[Series 7: DWI · coronal · 5.0mm · 0.88mm/px · 3 of 32 slices shown (3 of 4)]
[im 1/32]
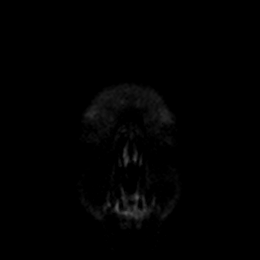
[im 16/32]
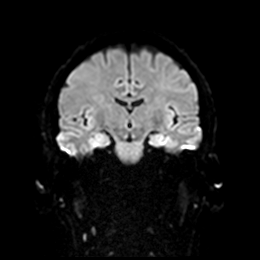
[im 32/32]
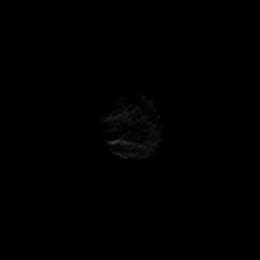

[Series 8: DWI · coronal · 5.0mm · 0.88mm/px · 3 of 32 slices shown (4 of 4)]
[im 1/32]
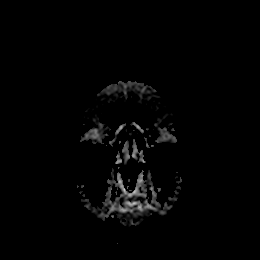
[im 16/32]
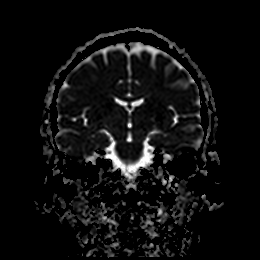
[im 32/32]
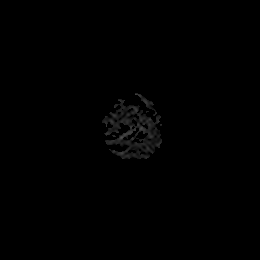

[Series 9: T1 · sagittal · 5.0mm · 0.75mm/px · 2 of 21 slices shown]
[im 1/21]
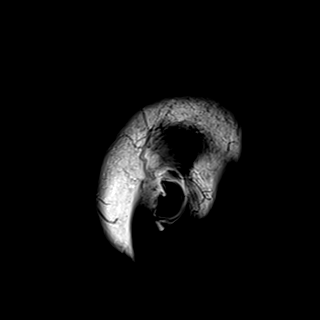
[im 21/21]
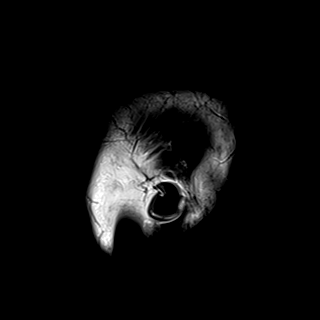

[Series 10: T2 · axial · 5.0mm · 0.72mm/px · z∈[-71,+68]mm · 2 of 22 slices shown (1 of 2)]
[im 1/22]
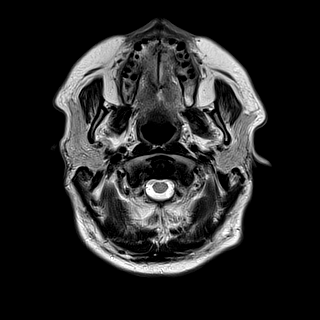
[im 22/22]
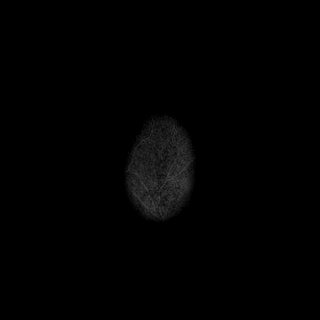

[Series 11: mag_images · axial · 3.0mm · 0.90mm/px · z∈[-74,+71]mm · 4 of 52 slices shown]
[im 1/52]
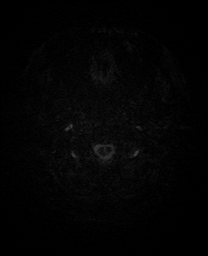
[im 18/52]
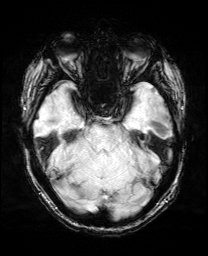
[im 35/52]
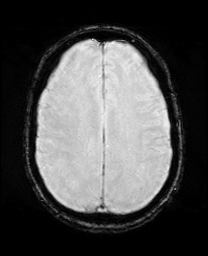
[im 52/52]
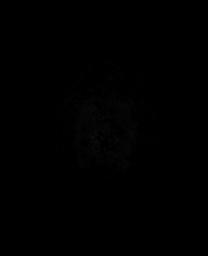

[Series 12: pha_images · axial · 3.0mm · 0.90mm/px · z∈[-74,+71]mm · 4 of 51 slices shown]
[im 1/51]
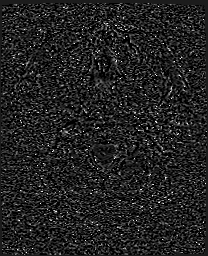
[im 17/51]
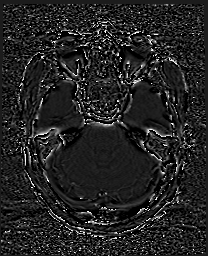
[im 34/51]
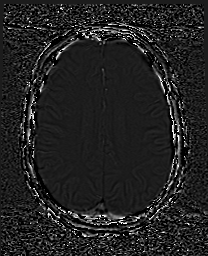
[im 51/51]
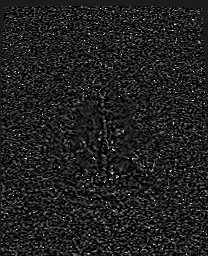

[Series 13: swi_images · axial · 3.0mm · 0.90mm/px · z∈[-74,+71]mm · 4 of 52 slices shown]
[im 1/52]
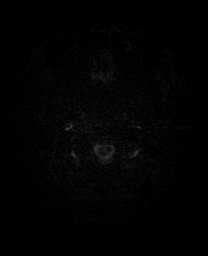
[im 18/52]
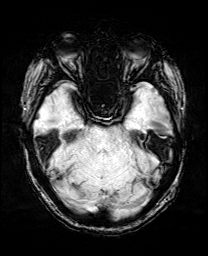
[im 35/52]
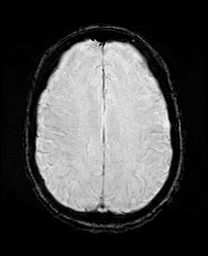
[im 52/52]
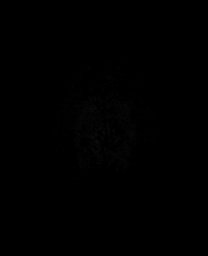

[Series 15: FLAIR · axial · 3.0mm · 0.45mm/px · z∈[-71,+68]mm · 4 of 50 slices shown]
[im 1/50]
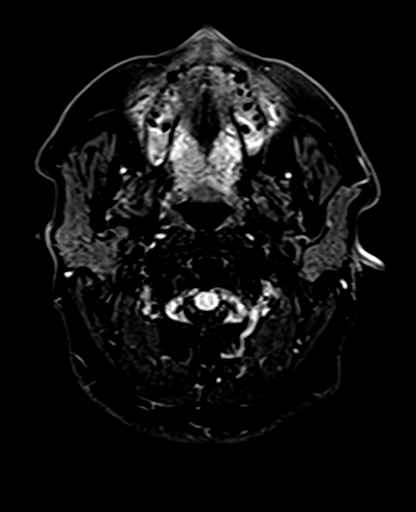
[im 17/50]
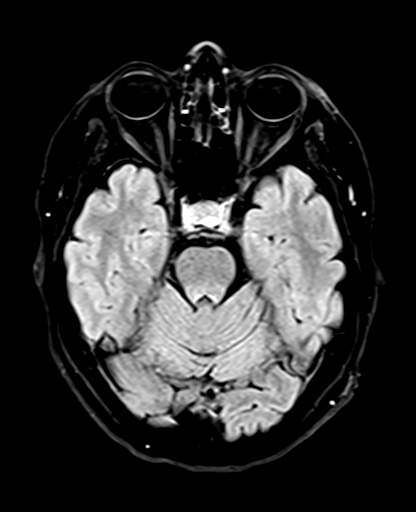
[im 33/50]
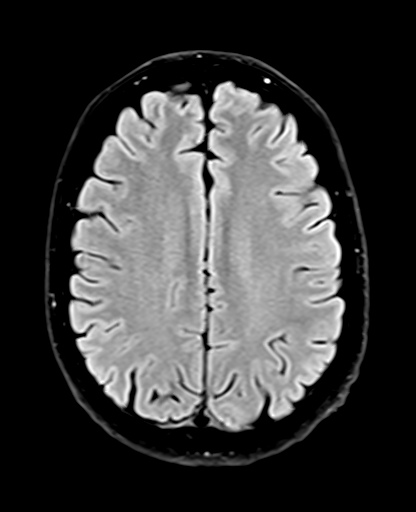
[im 50/50]
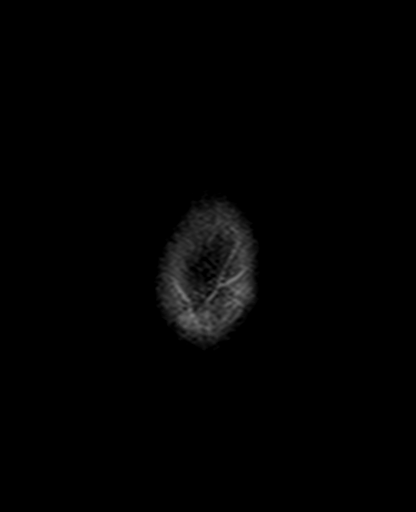

[Series 17: T2 · coronal · 5.0mm · 0.72mm/px · 3 of 32 slices shown (2 of 2)]
[im 1/32]
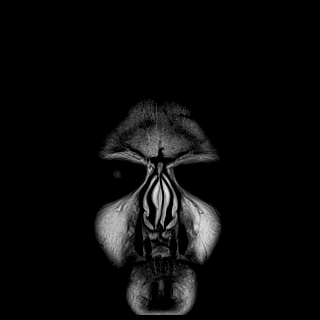
[im 16/32]
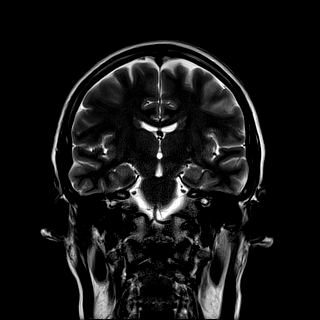
[im 32/32]
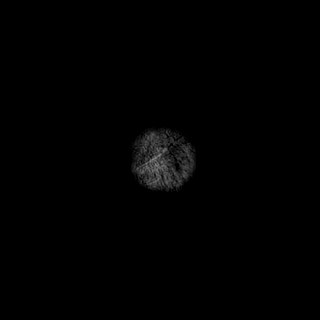

[35 of 48 positions shown; findings below may reference images not displayed]

FINDINGS: MRI HEAD FINDINGS

Brain: There is no evidence of an acute infarct, midline shift, or
extra-axial fluid collection. The ventricles and sulci are normal.
There is a 7 mm lesion in the right periatrial white matter which
demonstrates central heterogeneous T2 hyperintensity, a T2
hypointense rim, mixed T1 signal intensity, and prominent
susceptibility artifact consistent with a cavernoma and
corresponding to the hyperdense focus on CT. Adjacent curvilinear
susceptibility artifact could reflect an associated developmental
venous anomaly. There is a separate 3 mm focus of chronic hemorrhage
and mild cortical gliosis in the right occipital lobe.

Vascular: Major intracranial vascular flow voids are preserved.

Skull and upper cervical spine: Unremarkable bone marrow signal.

Sinuses/Orbits: Unremarkable orbits. Mild mucosal thickening in the
paranasal sinuses. Clear mastoid air cells.

Other: None.

MRA HEAD FINDINGS

Anterior circulation: The internal carotid arteries are patent from
skull base to carotid termini with a normal appearance on the left.
There is an irregular, lobulated contour of the of the right
cavernous ICA with mild narrowing. There may be some artifact in
this region based on source images, however the cavernous ICAs are
also asymmetric in appearance on the standard brain MRI sequences
favoring a true underlying abnormality. ACAs and MCAs are patent
without evidence of a proximal branch occlusion or significant
proximal stenosis.

Posterior circulation: The intracranial vertebral arteries are
widely patent to the basilar and codominant. The right PICA and left
AICA appear dominant. Patent SCA is are seen bilaterally. The
basilar artery is widely patent. There are left larger than right
posterior communicating arteries with absence of the left P1
segment. Both PCAs are patent without evidence of a significant
proximal stenosis. No aneurysm is identified.

Anatomic variants: Fetal left PCA.
IMPRESSION: 1. Small cavernoma in the right periatrial white matter accounting
for the density on CT.
2. Small focus of chronic hemorrhage and cortical gliosis in the
right occipital lobe, possibly posttraumatic.
3. Irregular narrowing of the right cavernous ICA. This is of
uncertain etiology given the lack of evidence of significant
intracranial atherosclerosis elsewhere, and a head CTA is
recommended for further evaluation.

## 2020-10-24 IMAGING — MR MR MRA HEAD W/O CM
1 series · 18 of 48 positions shown · non-contrast
Comparison: Head CT [DATE]

CLINICAL DATA: Other right side nontraumatic intracerebral
hemorrhage. Right facial numbness. Head injury in [DATE] with loss
of consciousness.

EXAM:
MRI HEAD WITHOUT CONTRAST
MRA HEAD WITHOUT CONTRAST
TECHNIQUE: Multiplanar, multi-echo pulse sequences of the brain and surrounding
structures were acquired without intravenous contrast. Angiographic
images of the Circle of Willis were acquired using MRA technique
without intravenous contrast.

[Series 1: TOF · axial · 0.4mm · 0.43mm/px · z∈[-75,+18]mm · 18 of 254 slices shown]
[im 1/254]
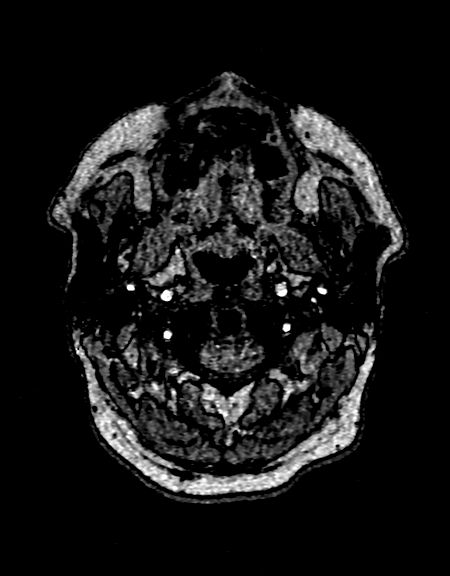
[im 6/254]
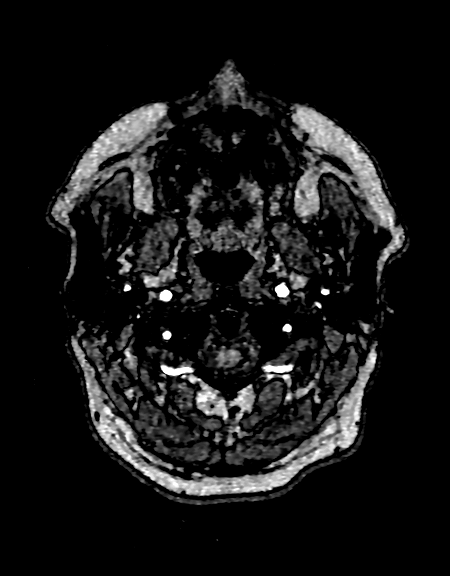
[im 11/254]
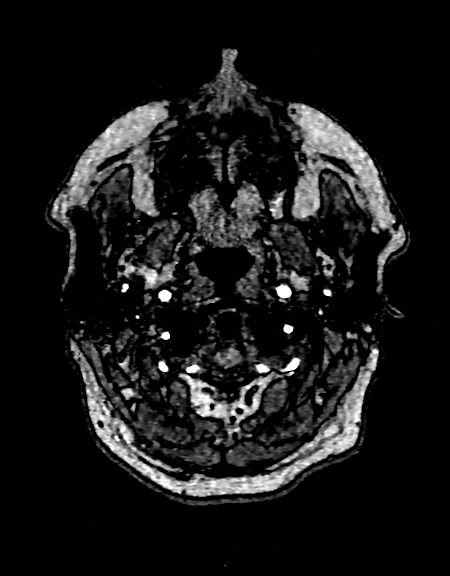
[im 17/254]
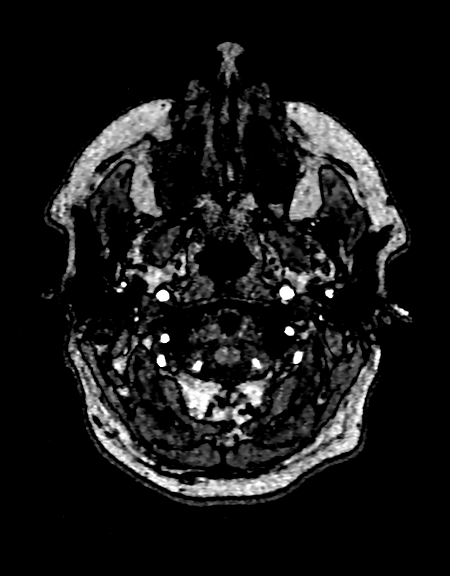
[im 22/254]
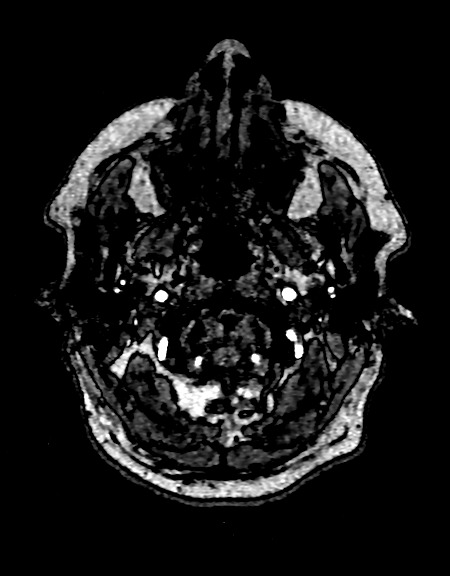
[im 27/254]
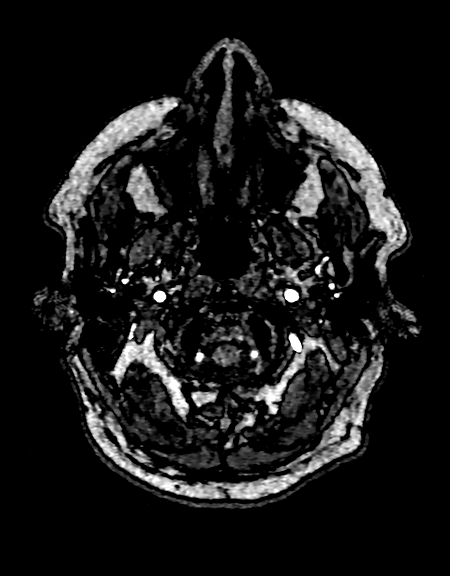
[im 33/254]
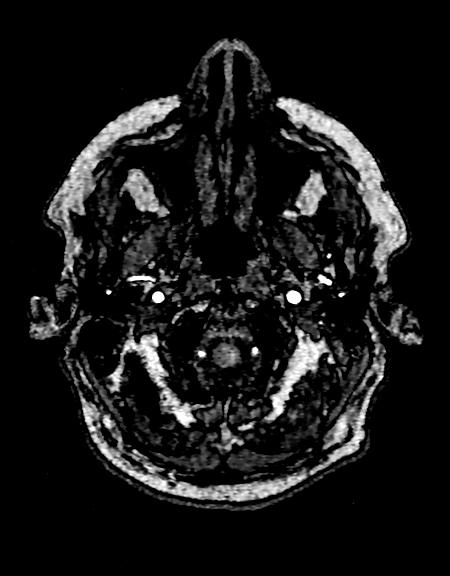
[im 38/254]
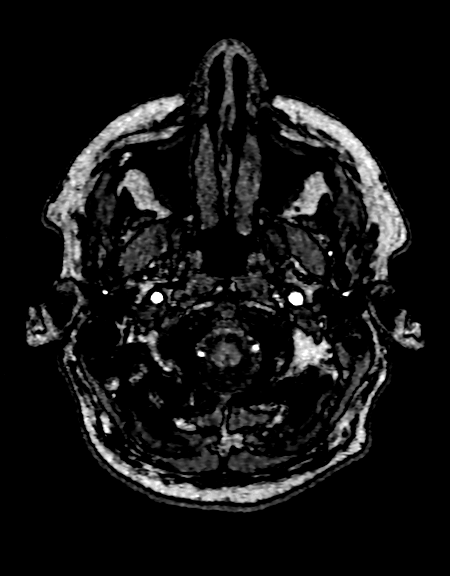
[im 44/254]
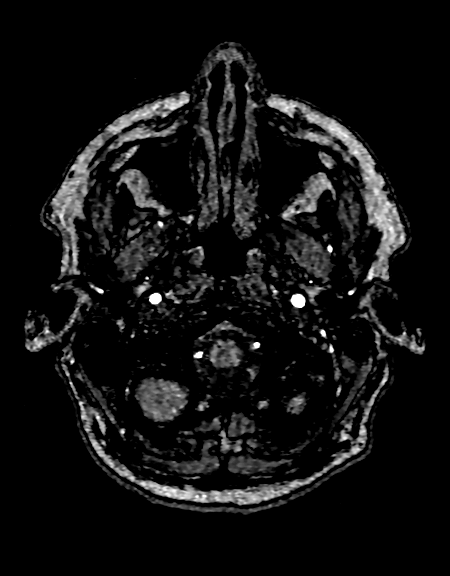
[im 49/254]
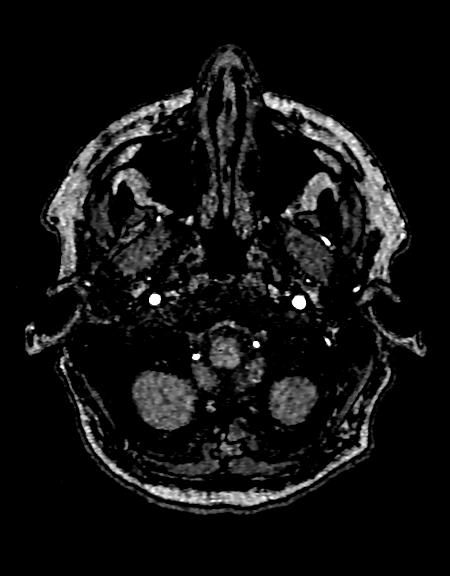
[im 81/254]
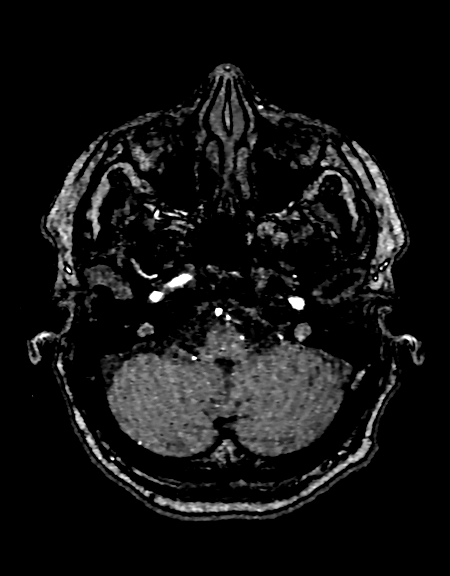
[im 114/254]
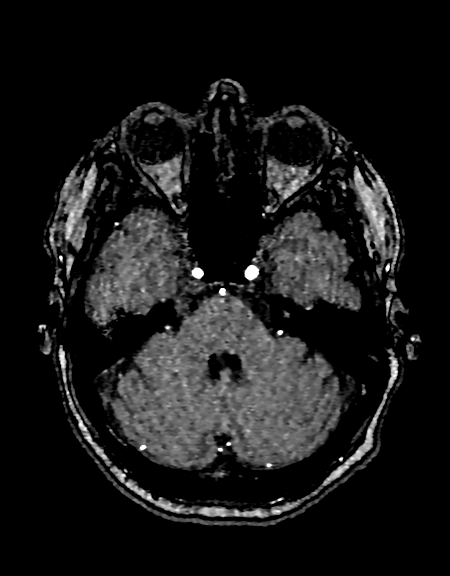
[im 130/254]
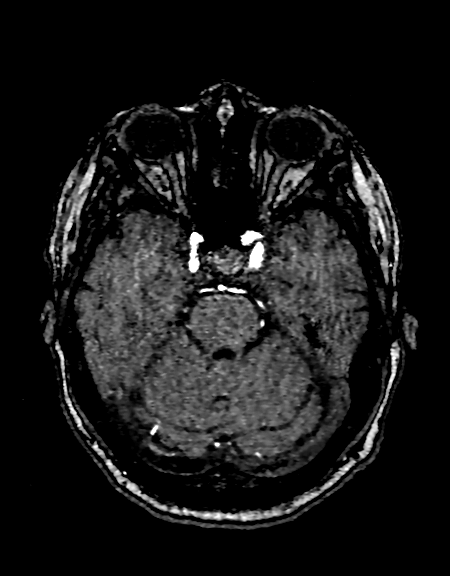
[im 146/254]
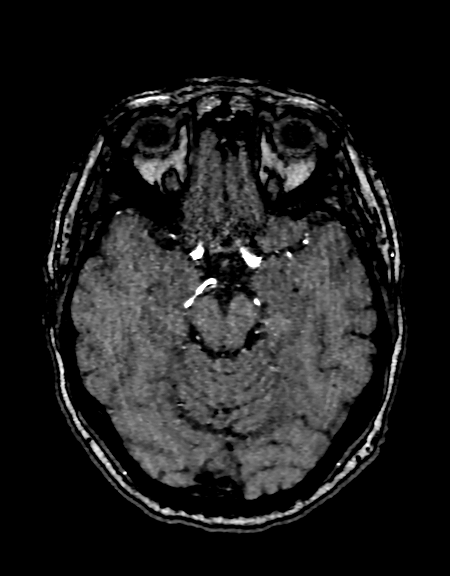
[im 178/254]
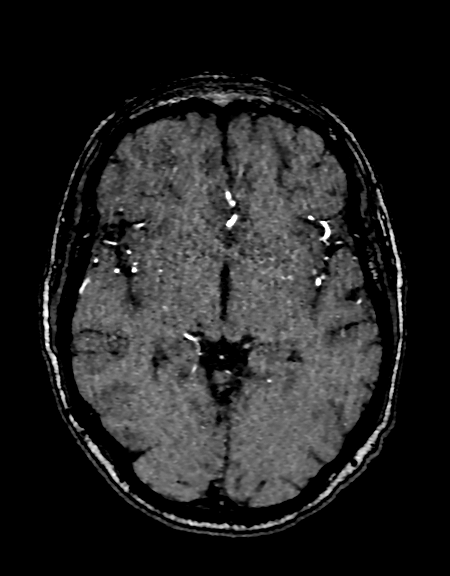
[im 210/254]
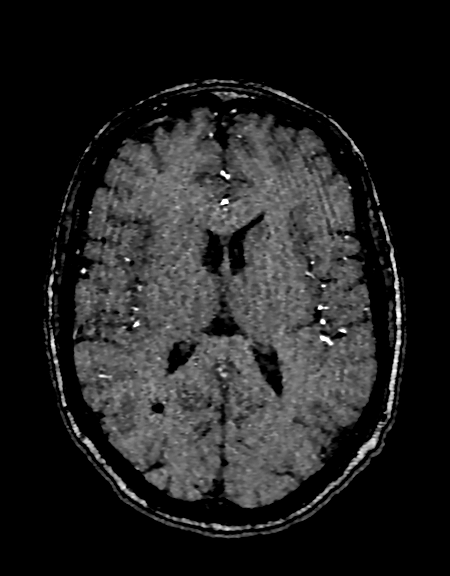
[im 216/254]
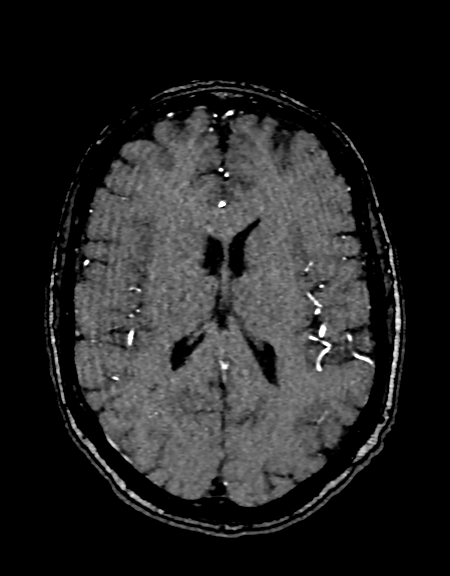
[im 243/254]
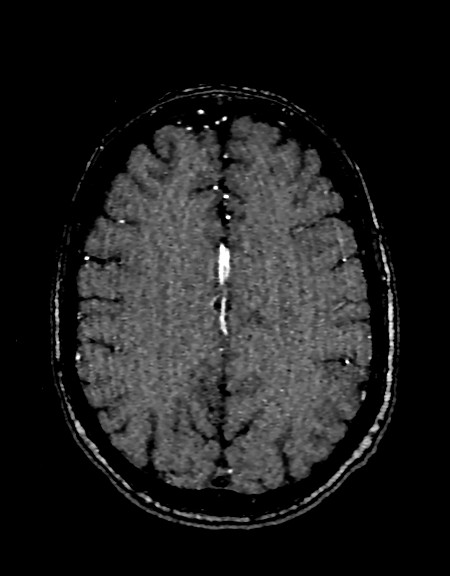

[18 of 48 positions shown; findings below may reference images not displayed]

FINDINGS: MRI HEAD FINDINGS

Brain: There is no evidence of an acute infarct, midline shift, or
extra-axial fluid collection. The ventricles and sulci are normal.
There is a 7 mm lesion in the right periatrial white matter which
demonstrates central heterogeneous T2 hyperintensity, a T2
hypointense rim, mixed T1 signal intensity, and prominent
susceptibility artifact consistent with a cavernoma and
corresponding to the hyperdense focus on CT. Adjacent curvilinear
susceptibility artifact could reflect an associated developmental
venous anomaly. There is a separate 3 mm focus of chronic hemorrhage
and mild cortical gliosis in the right occipital lobe.

Vascular: Major intracranial vascular flow voids are preserved.

Skull and upper cervical spine: Unremarkable bone marrow signal.

Sinuses/Orbits: Unremarkable orbits. Mild mucosal thickening in the
paranasal sinuses. Clear mastoid air cells.

Other: None.

MRA HEAD FINDINGS

Anterior circulation: The internal carotid arteries are patent from
skull base to carotid termini with a normal appearance on the left.
There is an irregular, lobulated contour of the of the right
cavernous ICA with mild narrowing. There may be some artifact in
this region based on source images, however the cavernous ICAs are
also asymmetric in appearance on the standard brain MRI sequences
favoring a true underlying abnormality. ACAs and MCAs are patent
without evidence of a proximal branch occlusion or significant
proximal stenosis.

Posterior circulation: The intracranial vertebral arteries are
widely patent to the basilar and codominant. The right PICA and left
AICA appear dominant. Patent SCA is are seen bilaterally. The
basilar artery is widely patent. There are left larger than right
posterior communicating arteries with absence of the left P1
segment. Both PCAs are patent without evidence of a significant
proximal stenosis. No aneurysm is identified.

Anatomic variants: Fetal left PCA.
IMPRESSION: 1. Small cavernoma in the right periatrial white matter accounting
for the density on CT.
2. Small focus of chronic hemorrhage and cortical gliosis in the
right occipital lobe, possibly posttraumatic.
3. Irregular narrowing of the right cavernous ICA. This is of
uncertain etiology given the lack of evidence of significant
intracranial atherosclerosis elsewhere, and a head CTA is
recommended for further evaluation.

## 2020-10-29 LAB — METHYLMALONIC ACID, SERUM: Methylmalonic Acid, Quantitative: 356 nmol/L (ref 0–378)

## 2020-10-31 ENCOUNTER — Telehealth: Payer: Self-pay | Admitting: Neurology

## 2020-10-31 ENCOUNTER — Telehealth: Payer: Self-pay

## 2020-10-31 DIAGNOSIS — R9089 Other abnormal findings on diagnostic imaging of central nervous system: Secondary | ICD-10-CM

## 2020-10-31 NOTE — Telephone Encounter (Signed)
See other note. 10/31/20

## 2020-10-31 NOTE — Telephone Encounter (Signed)
Pt and Girlfriend advised of pt MRI Brain.

## 2020-10-31 NOTE — Telephone Encounter (Signed)
Pt gf called in regarding the missed call from [facility].

## 2020-10-31 NOTE — Telephone Encounter (Signed)
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  There is also evidence of a tiny old bleed, likely related to the fall.  One of his blood vessels looks a little irregular.  This may be of no clinical significance but I would like to follow up by getting a CTA of head which better looks at the blood vessels.

## 2020-11-06 ENCOUNTER — Other Ambulatory Visit (HOSPITAL_COMMUNITY): Payer: PRIVATE HEALTH INSURANCE

## 2020-11-07 ENCOUNTER — Ambulatory Visit (HOSPITAL_COMMUNITY): Payer: PRIVATE HEALTH INSURANCE

## 2020-11-13 ENCOUNTER — Ambulatory Visit (HOSPITAL_COMMUNITY): Payer: PRIVATE HEALTH INSURANCE | Admitting: Hematology

## 2020-12-02 ENCOUNTER — Encounter (HOSPITAL_COMMUNITY): Payer: Self-pay

## 2020-12-02 ENCOUNTER — Other Ambulatory Visit: Payer: Self-pay

## 2020-12-02 ENCOUNTER — Ambulatory Visit (HOSPITAL_COMMUNITY)
Admission: RE | Admit: 2020-12-02 | Discharge: 2020-12-02 | Disposition: A | Discharge status: Home / Self Care | Payer: Medicaid - Out of State | Source: Ambulatory Visit | Attending: Hematology | Admitting: Hematology

## 2020-12-02 DIAGNOSIS — D696 Thrombocytopenia, unspecified: Secondary | ICD-10-CM | POA: Diagnosis present

## 2020-12-02 DIAGNOSIS — R161 Splenomegaly, not elsewhere classified: Secondary | ICD-10-CM

## 2020-12-02 IMAGING — CT CT NECK W/ CM
3 of 4 series · 13 of 33 positions shown, 16 images · IV contrast (Omnipaque or Isovue)
Comparison: PET-CT [DATE].

CLINICAL DATA: Lymphadenopathy, neck follow up on lymp nodes neck

EXAM:
CT NECK WITH CONTRAST
TECHNIQUE: Multidetector CT imaging of the neck was performed using the
standard protocol following the bolus administration of intravenous
contrast.
CONTRAST:  100mL OMNIPAQUE IOHEXOL 300 MG/ML  SOLN

[Series 3: axial neck · axial · 0.53mm/px · z∈[-138,+36]mm · 5 of 131 slices shown, 7 images]
[im 22/131  soft-tissue]
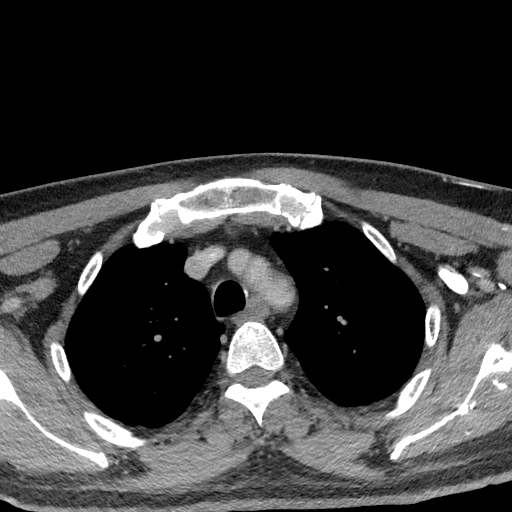
[im 22/131  bone]
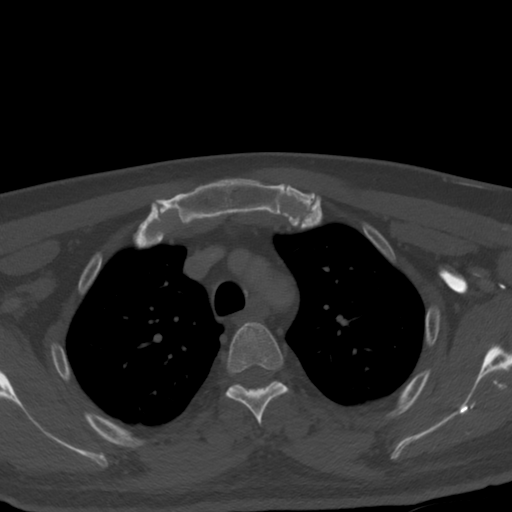
[im 44/131  bone]
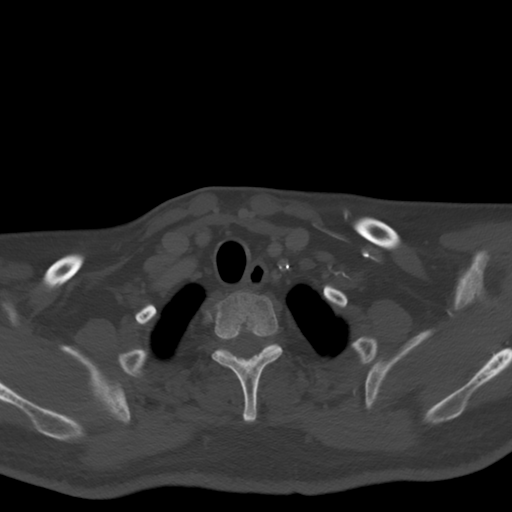
[im 66/131  bone]
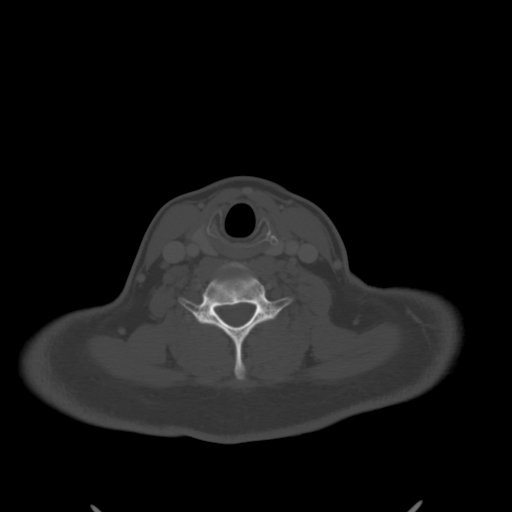
[im 87/131  bone]
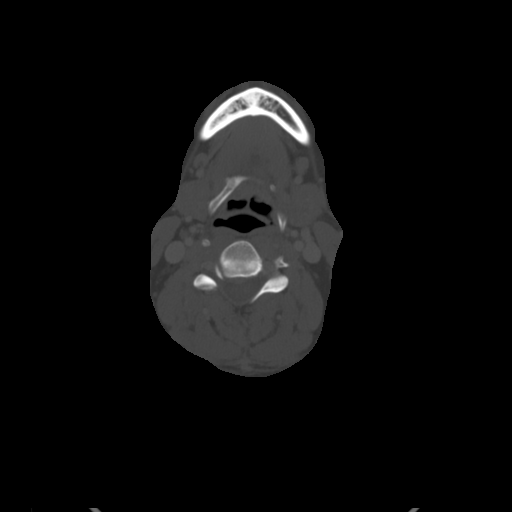
[im 109/131  soft-tissue]
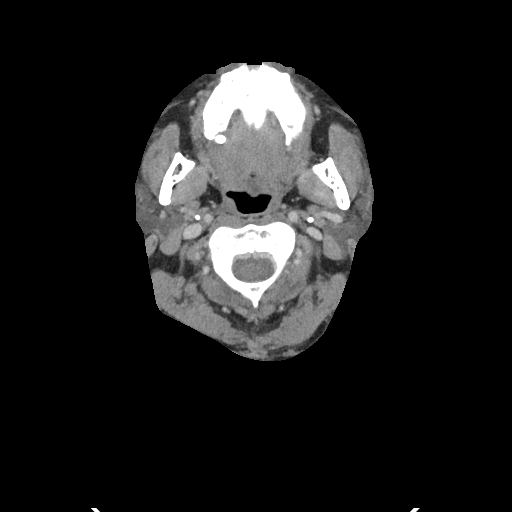
[im 109/131  bone]
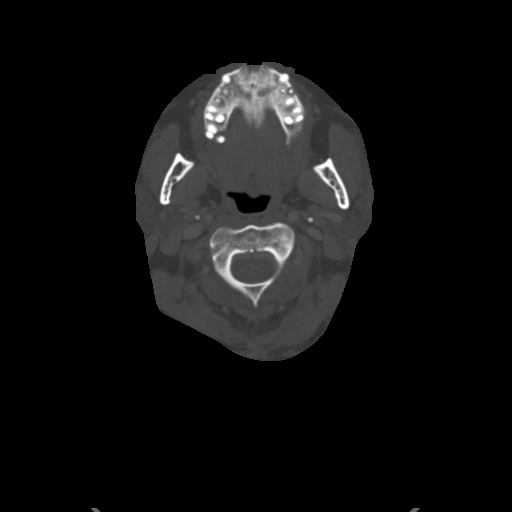

[Series 5: cor neck · coronal · 0.35mm/px · 3 of 127 slices shown]
[im 26/127  bone]
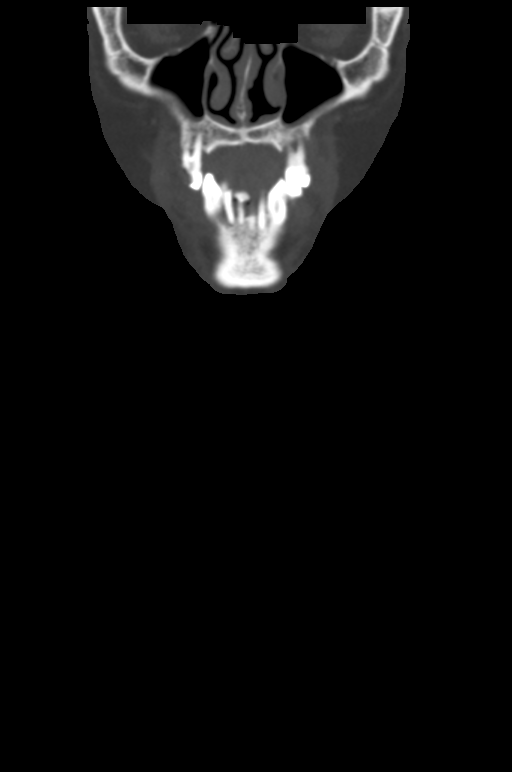
[im 51/127  bone]
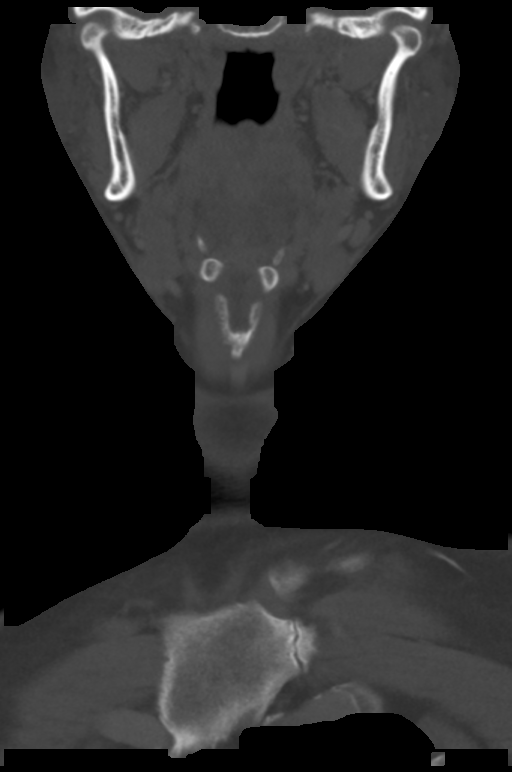
[im 76/127  bone]
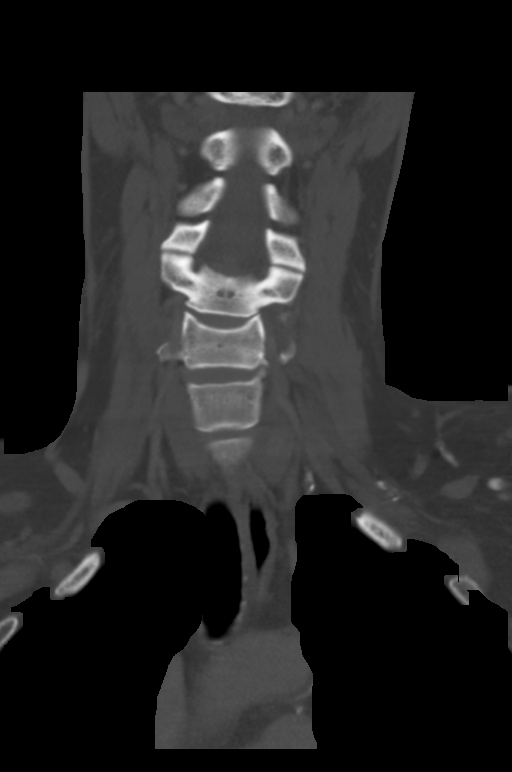

[Series 6: sag neck · sagittal · 0.52mm/px · 5 of 80 slices shown, 6 images]
[im 27/80  bone]
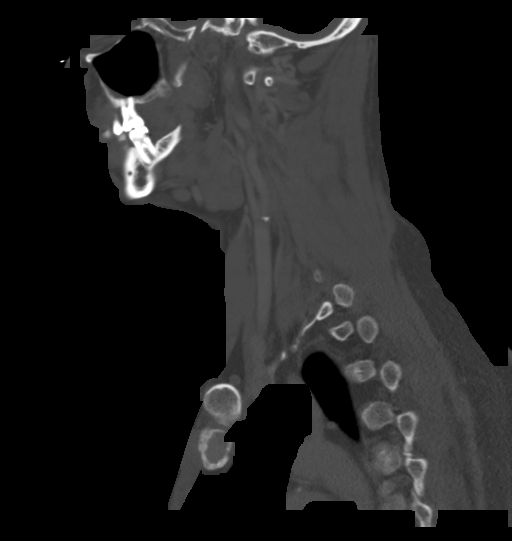
[im 33/80  bone]
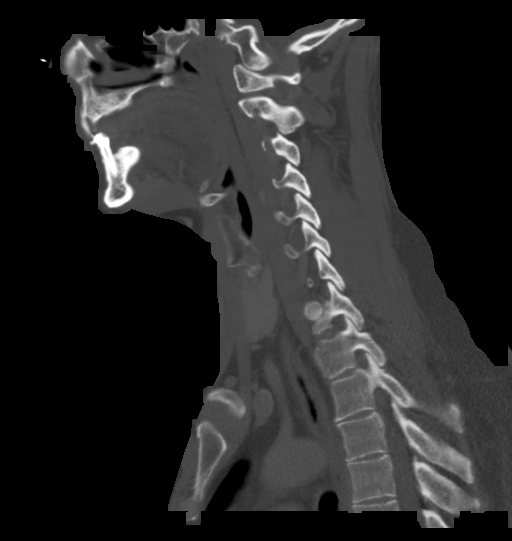
[im 40/80  soft-tissue]
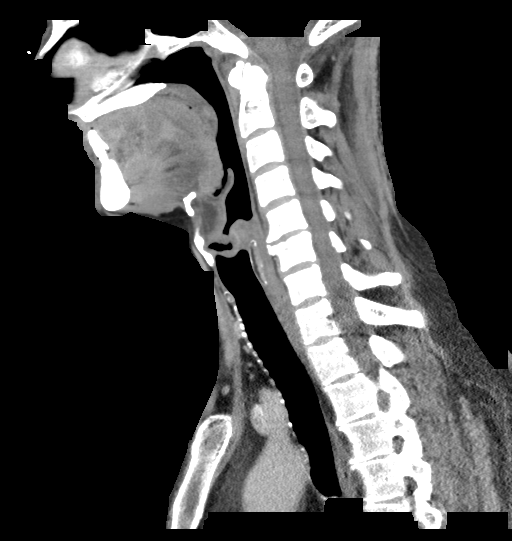
[im 40/80  bone]
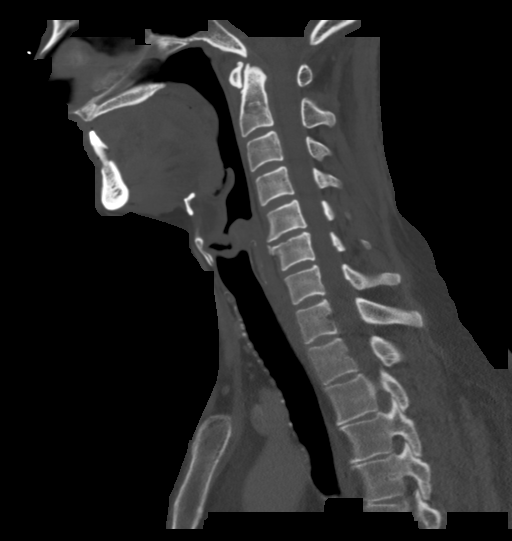
[im 47/80  bone]
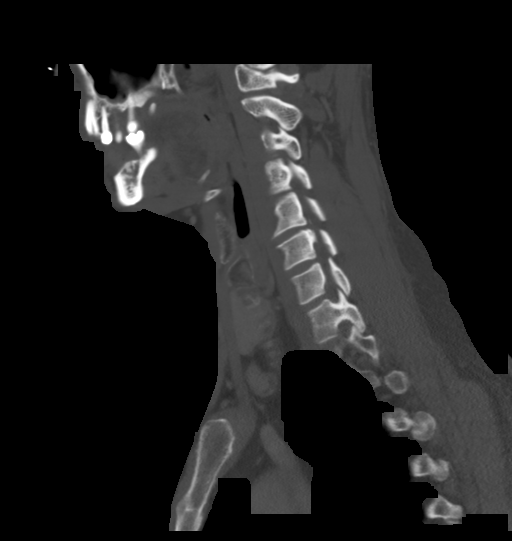
[im 53/80  bone]
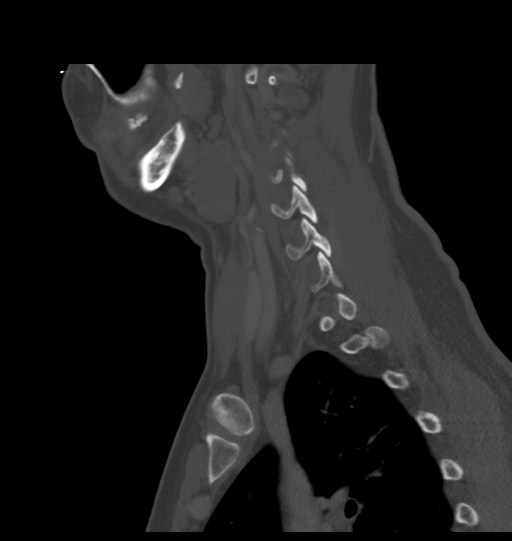

[13 of 33 positions shown; findings below may reference images not displayed]

FINDINGS: Pharynx and larynx: Normal. No mass or swelling.

Salivary glands: No inflammation, mass, or stone.

Thyroid: Normal.

Lymph nodes: Small bilateral cervical chain lymph nodes, nonenlarged
and similar to head CT from [DATE]. Index left level II node
measures 6 mm in short axis (series 3, image 35), unchanged. Right
level II lymph node on the same image measures 4 mm in short axis,
also unchanged. Bilateral mildly prominent but nonenlarged
submandibular nodes are also unchanged, including 7 mm short axis
nodes bilaterally (series 3, image 46). No necrotic/suppurative
nodes.

Vascular: Limited evaluation due to non arterial timing. Major
arteries in the neck appear grossly patent.

Limited intracranial: No obvious acute abnormality in the visualized
brain.

Visualized orbits: Negative.

Mastoids and visualized paranasal sinuses: Mild paranasal sinus
mucosal thickening. Clear visualized mastoid air cells.

Skeleton: Mild degenerative disease at C5-C6. Mild broad
dextrocurvature of the cervical spine.

Upper chest: Visualized lung apices are clear.
IMPRESSION: No change in size/appearance of the small/non-enlarged bilateral
cervical chain nodes identified on prior PET-CT.

## 2020-12-02 IMAGING — CT CT ABD-PELV W/O CM
2 of 4 series · 16 of 46 positions shown, 18 images · non-contrast
Comparison: PET-CT [DATE] and CT abdomen pelvis [DATE].

CLINICAL DATA: Splenomegaly

EXAM:
CT ABDOMEN AND PELVIS WITHOUT CONTRAST
TECHNIQUE: Multidetector CT imaging of the abdomen and pelvis was performed
following the standard protocol without IV contrast.

[Series 1: axial st · axial · 0.91mm/px · z∈[-732,-277]mm · 13 of 103 slices shown, 15 images]
[im 6/103  soft-tissue]
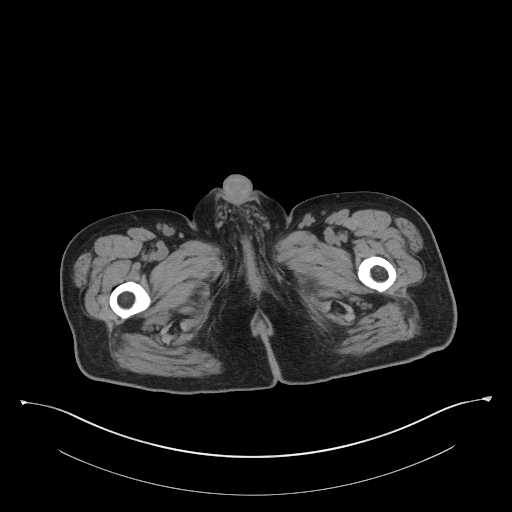
[im 6/103  bone]
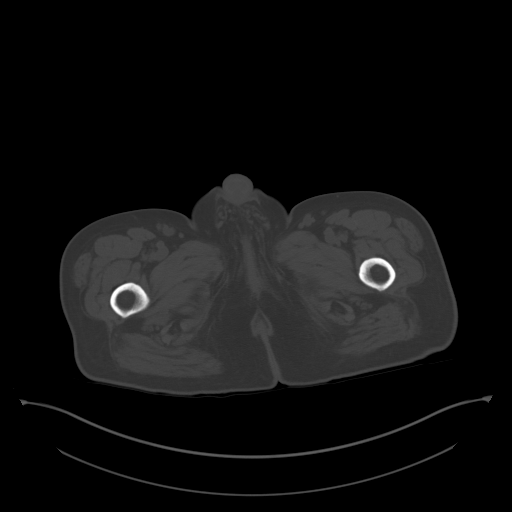
[im 17/103  soft-tissue]
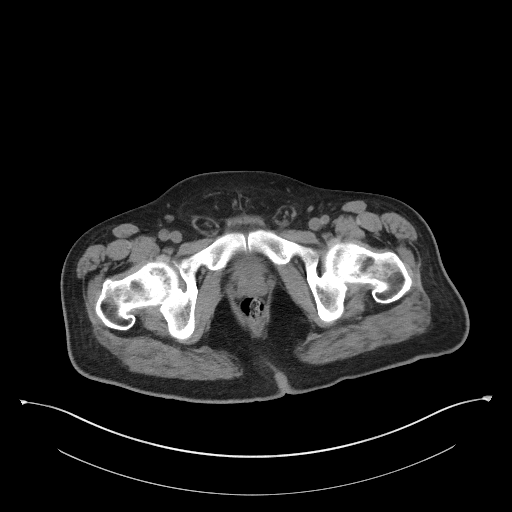
[im 22/103  soft-tissue]
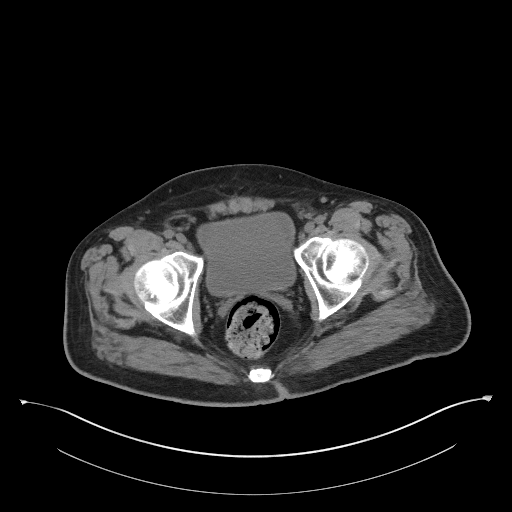
[im 27/103  soft-tissue]
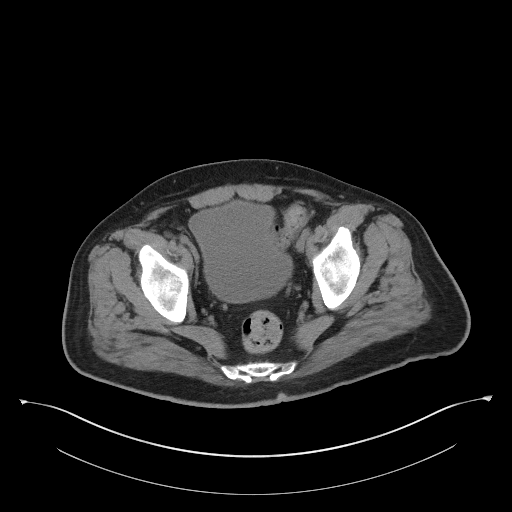
[im 38/103  soft-tissue]
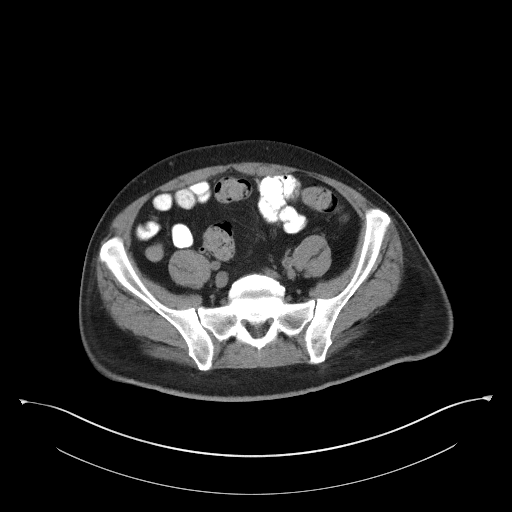
[im 43/103  soft-tissue]
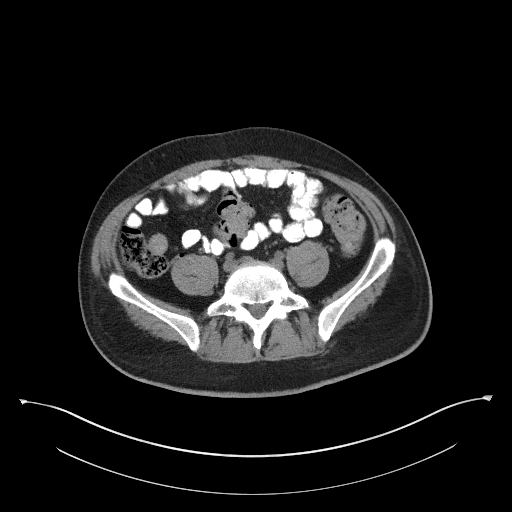
[im 54/103  soft-tissue]
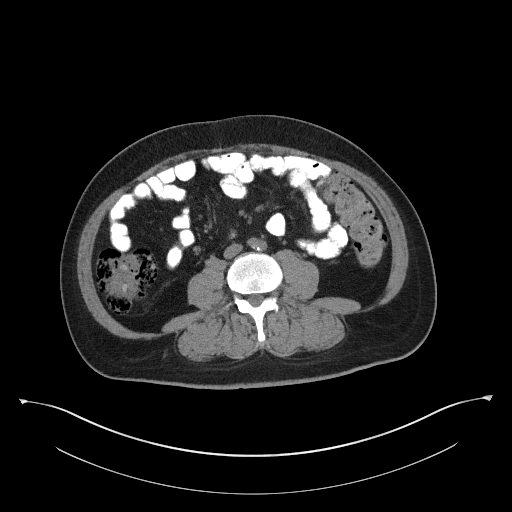
[im 60/103  soft-tissue]
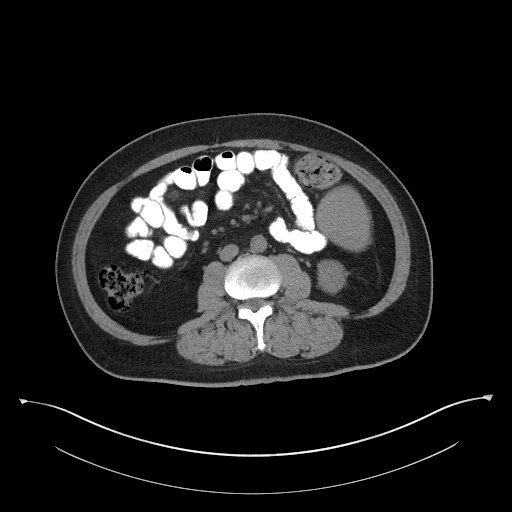
[im 65/103  soft-tissue]
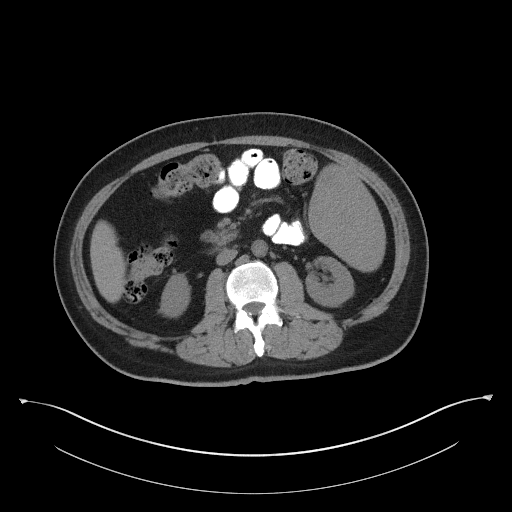
[im 65/103  bone]
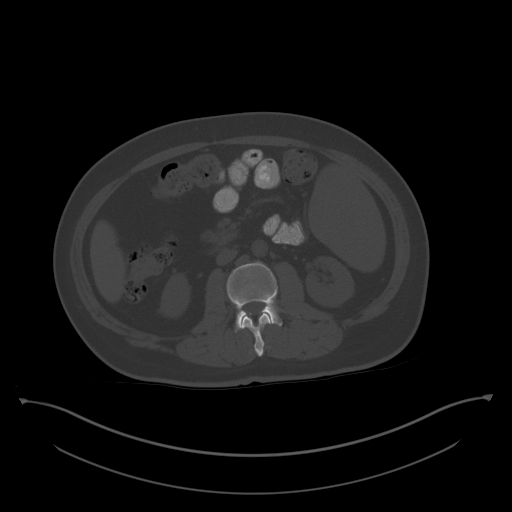
[im 76/103  soft-tissue]
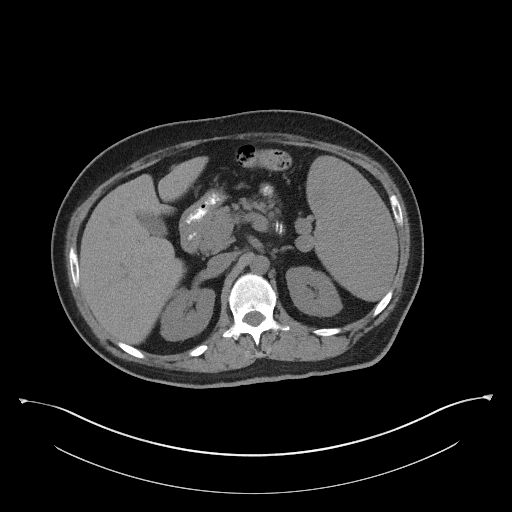
[im 81/103  soft-tissue]
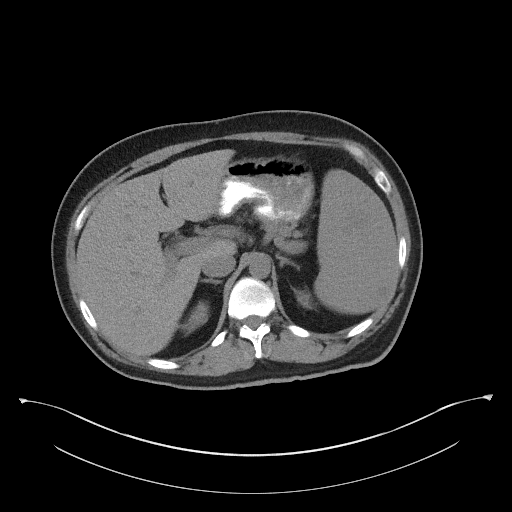
[im 86/103  soft-tissue]
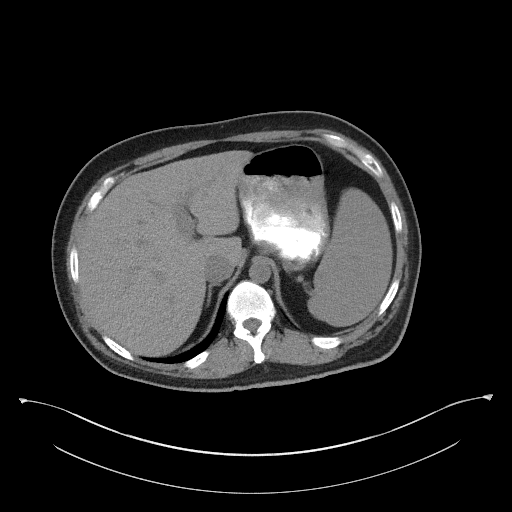
[im 97/103  soft-tissue]
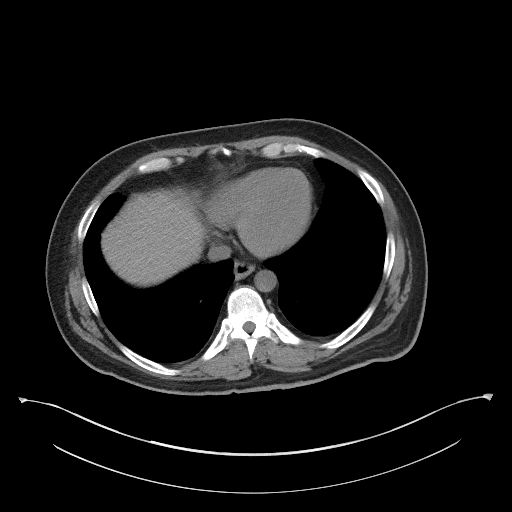

[Series 5: coronal st · coronal · 0.79mm/px · 3 of 100 slices shown]
[im 34/100  soft-tissue]
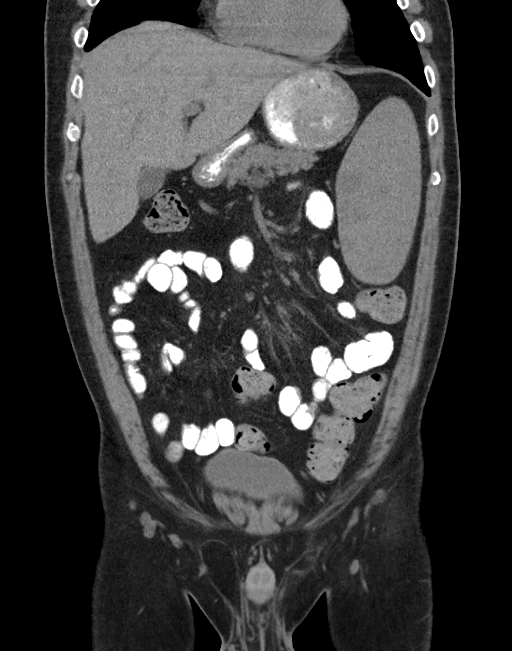
[im 45/100  soft-tissue]
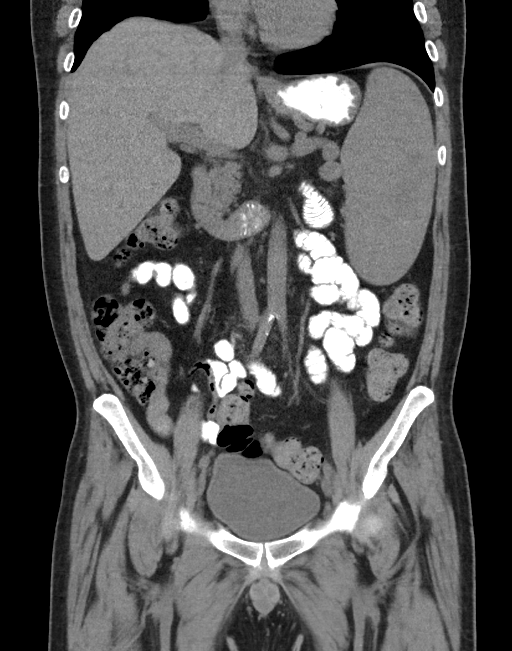
[im 56/100  soft-tissue]
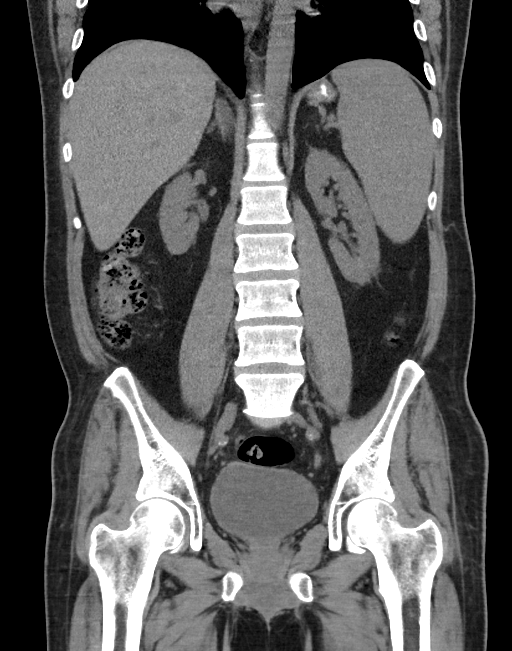

[16 of 46 positions shown; findings below may reference images not displayed]

FINDINGS: Lower chest: No acute abnormality.

Hepatobiliary: Unremarkable noncontrast appearance of the hepatic
parenchyma. Gallbladder is unremarkable. No biliary ductal dilation.

Pancreas: Within normal limits.

Spleen: Similar splenomegaly measuring 16.9 cm in maximum
craniocaudal dimension previously 16.7 cm when remeasured for
consistency. Similar size of the multiple splenic hypodensities.
Index lesions are as follows: Largest lesion measures 3 cm along the
posteroinferior aspect of the spleen on image [DATE], this is
unchanged when remeasured for consistency. Additional index lesion
along the anterior aspect of the spleen measures 2.4 cm, also
unchanged when remeasured for consistency. No new splenic lesions
visualized.

Adrenals/Urinary Tract: Bilateral adrenal glands are unremarkable.
No hydronephrosis. No renal, ureteral or bladder calculus
visualized. Urinary bladder is unremarkable for degree of
distension.

Stomach/Bowel: Stomach is within normal limits. Appendix appears
normal. No evidence of bowel wall thickening, distention, or
inflammatory changes.

Vascular/Lymphatic: Scattered aortic atherosclerosis without
abdominal aortic aneurysm. No pathologically enlarged abdominal or
pelvic lymph nodes.

Reproductive: Prostate is unremarkable.

Other: Fat containing right inguinal hernia. No abdominopelvic
ascites.

Musculoskeletal: No acute or significant osseous findings.
IMPRESSION: 1. Stable splenomegaly with no significant change in the multiple
splenic hypodensities.
2. No abdominopelvic adenopathy.
3. Aortic atherosclerosis ([L6]-[L6]).

## 2020-12-02 MED ORDER — IOHEXOL 300 MG/ML  SOLN
100.0000 mL | Freq: Once | INTRAMUSCULAR | Status: AC | PRN
  Administered 2020-12-02: 100 mL via INTRAVENOUS

## 2020-12-05 LAB — POCT I-STAT CREATININE: Creatinine, Ser: 1.6 mg/dL — ABNORMAL HIGH (ref 0.61–1.24)

## 2020-12-06 NOTE — Progress Notes (Signed)
[place], [place] [address]   CLINIC:  Medical Oncology/Hematology  PCP:  Minna Antis, DO [address] / [place] [address]  [phone]  REASON FOR VISIT:  Follow-up for thrombocytopenia and splenomegaly  PRIOR THERAPY: none  CURRENT THERAPY: surveillance  INTERVAL HISTORY:  Jose Koch, a 50 y.o. male, returns for routine follow-up for his thrombocytopenia and splenomegaly. [name] was last seen on 07/03/2020.  Today he reports feeling well. Yesterday he reports a small amount of blood in his vomit. He previously presented to the hospital in January for vomiting blood; he reports that the current amount of blood in his vomit is much less than this previous episode. He denies nosebleeds. He reports dizziness resulting in several falls including 3 falls yesterday. Before his fall yesterday he reports smelling smoke and his vision "rushing in." He denies losing consciousness. He is currently taking Lantus.   REVIEW OF SYSTEMS:  Review of Systems  Constitutional:  Positive for fatigue (40%). Negative for appetite change (70%).  HENT:   Negative for nosebleeds.   Respiratory:  Positive for cough.   Gastrointestinal:  Positive for nausea and vomiting.  Musculoskeletal:  Positive for arthralgias (4/10 shoulders and knees).  Neurological:  Positive for dizziness.  All other systems reviewed and are negative.  PAST MEDICAL/SURGICAL HISTORY:  Past Medical History:  Diagnosis Date   COVID    Diabetes mellitus without complication (HCC)    High cholesterol    Hypertension    Ichthyosis vulgaris    Past Surgical History:  Procedure Laterality Date   TONSILLECTOMY     wisdom teeth removal      SOCIAL HISTORY:  Social History   Socioeconomic History   Marital status: Divorced    Spouse name: Not on file   Number of children: Not on file   Years of education: Not on file   Highest education level: Not on file   Occupational History   Occupation: unemployed  Tobacco Use   Smoking status: Never   Smokeless tobacco: Never  Vaping Use   Vaping Use: Never used  Substance and Sexual Activity   Alcohol use: Not Currently   Drug use: Never   Sexual activity: Not on file  Other Topics Concern   Not on file  Social History Narrative   Right handed    Social Determinants of Health   Financial Resource Strain: Low Risk    Difficulty of Paying Living Expenses: Not hard at all  Food Insecurity: No Food Insecurity   Worried About Charity fundraiser in the Last Year: Never true   Arboriculturist in the Last Year: Never true  Transportation Needs: Unmet Transportation Needs   Lack of Transportation (Medical): Yes   Lack of Transportation (Non-Medical): Yes  Physical Activity: Sufficiently Active   Days of Exercise per Week: 7 days   Minutes of Exercise per Session: 30 min  Stress: Stress Concern Present   Feeling of Stress : Very much  Social Connections: Socially Isolated   Frequency of Communication with Friends and Family: More than three times a week   Frequency of Social Gatherings with Friends and Family: More than three times a week   Attends Religious Services: Never   Marine scientist or Organizations: No   Attends Archivist Meetings: Never   Marital Status: Divorced  Human resources officer Violence: Not At Risk   Fear of Current or Ex-Partner: No  Emotionally Abused: No   Physically Abused: No   Sexually Abused: No    FAMILY HISTORY:  Family History  Family history unknown: Yes    CURRENT MEDICATIONS:  Current Outpatient Medications  Medication Sig Dispense Refill   blood glucose meter kit and supplies KIT Dispense based on patient and insurance preference (something with affordable test strips). Use to check glucose twice daily as directed. 1 each 0   Cyanocobalamin (VITAMIN B 12 PO) Take 1 mg by mouth.     glucose blood (ACCU-CHEK GUIDE) test strip Use as  instructed to monitor glucose twice daily, before breakfast and before bed 100 each 12   insulin glargine (LANTUS) 100 UNIT/ML Solostar Pen Inject 30 Units into the skin daily. 15 mL 11   Insulin Pen Needle 31G X 5 MM MISC 1 Units by Does not apply route daily. 100 each 1   metFORMIN (GLUCOPHAGE XR) 500 MG 24 hr tablet Take 1 tablet (500 mg total) by mouth daily with breakfast. 90 tablet 0   metoCLOPramide (REGLAN) 10 MG tablet Take 1 tablet (10 mg total) by mouth 3 (three) times daily with meals. 90 tablet 1   OneTouch Delica Lancets 84Z MISC PLEASE SEE ATTACHED FOR DETAILED DIRECTIONS     pantoprazole (PROTONIX) 40 MG tablet TAKE 1 TABLET BY MOUTH 2 TIMES DAILY BEFORE A MEAL 30 MINUTES BEFORE BREAKFAST AND DINNER 60 tablet 3   vitamin C (ASCORBIC ACID) 500 MG tablet Take 500 mg by mouth daily.     VITAMIN D PO Take 1 mg by mouth.     No current facility-administered medications for this visit.    ALLERGIES:  No Known Allergies  PHYSICAL EXAM:  Performance status (ECOG): 0 - Asymptomatic  There were no vitals filed for this visit. Wt Readings from Last 3 Encounters:  08/18/20 165 lb (74.8 kg)  07/03/20 169 lb (76.7 kg)  06/13/20 169 lb (76.7 kg)   Physical Exam Vitals reviewed.  Constitutional:      Appearance: Normal appearance.  Cardiovascular:     Rate and Rhythm: Normal rate and regular rhythm.     Pulses: Normal pulses.     Heart sounds: Normal heart sounds.  Pulmonary:     Effort: Pulmonary effort is normal.     Breath sounds: Normal breath sounds.  Abdominal:     Palpations: Abdomen is soft. There is no hepatomegaly, splenomegaly or mass.     Tenderness: There is no abdominal tenderness.  Neurological:     General: No focal deficit present.     Mental Status: He is alert and oriented to person, place, and time.  Psychiatric:        Mood and Affect: Mood normal.        Behavior: Behavior normal.    LABORATORY DATA:  I have reviewed the labs as listed.  CBC  Latest Ref Rng & Units 10/24/2020 05/27/2020 05/16/2020  WBC 4.0 - 10.5 K/uL 5.1 4.9 5.0  Hemoglobin 13.0 - 17.0 g/dL 11.6(L) 12.2(L) 12.0(L)  Hematocrit 39.0 - 52.0 % 34.7(L) 37.3(L) 36.7(L)  Platelets 150 - 400 K/uL 95(L) 100(L) 115(L)   CMP Latest Ref Rng & Units 12/02/2020 05/16/2020 04/07/2020  Glucose 65 - 99 mg/dL - 358(H) 109(H)  BUN 7 - 25 mg/dL - 32(H) 16  Creatinine 0.61 - 1.24 mg/dL 1.60(H) 1.56(H) 1.22  Sodium 135 - 146 mmol/L - 137 136  Potassium 3.5 - 5.3 mmol/L - 4.4 3.0(L)  Chloride 98 - 110 mmol/L - 102 102  CO2 20 - 32 mmol/L - 24 27  Calcium 8.6 - 10.3 mg/dL - 8.6 8.2(L)  Total Protein 6.1 - 8.1 g/dL - 6.6 5.7(L)  Total Bilirubin 0.2 - 1.2 mg/dL - 0.5 1.0  Alkaline Phos 38 - 126 U/L - - 72  AST 10 - 40 U/L - 15 15  ALT 9 - 46 U/L - 23 16      Component Value Date/Time   RBC 4.15 (L) 10/24/2020 1002   MCV 83.6 10/24/2020 1002   MCH 28.0 10/24/2020 1002   MCHC 33.4 10/24/2020 1002   RDW 13.8 10/24/2020 1002   LYMPHSABS 1.2 10/24/2020 1002   MONOABS 0.3 10/24/2020 1002   EOSABS 0.1 10/24/2020 1002   BASOSABS 0.0 10/24/2020 1002    DIAGNOSTIC IMAGING:  I have independently reviewed the scans and discussed with the patient. CT Abdomen Pelvis Wo Contrast  Result Date: 12/02/2020 CLINICAL DATA:  Splenomegaly EXAM: CT ABDOMEN AND PELVIS WITHOUT CONTRAST TECHNIQUE: Multidetector CT imaging of the abdomen and pelvis was performed following the standard protocol without IV contrast. COMPARISON:  PET-CT Jun 30, 2020 and CT abdomen pelvis April 05, 2020. FINDINGS: Lower chest: No acute abnormality. Hepatobiliary: Unremarkable noncontrast appearance of the hepatic parenchyma. Gallbladder is unremarkable. No biliary ductal dilation. Pancreas: Within normal limits. Spleen: Similar splenomegaly measuring 16.9 cm in maximum craniocaudal dimension previously 16.7 cm when remeasured for consistency. Similar size of the multiple splenic hypodensities. Index lesions are as follows:  Largest lesion measures 3 cm along the posteroinferior aspect of the spleen on image 31/3, this is unchanged when remeasured for consistency. Additional index lesion along the anterior aspect of the spleen measures 2.4 cm, also unchanged when remeasured for consistency. No new splenic lesions visualized. Adrenals/Urinary Tract: Bilateral adrenal glands are unremarkable. No hydronephrosis. No renal, ureteral or bladder calculus visualized. Urinary bladder is unremarkable for degree of distension. Stomach/Bowel: Stomach is within normal limits. Appendix appears normal. No evidence of bowel wall thickening, distention, or inflammatory changes. Vascular/Lymphatic: Scattered aortic atherosclerosis without abdominal aortic aneurysm. No pathologically enlarged abdominal or pelvic lymph nodes. Reproductive: Prostate is unremarkable. Other: Fat containing right inguinal hernia. No abdominopelvic ascites. Musculoskeletal: No acute or significant osseous findings. IMPRESSION: 1. Stable splenomegaly with no significant change in the multiple splenic hypodensities. 2. No abdominopelvic adenopathy. 3. Aortic atherosclerosis (ICD10-I70.0). Electronically Signed   By: Dahlia Bailiff M.D.   On: 12/02/2020 12:52   CT SOFT TISSUE NECK W CONTRAST  Result Date: 12/03/2020 CLINICAL DATA:  Lymphadenopathy, neck follow up on lymp nodes neck EXAM: CT NECK WITH CONTRAST TECHNIQUE: Multidetector CT imaging of the neck was performed using the standard protocol following the bolus administration of intravenous contrast. CONTRAST:  167mL OMNIPAQUE IOHEXOL 300 MG/ML  SOLN COMPARISON:  PET-CT Jun 30, 2020. FINDINGS: Pharynx and larynx: Normal. No mass or swelling. Salivary glands: No inflammation, mass, or stone. Thyroid: Normal. Lymph nodes: Small bilateral cervical chain lymph nodes, nonenlarged and similar to head CT from Jun 30, 2020. Index left level II node measures 6 mm in short axis (series 3, image 35), unchanged. Right level II lymph  node on the same image measures 4 mm in short axis, also unchanged. Bilateral mildly prominent but nonenlarged submandibular nodes are also unchanged, including 7 mm short axis nodes bilaterally (series 3, image 46). No necrotic/suppurative nodes. Vascular: Limited evaluation due to non arterial timing. Major arteries in the neck appear grossly patent. Limited intracranial: No obvious acute abnormality in the visualized brain. Visualized orbits: Negative. Mastoids and visualized  paranasal sinuses: Mild paranasal sinus mucosal thickening. Clear visualized mastoid air cells. Skeleton: Mild degenerative disease at C5-C6. Mild broad dextrocurvature of the cervical spine. Upper chest: Visualized lung apices are clear. IMPRESSION: No change in size/appearance of the small/non-enlarged bilateral cervical chain nodes identified on prior PET-CT. Electronically Signed   By: Margaretha Sheffield M.D.   On: 12/03/2020 08:31     ASSESSMENT:  1.  Splenomegaly with multiple lesions: -Recent hospitalization with nausea and vomiting. -CTAP on 04/05/2020 showed enlarged spleen measuring 15 cm with multiple hypodense soft tissue density lesions measuring 2.3 cm, 2.7 cm.  No significant adenopathy.  Liver is diffusely hypodense consistent with fatty infiltration. -He had weight loss during recent hospitalization and is gaining it back. -No recurrent infections. -PET scan on 06/30/2020 images were reviewed with him. - Mild hypermetabolic small bilateral cervical lymph nodes, indeterminate.  Clinically not palpable. - No splenic hypermetabolism.  Spleen size slightly improved to 2 fingerbreadths below the left costal margin on inspiration. - EBV serology consistent with previous infection.  CMV was negative.  Other nutritional deficiency work-up for thrombocytopenia was negative. - Methylmalonic acid was slightly elevated.  Recommend vitamin B12 1 mg tablet daily.   2.  Social/family history: -He takes care of his girlfriend.   He worked previously as a Programmer, applications.  He was never smoker.  Rarely drinks alcohol. -Mother was adopted.  Does not know his father.   PLAN:  1.  Splenomegaly with multiple lesions: - We reviewed CTAP from 12/02/2020 which showed stable splenomegaly with no significant change in multiple splenic hypodensities.  No abdominal pelvic adenopathy. - He does not have any B symptoms. - He reportedly fell x3 in the last week.  His blood pressure is borderline at 101/70.  I have recommended him to check blood pressure at home.  He is not on any antihypertensives.  He is also diabetic.  I have recommended him to check blood sugars. - He reported smelling "smoke" prior to each fall.  No loss of consciousness. - We reviewed CT soft tissue neck which was negative. - Will recommend neurology consultation. - RTC 4 months with repeat labs.   2.  Mild thrombocytopenia: - He has mild thrombocytopenia since 2017 with platelet count ranging between 56-125. - Most likely from splenomegaly. - Reviewed CTAP without contrast from 12/02/2020 which showed splenomegaly measuring 16.9 cm. - CBC on 10/24/2020 showed platelet count 95.  No bleeding issues.  Orders placed this encounter:  No orders of the defined types were placed in this encounter.    Derek Jack, MD [place] [phone]   I, Thana Ates, am acting as a scribe for Dr. Derek Jack.  I, Derek Jack MD, have reviewed the above documentation for accuracy and completeness, and I agree with the above.

## 2020-12-08 ENCOUNTER — Inpatient Hospital Stay (HOSPITAL_COMMUNITY): Payer: PRIVATE HEALTH INSURANCE | Attending: Hematology and Oncology | Admitting: Hematology

## 2020-12-08 ENCOUNTER — Other Ambulatory Visit: Payer: Self-pay

## 2020-12-08 VITALS — BP 101/70 | HR 99 | Temp 97.5°F | Resp 20 | Wt 161.4 lb

## 2020-12-08 DIAGNOSIS — I10 Essential (primary) hypertension: Secondary | ICD-10-CM | POA: Diagnosis not present

## 2020-12-08 DIAGNOSIS — D696 Thrombocytopenia, unspecified: Secondary | ICD-10-CM | POA: Diagnosis not present

## 2020-12-08 DIAGNOSIS — E119 Type 2 diabetes mellitus without complications: Secondary | ICD-10-CM | POA: Insufficient documentation

## 2020-12-08 DIAGNOSIS — Z794 Long term (current) use of insulin: Secondary | ICD-10-CM | POA: Diagnosis not present

## 2020-12-08 DIAGNOSIS — R161 Splenomegaly, not elsewhere classified: Secondary | ICD-10-CM | POA: Diagnosis not present

## 2020-12-08 DIAGNOSIS — R296 Repeated falls: Secondary | ICD-10-CM | POA: Insufficient documentation

## 2020-12-08 NOTE — Patient Instructions (Signed)
[facility] at [facility] Discharge Instructions  You were seen and examined today by Dr. Ellin Saba. He discussed your most recent labs and scans and everything looks stable. Please follow up as scheduled.   Thank you for choosing [facility] at [facility] to provide your oncology and hematology care.  To afford each patient quality time with our provider, please arrive at least 15 minutes before your scheduled appointment time.   If you have a lab appointment with the Cancer Center please come in thru the Main Entrance and check in at the main information desk.  You need to re-schedule your appointment should you arrive 10 or more minutes late.  We strive to give you quality time with our providers, and arriving late affects you and other patients whose appointments are after yours.  Also, if you no show three or more times for appointments you may be dismissed from the clinic at the providers discretion.     Again, thank you for choosing [facility].  Our hope is that these requests will decrease the amount of time that you wait before being seen by our physicians.       _____________________________________________________________  Should you have questions after your visit to [facility], please contact our office at [phone] and follow the prompts.  Our office hours are 8:00 a.m. and 4:30 p.m. Monday - Friday.  Please note that voicemails left after 4:00 p.m. may not be returned until the following business day.  We are closed weekends and major holidays.  You do have access to a nurse 24-7, just call the main number to the clinic [phone] and do not press any options, hold on the line and a nurse will answer the phone.    For prescription refill requests, have your pharmacy contact our office and allow 72 hours.    Due to Covid, you will need to wear a mask upon entering the hospital. If you do not have a mask,  a mask will be given to you at the Main Entrance upon arrival. For doctor visits, patients may have 1 support person age 61 or older with them. For treatment visits, patients can not have anyone with them due to social distancing guidelines and our immunocompromised population.
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
## 2020-12-26 ENCOUNTER — Other Ambulatory Visit: Payer: Self-pay

## 2020-12-26 ENCOUNTER — Encounter: Payer: Self-pay | Admitting: Neurology

## 2020-12-26 ENCOUNTER — Ambulatory Visit (INDEPENDENT_AMBULATORY_CARE_PROVIDER_SITE_OTHER): Payer: Medicaid - Out of State | Admitting: Neurology

## 2020-12-26 VITALS — BP 78/52 | HR 94 | Ht 68.0 in | Wt 166.4 lb

## 2020-12-26 DIAGNOSIS — R569 Unspecified convulsions: Secondary | ICD-10-CM | POA: Diagnosis not present

## 2020-12-26 DIAGNOSIS — I618 Other nontraumatic intracerebral hemorrhage: Secondary | ICD-10-CM

## 2020-12-26 DIAGNOSIS — Q283 Other malformations of cerebral vessels: Secondary | ICD-10-CM | POA: Diagnosis not present

## 2020-12-26 NOTE — Patient Instructions (Signed)
Sleep-deprived EEG Plan will be to start a seizure medication No driving  Follow up 6 months or sooner

## 2021-01-08 ENCOUNTER — Other Ambulatory Visit: Payer: Self-pay

## 2021-01-08 ENCOUNTER — Ambulatory Visit (INDEPENDENT_AMBULATORY_CARE_PROVIDER_SITE_OTHER): Payer: Self-pay | Admitting: Neurology

## 2021-01-08 DIAGNOSIS — R569 Unspecified convulsions: Secondary | ICD-10-CM | POA: Diagnosis not present
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
## 2021-01-12 ENCOUNTER — Telehealth: Payer: Self-pay

## 2021-01-12 NOTE — Telephone Encounter (Signed)
-----   Message from Drema Dallas, DO sent at 01/09/2021  6:39 AM EST ----- EEG is normal.  However, this does not rule out seizure.  As discussed at his visit, I still want to start an anti-seizure medication - start Keppra 500mg  twice daily.

## 2021-01-12 NOTE — Telephone Encounter (Signed)
Pt girlfriend advised per DR.Jaffe. Keppra shouldn't be a problem with his other medications    PA for CTA resent to Patient insurance for CTA.

## 2021-01-12 NOTE — Progress Notes (Signed)
Per Rep Website having Technical issues, it will not allow for the staff as well as providers to print off PA forms for Medicaid patients.  Please fax over cover sheet with the request for CTA over to [phone].    Cover sheet as well as letter with letter head, Insurance card and demographics faxed over as requested.

## 2021-01-12 NOTE — Telephone Encounter (Signed)
Per DPR Okay to speak to pt girlfriend Truddie Crumble of Pt EEG results and recommendation to start Keppra.  Please advise, If there are any contraindications to his other medications?

## 2021-01-13 NOTE — Telephone Encounter (Signed)
Resent PA with Rendering location and clinicals.

## 2021-01-21 NOTE — Progress Notes (Signed)
PA Pending as of 01/21/21.

## 2021-01-22 NOTE — Telephone Encounter (Signed)
No ma'am 

## 2021-01-26 NOTE — Progress Notes (Signed)
Spoke to LU at [facility]. Code worn on approval please have it changed to 70496.  Per Lu PA number [id number] pending may take up to 5 days for approval.

## 2021-01-30 NOTE — Progress Notes (Signed)
Patient is scheduled for Feb 03, 2021 at 845 am to arrive at [facility] radiology. Date of service 03/14/2020-04/14/2021.

## 2021-02-03 ENCOUNTER — Encounter (HOSPITAL_COMMUNITY): Payer: Self-pay

## 2021-02-03 ENCOUNTER — Ambulatory Visit (HOSPITAL_COMMUNITY)
Admission: RE | Admit: 2021-02-03 | Discharge: 2021-02-03 | Disposition: A | Discharge status: Home / Self Care | Payer: Medicaid - Out of State | Source: Ambulatory Visit | Attending: Neurology | Admitting: Neurology

## 2021-02-03 ENCOUNTER — Other Ambulatory Visit: Payer: Self-pay

## 2021-02-03 ENCOUNTER — Telehealth: Payer: Self-pay

## 2021-02-03 DIAGNOSIS — R9089 Other abnormal findings on diagnostic imaging of central nervous system: Secondary | ICD-10-CM | POA: Diagnosis present

## 2021-02-03 DIAGNOSIS — I6521 Occlusion and stenosis of right carotid artery: Secondary | ICD-10-CM

## 2021-02-03 LAB — POCT I-STAT CREATININE: Creatinine, Ser: 1.7 mg/dL — ABNORMAL HIGH (ref 0.61–1.24)

## 2021-02-03 IMAGING — CT CT ANGIO HEAD
3 of 11 series · 14 of 47 positions shown · IV contrast (Omnipaque or Isovue)
Comparison: MRI [DATE]

CLINICAL DATA: Abnormal brain MRI. Right-sided nontraumatic
intracranial hemorrhage. Right-sided facial numbness.

EXAM:
CT ANGIOGRAPHY HEAD
TECHNIQUE: Multidetector CT imaging of the head was performed using the
standard protocol during bolus administration of intravenous
contrast. Multiplanar CT image reconstructions and MIPs were
obtained to evaluate the vascular anatomy.
CONTRAST:  60mL OMNIPAQUE IOHEXOL 350 MG/ML SOLN

[Series 8: coronal soft · coronal · 0.32mm/px · 1 of 67 slices shown]
[im 34/67  brain]
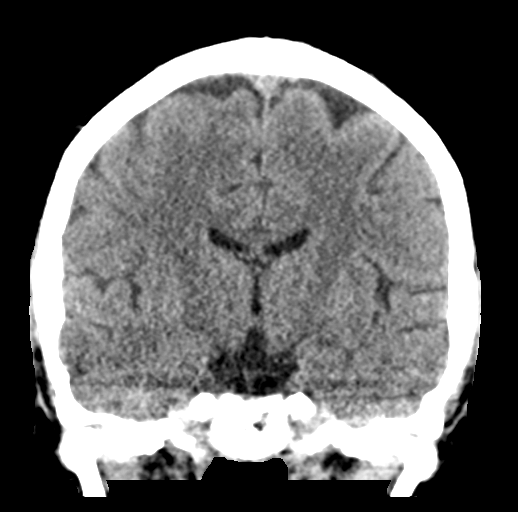

[Series 12: ax thin · axial · 0.35mm/px · z∈[+72,+196]mm · 10 of 148 slices shown]
[im 12/148  brain]
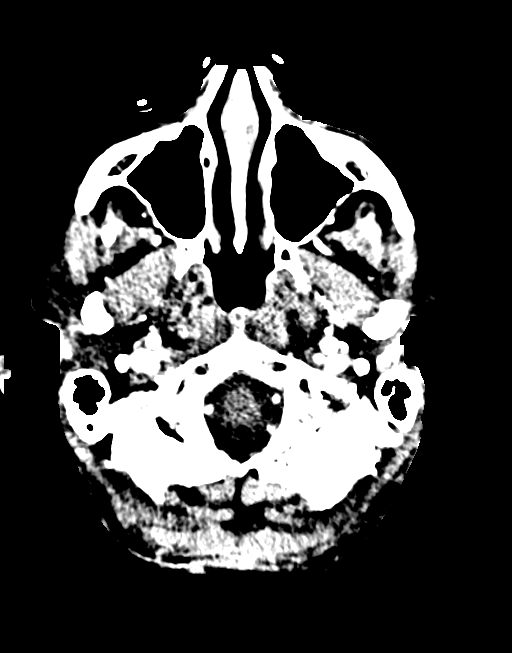
[im 23/148  bone]
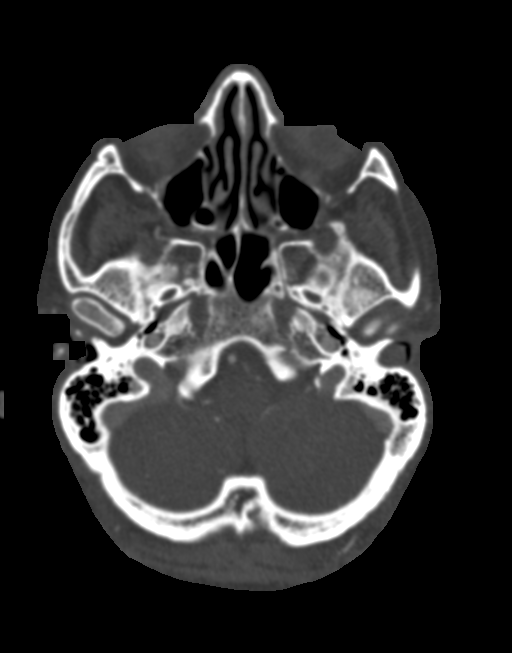
[im 46/148  brain]
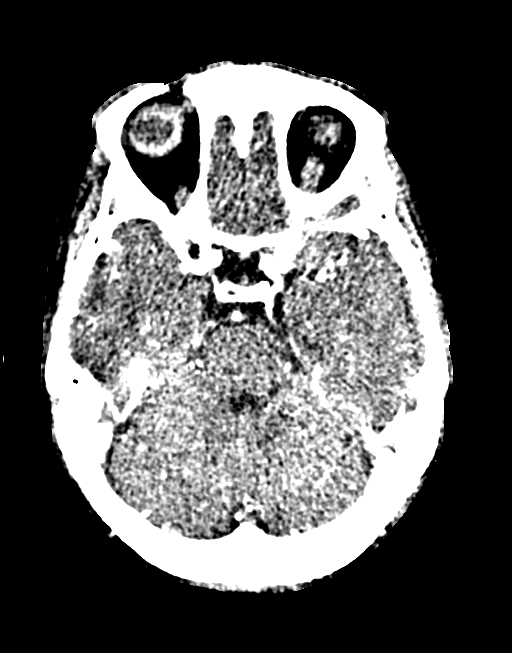
[im 57/148  bone]
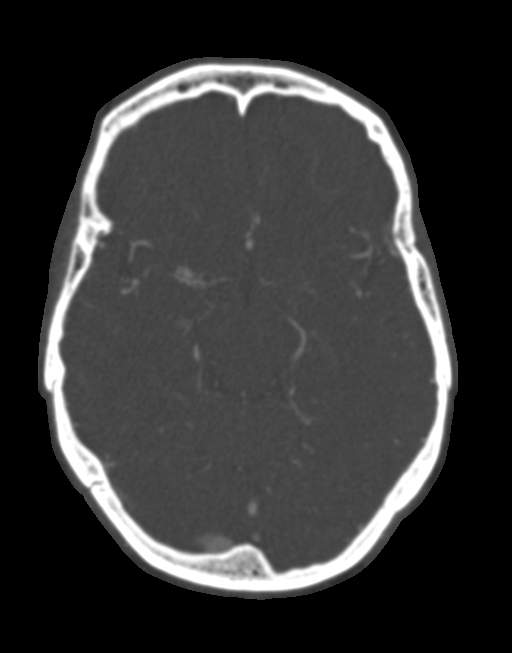
[im 68/148  brain]
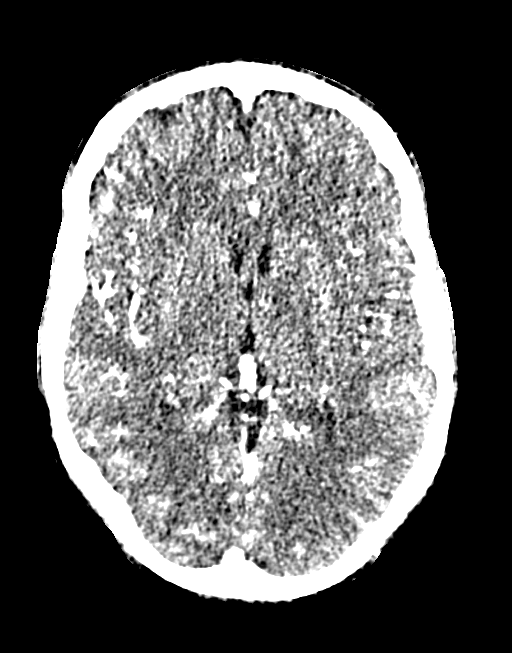
[im 80/148  bone]
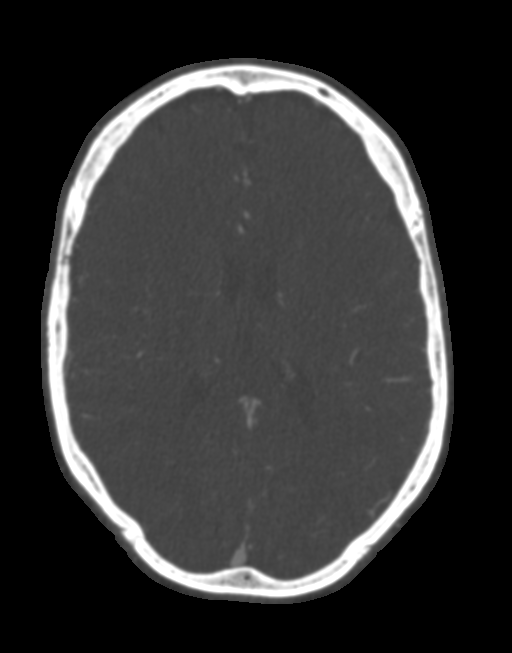
[im 91/148  brain]
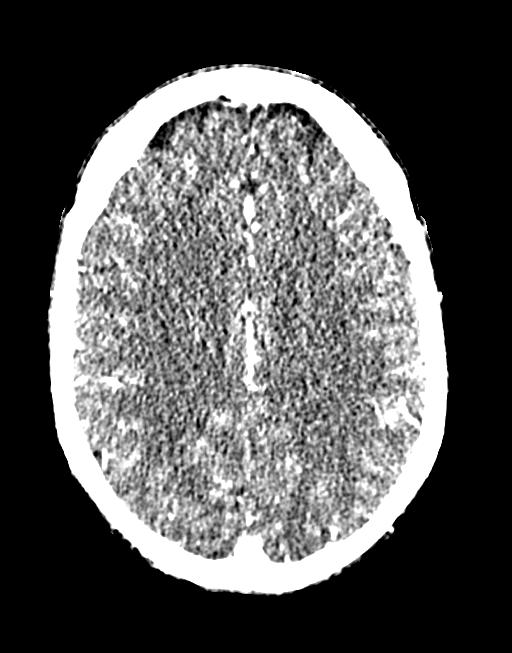
[im 114/148  bone]
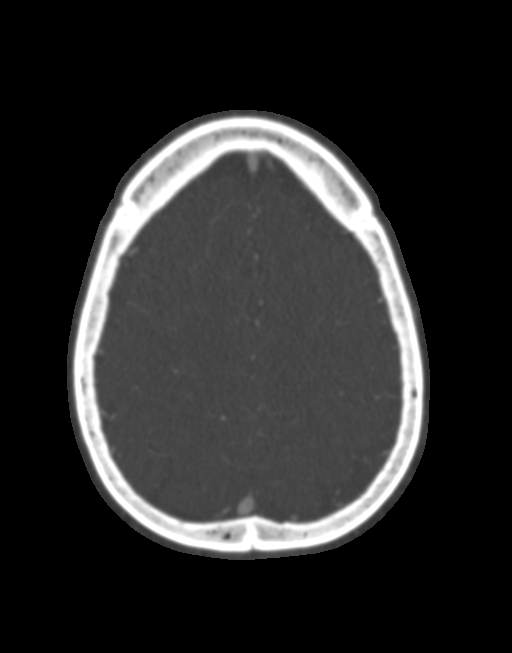
[im 125/148  brain]
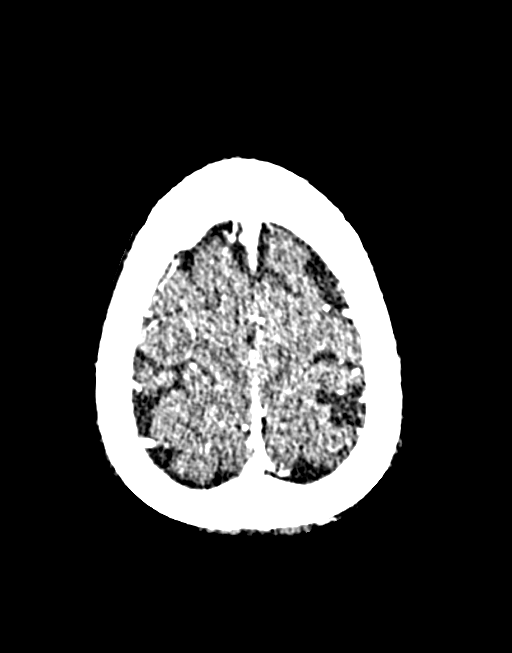
[im 136/148  bone]
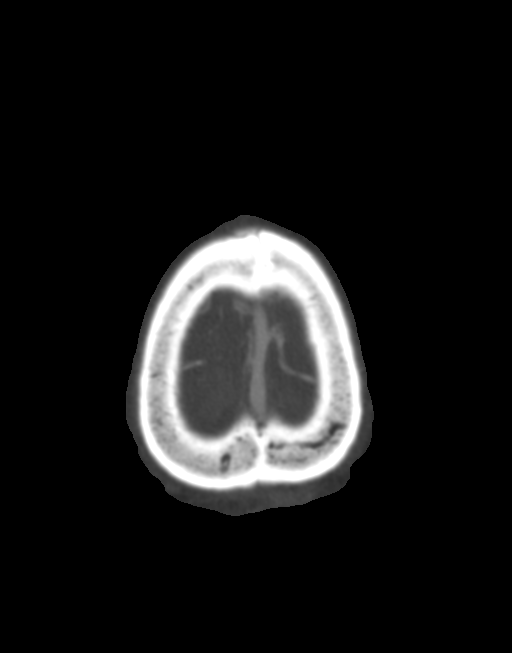

[Series 16: sag thin · sagittal · 0.29mm/px · 3 of 169 slices shown]
[im 43/169  brain]
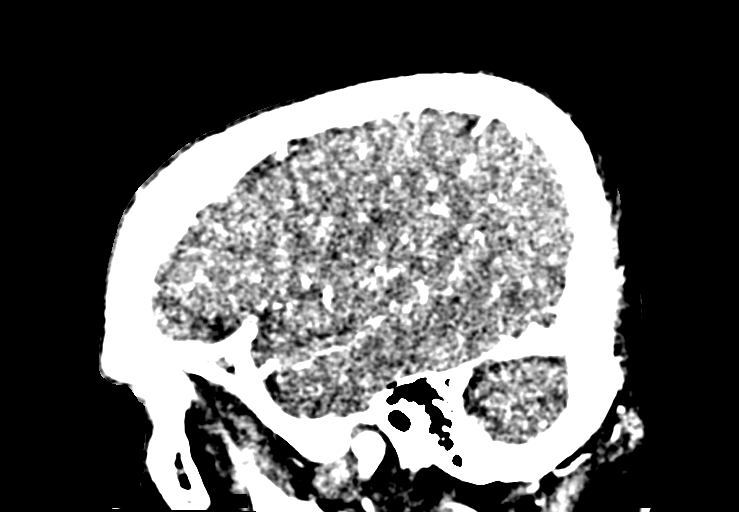
[im 85/169  brain]
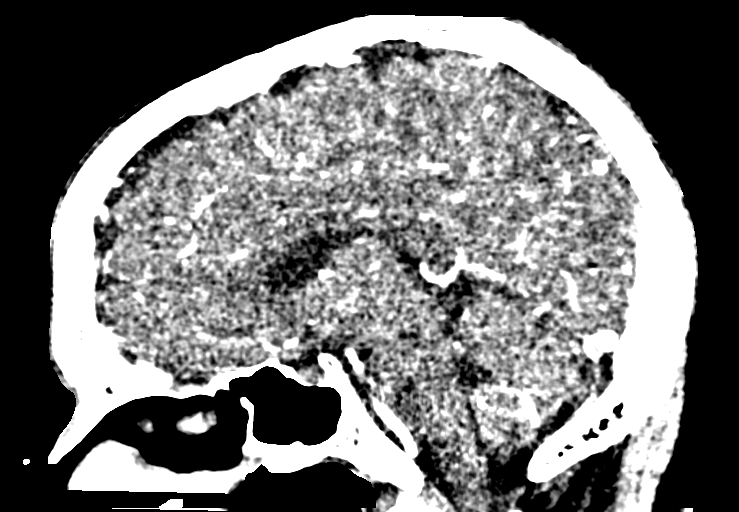
[im 127/169  brain]
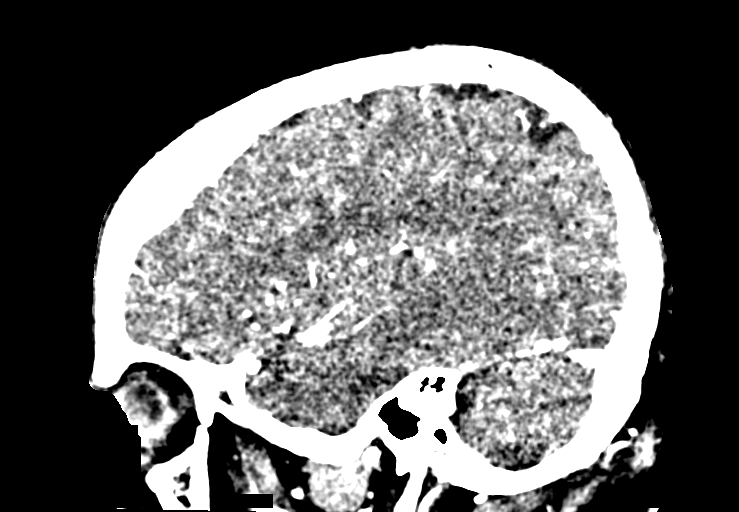

[14 of 47 positions shown; findings below may reference images not displayed]

FINDINGS: CT HEAD

Brain: No acute finding. The brainstem and cerebellum are normal. 8
mm cavernoma in the right parietal white matter adjacent to the
posterior body of the right lateral ventricle is unchanged without
evidence of acute hemorrhage. Minimal gliosis and hemosiderin
deposition in the right occipital lobe shown by MRI cannot be
appreciated by CT. Brain otherwise appears normal. No evidence of
hydrocephalus or extra-axial collection.

Vascular: There is atherosclerotic calcification of the major
vessels at the base of the brain.

Skull: Negative

Sinuses: Clear/normal

Other: None

CTA HEAD

Anterior circulation: Both internal carotid arteries are patent
through the skull base and siphon regions. The patient does have
considerable narrowing and irregularity the proximal horizontal
segment of the carotid siphon on the right. There is marked
irregularity. Maximal stenosis is probably 80% or greater. The
patient would be at considerable risk of occlusion or embolic
disease from this disease and neuro interventional consultation is
suggested for consideration of stent placement. Distal siphon and
supraclinoid ICA are widely patent. Right anterior and middle
cerebral vessels are normal. On the left, there is minimal
atherosclerotic irregularity in the carotid siphon but no measurable
stenosis. Fetal origin of the posterior cerebral artery on the left.
Left anterior and middle cerebral vessels appear normal.

Posterior circulation: Both vertebral arteries are patent through
the foramen magnum to the basilar. No basilar stenosis. Superior
cerebellar and posterior cerebral arteries are patent.

Venous sinuses: Patent and normal.

Anatomic variants: None significant.

Review of the MIP images confirms the above findings.
IMPRESSION: Head CT shows no change or acute finding. 8 mm cavernoma in the
right parietal white matter adjacent to the posterior body of the
right lateral ventricle without evidence acute hemorrhage.

CT angiography confirms pronounced narrowing and irregularity of the
proximal ICA siphon on the right. Maximal stenosis is estimated at
80% or greater. Patient would seem to be at considerable risk of an
embolic event or occlusion. Neuro interventional consultation
suggested for consideration stent placement.

Patient does have a patent anterior communicating artery and patent
right posterior communicating artery, which would be somewhat
protective in the instance of right carotid occlusion.

## 2021-02-03 MED ORDER — IOHEXOL 350 MG/ML SOLN
100.0000 mL | Freq: Once | INTRAVENOUS | Status: AC | PRN
  Administered 2021-02-03: 60 mL via INTRAVENOUS

## 2021-02-03 NOTE — Telephone Encounter (Signed)
-----   Message from Drema Dallas, DO sent at 02/03/2021 12:03 PM EST ----- I already spoke to Mr. Cadenhead regarding CTA results.  I would like to refer him to Dr. Corliss Skains for intracranial right internal carotid artery stenosis.

## 2021-02-17 ENCOUNTER — Telehealth (HOSPITAL_COMMUNITY): Payer: Self-pay

## 2021-02-17 ENCOUNTER — Other Ambulatory Visit (HOSPITAL_COMMUNITY): Payer: Self-pay | Admitting: Interventional Radiology

## 2021-02-17 DIAGNOSIS — I6521 Occlusion and stenosis of right carotid artery: Secondary | ICD-10-CM

## 2021-02-17 NOTE — Telephone Encounter (Signed)
Called to schedule consult with Dr. Deveshwar, no answer, left vm. AW  

## 2021-03-04 ENCOUNTER — Other Ambulatory Visit: Payer: Self-pay

## 2021-03-04 ENCOUNTER — Ambulatory Visit (HOSPITAL_COMMUNITY)
Admission: RE | Admit: 2021-03-04 | Discharge: 2021-03-04 | Disposition: A | Discharge status: Home / Self Care | Payer: Medicaid - Out of State | Source: Ambulatory Visit | Attending: Interventional Radiology | Admitting: Interventional Radiology

## 2021-03-04 DIAGNOSIS — I6521 Occlusion and stenosis of right carotid artery: Secondary | ICD-10-CM

## 2021-03-05 ENCOUNTER — Other Ambulatory Visit (HOSPITAL_COMMUNITY): Payer: Self-pay | Admitting: Interventional Radiology

## 2021-03-05 DIAGNOSIS — I771 Stricture of artery: Secondary | ICD-10-CM

## 2021-03-05 HISTORY — PX: IR RADIOLOGIST EVAL & MGMT: IMG5224

## 2021-03-11 ENCOUNTER — Telehealth (HOSPITAL_COMMUNITY): Payer: Self-pay

## 2021-03-11 NOTE — Telephone Encounter (Signed)
Called to schedule diagnostic angiogram, no answer, left vm. AW  

## 2021-03-12 ENCOUNTER — Telehealth (HOSPITAL_COMMUNITY): Payer: Self-pay

## 2021-03-12 NOTE — Telephone Encounter (Signed)
Called pt regarding upcoming procedure, no answer, left vm. AW  

## 2021-03-18 ENCOUNTER — Other Ambulatory Visit: Payer: Self-pay | Admitting: Student

## 2021-03-18 ENCOUNTER — Other Ambulatory Visit: Payer: Self-pay | Admitting: Radiology

## 2021-03-19 ENCOUNTER — Encounter (HOSPITAL_COMMUNITY): Payer: Self-pay

## 2021-03-19 ENCOUNTER — Ambulatory Visit (HOSPITAL_COMMUNITY)
Admission: RE | Admit: 2021-03-19 | Discharge: 2021-03-19 | Disposition: A | Discharge status: Home / Self Care | Payer: Medicaid - Out of State | Source: Ambulatory Visit | Attending: Interventional Radiology | Admitting: Interventional Radiology

## 2021-03-19 ENCOUNTER — Other Ambulatory Visit: Payer: Self-pay

## 2021-03-19 DIAGNOSIS — E1165 Type 2 diabetes mellitus with hyperglycemia: Secondary | ICD-10-CM | POA: Insufficient documentation

## 2021-03-19 DIAGNOSIS — Z538 Procedure and treatment not carried out for other reasons: Secondary | ICD-10-CM | POA: Diagnosis not present

## 2021-03-19 DIAGNOSIS — I771 Stricture of artery: Secondary | ICD-10-CM | POA: Diagnosis present

## 2021-03-19 DIAGNOSIS — Z7901 Long term (current) use of anticoagulants: Secondary | ICD-10-CM | POA: Diagnosis not present

## 2021-03-19 LAB — GLUCOSE, CAPILLARY: Glucose-Capillary: 420 mg/dL — ABNORMAL HIGH (ref 70–99)

## 2021-03-19 LAB — CBC
HCT: 34.2 % — ABNORMAL LOW (ref 39.0–52.0)
Hemoglobin: 11.7 g/dL — ABNORMAL LOW (ref 13.0–17.0)
MCH: 28 pg (ref 26.0–34.0)
MCHC: 34.2 g/dL (ref 30.0–36.0)
MCV: 81.8 fL (ref 80.0–100.0)
Platelets: 97 10*3/uL — ABNORMAL LOW (ref 150–400)
RBC: 4.18 MIL/uL — ABNORMAL LOW (ref 4.22–5.81)
RDW: 13.2 % (ref 11.5–15.5)
WBC: 4.6 10*3/uL (ref 4.0–10.5)
nRBC: 0 % (ref 0.0–0.2)

## 2021-03-19 LAB — BASIC METABOLIC PANEL
Anion gap: 10 (ref 5–15)
BUN: 24 mg/dL — ABNORMAL HIGH (ref 6–20)
CO2: 24 mmol/L (ref 22–32)
Calcium: 8.9 mg/dL (ref 8.9–10.3)
Chloride: 98 mmol/L (ref 98–111)
Creatinine, Ser: 1.29 mg/dL — ABNORMAL HIGH (ref 0.61–1.24)
GFR, Estimated: >60 mL/min (ref >60)
Glucose, Bld: 456 mg/dL — ABNORMAL HIGH (ref 70–99)
Potassium: 4.1 mmol/L (ref 3.5–5.1)
Sodium: 132 mmol/L — ABNORMAL LOW (ref 135–145)

## 2021-03-19 LAB — PROTIME-INR
INR: 0.9 (ref 0.8–1.2)
Prothrombin Time: 12.2 seconds (ref 11.4–15.2)

## 2021-03-19 MED ORDER — SODIUM CHLORIDE 0.9 % IV SOLN: Freq: Once | INTRAVENOUS | Status: AC

## 2021-03-19 NOTE — Progress Notes (Signed)
Paged Dr Corliss Skains to inform him of CBG 420. Returned page, draw labs and he will review before proceeding.

## 2021-03-19 NOTE — Sedation Documentation (Signed)
Called and spoke with inpatient diabetes coordinator about patient's situation. Patient reported that he doesn't have matching strips for his meter so he hasn't been taking his blood sugar and he hasn't been giving himself any insulin. Patient reports that he is taking oral medications. Patient stated even when he was taking his blood sugar and using insulin that he was still elevated.  Giving liter of fluid per Dr. Bronson Curb and will discharge patient and not proceed with procedure due to hyperglycemia. Talked with patient about the reli-on brand at [facility] that the diabetes coordinator recommended. The meter is $10 and strips are $10. Patient stated he doesn't currently have any money or income to pay the $20 but he will ask friends/family for assistance. Encouraged patient to follow up with his PCP as soon as possible. Patient verbalized understanding.

## 2021-03-19 NOTE — Progress Notes (Signed)
Procedure cancelled due to uncontrolled hyperglycemia. Long discussion with pt regarding importance of controlling blood sugars. Already also having some borderline CKD.  Will follow up with his PCP ASAP and once BS under better control, will be able to reschedule procedure.  Brayton El PA-C Interventional Radiology 03/19/2021 8:34 AM

## 2021-04-02 ENCOUNTER — Telehealth (HOSPITAL_COMMUNITY): Payer: Self-pay

## 2021-04-02 NOTE — Telephone Encounter (Signed)
Called to reschedule diagnostic angiogram, no answer, left vm> AW

## 2021-04-06 ENCOUNTER — Inpatient Hospital Stay (HOSPITAL_COMMUNITY): Payer: Medicaid - Out of State | Attending: Hematology

## 2021-04-06 DIAGNOSIS — R059 Cough, unspecified: Secondary | ICD-10-CM | POA: Diagnosis not present

## 2021-04-06 DIAGNOSIS — Z560 Unemployment, unspecified: Secondary | ICD-10-CM | POA: Insufficient documentation

## 2021-04-06 DIAGNOSIS — R110 Nausea: Secondary | ICD-10-CM | POA: Diagnosis not present

## 2021-04-06 DIAGNOSIS — I10 Essential (primary) hypertension: Secondary | ICD-10-CM | POA: Diagnosis not present

## 2021-04-06 DIAGNOSIS — R232 Flushing: Secondary | ICD-10-CM | POA: Diagnosis not present

## 2021-04-06 DIAGNOSIS — R634 Abnormal weight loss: Secondary | ICD-10-CM | POA: Insufficient documentation

## 2021-04-06 DIAGNOSIS — D696 Thrombocytopenia, unspecified: Secondary | ICD-10-CM

## 2021-04-06 DIAGNOSIS — Z5982 Transportation insecurity: Secondary | ICD-10-CM | POA: Diagnosis not present

## 2021-04-06 DIAGNOSIS — M2550 Pain in unspecified joint: Secondary | ICD-10-CM | POA: Insufficient documentation

## 2021-04-06 DIAGNOSIS — R519 Headache, unspecified: Secondary | ICD-10-CM | POA: Diagnosis not present

## 2021-04-06 DIAGNOSIS — R161 Splenomegaly, not elsewhere classified: Secondary | ICD-10-CM | POA: Diagnosis present

## 2021-04-06 DIAGNOSIS — Z8616 Personal history of COVID-19: Secondary | ICD-10-CM | POA: Diagnosis not present

## 2021-04-06 DIAGNOSIS — D649 Anemia, unspecified: Secondary | ICD-10-CM | POA: Insufficient documentation

## 2021-04-06 DIAGNOSIS — Z79899 Other long term (current) drug therapy: Secondary | ICD-10-CM | POA: Insufficient documentation

## 2021-04-06 LAB — CBC WITH DIFFERENTIAL/PLATELET
Abs Immature Granulocytes: 0.02 10*3/uL (ref 0.00–0.07)
Basophils Absolute: 0 10*3/uL (ref 0.0–0.1)
Basophils Relative: 1 %
Eosinophils Absolute: 0.1 10*3/uL (ref 0.0–0.5)
Eosinophils Relative: 1 %
HCT: 36.2 % — ABNORMAL LOW (ref 39.0–52.0)
Hemoglobin: 12.1 g/dL — ABNORMAL LOW (ref 13.0–17.0)
Immature Granulocytes: 0 %
Lymphocytes Relative: 27 %
Lymphs Abs: 1.4 10*3/uL (ref 0.7–4.0)
MCH: 28.1 pg (ref 26.0–34.0)
MCHC: 33.4 g/dL (ref 30.0–36.0)
MCV: 84.2 fL (ref 80.0–100.0)
Monocytes Absolute: 0.3 10*3/uL (ref 0.1–1.0)
Monocytes Relative: 5 %
Neutro Abs: 3.5 10*3/uL (ref 1.7–7.7)
Neutrophils Relative %: 66 %
Platelets: 104 10*3/uL — ABNORMAL LOW (ref 150–400)
RBC: 4.3 MIL/uL (ref 4.22–5.81)
RDW: 13.3 % (ref 11.5–15.5)
WBC Morphology: REACTIVE
WBC: 5.3 10*3/uL (ref 4.0–10.5)
nRBC: 0 % (ref 0.0–0.2)

## 2021-04-06 LAB — LACTATE DEHYDROGENASE: LDH: 111 U/L (ref 98–192)

## 2021-04-13 ENCOUNTER — Other Ambulatory Visit: Payer: Self-pay

## 2021-04-13 ENCOUNTER — Inpatient Hospital Stay (HOSPITAL_BASED_OUTPATIENT_CLINIC_OR_DEPARTMENT_OTHER): Payer: Medicaid - Out of State | Admitting: Hematology

## 2021-04-13 VITALS — BP 107/74 | HR 99 | Temp 98.1°F | Resp 18 | Ht 68.0 in | Wt 166.7 lb

## 2021-04-13 DIAGNOSIS — D696 Thrombocytopenia, unspecified: Secondary | ICD-10-CM | POA: Diagnosis not present

## 2021-04-13 DIAGNOSIS — R161 Splenomegaly, not elsewhere classified: Secondary | ICD-10-CM

## 2021-04-13 NOTE — Progress Notes (Signed)
Jose Koch, Jose Koch   CLINIC:  Medical Oncology/Hematology  PCP:  Minna Antis, DO [address] / [place] [address]  [phone]  REASON FOR VISIT:  Follow-up for thrombocytopenia and splenomegaly  PRIOR THERAPY: none  CURRENT THERAPY: surveillance  INTERVAL HISTORY:  Jose Koch, a 51 y.o. male, returns for routine follow-up for his thrombocytopenia and splenomegaly. Jose Koch was last seen on 12/08/2020.  Today he reports feeling good. He denies recent falls, recent infections, fevers, and night sweats. His weight is stable.   REVIEW OF SYSTEMS:  Review of Systems  Constitutional:  Negative for appetite change, fatigue, fever and unexpected weight change.  Respiratory:  Positive for cough.   Gastrointestinal:  Positive for nausea.  Endocrine: Negative for hot flashes.  Musculoskeletal:  Positive for arthralgias (5/10 shoulder and hip).  Neurological:  Positive for headaches.  All other systems reviewed and are negative.  PAST MEDICAL/SURGICAL HISTORY:  Past Medical History:  Diagnosis Date   COVID    Diabetes mellitus without complication (HCC)    High cholesterol    Hypertension    Ichthyosis vulgaris    Past Surgical History:  Procedure Laterality Date   IR RADIOLOGIST EVAL & MGMT  03/05/2021   TONSILLECTOMY     wisdom teeth removal      SOCIAL HISTORY:  Social History   Socioeconomic History   Marital status: Single    Spouse name: Not on file   Number of children: Not on file   Years of education: Not on file   Highest education level: Not on file  Occupational History   Occupation: unemployed  Tobacco Use   Smoking status: Never   Smokeless tobacco: Never  Vaping Use   Vaping Use: Never used  Substance and Sexual Activity   Alcohol use: Not Currently   Drug use: Never   Sexual activity: Not on file  Other Topics Concern   Not on file  Social History Narrative   Right handed     Social Determinants of Health   Financial Resource Strain: Low Risk    Difficulty of Paying Living Expenses: Not hard at all  Food Insecurity: No Food Insecurity   Worried About Charity fundraiser in the Last Year: Never true   Arboriculturist in the Last Year: Never true  Transportation Needs: Unmet Transportation Needs   Lack of Transportation (Medical): Yes   Lack of Transportation (Non-Medical): Yes  Physical Activity: Sufficiently Active   Days of Exercise per Week: 7 days   Minutes of Exercise per Session: 30 min  Stress: Stress Concern Present   Feeling of Stress : Very much  Social Connections: Socially Isolated   Frequency of Communication with Friends and Family: More than three times a week   Frequency of Social Gatherings with Friends and Family: More than three times a week   Attends Religious Services: Never   Marine scientist or Organizations: No   Attends Music therapist: Never   Marital Status: Divorced  Human resources officer Violence: Not At Risk   Fear of Current or Ex-Partner: No   Emotionally Abused: No   Physically Abused: No   Sexually Abused: No    FAMILY HISTORY:  Family History  Family history unknown: Yes    CURRENT MEDICATIONS:  Current Outpatient Medications  Medication Sig Dispense Refill   Ascorbic Acid (VITAMIN C PO) Take 1 tablet by mouth daily.  blood glucose meter kit and supplies KIT Dispense based on patient and insurance preference (something with affordable test strips). Use to check glucose twice daily as directed. 1 each 0   glucose blood (ACCU-CHEK GUIDE) test strip Use as instructed to monitor glucose twice daily, before breakfast and before bed 100 each 12   Insulin Glargine (BASAGLAR KWIKPEN) 100 UNIT/ML Inject 20 Units into the skin every evening.     Insulin Pen Needle 31G X 5 MM MISC 1 Units by Does not apply route daily. 100 each 1   Menthol, Topical Analgesic, (ICY HOT EX) Apply 1 application topically  daily as needed (pain).     metFORMIN (GLUCOPHAGE XR) 500 MG 24 hr tablet Take 1 tablet (500 mg total) by mouth daily with breakfast. 90 tablet 0   OneTouch Delica Lancets 15B MISC PLEASE SEE ATTACHED FOR DETAILED DIRECTIONS     pantoprazole (PROTONIX) 40 MG tablet TAKE 1 TABLET BY MOUTH 2 TIMES DAILY BEFORE A MEAL 30 MINUTES BEFORE BREAKFAST AND DINNER (Patient taking differently: Take 40 mg by mouth daily.) 60 tablet 3   metoCLOPramide (REGLAN) 10 MG tablet Take 1 tablet (10 mg total) by mouth 3 (three) times daily with meals. 90 tablet 1   No current facility-administered medications for this visit.    ALLERGIES:  Allergies  Allergen Reactions   Other     Marijuana - headaches and dizziness     PHYSICAL EXAM:  Performance status (ECOG): 0 - Asymptomatic  Vitals:   04/13/21 1457  BP: 107/74  Pulse: 99  Resp: 18  Temp: 98.1 F (36.7 C)  SpO2: 98%   Wt Readings from Last 3 Encounters:  04/13/21 166 lb 11.2 oz (75.6 kg)  03/19/21 161 lb (73 kg)  12/26/20 166 lb 6.4 oz (75.5 kg)   Physical Exam Vitals reviewed.  Constitutional:      Appearance: Normal appearance.  Cardiovascular:     Rate and Rhythm: Normal rate and regular rhythm.     Pulses: Normal pulses.     Heart sounds: Normal heart sounds.  Pulmonary:     Effort: Pulmonary effort is normal.     Breath sounds: Normal breath sounds.  Abdominal:     Palpations: Abdomen is soft. There is splenomegaly (spleen tip palpable on deep inspiration). There is no hepatomegaly or mass.     Tenderness: There is no abdominal tenderness.  Lymphadenopathy:     Upper Body:     Right upper body: No supraclavicular, axillary or pectoral adenopathy.     Left upper body: No supraclavicular, axillary or pectoral adenopathy.  Neurological:     General: No focal deficit present.     Mental Status: He is alert and oriented to person, place, and time.  Psychiatric:        Mood and Affect: Mood normal.        Behavior: Behavior  normal.    LABORATORY DATA:  I have reviewed the labs as listed.  CBC Latest Ref Rng & Units 04/06/2021 03/19/2021 10/24/2020  WBC 4.0 - 10.5 K/uL 5.3 4.6 5.1  Hemoglobin 13.0 - 17.0 g/dL 12.1(L) 11.7(L) 11.6(L)  Hematocrit 39.0 - 52.0 % 36.2(L) 34.2(L) 34.7(L)  Platelets 150 - 400 K/uL 104(L) 97(L) 95(L)   CMP Latest Ref Rng & Units 03/19/2021 02/03/2021 12/02/2020  Glucose 70 - 99 mg/dL 456(H) - -  BUN 6 - 20 mg/dL 24(H) - -  Creatinine 0.61 - 1.24 mg/dL 1.29(H) 1.70(H) 1.60(H)  Sodium 135 - 145 mmol/L 132(L) - -  Potassium 3.5 - 5.1 mmol/L 4.1 - -  Chloride 98 - 111 mmol/L 98 - -  CO2 22 - 32 mmol/L 24 - -  Calcium 8.9 - 10.3 mg/dL 8.9 - -  Total Protein 6.1 - 8.1 g/dL - - -  Total Bilirubin 0.2 - 1.2 mg/dL - - -  Alkaline Phos 38 - 126 U/L - - -  AST 10 - 40 U/L - - -  ALT 9 - 46 U/L - - -      Component Value Date/Time   RBC 4.30 04/06/2021 1338   MCV 84.2 04/06/2021 1338   MCH 28.1 04/06/2021 1338   MCHC 33.4 04/06/2021 1338   RDW 13.3 04/06/2021 1338   LYMPHSABS 1.4 04/06/2021 1338   MONOABS 0.3 04/06/2021 1338   EOSABS 0.1 04/06/2021 1338   BASOSABS 0.0 04/06/2021 1338    DIAGNOSTIC IMAGING:  I have independently reviewed the scans and discussed with the patient. No results found.   ASSESSMENT:  1.  Splenomegaly with multiple lesions: -Recent hospitalization with nausea and vomiting. -CTAP on 04/05/2020 showed enlarged spleen measuring 15 cm with multiple hypodense soft tissue density lesions measuring 2.3 cm, 2.7 cm.  No significant adenopathy.  Liver is diffusely hypodense consistent with fatty infiltration. -He had weight loss during recent hospitalization and is gaining it back. -No recurrent infections. -PET scan on 06/30/2020 images were reviewed with him. - Mild hypermetabolic small bilateral cervical lymph nodes, indeterminate.  Clinically not palpable. - No splenic hypermetabolism.  Spleen size slightly improved to 2 fingerbreadths below the left costal  margin on inspiration. - EBV serology consistent with previous infection.  CMV was negative.  Other nutritional deficiency work-up for thrombocytopenia was negative. - Methylmalonic acid was slightly elevated.  Recommend vitamin B12 1 mg tablet daily.   2.  Social/family history: -He takes care of his girlfriend.  He worked previously as a Programmer, applications.  He was never smoker.  Rarely drinks alcohol. -Mother was adopted.  Does not know his father.   PLAN:  1.  Splenomegaly with multiple lesions: - CTAP from 12/02/2020 showed stable splenomegaly with no significant change in multiple splenic hypodensities.  No abdominal pelvic adenopathy. - He does not have any fevers, night sweats or weight loss.  No falls since last visit. - No infections in the last 6 months. - Physical examination today shows spleen tip is palpable on deep inspiration. - CBC reveals normal white count and mild anemia with hemoglobin 12.1.  LDH is normal. - RTC 6 months with repeat labs.  Will do anemia panel at that time.   2.  Mild thrombocytopenia: - He has mild to moderate thrombocytopenia since 2017 with platelet count ranging between 56-1 25, most likely from splenomegaly. - Latest CBC on 04/06/2021 shows platelet count 104.  No bleeding issues reported.  Orders placed this encounter:  No orders of the defined types were placed in this encounter.    Derek Jack, MD [place] [phone]   I, Thana Ates, am acting as a scribe for Dr. Derek Jack.  I, Derek Jack MD, have reviewed the above documentation for accuracy and completeness, and I agree with the above.

## 2021-04-13 NOTE — Patient Instructions (Addendum)
[facility] at [facility] °Discharge Instructions ° °You were seen and examined today by Dr. Katragadda. He reviewed your most recent labs and everything looks good. Please keep follow up appointment as scheduled in 6 months.  ° ° °Thank you for choosing [facility] at [facility] to provide your oncology and hematology care.  To afford each patient quality time with our provider, please arrive at least 15 minutes before your scheduled appointment time.  ° °If you have a lab appointment with the Cancer Center please come in thru the Main Entrance and check in at the main information desk. ° °You need to re-schedule your appointment should you arrive 10 or more minutes late.  We strive to give you quality time with our providers, and arriving late affects you and other patients whose appointments are after yours.  Also, if you no show three or more times for appointments you may be dismissed from the clinic at the providers discretion.     °Again, thank you for choosing [facility].  Our hope is that these requests will decrease the amount of time that you wait before being seen by our physicians.       °_____________________________________________________________ ° °Should you have questions after your visit to [facility], please contact our office at [phone] and follow the prompts.  Our office hours are 8:00 a.m. and 4:30 p.m. Monday - Friday.  Please note that voicemails left after 4:00 p.m. may not be returned until the following business day.  We are closed weekends and major holidays.  You do have access to a nurse 24-7, just call the main number to the clinic [phone] and do not press any options, hold on the line and a nurse will answer the phone.   ° °For prescription refill requests, have your pharmacy contact our office and allow 72 hours.   ° °Due to Covid, you will need to wear a mask upon entering the hospital. If you do  not have a mask, a mask will be given to you at the Main Entrance upon arrival. For doctor visits, patients may have 1 support person age 18 or older with them. For treatment visits, patients can not have anyone with them due to social distancing guidelines and our immunocompromised population.  ° °  °

## 2021-04-15 ENCOUNTER — Other Ambulatory Visit: Payer: Self-pay | Admitting: Radiology

## 2021-04-17 ENCOUNTER — Other Ambulatory Visit: Payer: Self-pay

## 2021-04-17 ENCOUNTER — Other Ambulatory Visit (HOSPITAL_COMMUNITY): Payer: Self-pay | Admitting: Interventional Radiology

## 2021-04-17 ENCOUNTER — Ambulatory Visit (HOSPITAL_COMMUNITY)
Admission: RE | Admit: 2021-04-17 | Discharge: 2021-04-17 | Disposition: A | Discharge status: Home / Self Care | Payer: Medicaid - Out of State | Source: Ambulatory Visit | Attending: Interventional Radiology | Admitting: Interventional Radiology

## 2021-04-17 DIAGNOSIS — Z7984 Long term (current) use of oral hypoglycemic drugs: Secondary | ICD-10-CM | POA: Diagnosis not present

## 2021-04-17 DIAGNOSIS — E785 Hyperlipidemia, unspecified: Secondary | ICD-10-CM | POA: Insufficient documentation

## 2021-04-17 DIAGNOSIS — I10 Essential (primary) hypertension: Secondary | ICD-10-CM | POA: Diagnosis not present

## 2021-04-17 DIAGNOSIS — E119 Type 2 diabetes mellitus without complications: Secondary | ICD-10-CM | POA: Diagnosis not present

## 2021-04-17 DIAGNOSIS — I6523 Occlusion and stenosis of bilateral carotid arteries: Secondary | ICD-10-CM | POA: Diagnosis present

## 2021-04-17 DIAGNOSIS — G40909 Epilepsy, unspecified, not intractable, without status epilepticus: Secondary | ICD-10-CM | POA: Insufficient documentation

## 2021-04-17 DIAGNOSIS — I771 Stricture of artery: Secondary | ICD-10-CM

## 2021-04-17 DIAGNOSIS — Z8616 Personal history of COVID-19: Secondary | ICD-10-CM | POA: Diagnosis not present

## 2021-04-17 DIAGNOSIS — Z794 Long term (current) use of insulin: Secondary | ICD-10-CM | POA: Insufficient documentation

## 2021-04-17 HISTORY — PX: IR ANGIO VERTEBRAL SEL VERTEBRAL BILAT MOD SED: IMG5369

## 2021-04-17 HISTORY — PX: IR ANGIO INTRA EXTRACRAN SEL COM CAROTID INNOMINATE BILAT MOD SED: IMG5360

## 2021-04-17 HISTORY — PX: IR US GUIDE VASC ACCESS RIGHT: IMG2390

## 2021-04-17 LAB — BASIC METABOLIC PANEL
Anion gap: 7 (ref 5–15)
BUN: 18 mg/dL (ref 6–20)
CO2: 25 mmol/L (ref 22–32)
Calcium: 8.3 mg/dL — ABNORMAL LOW (ref 8.9–10.3)
Chloride: 104 mmol/L (ref 98–111)
Creatinine, Ser: 1.37 mg/dL — ABNORMAL HIGH (ref 0.61–1.24)
GFR, Estimated: >60 mL/min (ref >60)
Glucose, Bld: 257 mg/dL — ABNORMAL HIGH (ref 70–99)
Potassium: 4 mmol/L (ref 3.5–5.1)
Sodium: 136 mmol/L (ref 135–145)

## 2021-04-17 LAB — CBC
HCT: 31.5 % — ABNORMAL LOW (ref 39.0–52.0)
Hemoglobin: 10.3 g/dL — ABNORMAL LOW (ref 13.0–17.0)
MCH: 27.2 pg (ref 26.0–34.0)
MCHC: 32.7 g/dL (ref 30.0–36.0)
MCV: 83.3 fL (ref 80.0–100.0)
Platelets: 97 10*3/uL — ABNORMAL LOW (ref 150–400)
RBC: 3.78 MIL/uL — ABNORMAL LOW (ref 4.22–5.81)
RDW: 13.1 % (ref 11.5–15.5)
WBC: 4.5 10*3/uL (ref 4.0–10.5)
nRBC: 0 % (ref 0.0–0.2)

## 2021-04-17 LAB — GLUCOSE, CAPILLARY
Glucose-Capillary: 233 mg/dL — ABNORMAL HIGH (ref 70–99)
Glucose-Capillary: 215 mg/dL — ABNORMAL HIGH (ref 70–99)

## 2021-04-17 LAB — PROTIME-INR
INR: 0.8 (ref 0.8–1.2)
Prothrombin Time: 11.6 seconds (ref 11.4–15.2)

## 2021-04-17 IMAGING — XA IR ANGIO INTRA EXTRACRAN SEL COM CAROTID INNOMINATE BILAT MOD SE
10 of 13 series · 11 of 24 positions shown · IV contrast (IODINE)
Comparison: Recent CT angiogram of the head and neck [DATE], and MRA MRI of the brain [DATE].

CLINICAL DATA: Episodes of loss of awareness. Severe right internal
carotid artery stenosis intracranially on recent CT angiogram of the
head and neck.

EXAM:
BILATERAL COMMON CAROTID AND INNOMINATE ANGIOGRAPHY
TECHNIQUE: Informed written consent was obtained from the patient after a
thorough discussion of the procedural risks, benefits and
alternatives. All questions were addressed. Maximal Sterile Barrier
Technique was utilized including caps, mask, sterile gowns, sterile
gloves, sterile drape, hand hygiene and skin antiseptic. A timeout
was performed prior to the initiation of the procedure.

[Series 1: cerebral · 1 of 9 frames shown (1 of 9)]
[frame 5/9]
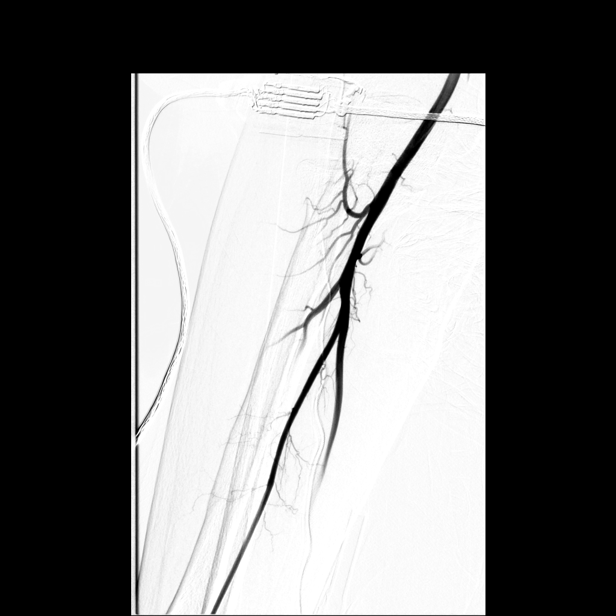

[Series 2: cerebral · 2 acquisitions, 1 frame shown (2 of 9)]
[im 1/2]
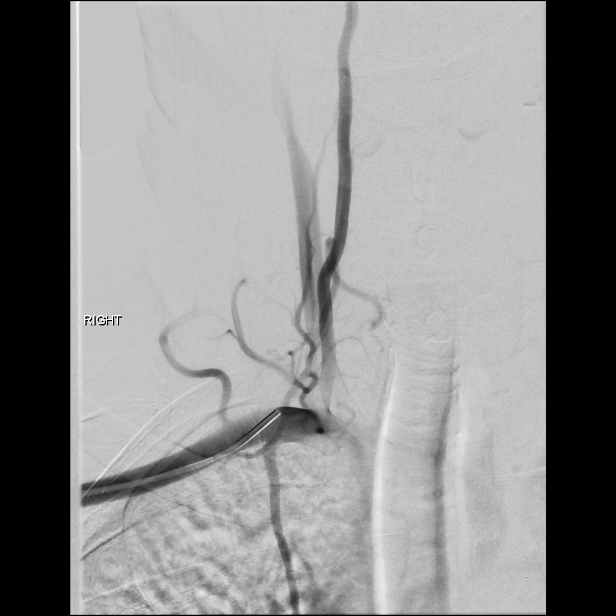

[Series 4: cerebral · 2 acquisitions, 1 frame shown (3 of 9)]
[im 1/2]
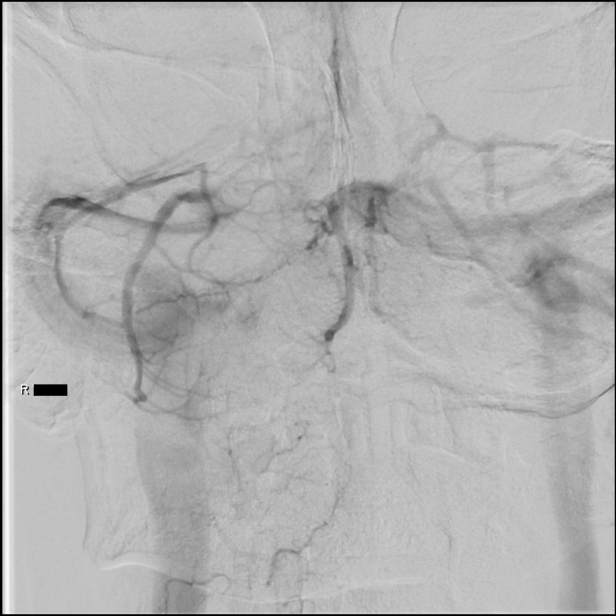

[Series 5: cerebral · 2 acquisitions, 1 frame shown (4 of 9)]
[im 1/2]
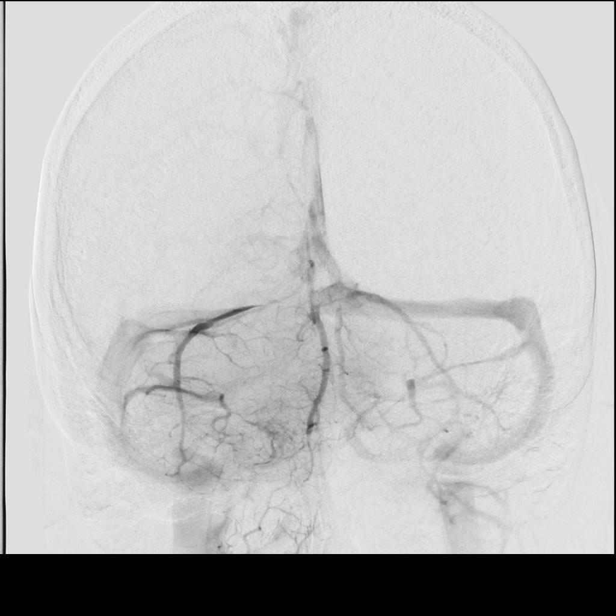

[Series 7: cerebral · 2 acquisitions, 1 frame shown (5 of 9)]
[im 1/2]
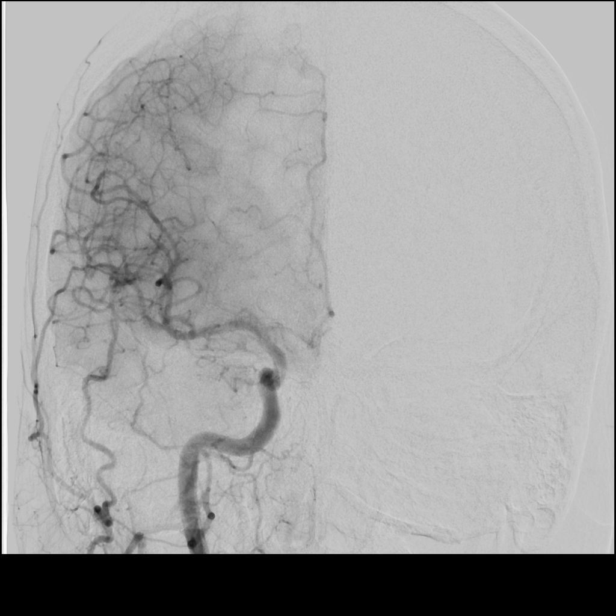

[Series 9: cerebral · 2 acquisitions, 1 frame shown (6 of 9)]
[im 1/2]
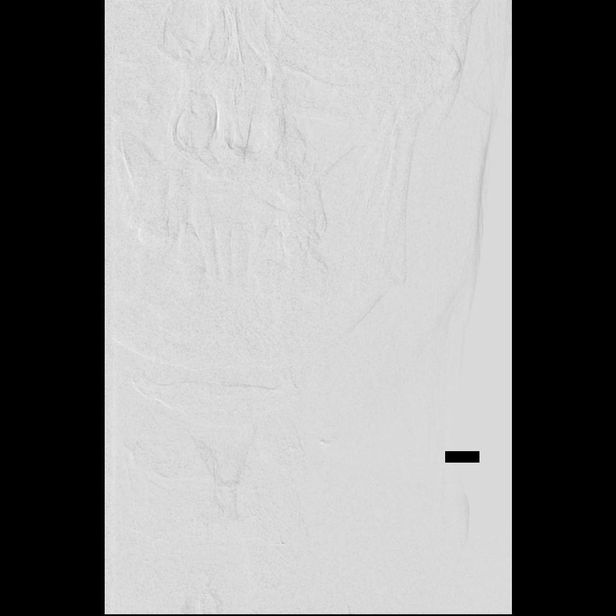

[Series 10: cerebral · 2 acquisitions, 1 frame shown (7 of 9)]
[im 1/2]
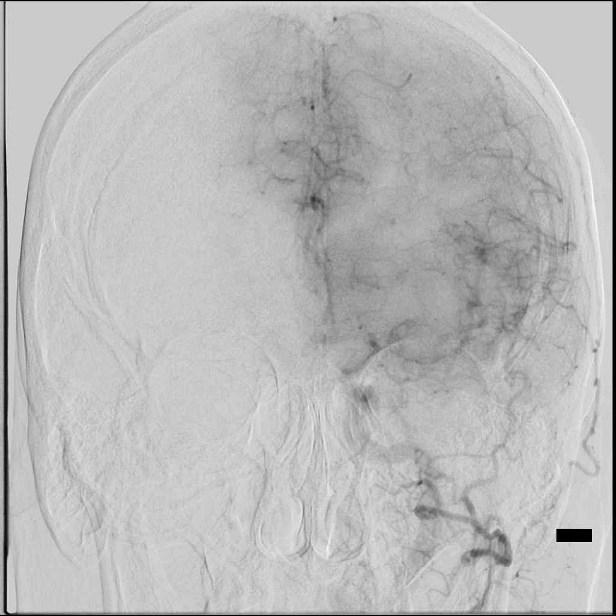

[Series 11: cerebral · 2 acquisitions, 1 frame shown (8 of 9)]
[im 1/2]
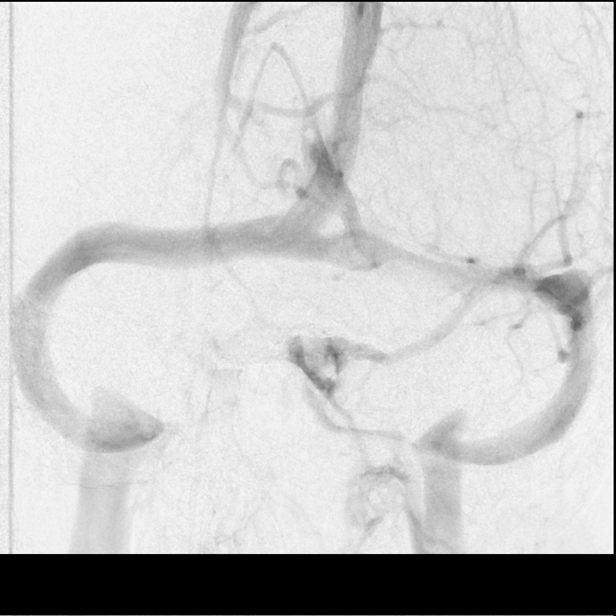

[Series 12: cerebral · 2 acquisitions, 1 frame shown (9 of 9)]
[im 1/2]
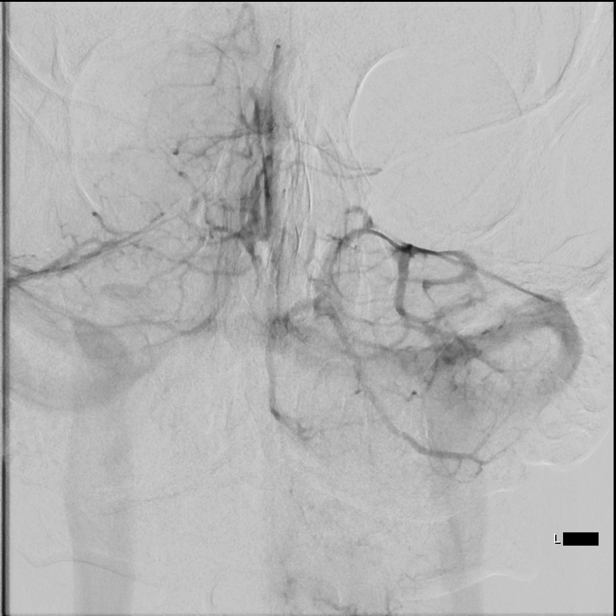

[Series 300: dr. (person_name). · 2 of 167 slices shown]
[im 61/167]
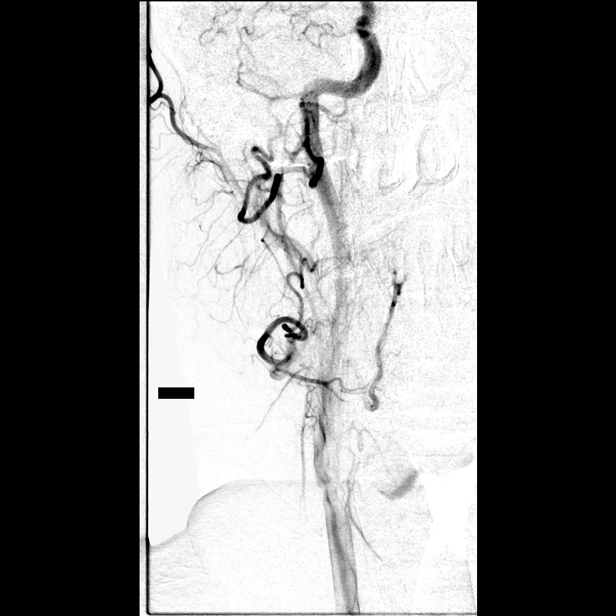
[im 136/167]
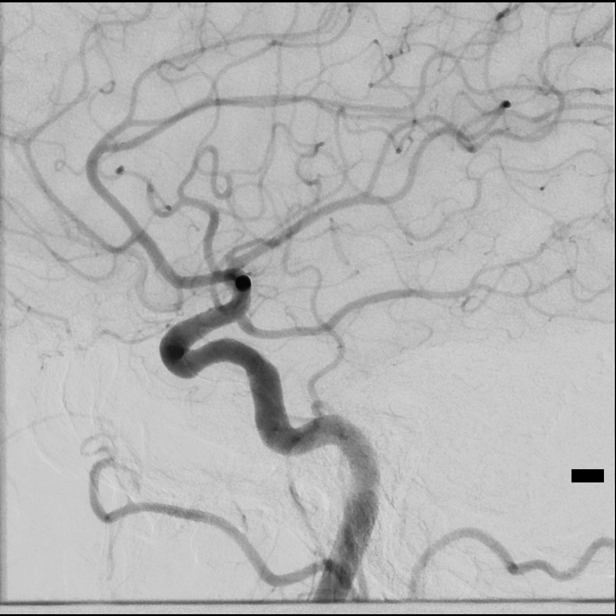

[11 of 24 positions shown; findings below may reference images not displayed]

MEDICATIONS:
Heparin [T8] units IV. The antibiotic was administered within 1 hour
of the procedure.

ANESTHESIA/SEDATION:
Versed 1 mg IV; Fentanyl 25 mcg IV

Moderate Sedation Time:  45 minutes

The patient was continuously monitored during the procedure by the
interventional radiology nurse under my direct supervision.

CONTRAST:  Omnipaque 300 approximately 65 mL.

FLUOROSCOPY TIME:  Fluoroscopy Time: 16 minutes 3 seconds (746 mGy).

COMPLICATIONS:
None immediate.
The right forearm to the wrist was prepped and draped in the usual
sterile manner.

The right radial artery was identified with ultrasound and its
morphology documented permanently in the radiology PACS system. A
dorsal palmar anastomosis was verified to be present. Using
ultrasound guidance access into the right radial artery was obtained
over a 0.018 inch wire with a [DATE] French radial sheath. The
obturator, and the micro guidewire were removed. Good aspiration
obtained from the side-port of the radial sheath. A cocktail of 200
mcg of nitroglycerin, [T8] units of heparin, and 2.5 mg of verapamil
was then infused in diluted form without event.

Right radial arteriogram was performed.

Over a 0.035 inch Roadrunner guidewire, a 5 AMAZIGH 2
diagnostic catheter was advanced to the aortic arch region, and
selectively positioned in the right vertebral artery, the left
vertebral artery, the left common carotid artery and the right
common carotid artery.

A wrist band was applied for hemostasis at the right radial puncture
site. Distal right radial pulse was verified to be present.
FINDINGS: The right vertebral artery origin is widely patent.

The vessel is seen to opacify to the cranial skull base. Wide
patency is seen of the right vertebrobasilar junction and the right
posterior-inferior cerebellar artery.

An approximately 2.7 mm x 2.3 mm saccular aneurysm is seen involving
the right vertebrobasilar junction proximal to the right
posterior-inferior cerebellar artery.

Opacified portions of the basilar artery, the posterior cerebral
arteries, the superior cerebellar arteries and the anterior-inferior
cerebellar arteries demonstrate opacification into the capillary and
venous phases. Unopacified blood is seen in the basilar artery from
the contralateral vertebral artery.

Also demonstrated is transient opacification via the right posterior
communicating artery of the distal supraclinoid right ICA.

The right common carotid arteriogram demonstrates approximately 50%
stenosis of the right internal carotid artery at the bulb. More
distally, the vessel is seen to opacify to the cranial skull base.

Wide patency is seen of the ICA petrous segment. An approximately
60-70% stenosis is seen of the proximal cavernous right ICA
associated with an outpouching projecting inferiorly measuring
approximately 3 mm x 3 mm.

Distal to this the caval cavernous, and the supraclinoid segments
are widely patent.

The right middle cerebral artery and the right anterior cerebral
artery opacify into the capillary and venous phases. The right
external carotid artery and its major branches demonstrate wide
patency.

Left common carotid arteriogram demonstrates mild stenosis at the
origin of the left external carotid artery. Its branches opacify
widely.

The left internal carotid artery demonstrates a broad-based smooth
plaque along the posterior wall associated with approximately 20-25%
stenosis by the NASCET criteria. No intraluminal filling defects or
of ulcerations is evident. More distally, the vessel is seen to
opacify to the cranial skull base.

The petrous, the cavernous and the supraclinoid segments are widely
patent.

Left posterior communicating artery is seen opacifying the left
posterior cerebral artery distribution.

The left middle cerebral artery and the left anterior cerebral
artery opacify into the capillary and venous phases.

Cross-filling transiently of the right anterior cerebral A2 segment
is seen via the anterior communicating artery. Left vertebral artery
origin is widely patent.

The vessel is seen to opacify to the cranial skull base. There is
approximately 30% stenosis of the left vertebrobasilar junction
proximal to the left posterior-inferior cerebellar artery.

Distal to this the vertebrobasilar junction, and the opacified
portion of the basilar artery, the right posterior cerebral artery,
the superior cerebellar arteries and the anterior-inferior
cerebellar arteries is seen into the capillary and venous phases.

Nonvisualization of the left posterior cerebral artery is due to
inflow of unopacified blood of the left posterior-inferior
cerebellar artery.

Again seen is the prominent opacification via the right posterior
communicating artery of the right internal carotid artery
supraclinoid segment, and subsequently the right middle cerebral
artery distribution partially.
IMPRESSION: Approximately 60 to 70% stenosis of the proximal cavernous right ICA
associated with a 3 mm x 3 mm focal outpouching probably
representing a pseudo aneurysm.

Approximately 2.7 mm x 2.3 mm aneurysm of the right vertebrobasilar
junction just proximal to the right posterior-inferior cerebellar
artery.

Approximately 50% stenosis of the proximal right internal carotid
artery.

Approximately 20-25% stenosis of the left internal carotid artery
proximally.

PLAN:
Findings reviewed with the patient and the mother in law. Patient
advised to return in follow-up for consultation regarding management
of the above angiographic findings.

## 2021-04-17 MED ORDER — NITROGLYCERIN 1 MG/10 ML FOR IR/CATH LAB
INTRA_ARTERIAL | Status: AC
  Filled 2021-04-17: qty 10

## 2021-04-17 MED ORDER — HEPARIN SODIUM (PORCINE) 1000 UNIT/ML IJ SOLN
INTRAMUSCULAR | Status: AC
  Filled 2021-04-17: qty 10

## 2021-04-17 MED ORDER — FENTANYL CITRATE (PF) 100 MCG/2ML IJ SOLN
INTRAMUSCULAR | Status: AC | PRN
  Administered 2021-04-17: 25 ug via INTRAVENOUS

## 2021-04-17 MED ORDER — VERAPAMIL HCL 2.5 MG/ML IV SOLN: INTRA_ARTERIAL | Status: AC | PRN

## 2021-04-17 MED ORDER — MIDAZOLAM HCL 2 MG/2ML IJ SOLN
INTRAMUSCULAR | Status: AC | PRN
  Administered 2021-04-17: 1 mg via INTRAVENOUS

## 2021-04-17 MED ORDER — SODIUM CHLORIDE 0.9 % IV SOLN: Freq: Once | INTRAVENOUS | Status: AC

## 2021-04-17 MED ORDER — SODIUM CHLORIDE (PF) 0.9 % IJ SOLN
INTRAVENOUS | Status: AC | PRN
  Administered 2021-04-17: 200 ug via INTRA_ARTERIAL

## 2021-04-17 MED ORDER — FENTANYL CITRATE (PF) 100 MCG/2ML IJ SOLN
INTRAMUSCULAR | Status: AC
  Filled 2021-04-17: qty 2

## 2021-04-17 MED ORDER — SODIUM CHLORIDE 0.9 % IV BOLUS
INTRAVENOUS | Status: AC | PRN
  Administered 2021-04-17: 250 mL via INTRAVENOUS

## 2021-04-17 MED ORDER — IOHEXOL 300 MG/ML  SOLN
100.0000 mL | Freq: Once | INTRAMUSCULAR | Status: AC | PRN
  Administered 2021-04-17: 90 mL via INTRA_ARTERIAL

## 2021-04-17 MED ORDER — VERAPAMIL HCL 2.5 MG/ML IV SOLN
INTRAVENOUS | Status: AC
  Filled 2021-04-17: qty 2

## 2021-04-17 MED ORDER — LIDOCAINE HCL 1 % IJ SOLN
INTRAMUSCULAR | Status: AC
  Filled 2021-04-17: qty 20

## 2021-04-17 MED ORDER — SODIUM CHLORIDE 0.9 % IV SOLN: INTRAVENOUS | Status: AC

## 2021-04-17 MED ORDER — MIDAZOLAM HCL 2 MG/2ML IJ SOLN
INTRAMUSCULAR | Status: AC
  Filled 2021-04-17: qty 2

## 2021-04-17 NOTE — Sedation Documentation (Signed)
Pt transported to short stay room 3 via bed accompanied by RN. Anisha RN at bedside to receive pt. Handoff complete. Right wrist remains level 0 with TR band in place. Pt awake alert and oriented. No s/s of distress at this time.

## 2021-04-17 NOTE — Procedures (Signed)
INR. 4Vessel cerebral arteriogram RT rad approach. Findings. 1.Approx 50% stenosis prox RT ICA.. 2.60 to 70 %stenosis RT ICA cav seg associated wit  a 20mm x 102mm saccular outpouching. 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  S.Natalio Salois MD

## 2021-04-17 NOTE — H&P (Signed)
Chief Complaint: Patient was seen in consultation today for R ICA stenosis and referred by Dr. Metta Clines.  Supervising Physician: Luanne Bras  Patient Status: Jose Koch - Out-pt  History of Present Illness: Jose Koch is a 51 y.o. male with pmh or thrompbocytopenia, splenomegaly, diabetes, temporal lobe epilepsy, HTN, and HLD.  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  He presents after consultation with Dr. Estanislado Pandy for diagnostic arteriogram.  He is feeling sore today from doing some lifting at home but otherwise feels well.  He denies SOB, pain, change in vision, headache, N/V.  Past Medical History:  Diagnosis Date   COVID    Diabetes mellitus without complication (HCC)    High cholesterol    Hypertension    Ichthyosis vulgaris     Past Surgical History:  Procedure Laterality Date   IR RADIOLOGIST EVAL & MGMT  03/05/2021   TONSILLECTOMY     wisdom teeth removal      Allergies: Other  Medications: Prior to Admission medications   Medication Sig Start Date End Date Taking? Authorizing Provider  Ascorbic Acid (VITAMIN C PO) Take 1 tablet by mouth daily.   Yes [provider]  Insulin Glargine (BASAGLAR KWIKPEN) 100 UNIT/ML Inject 20 Units into the skin at bedtime.   Yes [provider]  Menthol, Topical Analgesic, (ICY HOT EX) Apply 1 application topically daily as needed (pain).   Yes [provider]  metFORMIN (GLUCOPHAGE XR) 500 MG 24 hr tablet Take 1 tablet (500 mg total) by mouth daily with breakfast. 05/22/20  Yes Brita Romp, NP  metoCLOPramide (REGLAN) 10 MG tablet Take 1 tablet (10 mg total) by mouth 3 (three) times daily with meals. 04/07/20 04/17/21 Yes Shah, Pratik D, DO  pantoprazole (PROTONIX) 40 MG tablet TAKE 1 TABLET BY MOUTH 2 TIMES DAILY BEFORE A MEAL 30 MINUTES BEFORE BREAKFAST AND DINNER Patient taking differently: Take 40 mg by mouth daily. 10/16/20  Yes Mahala Menghini, PA-C  vitamin B-12 (CYANOCOBALAMIN) 1000 MCG tablet Take 1,000 mcg by mouth daily.   Yes [provider]  blood glucose meter kit and supplies KIT Dispense based on patient and insurance preference (something with affordable test strips). Use to check glucose twice daily as directed. 08/18/20   Brita Romp, NP  glucose blood (ACCU-CHEK GUIDE) test strip Use as instructed to monitor glucose twice daily, before breakfast and before bed 05/13/20   Brita Romp, NP  Insulin Pen Needle 31G X 5 MM MISC 1 Units by Does not apply route daily. 04/07/20   Manuella Ghazi, Pratik D, DO  OneTouch Delica Lancets 54M MISC PLEASE SEE ATTACHED FOR DETAILED DIRECTIONS 04/08/20   [provider]     Family History  Family history unknown: Yes    Social History   Socioeconomic History   Marital status: Single    Spouse name: Not on file   Number of children: Not on file   Years of education: Not on file   Highest education level: Not on file  Occupational History   Occupation: unemployed  Tobacco Use   Smoking status: Never   Smokeless tobacco: Never  Vaping Use   Vaping Use: Never used  Substance and Sexual Activity   Alcohol use: Not Currently   Drug use: Never   Sexual activity: Not on file  Other Topics Concern   Not on file  Social History Narrative   Right handed    Social Determinants  of Health   Financial Resource Strain: Low Risk    Difficulty of Paying Living Expenses: Not hard at all  Food Insecurity: No Food Insecurity   Worried About Charity fundraiser in the Last Year: Never true   [place] in the Last Year: Never true  Transportation Needs: Unmet Transportation Needs   Lack of Transportation (Medical): Yes   Lack of Transportation (Non-Medical): Yes  Physical Activity: Sufficiently Active   Days of Exercise per Week: 7 days   Minutes of Exercise per Session: 30 min  Stress: Stress Concern Present   Feeling of Stress : Very much  Social  Connections: Socially Isolated   Frequency of Communication with Friends and Family: More than three times a week   Frequency of Social Gatherings with Friends and Family: More than three times a week   Attends Religious Services: Never   Marine scientist or Organizations: No   Attends Archivist Meetings: Never   Marital Status: Divorced    Review of Systems: A 12 point ROS discussed and pertinent positives are indicated in the HPI above.  All other systems are negative.  Review of Systems  Constitutional: Negative.   HENT: Negative.    Eyes: Negative.   Respiratory: Negative.    Cardiovascular: Negative.   Gastrointestinal: Negative.   Endocrine: Negative.   Genitourinary: Negative.   Musculoskeletal:  Positive for arthralgias and myalgias.  Skin:        icthyosis  Allergic/Immunologic: Negative.   Neurological: Negative.   Hematological: Negative.   Psychiatric/Behavioral: Negative.     Vital Signs: BP (!) 141/87    Pulse 97    Temp 98.1 F (36.7 C) (Oral)    Resp 16    Ht 5' 8"  (1.727 m)    Wt 162 lb (73.5 kg)    SpO2 100%    BMI 24.63 kg/m   Physical Exam Vitals reviewed.  Constitutional:      Appearance: Normal appearance.  HENT:     Head: Normocephalic and atraumatic.     Mouth/Throat:     Mouth: Mucous membranes are moist.     Pharynx: Oropharynx is clear.  Eyes:     Extraocular Movements: Extraocular movements intact.  Cardiovascular:     Rate and Rhythm: Normal rate and regular rhythm.     Pulses: Normal pulses.     Heart sounds: Normal heart sounds.  Pulmonary:     Effort: Pulmonary effort is normal.     Breath sounds: Normal breath sounds.  Abdominal:     General: Abdomen is flat.     Palpations: Abdomen is soft.  Skin:    General: Skin is warm and dry.     Capillary Refill: Capillary refill takes less than 2 seconds.  Neurological:     General: No focal deficit present.     Mental Status: He is alert and oriented to person,  place, and time.  Psychiatric:        Mood and Affect: Mood normal.        Behavior: Behavior normal.    Imaging: No results found.  Labs:  CBC: Recent Labs    10/24/20 1002 03/19/21 0711 04/06/21 1338 04/17/21 0715  WBC 5.1 4.6 5.3 4.5  HGB 11.6* 11.7* 12.1* 10.3*  HCT 34.7* 34.2* 36.2* 31.5*  PLT 95* 97* 104* 97*    COAGS: Recent Labs    03/19/21 0711 04/17/21 0715  INR 0.9 0.8    BMP: Recent Labs  05/16/20 0944 12/02/20 0906 02/03/21 0859 03/19/21 0711 04/17/21 0715  NA 137  --   --  132* 136  K 4.4  --   --  4.1 4.0  CL 102  --   --  98 104  CO2 24  --   --  24 25  GLUCOSE 358*  --   --  456* 257*  BUN 32*  --   --  24* 18  CALCIUM 8.6  --   --  8.9 8.3*  CREATININE 1.56* 1.60* 1.70* 1.29* 1.37*  GFRNONAA 52*  --   --  >60 >60  GFRAA 60  --   --   --   --     LIVER FUNCTION TESTS: Recent Labs    05/16/20 0944  BILITOT 0.5  AST 15  ALT 23  PROT 6.6    Assessment and Plan:  R ICA cavernous segment stenosis Here for diagnostic arteriogram. History, Labs, vitals, and imaging reviewed. OK to proceed with anticipated discharge later today.  Risks and benefits of arteriogram were discussed with the patient including, but not limited to bleeding, infection, vascular injury or contrast induced renal failure.  This interventional procedure involves the use of X-rays and because of the nature of the planned procedure, it is possible that we will have prolonged use of X-ray fluoroscopy.  Potential radiation risks to you include (but are not limited to) the following: - A slightly elevated risk for cancer  several years later in life. This risk is typically less than 0.5% percent. This risk is low in comparison to the normal incidence of human cancer, which is 33% for women and 50% for men according to [employer]. - Radiation induced injury can include skin redness, resembling a rash, tissue breakdown / ulcers and hair loss  (which can be temporary or permanent).   The likelihood of either of these occurring depends on the difficulty of the procedure and whether you are sensitive to radiation due to previous procedures, disease, or genetic conditions.   IF your procedure requires a prolonged use of radiation, you will be notified and given written instructions for further action.  It is your responsibility to monitor the irradiated area for the 2 weeks following the procedure and to notify your physician if you are concerned that you have suffered a radiation induced injury.    All of the patient's questions were answered, patient is agreeable to proceed.  Consent signed and in chart.    Thank you for this interesting consult.  I greatly enjoyed meeting Leland Raver and look forward to participating in their care.  A copy of this report was sent to the requesting provider on this date.  Electronically Signed: Pasty Spillers, PA 04/17/2021, 7:38 AM   I spent a total of 25 Minutes in face to face in clinical consultation, greater than 50% of which was counseling/coordinating care for right ICA stenosis

## 2021-04-20 ENCOUNTER — Other Ambulatory Visit (HOSPITAL_COMMUNITY): Payer: Self-pay | Admitting: Interventional Radiology

## 2021-04-20 DIAGNOSIS — I771 Stricture of artery: Secondary | ICD-10-CM

## 2021-04-21 ENCOUNTER — Ambulatory Visit: Payer: Self-pay | Admitting: *Deleted

## 2021-04-21 NOTE — Telephone Encounter (Signed)
° ° °  Summary: Questions about angiogram procedure   Pt significant another Jose Koch stated pt had an angiogram right wrist  Asking how to change bandage and if its safe to remove wrist brace.     Seeking clinical advice.       Called patient and spoke with significant other on DPR, to answer questions regarding recent angiogram. Patient had angiogram Friday 04/17/21. Reviewed with Jose Koch bandage can be removed 24-48  hours after procedure. If bleeding noted after removal, please apply pressure x 5 minutes and redress area. Reinforced patient to review discharge instructions regarding bending of wrist , bandage and not lifting over 5-10 lbs until reviewing with PCP or cardiologist. No strenuous lifting and may remove wrist brace to remove bandage today since procedure on Friday. Jose Koch verbalized understanding to contact PCP and cardiology for further questions.     Reason for Disposition  Health Information question, no triage required and triager able to answer question  Answer Assessment - Initial Assessment Questions 1. REASON FOR CALL or QUESTION: "What is your reason for calling today?" or "How can I best help you?" or "What question do you have that I can help answer?"     Patient' s significant other on DPR would like to know if it is ok to remove bandage from right wrist and remove wrist brace after angiogram on Friday 04/17/21.  Protocols used: Information Only Call - No Triage-A-AH

## 2021-04-29 ENCOUNTER — Other Ambulatory Visit: Payer: Self-pay

## 2021-04-29 ENCOUNTER — Ambulatory Visit (HOSPITAL_COMMUNITY)
Admission: RE | Admit: 2021-04-29 | Discharge: 2021-04-29 | Disposition: A | Discharge status: Home / Self Care | Payer: Medicaid - Out of State | Source: Ambulatory Visit | Attending: Interventional Radiology | Admitting: Interventional Radiology

## 2021-04-29 DIAGNOSIS — I771 Stricture of artery: Secondary | ICD-10-CM

## 2021-05-04 HISTORY — PX: IR RADIOLOGIST EVAL & MGMT: IMG5224

## 2021-05-26 DIAGNOSIS — I729 Aneurysm of unspecified site: Secondary | ICD-10-CM | POA: Insufficient documentation

## 2021-05-26 DIAGNOSIS — G40109 Localization-related (focal) (partial) symptomatic epilepsy and epileptic syndromes with simple partial seizures, not intractable, without status epilepticus: Secondary | ICD-10-CM | POA: Insufficient documentation

## 2021-06-04 ENCOUNTER — Telehealth (HOSPITAL_COMMUNITY): Payer: Self-pay | Admitting: Radiology

## 2021-06-04 ENCOUNTER — Other Ambulatory Visit (HOSPITAL_COMMUNITY): Payer: Self-pay | Admitting: Interventional Radiology

## 2021-06-04 ENCOUNTER — Encounter (HOSPITAL_COMMUNITY): Payer: Self-pay

## 2021-06-04 DIAGNOSIS — I6521 Occlusion and stenosis of right carotid artery: Secondary | ICD-10-CM

## 2021-06-04 NOTE — Telephone Encounter (Signed)
Prescription for Plavix 75mg  1 QD called in per Dr. Estanislado Pandy for 30 tablets with 3 refills. JM ?

## 2021-06-12 ENCOUNTER — Other Ambulatory Visit: Payer: Self-pay

## 2021-06-12 ENCOUNTER — Other Ambulatory Visit (HOSPITAL_COMMUNITY)
Admission: RE | Admit: 2021-06-12 | Discharge: 2021-06-12 | Disposition: A | Discharge status: Home / Self Care | Payer: Medicaid - Out of State | Attending: Family Medicine | Admitting: Family Medicine

## 2021-06-12 DIAGNOSIS — E1165 Type 2 diabetes mellitus with hyperglycemia: Secondary | ICD-10-CM | POA: Insufficient documentation

## 2021-06-12 DIAGNOSIS — D696 Thrombocytopenia, unspecified: Secondary | ICD-10-CM | POA: Diagnosis not present

## 2021-06-12 DIAGNOSIS — I10 Essential (primary) hypertension: Secondary | ICD-10-CM | POA: Diagnosis not present

## 2021-06-12 DIAGNOSIS — Z125 Encounter for screening for malignant neoplasm of prostate: Secondary | ICD-10-CM | POA: Insufficient documentation

## 2021-06-12 DIAGNOSIS — M79606 Pain in leg, unspecified: Secondary | ICD-10-CM

## 2021-06-12 LAB — COMPREHENSIVE METABOLIC PANEL
ALT: 18 U/L (ref 0–44)
AST: 14 U/L — ABNORMAL LOW (ref 15–41)
Albumin: 4 g/dL (ref 3.5–5.0)
Alkaline Phosphatase: 97 U/L (ref 38–126)
Anion gap: 8 (ref 5–15)
BUN: 29 mg/dL — ABNORMAL HIGH (ref 6–20)
CO2: 27 mmol/L (ref 22–32)
Calcium: 9.3 mg/dL (ref 8.9–10.3)
Chloride: 103 mmol/L (ref 98–111)
Creatinine, Ser: 1.59 mg/dL — ABNORMAL HIGH (ref 0.61–1.24)
GFR, Estimated: 53 mL/min — ABNORMAL LOW (ref >60)
Glucose, Bld: 213 mg/dL — ABNORMAL HIGH (ref 70–99)
Potassium: 4.4 mmol/L (ref 3.5–5.1)
Sodium: 138 mmol/L (ref 135–145)
Total Bilirubin: 0.8 mg/dL (ref 0.3–1.2)
Total Protein: 6.6 g/dL (ref 6.5–8.1)

## 2021-06-12 LAB — HEMOGLOBIN A1C
Hgb A1c MFr Bld: 8.1 % — ABNORMAL HIGH (ref 4.8–5.6)
Mean Plasma Glucose: 185.77 mg/dL

## 2021-06-12 LAB — PSA: Prostatic Specific Antigen: 0.4 ng/mL (ref 0.00–4.00)

## 2021-06-17 ENCOUNTER — Telehealth (HOSPITAL_COMMUNITY): Payer: Self-pay

## 2021-06-17 NOTE — Telephone Encounter (Signed)
Pt came on Friday to get P2y12 but did not get it done. He will return tomorrow or Friday to get this done. I informed the wife that he has to come directly to radiology for this order printout and then they will be sent to the lab. AW  ?

## 2021-06-18 ENCOUNTER — Other Ambulatory Visit (HOSPITAL_COMMUNITY): Payer: Self-pay

## 2021-06-18 DIAGNOSIS — I771 Stricture of artery: Secondary | ICD-10-CM

## 2021-06-22 ENCOUNTER — Other Ambulatory Visit: Payer: Self-pay | Admitting: Student

## 2021-06-22 LAB — PLATELET INHIBITION P2Y12

## 2021-06-22 MED ORDER — TICAGRELOR 90 MG PO TABS: 90.0000 mg | ORAL_TABLET | Freq: Two times a day (BID) | ORAL | 0 refills | Status: DC

## 2021-06-23 ENCOUNTER — Encounter (HOSPITAL_COMMUNITY): Payer: Medicaid - Out of State

## 2021-06-23 ENCOUNTER — Ambulatory Visit: Payer: Medicaid - Out of State

## 2021-06-24 ENCOUNTER — Encounter (HOSPITAL_COMMUNITY): Admission: RE | Payer: Self-pay | Source: Home / Self Care

## 2021-06-24 ENCOUNTER — Encounter (HOSPITAL_COMMUNITY): Payer: Self-pay

## 2021-06-24 ENCOUNTER — Ambulatory Visit (HOSPITAL_COMMUNITY)
Admission: RE | Admit: 2021-06-24 | Payer: Medicaid - Out of State | Source: Home / Self Care | Admitting: Interventional Radiology

## 2021-06-24 ENCOUNTER — Observation Stay (HOSPITAL_COMMUNITY): Admission: RE | Admit: 2021-06-24 | Payer: Medicaid - Out of State | Source: Ambulatory Visit

## 2021-06-24 SURGERY — IR WITH ANESTHESIA
Anesthesia: General

## 2021-07-08 ENCOUNTER — Encounter (HOSPITAL_COMMUNITY): Payer: Self-pay

## 2021-07-13 ENCOUNTER — Other Ambulatory Visit: Payer: Self-pay

## 2021-07-13 ENCOUNTER — Encounter (HOSPITAL_COMMUNITY): Payer: Self-pay | Admitting: Interventional Radiology

## 2021-07-13 NOTE — Progress Notes (Signed)
Spoke with pt's spouse, Jose Koch for pre-op call. DPR on file. HTN is listed in pt's history. Jose Koch states he's never been treated for HTN. She states he does not have a cardiac history. Pt is diabetic. Last A1C ws 8.1 on 06/12/21. Jose Koch states his fasting blood sugar is between 120-150. Instructed Jose Koch to have pt take 1/2 of his regular dose of Basaglar Insulin Tuesday PM. He will take 15 units. Pt will not take Metformin the day of surgery. Instructed Jose Koch to have pt check his blood sugar when he wakes up Wednesday AM. If blood sugar is 70 or below, treat with 1/2 cup of clear juice (apple or cranberry) and recheck blood sugar 15 minutes after drinking juice. If blood sugar continues to be 70 or below, call the Short Stay department and ask to speak to a nurse. Jose Koch voiced understanding.   Shower instructions given to [place] and she voiced understanding.

## 2021-07-14 ENCOUNTER — Other Ambulatory Visit: Payer: Self-pay | Admitting: Radiology

## 2021-07-14 DIAGNOSIS — I6521 Occlusion and stenosis of right carotid artery: Secondary | ICD-10-CM

## 2021-07-14 NOTE — H&P (Deleted)
  The note originally documented on this encounter has been moved the the encounter in which it belongs.  

## 2021-07-14 NOTE — H&P (Signed)
Chief Complaint: Patient was seen in consultation today for right ICA stenosis  Referring Physician(s): Metta Clines, MD  Supervising Physician: Luanne Bras  Patient Status: [facility] - Out-pt  History of Present Illness: Jose Koch is a 51 y.o. male with a past medical history significant for GERD, thrombocytopenia, splenomegaly, HTN, HLD, DM, temporal lobe epilepsy and severe right ICA cavernous segment stenosis who presents today for cerebral angiogram with possible angioplasty/stent placement within the right ICA stenosis. Jose Koch was referred to [facility] in January of this year due to incidental finding of severe right ICA stenosis during work up for suspected temporal lobe epilepsy. He underwent a diagnostic cerebral angiogram 04/19/21 with Dr. 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   Approximately 50% stenosis of the proximal right internal carotid artery.   Approximately 20-25% stenosis of the left internal carotid artery proximally.  He returns today for repeat cerebral angiogram and possible angioplasty/stent placement within the right ICA stenosis under general anesthesia.   Past Medical History:  Diagnosis Date   COVID    COVID 02/2020   very sick - admitted to the hospital   Diabetes mellitus without complication ([place])    GERD (gastroesophageal reflux disease)    Headache    High cholesterol    History of kidney stones    Hypertension    was diagnosed 2 years ago, never treated and states BP is always - documented 07/13/21   Ichthyosis vulgaris    Pneumonia    Sleep apnea    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    Past Surgical History:  Procedure Laterality Date    IR ANGIO INTRA EXTRACRAN SEL COM CAROTID INNOMINATE BILAT MOD SED  04/17/2021   IR ANGIO VERTEBRAL SEL VERTEBRAL BILAT MOD SED  04/17/2021   IR RADIOLOGIST EVAL & MGMT  03/05/2021   IR RADIOLOGIST EVAL & MGMT  05/04/2021   IR US GUIDE VASC ACCESS RIGHT  04/17/2021   TONSILLECTOMY     wisdom teeth removal      Allergies: Other  Medications: Prior to Admission medications   Medication Sig Start Date End Date Taking? Authorizing Provider  aspirin EC 81 MG tablet Take 81 mg by mouth daily. Swallow whole.   Yes [provider]  Insulin Glargine (BASAGLAR KWIKPEN) 100 UNIT/ML Inject 30 Units into the skin at bedtime.   Yes [provider]  metFORMIN (GLUCOPHAGE XR) 500 MG 24 hr tablet Take 1 tablet (500 mg total) by mouth daily with breakfast. 05/22/20  Yes Brita Romp, NP  metoCLOPramide (REGLAN) 10 MG tablet Take 10 mg by mouth 3 (three) times daily before meals.   Yes [provider]  pantoprazole (PROTONIX) 40 MG tablet TAKE 1 TABLET BY MOUTH 2 TIMES DAILY BEFORE A MEAL 30 MINUTES BEFORE BREAKFAST AND DINNER 10/16/20  Yes Mahala Menghini, PA-C  ticagrelor (BRILINTA) 90 MG TABS tablet Take 1 tablet (90 mg total) by mouth 2 (two) times daily. 06/22/21  Yes Han, Aimee H, PA-C  blood glucose meter kit and supplies KIT Dispense based on patient and insurance preference (something with affordable test strips). Use to check glucose twice daily as directed. 08/18/20   Brita Romp, NP  glucose blood (  ACCU-CHEK GUIDE) test strip Use as instructed to monitor glucose twice daily, before breakfast and before bed 05/13/20   Brita Romp, NP  Insulin Pen Needle 31G X 5 MM MISC 1 Units by Does not apply route daily. 04/07/20   Manuella Ghazi, Pratik D, DO  OneTouch Delica Lancets 29F MISC PLEASE SEE ATTACHED FOR DETAILED DIRECTIONS 04/08/20   [provider]     Family History  Family history unknown: Yes    Social History   Socioeconomic History   Marital status:  Single    Spouse name: Not on file   Number of children: Not on file   Years of education: Not on file   Highest education level: Not on file  Occupational History   Occupation: unemployed  Tobacco Use   Smoking status: Never   Smokeless tobacco: Never  Vaping Use   Vaping Use: Never used  Substance and Sexual Activity   Alcohol use: Not Currently   Drug use: Never   Sexual activity: Not on file  Other Topics Concern   Not on file  Social History Narrative   Right handed    Social Determinants of Health   Financial Resource Strain: Not on file  Food Insecurity: Not on file  Transportation Needs: Not on file  Physical Activity: Not on file  Stress: Not on file  Social Connections: Not on file     Review of Systems: A 12 point ROS discussed and pertinent positives are indicated in the HPI above.  All other systems are negative.  Review of Systems  Constitutional:  Negative for chills and fever.  Respiratory:  Negative for cough and shortness of breath.   Cardiovascular:  Negative for chest pain.  Gastrointestinal:  Negative for abdominal pain, diarrhea, nausea and vomiting.  Musculoskeletal:  Negative for back pain.  Neurological:  Positive for weakness (BLE due to metformin per patient). Negative for dizziness, numbness and headaches.  Psychiatric/Behavioral:  Negative for confusion.    Vital Signs: There were no vitals taken for this visit.  Physical Exam Vitals reviewed.  Constitutional:      General: He is not in acute distress. HENT:     Head: Normocephalic.  Cardiovascular:     Rate and Rhythm: Normal rate and regular rhythm.  Pulmonary:     Effort: Pulmonary effort is normal.     Breath sounds: Normal breath sounds.  Abdominal:     General: There is no distension.     Palpations: Abdomen is soft.     Tenderness: There is no abdominal tenderness.  Skin:    General: Skin is warm and dry.  Neurological:     Mental Status: He is alert and oriented to  person, place, and time.  Psychiatric:        Mood and Affect: Mood normal.        Behavior: Behavior normal.        Thought Content: Thought content normal.        Judgment: Judgment normal.     Imaging: No results found.  Labs:  CBC: Recent Labs    10/24/20 1002 03/19/21 0711 04/06/21 1338 04/17/21 0715  WBC 5.1 4.6 5.3 4.5  HGB 11.6* 11.7* 12.1* 10.3*  HCT 34.7* 34.2* 36.2* 31.5*  PLT 95* 97* 104* 97*    COAGS: Recent Labs    03/19/21 0711 04/17/21 0715  INR 0.9 0.8    BMP: Recent Labs    02/03/21 0859 03/19/21 0711 04/17/21 0715 06/12/21 1200  NA  --  132* 136 138  K  --  4.1 4.0 4.4  CL  --  98 104 103  CO2  --  _0 GLUCOSE  --  456* 257* 213*  BUN  --  24* 18 29*  CALCIUM  --  8.9 8.3* 9.3  CREATININE 1.70* 1.29* 1.37* 1.59*  GFRNONAA  --  >60 >60 53*    LIVER FUNCTION TESTS: Recent Labs    06/12/21 1200  BILITOT 0.8  AST 14*  ALT 18  ALKPHOS 97  PROT 6.6  ALBUMIN 4.0    TUMOR MARKERS: No results for input(s): AFPTM, CEA, CA199, CHROMGRNA in the last 8760 hours.  Assessment and Plan:  51 y/o M with incidentally noted severe right ICA stenosis who presents today for cerebral angiogram with possible angioplasty/possible stent placement under general anesthesia.   Patient to be admitted for overnight observation post procedure which he is agreeable to. NPO since midnight, last dose of Brilinta 5/24, last dose of ASA 5/24. Afebrile, WBC 4.2, hgb 10.7, plt 103, INR 0.8, creatinine 1.45. P2Y12 213 on 06/18/21.  Risks and benefits of cerebral arteriogram with intervention were discussed with the patient including, but not limited to bleeding, infection, vascular injury, contrast induced renal failure, stroke, reperfusion hemorrhage, or even death.  This interventional procedure involves the use of X-rays and because of the nature of the planned procedure, it is possible that we will have prolonged use of X-ray fluoroscopy. Potential  radiation risks to you include (but are not limited to) the following: - A slightly elevated risk for cancer  several years later in life. This risk is typically less than 0.5% percent. This risk is low in comparison to the normal incidence of human cancer, which is 33% for women and 50% for men according to the [place]. - Radiation induced injury can include skin redness, resembling a rash, tissue breakdown / ulcers and hair loss (which can be temporary or permanent).  The likelihood of either of these occurring depends on the difficulty of the procedure and whether you are sensitive to radiation due to previous procedures, disease, or genetic conditions.  IF your procedure requires a prolonged use of radiation, you will be notified and given written instructions for further action.  It is your responsibility to monitor the irradiated area for the 2 weeks following the procedure and to notify your physician if you are concerned that you have suffered a radiation induced injury.    All of the patient's questions were answered, patient is agreeable to proceed.  Consent signed and in chart.  Thank you for this interesting consult.  I greatly enjoyed meeting Jose Koch and look forward to participating in their care.  A copy of this report was sent to the requesting provider on this date.  Electronically Signed: Joaquim Nam, PA-C 07/14/2021, 9:42 AM   I spent a total of  40 Minutes in face to face in clinical consultation, greater than 50% of which was counseling/coordinating care for right ICA stenosis.

## 2021-07-15 ENCOUNTER — Other Ambulatory Visit (HOSPITAL_COMMUNITY): Payer: Self-pay

## 2021-07-15 ENCOUNTER — Ambulatory Visit (HOSPITAL_COMMUNITY): Payer: Medicaid - Out of State | Admitting: Anesthesiology

## 2021-07-15 ENCOUNTER — Inpatient Hospital Stay (HOSPITAL_COMMUNITY)
Admission: RE | Admit: 2021-07-15 | Discharge: 2021-07-15 | Disposition: A | Discharge status: Home / Self Care | Payer: Medicaid - Out of State | Source: Ambulatory Visit | Attending: Interventional Radiology | Admitting: Interventional Radiology

## 2021-07-15 ENCOUNTER — Inpatient Hospital Stay (HOSPITAL_COMMUNITY)
Admission: AD | Admit: 2021-07-15 | Discharge: 2021-07-16 | DRG: 027 | Disposition: A | Discharge status: Home / Self Care | Payer: Medicaid - Out of State | Attending: Interventional Radiology | Admitting: Interventional Radiology

## 2021-07-15 ENCOUNTER — Encounter (HOSPITAL_COMMUNITY)
Admission: AD | Disposition: A | Discharge status: Home / Self Care | Payer: Self-pay | Source: Home / Self Care | Attending: Interventional Radiology

## 2021-07-15 ENCOUNTER — Encounter (HOSPITAL_COMMUNITY): Payer: Self-pay | Admitting: Interventional Radiology

## 2021-07-15 ENCOUNTER — Other Ambulatory Visit: Payer: Self-pay

## 2021-07-15 DIAGNOSIS — I6521 Occlusion and stenosis of right carotid artery: Secondary | ICD-10-CM

## 2021-07-15 DIAGNOSIS — G4730 Sleep apnea, unspecified: Secondary | ICD-10-CM | POA: Diagnosis present

## 2021-07-15 DIAGNOSIS — Z8616 Personal history of COVID-19: Secondary | ICD-10-CM | POA: Diagnosis not present

## 2021-07-15 DIAGNOSIS — Z7984 Long term (current) use of oral hypoglycemic drugs: Secondary | ICD-10-CM

## 2021-07-15 DIAGNOSIS — Z794 Long term (current) use of insulin: Secondary | ICD-10-CM | POA: Diagnosis not present

## 2021-07-15 DIAGNOSIS — Z79899 Other long term (current) drug therapy: Secondary | ICD-10-CM

## 2021-07-15 DIAGNOSIS — E7800 Pure hypercholesterolemia, unspecified: Secondary | ICD-10-CM | POA: Diagnosis not present

## 2021-07-15 DIAGNOSIS — R000 Tachycardia, unspecified: Secondary | ICD-10-CM | POA: Diagnosis not present

## 2021-07-15 DIAGNOSIS — Z7982 Long term (current) use of aspirin: Secondary | ICD-10-CM

## 2021-07-15 DIAGNOSIS — I10 Essential (primary) hypertension: Secondary | ICD-10-CM

## 2021-07-15 DIAGNOSIS — R519 Headache, unspecified: Secondary | ICD-10-CM | POA: Diagnosis not present

## 2021-07-15 DIAGNOSIS — K219 Gastro-esophageal reflux disease without esophagitis: Secondary | ICD-10-CM | POA: Diagnosis present

## 2021-07-15 DIAGNOSIS — I671 Cerebral aneurysm, nonruptured: Secondary | ICD-10-CM | POA: Diagnosis not present

## 2021-07-15 DIAGNOSIS — R112 Nausea with vomiting, unspecified: Secondary | ICD-10-CM | POA: Diagnosis present

## 2021-07-15 DIAGNOSIS — R1114 Bilious vomiting: Secondary | ICD-10-CM | POA: Diagnosis not present

## 2021-07-15 DIAGNOSIS — E119 Type 2 diabetes mellitus without complications: Secondary | ICD-10-CM | POA: Diagnosis not present

## 2021-07-15 HISTORY — PX: IR CT HEAD LTD: IMG2386

## 2021-07-15 HISTORY — PX: RADIOLOGY WITH ANESTHESIA: SHX6223

## 2021-07-15 HISTORY — DX: Splenomegaly, not elsewhere classified: R16.1

## 2021-07-15 HISTORY — DX: Gastro-esophageal reflux disease without esophagitis: K21.9

## 2021-07-15 HISTORY — PX: IR INTRA CRAN STENT: IMG2345

## 2021-07-15 HISTORY — DX: Personal history of urinary calculi: Z87.442

## 2021-07-15 HISTORY — DX: Headache, unspecified: R51.9

## 2021-07-15 HISTORY — PX: IR US GUIDE VASC ACCESS RIGHT: IMG2390

## 2021-07-15 HISTORY — DX: Pneumonia, unspecified organism: J18.9

## 2021-07-15 HISTORY — DX: Sleep apnea, unspecified: G47.30

## 2021-07-15 LAB — CBC WITH DIFFERENTIAL/PLATELET
Abs Immature Granulocytes: 0.02 10*3/uL (ref 0.00–0.07)
Basophils Absolute: 0 10*3/uL (ref 0.0–0.1)
Basophils Relative: 1 %
Eosinophils Absolute: 0 10*3/uL (ref 0.0–0.5)
Eosinophils Relative: 1 %
HCT: 31.9 % — ABNORMAL LOW (ref 39.0–52.0)
Hemoglobin: 10.7 g/dL — ABNORMAL LOW (ref 13.0–17.0)
Immature Granulocytes: 1 %
Lymphocytes Relative: 26 %
Lymphs Abs: 1.1 10*3/uL (ref 0.7–4.0)
MCH: 27.4 pg (ref 26.0–34.0)
MCHC: 33.5 g/dL (ref 30.0–36.0)
MCV: 81.8 fL (ref 80.0–100.0)
Monocytes Absolute: 0.2 10*3/uL (ref 0.1–1.0)
Monocytes Relative: 6 %
Neutro Abs: 2.8 10*3/uL (ref 1.7–7.7)
Neutrophils Relative %: 65 %
Platelets: 103 10*3/uL — ABNORMAL LOW (ref 150–400)
RBC: 3.9 MIL/uL — ABNORMAL LOW (ref 4.22–5.81)
RDW: 13.1 % (ref 11.5–15.5)
WBC: 4.2 10*3/uL (ref 4.0–10.5)
nRBC: 0 % (ref 0.0–0.2)

## 2021-07-15 LAB — BASIC METABOLIC PANEL
Anion gap: 8 (ref 5–15)
BUN: 23 mg/dL — ABNORMAL HIGH (ref 6–20)
CO2: 24 mmol/L (ref 22–32)
Calcium: 8.9 mg/dL (ref 8.9–10.3)
Chloride: 104 mmol/L (ref 98–111)
Creatinine, Ser: 1.45 mg/dL — ABNORMAL HIGH (ref 0.61–1.24)
GFR, Estimated: 59 mL/min — ABNORMAL LOW (ref >60)
Glucose, Bld: 154 mg/dL — ABNORMAL HIGH (ref 70–99)
Potassium: 4.4 mmol/L (ref 3.5–5.1)
Sodium: 136 mmol/L (ref 135–145)

## 2021-07-15 LAB — PROTIME-INR
INR: 0.8 (ref 0.8–1.2)
Prothrombin Time: 11.4 seconds (ref 11.4–15.2)

## 2021-07-15 LAB — GLUCOSE, CAPILLARY
Glucose-Capillary: 156 mg/dL — ABNORMAL HIGH (ref 70–99)
Glucose-Capillary: 164 mg/dL — ABNORMAL HIGH (ref 70–99)
Glucose-Capillary: 131 mg/dL — ABNORMAL HIGH (ref 70–99)
Glucose-Capillary: 133 mg/dL — ABNORMAL HIGH (ref 70–99)
Glucose-Capillary: 164 mg/dL — ABNORMAL HIGH (ref 70–99)
Glucose-Capillary: 218 mg/dL — ABNORMAL HIGH (ref 70–99)

## 2021-07-15 LAB — POCT ACTIVATED CLOTTING TIME: Activated Clotting Time: 227 seconds

## 2021-07-15 LAB — HEPARIN LEVEL (UNFRACTIONATED): Heparin Unfractionated: 0.18 IU/mL — ABNORMAL LOW (ref 0.30–0.70)

## 2021-07-15 LAB — MRSA NEXT GEN BY PCR, NASAL: MRSA by PCR Next Gen: NOT DETECTED

## 2021-07-15 SURGERY — IR WITH ANESTHESIA
Anesthesia: General

## 2021-07-15 MED ORDER — PHENYLEPHRINE HCL-NACL 20-0.9 MG/250ML-% IV SOLN
INTRAVENOUS | Status: DC | PRN
Start: 2021-07-15 — End: 2021-07-15
  Administered 2021-07-15: 20 ug/min via INTRAVENOUS

## 2021-07-15 MED ORDER — CHLORHEXIDINE GLUCONATE 0.12 % MT SOLN
15.0000 mL | Freq: Once | OROMUCOSAL | Status: AC
  Administered 2021-07-15: 15 mL via OROMUCOSAL

## 2021-07-15 MED ORDER — CLEVIDIPINE BUTYRATE 0.5 MG/ML IV EMUL
INTRAVENOUS | Status: AC
  Filled 2021-07-15: qty 50

## 2021-07-15 MED ORDER — PANTOPRAZOLE SODIUM 40 MG PO TBEC
40.0000 mg | DELAYED_RELEASE_TABLET | Freq: Every day | ORAL | Status: DC
  Administered 2021-07-15 – 2021-07-16 (×2): 40 mg via ORAL
  Filled 2021-07-15 (×2): qty 1

## 2021-07-15 MED ORDER — GLYCOPYRROLATE 0.2 MG/ML IJ SOLN
INTRAMUSCULAR | Status: DC | PRN
  Administered 2021-07-15: .2 mg via INTRAVENOUS

## 2021-07-15 MED ORDER — IOHEXOL 300 MG/ML  SOLN
100.0000 mL | Freq: Once | INTRAMUSCULAR | Status: AC | PRN
  Administered 2021-07-15: 75 mL via INTRA_ARTERIAL

## 2021-07-15 MED ORDER — CLOPIDOGREL BISULFATE 75 MG PO TABS
75.0000 mg | ORAL_TABLET | ORAL | Status: DC
Start: 2021-07-15 — End: 2021-07-15

## 2021-07-15 MED ORDER — AMISULPRIDE (ANTIEMETIC) 5 MG/2ML IV SOLN: 10.0000 mg | Freq: Once | INTRAVENOUS | Status: DC | PRN

## 2021-07-15 MED ORDER — NIMODIPINE 30 MG PO CAPS
0.0000 mg | ORAL_CAPSULE | ORAL | Status: AC
  Administered 2021-07-15: 30 mg via ORAL
  Filled 2021-07-15: qty 1

## 2021-07-15 MED ORDER — TICAGRELOR 90 MG PO TABS
90.0000 mg | ORAL_TABLET | Freq: Two times a day (BID) | ORAL | Status: DC
  Administered 2021-07-15 – 2021-07-16 (×2): 90 mg via ORAL
  Filled 2021-07-15 (×2): qty 1

## 2021-07-15 MED ORDER — ORAL CARE MOUTH RINSE: 15.0000 mL | Freq: Once | OROMUCOSAL | Status: AC

## 2021-07-15 MED ORDER — ACETAMINOPHEN 650 MG RE SUPP: 650.0000 mg | RECTAL | Status: DC | PRN

## 2021-07-15 MED ORDER — METOCLOPRAMIDE HCL 10 MG PO TABS
10.0000 mg | ORAL_TABLET | Freq: Three times a day (TID) | ORAL | Status: DC
  Administered 2021-07-15 – 2021-07-16 (×2): 10 mg via ORAL
  Filled 2021-07-15 (×3): qty 1

## 2021-07-15 MED ORDER — NITROGLYCERIN 1 MG/10 ML FOR IR/CATH LAB
INTRA_ARTERIAL | Status: AC
  Filled 2021-07-15: qty 10

## 2021-07-15 MED ORDER — DEXAMETHASONE SODIUM PHOSPHATE 10 MG/ML IJ SOLN
INTRAMUSCULAR | Status: DC | PRN
  Administered 2021-07-15: 4 mg via INTRAVENOUS

## 2021-07-15 MED ORDER — VERAPAMIL HCL 2.5 MG/ML IV SOLN
INTRAVENOUS | Status: AC
  Filled 2021-07-15: qty 2

## 2021-07-15 MED ORDER — METOCLOPRAMIDE HCL 10 MG PO TABS
10.0000 mg | ORAL_TABLET | Freq: Three times a day (TID) | ORAL | Status: DC
  Filled 2021-07-15: qty 1

## 2021-07-15 MED ORDER — ASPIRIN 325 MG PO TBEC: 325.0000 mg | DELAYED_RELEASE_TABLET | ORAL | Status: DC

## 2021-07-15 MED ORDER — HEPARIN (PORCINE) 25000 UT/250ML-% IV SOLN
INTRAVENOUS | Status: AC
  Filled 2021-07-15: qty 250

## 2021-07-15 MED ORDER — MIDAZOLAM HCL 2 MG/2ML IJ SOLN
INTRAMUSCULAR | Status: DC | PRN
  Administered 2021-07-15: 2 mg via INTRAVENOUS

## 2021-07-15 MED ORDER — FENTANYL CITRATE (PF) 100 MCG/2ML IJ SOLN: 25.0000 ug | INTRAMUSCULAR | Status: DC | PRN

## 2021-07-15 MED ORDER — SODIUM CHLORIDE 0.9 % IV SOLN: INTRAVENOUS | Status: DC | PRN

## 2021-07-15 MED ORDER — HEPARIN (PORCINE) 25000 UT/250ML-% IV SOLN
500.0000 [IU]/h | INTRAVENOUS | Status: DC
  Administered 2021-07-15 (×2): 500 [IU]/h via INTRAVENOUS
  Filled 2021-07-15: qty 250

## 2021-07-15 MED ORDER — ROCURONIUM BROMIDE 10 MG/ML (PF) SYRINGE
PREFILLED_SYRINGE | INTRAVENOUS | Status: DC | PRN
  Administered 2021-07-15: 100 mg via INTRAVENOUS

## 2021-07-15 MED ORDER — PHENYLEPHRINE 80 MCG/ML (10ML) SYRINGE FOR IV PUSH (FOR BLOOD PRESSURE SUPPORT)
PREFILLED_SYRINGE | INTRAVENOUS | Status: DC | PRN
  Administered 2021-07-15: 80 ug via INTRAVENOUS

## 2021-07-15 MED ORDER — TICAGRELOR 90 MG PO TABS: 90.0000 mg | ORAL_TABLET | Freq: Two times a day (BID) | ORAL | Status: DC

## 2021-07-15 MED ORDER — ESMOLOL HCL 100 MG/10ML IV SOLN
INTRAVENOUS | Status: DC | PRN
  Administered 2021-07-15: 10 mg via INTRAVENOUS

## 2021-07-15 MED ORDER — SODIUM CHLORIDE 0.9 % IV SOLN: INTRAVENOUS | Status: DC

## 2021-07-15 MED ORDER — CHLORHEXIDINE GLUCONATE CLOTH 2 % EX PADS
6.0000 | MEDICATED_PAD | Freq: Every day | CUTANEOUS | Status: DC
  Administered 2021-07-15: 6 via TOPICAL

## 2021-07-15 MED ORDER — ONDANSETRON HCL 4 MG/2ML IJ SOLN: 4.0000 mg | Freq: Once | INTRAMUSCULAR | Status: DC | PRN

## 2021-07-15 MED ORDER — FENTANYL CITRATE (PF) 250 MCG/5ML IJ SOLN
INTRAMUSCULAR | Status: DC | PRN
  Administered 2021-07-15: 100 ug via INTRAVENOUS

## 2021-07-15 MED ORDER — PROPOFOL 10 MG/ML IV BOLUS
INTRAVENOUS | Status: DC | PRN
  Administered 2021-07-15: 130 mg via INTRAVENOUS

## 2021-07-15 MED ORDER — HEPARIN SODIUM (PORCINE) 1000 UNIT/ML IJ SOLN
INTRAMUSCULAR | Status: AC
  Filled 2021-07-15: qty 10

## 2021-07-15 MED ORDER — CEFAZOLIN SODIUM-DEXTROSE 2-4 GM/100ML-% IV SOLN
2.0000 g | INTRAVENOUS | Status: AC
  Administered 2021-07-15: 2 g via INTRAVENOUS
  Filled 2021-07-15: qty 100

## 2021-07-15 MED ORDER — CLEVIDIPINE BUTYRATE 0.5 MG/ML IV EMUL
0.0000 mg/h | INTRAVENOUS | Status: DC
  Administered 2021-07-15: 4 mg/h via INTRAVENOUS
  Administered 2021-07-15: 1 mg/h via INTRAVENOUS
  Administered 2021-07-15: 10 mg/h via INTRAVENOUS
  Administered 2021-07-15: 3 mg/h via INTRAVENOUS
  Administered 2021-07-16: 4 mg/h via INTRAVENOUS
  Filled 2021-07-15 (×3): qty 50
  Filled 2021-07-15: qty 200

## 2021-07-15 MED ORDER — LACTATED RINGERS IV SOLN: INTRAVENOUS | Status: DC

## 2021-07-15 MED ORDER — INSULIN ASPART 100 UNIT/ML IJ SOLN
0.0000 [IU] | Freq: Three times a day (TID) | INTRAMUSCULAR | Status: DC
  Administered 2021-07-15: 3 [IU] via SUBCUTANEOUS
  Administered 2021-07-16: 5 [IU] via SUBCUTANEOUS

## 2021-07-15 MED ORDER — INSULIN ASPART 100 UNIT/ML IJ SOLN: 0.0000 [IU] | INTRAMUSCULAR | Status: DC | PRN

## 2021-07-15 MED ORDER — HEPARIN SODIUM (PORCINE) 1000 UNIT/ML IJ SOLN
INTRAMUSCULAR | Status: DC | PRN
  Administered 2021-07-15: 1000 [IU] via INTRAVENOUS

## 2021-07-15 MED ORDER — ASPIRIN 81 MG PO CHEW
81.0000 mg | CHEWABLE_TABLET | Freq: Every day | ORAL | Status: DC
  Administered 2021-07-16: 81 mg via ORAL
  Filled 2021-07-15: qty 1

## 2021-07-15 MED ORDER — EPTIFIBATIDE 20 MG/10ML IV SOLN
INTRAVENOUS | Status: AC | PRN
  Administered 2021-07-15 (×2): 1.5 mg via INTRAVENOUS

## 2021-07-15 MED ORDER — HEPARIN (PORCINE) 25000 UT/250ML-% IV SOLN
600.0000 [IU]/h | INTRAVENOUS | Status: DC
Start: 2021-07-15 — End: 2021-07-16

## 2021-07-15 MED ORDER — LIDOCAINE 2% (20 MG/ML) 5 ML SYRINGE
INTRAMUSCULAR | Status: DC | PRN
Start: 2021-07-15 — End: 2021-07-15
  Administered 2021-07-15: 60 mg via INTRAVENOUS

## 2021-07-15 MED ORDER — ACETAMINOPHEN 160 MG/5ML PO SOLN: 650.0000 mg | ORAL | Status: DC | PRN

## 2021-07-15 MED ORDER — ONDANSETRON HCL 4 MG/2ML IJ SOLN
INTRAMUSCULAR | Status: DC | PRN
  Administered 2021-07-15: 4 mg via INTRAVENOUS

## 2021-07-15 MED ORDER — INSULIN ASPART 100 UNIT/ML IJ SOLN
INTRAMUSCULAR | Status: AC
  Administered 2021-07-15: 2 [IU] via SUBCUTANEOUS
  Filled 2021-07-15: qty 1

## 2021-07-15 MED ORDER — ASPIRIN 81 MG PO CHEW
81.0000 mg | CHEWABLE_TABLET | Freq: Every day | ORAL | Status: DC
Start: 2021-07-16 — End: 2021-07-16

## 2021-07-15 MED ORDER — VERAPAMIL HCL 2.5 MG/ML IV SOLN: INTRA_ARTERIAL | Status: AC | PRN

## 2021-07-15 MED ORDER — EPTIFIBATIDE 20 MG/10ML IV SOLN
INTRAVENOUS | Status: AC
  Filled 2021-07-15: qty 10

## 2021-07-15 MED ORDER — LIDOCAINE HCL 1 % IJ SOLN
INTRAMUSCULAR | Status: AC
  Filled 2021-07-15: qty 20

## 2021-07-15 MED ORDER — LABETALOL HCL 5 MG/ML IV SOLN
INTRAVENOUS | Status: DC | PRN
  Administered 2021-07-15: 5 mg via INTRAVENOUS

## 2021-07-15 MED ORDER — ACETAMINOPHEN 325 MG PO TABS: 650.0000 mg | ORAL_TABLET | ORAL | Status: DC | PRN

## 2021-07-15 NOTE — Progress Notes (Signed)
ANTICOAGULATION CONSULT NOTE  Pharmacy Consult for heparin Indication:  Post Interventional Neuroradiology Procedure  Heparin Dosing Weight: 72.6 kg  Labs: Recent Labs    07/15/21 0710  HGB 10.7*  HCT 31.9*  PLT 103*  LABPROT 11.4  INR 0.8  CREATININE 1.45*     Assessment: 50 yom presenting with severe R ICA cavernous segment stenosis, s/p cerebral angiogram with angioplasty/stenting 5/24. Pharmacy consulted to dose heparin per post-interventional neuroradiology procedure protocol - already started at 500 units/hr in PACU. Hg 10.7, plt 103. Noted hx TCP. RN reports new hematoma site, holding pressure currently. Patient is not on anticoagulation PTA.  Nurse report hematoma stable Heparin level: 0.18 (on heparin 600 units/hr)  Goal of Therapy:  Heparin level 0.1-0.25 units/ml Monitor platelets by anticoagulation protocol: Yes   Plan:  Continue heparin drip at 600 units/hr (~8 units/kg/hr) Monitor CBC, s/sx bleeding, closely for hematoma expansion Heparin off at 0800 on 5/25 per protocol   Thank you for allowing pharmacy to be a part of this patient's care.  Tomie China, PharmD Clinical Pharmacist  Please check AMION for all [facility] Pharmacy numbers After 10:00 PM, call Main Pharmacy [phone]

## 2021-07-15 NOTE — Anesthesia Postprocedure Evaluation (Signed)
Anesthesia Post Note  Patient: Norwood Quezada  Procedure(s) Performed: STENTING     Patient location during evaluation: PACU Anesthesia Type: General Level of consciousness: awake and alert, oriented and patient cooperative Pain management: pain level controlled Vital Signs Assessment: post-procedure vital signs reviewed and stable Respiratory status: spontaneous breathing, nonlabored ventilation and respiratory function stable Cardiovascular status: blood pressure returned to baseline and stable Postop Assessment: no apparent nausea or vomiting Anesthetic complications: no   No notable events documented.  Last Vitals:  Vitals:   07/15/21 1330 07/15/21 1345  BP: 112/69   Pulse: 86 89  Resp: 15 12  Temp:    SpO2: 97% 96%    Last Pain:  Vitals:   07/15/21 1225  TempSrc: Oral  PainSc: 0-No pain    LLE Motor Response: Purposeful movement (07/15/21 1345) LLE Sensation: Full sensation (07/15/21 1345) RLE Motor Response: Purposeful movement (07/15/21 1345) RLE Sensation: Full sensation (07/15/21 1345)      Lannie Fields

## 2021-07-15 NOTE — Sedation Documentation (Signed)
Sheath removed and right radial TR band applied with 12 cc at 1046.  Handoff with PACU RN at 1115. Right radial TR band remains with 12 cc air, pulse +1, no hematoma, site level 0.

## 2021-07-15 NOTE — Transfer of Care (Signed)
Immediate Anesthesia Transfer of Care Note  Patient: Jose Koch  Procedure(s) Performed: STENTING  Patient Location: PACU  Anesthesia Type:General  Level of Consciousness: awake, alert  and oriented  Airway & Oxygen Therapy: Patient Spontanous Breathing  Post-op Assessment: Report given to RN, Post -op Vital signs reviewed and stable, Patient moving all extremities X 4 and Patient able to stick tongue midline  Post vital signs: Reviewed and stable  Last Vitals:  Vitals Value Taken Time  BP 124/78 07/15/21 1119  Temp    Pulse 87 07/15/21 1126  Resp 22 07/15/21 1126  SpO2 94 % 07/15/21 1126  Vitals shown include unvalidated device data.  Last Pain:  Vitals:   07/15/21 0801  PainSc: 1       Patients Stated Pain Goal: 2 (07/15/21 0801)  Complications: No notable events documented.

## 2021-07-15 NOTE — Progress Notes (Addendum)
ANTICOAGULATION CONSULT NOTE  Pharmacy Consult for heparin Indication:  Post Interventional Neuroradiology Procedure  Heparin Dosing Weight: 72.6 kg  Labs: Recent Labs    07/15/21 0710  HGB 10.7*  HCT 31.9*  PLT 103*  LABPROT 11.4  INR 0.8  CREATININE 1.45*    Assessment: 50 yom presenting with severe R ICA cavernous segment stenosis, s/p cerebral angiogram with angioplasty/stenting 5/24. Pharmacy consulted to dose heparin per post-interventional neuroradiology procedure protocol - already started at 500 units/hr in PACU. Hg 10.7, plt 103. Noted hx TCP. RN reports new hematoma site, holding pressure currently. Patient is not on anticoagulation PTA.  Update - small hematoma not expanding per RN. Marking and watching. To notify Pharmacy if worsens. Next check at 1600.  Goal of Therapy:  Heparin level 0.1-0.25 units/ml Monitor platelets by anticoagulation protocol: Yes   Plan:  No bolus. Increase heparin slightly to 600 units/hr (~8 units/kg/hr) Check 6hr heparin level Monitor CBC, s/sx bleeding, closely for hematoma expansion Heparin off at 0800 on 5/25 per protocol   Leia Alf, PharmD, BCPS Clinical Pharmacist 07/15/2021 1:36 PM

## 2021-07-15 NOTE — Anesthesia Procedure Notes (Signed)
Arterial Line Insertion Start/End5/24/2023 8:45 AM, 07/15/2021 8:55 AM Performed by: Lannie Fields, DO, anesthesiologist  Patient location: OR. Preanesthetic checklist: patient identified, IV checked, site marked, risks and benefits discussed, surgical consent, monitors and equipment checked, pre-op evaluation, timeout performed and anesthesia consent Lidocaine 1% used for infiltration Left, radial was placed Catheter size: 20 G Hand hygiene performed  and maximum sterile barriers used   Attempts: 1 Procedure performed without using ultrasound guided technique. Ultrasound Notes:anatomy identified, needle tip was noted to be adjacent to the nerve/plexus identified and no ultrasound evidence of intravascular and/or intraneural injection Following insertion, dressing applied. Post procedure assessment: normal and unchanged  Patient tolerated the procedure well with no immediate complications.

## 2021-07-15 NOTE — Progress Notes (Addendum)
When I removed the final 2 cc from patient's radial band I noticed a small hematoma, no bleeding from site itself. 2 cc added and manual pressure applied for 5 minutes. Hematoma not expanding. Patient has palpable pulses and good perfusion to right hand. Patient complaining only of pain when manual pressure applied to site. Will try remove the final 2cc again in 15 minutes and then try to remove the band. Will follow up.  1500: No expanding hematoma. Hematoma marked. Band removed and transparent dressing placed over radial site. Will continue to monitor.   1600: Dr Corliss Skains looked at patient's hematoma on wrist. Verbal order to wrap wrist with cobane and just keep an eye on it.   Sherral Hammers RN

## 2021-07-15 NOTE — Progress Notes (Addendum)
Referring Physician(s): Dr. Arlean Hopping  Supervising Physician: Luanne Bras  Patient Status:  [facility] - In-pt  Chief Complaint:  S/p balloon angioplasty with stenting of R ICA  Subjective:  Pt resting bed. He endorses intermittent nausea that resolves. He states he is tolerating clear fluids well. He denies HA or other c/o. He endorses slight tenderness to right radial puncture site.   Allergies: Other  Medications: Prior to Admission medications   Medication Sig Start Date End Date Taking? Authorizing Provider  aspirin EC 81 MG tablet Take 81 mg by mouth daily. Swallow whole.   Yes [provider]  Insulin Glargine (BASAGLAR KWIKPEN) 100 UNIT/ML Inject 30 Units into the skin at bedtime.   Yes [provider]  metFORMIN (GLUCOPHAGE XR) 500 MG 24 hr tablet Take 1 tablet (500 mg total) by mouth daily with breakfast. 05/22/20  Yes Brita Romp, NP  metoCLOPramide (REGLAN) 10 MG tablet Take 10 mg by mouth 3 (three) times daily before meals.   Yes [provider]  pantoprazole (PROTONIX) 40 MG tablet TAKE 1 TABLET BY MOUTH 2 TIMES DAILY BEFORE A MEAL 30 MINUTES BEFORE BREAKFAST AND DINNER 10/16/20  Yes Mahala Menghini, PA-C  ticagrelor (BRILINTA) 90 MG TABS tablet Take 1 tablet (90 mg total) by mouth 2 (two) times daily. 06/22/21  Yes Han, Aimee H, PA-C  blood glucose meter kit and supplies KIT Dispense based on patient and insurance preference (something with affordable test strips). Use to check glucose twice daily as directed. 08/18/20   Brita Romp, NP  glucose blood (ACCU-CHEK GUIDE) test strip Use as instructed to monitor glucose twice daily, before breakfast and before bed 05/13/20   Brita Romp, NP  Insulin Pen Needle 31G X 5 MM MISC 1 Units by Does not apply route daily. 04/07/20   Manuella Ghazi, Pratik D, DO  OneTouch Delica Lancets 58X MISC PLEASE SEE ATTACHED FOR DETAILED DIRECTIONS 04/08/20   [provider]     Vital  Signs: BP 109/72   Pulse 100   Temp 97.8 F (36.6 C) (Oral)   Resp 15   Ht 5' 8" (1.727 m)   Wt 160 lb 0.9 oz (72.6 kg)   SpO2 96%   BMI 24.34 kg/m   Physical Exam Constitutional:      General: He is not in acute distress.    Appearance: Normal appearance. He is not ill-appearing.  HENT:     Head: Normocephalic and atraumatic.  Eyes:     Extraocular Movements: Extraocular movements intact.     Pupils: Pupils are equal, round, and reactive to light.  Cardiovascular:     Rate and Rhythm: Tachycardia present.  Pulmonary:     Effort: Pulmonary effort is normal. No respiratory distress.  Musculoskeletal:     Right lower leg: No edema.     Left lower leg: No edema.  Skin:    General: Skin is warm and dry.     Comments: R radial puncture site firm with no hematoma. Minor oozing to site. RN to elevate and apply Coban pressure dressing.   Neurological:     Mental Status: He is alert and oriented to person, place, and time.     Comments: Alert, aware and oriented X 3 Speech and comprehension is intact.  PERRL bilaterally No facial droop noted Tongue midline Can spontaneously move all 4 extremities.  Hand grip strength equal bilaterally. Dorsiflexion 5/5 bilaterally. Plantar flection 5/5 bilaterally.  Negative pronator drift. Fine motor and  coordination grossly in tact Gait not assessed Romberg not assessed Heel to toe not assessed Distal pulses not assessed        Imaging: No results found.  Labs:  CBC: Recent Labs    03/19/21 0711 04/06/21 1338 04/17/21 0715 07/15/21 0710  WBC 4.6 5.3 4.5 4.2  HGB 11.7* 12.1* 10.3* 10.7*  HCT 34.2* 36.2* 31.5* 31.9*  PLT 97* 104* 97* 103*    COAGS: Recent Labs    03/19/21 0711 04/17/21 0715 07/15/21 0710  INR 0.9 0.8 0.8    BMP: Recent Labs    03/19/21 0711 04/17/21 0715 06/12/21 1200 07/15/21 0710  NA 132* 136 138 136  K 4.1 4.0 4.4 4.4  CL 98 104 103 104  CO2 _0 GLUCOSE 456* 257* 213* 154*   BUN 24* 18 29* 23*  CALCIUM 8.9 8.3* 9.3 8.9  CREATININE 1.29* 1.37* 1.59* 1.45*  GFRNONAA >60 >60 53* 59*    LIVER FUNCTION TESTS: Recent Labs    06/12/21 1200  BILITOT 0.8  AST 14*  ALT 18  ALKPHOS 97  PROT 6.6  ALBUMIN 4.0    Assessment and Plan:  S/p balloon angioplasty with stenting of R ICA  Pt resting bed. He endorses intermittent nausea that resolves. He states he is tolerating clear fluids well. He denies HA or other c/o. He endorses slight tenderness to right radial puncture site.   IV Heparin and Cleviprex infusing.   Alert, aware and oriented X 3 Speech and comprehension is intact.  PERRL bilaterally No facial droop noted Tongue midline Can spontaneously move all 4 extremities.  Hand grip strength equal bilaterally.  IR to follow.  Please call IR with questions/concerns.    Electronically Signed: Tyson Alias, NP 07/15/2021, 4:20 PM   I spent a total of 15 Minutes at the the patient's bedside AND on the patient's hospital floor or unit, greater than 50% of which was counseling/coordinating care for s/p R ICA stent.

## 2021-07-15 NOTE — Procedures (Signed)
INR Right common carotid arteriogram. Right radial approach. Findings. 1.  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   Status post balloon angioplasty with stenting of right internal carotid artery intracranial proximal cavernous segment.  Post CT of the brain no evidence of hemorrhagic complications. Extubated. Denies any headaches or nausea or vomiting. Pupils approximately 2 to 3 mm sluggish bilaterally.  No facial asymmetry.  Tongue midline.  Able to raise both arms against gravity without pronation drift.  Moves lower extremities equally.  Right radial pulse intact.  Fatima Sanger MD

## 2021-07-15 NOTE — Anesthesia Preprocedure Evaluation (Addendum)
Anesthesia Evaluation  Patient identified by MRN, date of birth, ID band Patient awake    Reviewed: Allergy & Precautions, NPO status , Patient's Chart, lab work & pertinent test results  Airway Mallampati: IV  TM Distance: >3 FB Neck ROM: Full  Mouth opening: Limited Mouth Opening  Dental no notable dental hx. (+) Poor Dentition, Chipped, Loose, Dental Advisory Given,    Pulmonary sleep apnea ,    Pulmonary exam normal breath sounds clear to auscultation       Cardiovascular hypertension (states he has never needed meds, 150/81 in preop), Normal cardiovascular exam Rhythm:Regular Rate:Normal     Neuro/Psych  Headaches, negative psych ROS   GI/Hepatic Neg liver ROS, GERD  Medicated and Controlled,  Endo/Other  diabetes, Poorly Controlled, Type 2, Insulin Dependent, Oral Hypoglycemic AgentsLast a1c 8.1  Renal/GU negative Renal ROS  negative genitourinary   Musculoskeletal  (+) Arthritis , Osteoarthritis,    Abdominal   Peds  Hematology negative hematology ROS (+)   Anesthesia Other Findings   Reproductive/Obstetrics negative OB ROS                           Anesthesia Physical Anesthesia Plan  ASA: 3  Anesthesia Plan: General   Post-op Pain Management:    Induction: Intravenous  PONV Risk Score and Plan: 2 and Ondansetron, Dexamethasone, Midazolam and Treatment may vary due to age or medical condition  Airway Management Planned: Oral ETT and Video Laryngoscope Planned  Additional Equipment: Arterial line  Intra-op Plan:   Post-operative Plan: Extubation in OR  Informed Consent: I have reviewed the patients History and Physical, chart, labs and discussed the procedure including the risks, benefits and alternatives for the proposed anesthesia with the patient or authorized representative who has indicated his/her understanding and acceptance.     Dental advisory given  Plan  Discussed with: CRNA  Anesthesia Plan Comments: (Extremely small mouth opening with very poor dentition, one very loose tooth- advised pt that this tooth may come out during laryngoscopy. Will proceed w/ glidescope d/t limited mouth opening  2 PIVs, arterial line)       Anesthesia Quick Evaluation

## 2021-07-15 NOTE — Anesthesia Procedure Notes (Signed)
Procedure Name: Intubation Date/Time: 07/15/2021 8:41 AM Performed by: Leonor Liv, CRNA Pre-anesthesia Checklist: Patient identified, Emergency Drugs available, Suction available and Patient being monitored Patient Re-evaluated:Patient Re-evaluated prior to induction Oxygen Delivery Method: Circle System Utilized Preoxygenation: Pre-oxygenation with 100% oxygen Induction Type: IV induction Ventilation: Mask ventilation without difficulty and Oral airway inserted - appropriate to patient size Laryngoscope Size: Glidescope and 4 Grade View: Grade II Tube type: Oral Tube size: 7.5 mm Number of attempts: 1 Airway Equipment and Method: Oral airway, Rigid stylet and Video-laryngoscopy Placement Confirmation: ETT inserted through vocal cords under direct vision, positive ETCO2 and breath sounds checked- equal and bilateral Secured at: 22 cm Tube secured with: Tape (left) Dental Injury: Teeth and Oropharynx as per pre-operative assessment  Difficulty Due To: Difficulty was anticipated and Difficult Airway- due to dentition Comments: Loose teeth, small mouth opening, facial hair. Elected glidescope for intubation

## 2021-07-16 ENCOUNTER — Ambulatory Visit: Payer: Self-pay | Admitting: *Deleted

## 2021-07-16 ENCOUNTER — Other Ambulatory Visit: Payer: Self-pay

## 2021-07-16 ENCOUNTER — Emergency Department (HOSPITAL_COMMUNITY): Payer: Medicaid - Out of State

## 2021-07-16 ENCOUNTER — Emergency Department (HOSPITAL_COMMUNITY)
Admission: EM | Admit: 2021-07-16 | Discharge: 2021-07-17 | Disposition: A | Discharge status: Home / Self Care | Payer: Medicaid - Out of State | Attending: Emergency Medicine | Admitting: Emergency Medicine

## 2021-07-16 ENCOUNTER — Encounter (HOSPITAL_COMMUNITY): Payer: Self-pay | Admitting: Interventional Radiology

## 2021-07-16 DIAGNOSIS — R1114 Bilious vomiting: Secondary | ICD-10-CM | POA: Insufficient documentation

## 2021-07-16 DIAGNOSIS — R519 Headache, unspecified: Secondary | ICD-10-CM | POA: Insufficient documentation

## 2021-07-16 DIAGNOSIS — R000 Tachycardia, unspecified: Secondary | ICD-10-CM | POA: Diagnosis not present

## 2021-07-16 DIAGNOSIS — Z7982 Long term (current) use of aspirin: Secondary | ICD-10-CM | POA: Insufficient documentation

## 2021-07-16 DIAGNOSIS — E119 Type 2 diabetes mellitus without complications: Secondary | ICD-10-CM | POA: Insufficient documentation

## 2021-07-16 DIAGNOSIS — Z7984 Long term (current) use of oral hypoglycemic drugs: Secondary | ICD-10-CM | POA: Diagnosis not present

## 2021-07-16 DIAGNOSIS — Z794 Long term (current) use of insulin: Secondary | ICD-10-CM | POA: Insufficient documentation

## 2021-07-16 DIAGNOSIS — I10 Essential (primary) hypertension: Secondary | ICD-10-CM | POA: Diagnosis not present

## 2021-07-16 DIAGNOSIS — R112 Nausea with vomiting, unspecified: Secondary | ICD-10-CM | POA: Diagnosis present

## 2021-07-16 LAB — BASIC METABOLIC PANEL
Anion gap: 9 (ref 5–15)
Anion gap: 10 (ref 5–15)
BUN: 15 mg/dL (ref 6–20)
BUN: 22 mg/dL — ABNORMAL HIGH (ref 6–20)
CO2: 23 mmol/L (ref 22–32)
CO2: 20 mmol/L — ABNORMAL LOW (ref 22–32)
Calcium: 8.1 mg/dL — ABNORMAL LOW (ref 8.9–10.3)
Calcium: 8.4 mg/dL — ABNORMAL LOW (ref 8.9–10.3)
Chloride: 105 mmol/L (ref 98–111)
Chloride: 107 mmol/L (ref 98–111)
Creatinine, Ser: 1.07 mg/dL (ref 0.61–1.24)
Creatinine, Ser: 1.41 mg/dL — ABNORMAL HIGH (ref 0.61–1.24)
GFR, Estimated: >60 mL/min (ref >60)
GFR, Estimated: >60 mL/min (ref >60)
Glucose, Bld: 160 mg/dL — ABNORMAL HIGH (ref 70–99)
Glucose, Bld: 230 mg/dL — ABNORMAL HIGH (ref 70–99)
Potassium: 3.6 mmol/L (ref 3.5–5.1)
Potassium: 4.5 mmol/L (ref 3.5–5.1)
Sodium: 137 mmol/L (ref 135–145)
Sodium: 137 mmol/L (ref 135–145)

## 2021-07-16 LAB — CBC WITH DIFFERENTIAL/PLATELET
Abs Immature Granulocytes: 0.01 10*3/uL (ref 0.00–0.07)
Abs Immature Granulocytes: 0.04 10*3/uL (ref 0.00–0.07)
Basophils Absolute: 0 10*3/uL (ref 0.0–0.1)
Basophils Absolute: 0 10*3/uL (ref 0.0–0.1)
Basophils Relative: 0 %
Basophils Relative: 0 %
Eosinophils Absolute: 0 10*3/uL (ref 0.0–0.5)
Eosinophils Absolute: 0 10*3/uL (ref 0.0–0.5)
Eosinophils Relative: 1 %
Eosinophils Relative: 0 %
HCT: 27.6 % — ABNORMAL LOW (ref 39.0–52.0)
HCT: 33.6 % — ABNORMAL LOW (ref 39.0–52.0)
Hemoglobin: 9.5 g/dL — ABNORMAL LOW (ref 13.0–17.0)
Hemoglobin: 10.8 g/dL — ABNORMAL LOW (ref 13.0–17.0)
Immature Granulocytes: 0 %
Immature Granulocytes: 1 %
Lymphocytes Relative: 28 %
Lymphocytes Relative: 9 %
Lymphs Abs: 1.1 10*3/uL (ref 0.7–4.0)
Lymphs Abs: 0.7 10*3/uL (ref 0.7–4.0)
MCH: 27.9 pg (ref 26.0–34.0)
MCH: 27.2 pg (ref 26.0–34.0)
MCHC: 34.4 g/dL (ref 30.0–36.0)
MCHC: 32.1 g/dL (ref 30.0–36.0)
MCV: 80.9 fL (ref 80.0–100.0)
MCV: 84.6 fL (ref 80.0–100.0)
Monocytes Absolute: 0.2 10*3/uL (ref 0.1–1.0)
Monocytes Absolute: 0.3 10*3/uL (ref 0.1–1.0)
Monocytes Relative: 6 %
Monocytes Relative: 4 %
Neutro Abs: 2.6 10*3/uL (ref 1.7–7.7)
Neutro Abs: 6 10*3/uL (ref 1.7–7.7)
Neutrophils Relative %: 65 %
Neutrophils Relative %: 86 %
Platelets: 87 10*3/uL — ABNORMAL LOW (ref 150–400)
Platelets: 100 10*3/uL — ABNORMAL LOW (ref 150–400)
RBC: 3.41 MIL/uL — ABNORMAL LOW (ref 4.22–5.81)
RBC: 3.97 MIL/uL — ABNORMAL LOW (ref 4.22–5.81)
RDW: 13.1 % (ref 11.5–15.5)
RDW: 13.2 % (ref 11.5–15.5)
WBC: 4 10*3/uL (ref 4.0–10.5)
WBC: 7 10*3/uL (ref 4.0–10.5)
nRBC: 0 % (ref 0.0–0.2)
nRBC: 0 % (ref 0.0–0.2)

## 2021-07-16 LAB — GLUCOSE, CAPILLARY: Glucose-Capillary: 213 mg/dL — ABNORMAL HIGH (ref 70–99)

## 2021-07-16 IMAGING — CT CT ANGIO HEAD-NECK (W OR W/O PERF)
2 of 11 series · 7 of 33 positions shown · IV contrast (APPLIED)
Comparison: [DATE] CTA head

CLINICAL DATA: Status post recent stent placement, complaining of
emesis and headache

EXAM:
CT ANGIOGRAPHY HEAD AND NECK
TECHNIQUE: Multidetector CT imaging of the head and neck was performed using
the standard protocol during bolus administration of intravenous
contrast. Multiplanar CT image reconstructions and MIPs were
obtained to evaluate the vascular anatomy. Carotid stenosis
measurements (when applicable) are obtained utilizing NASCET
criteria, using the distal internal carotid diameter as the
denominator.

[Series 6: cta neck/head · axial · 0.38mm/px · z∈[-198,-64]mm · 2 of 209 slices shown]
[im 70/209  soft-tissue]
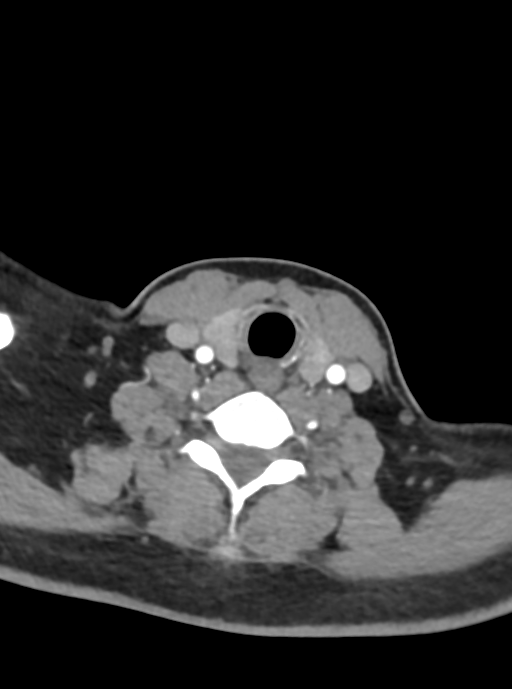
[im 139/209  bone]
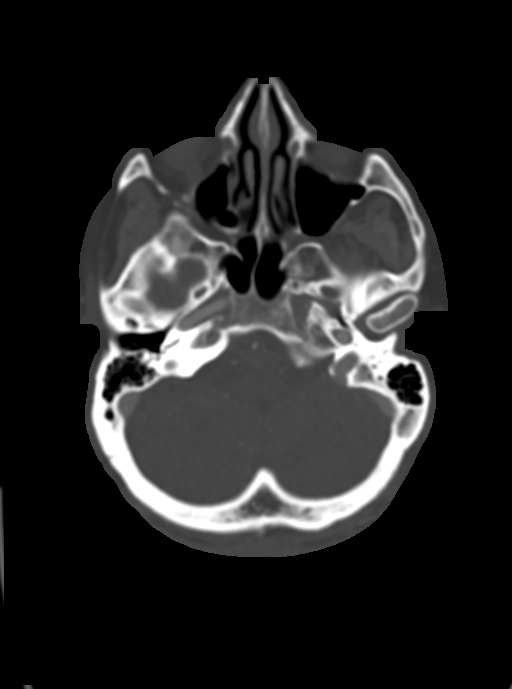

[Series 12: ax thins · axial · 0.36mm/px · z∈[-214,+39]mm · 5 of 381 slices shown]
[im 64/381  soft-tissue]
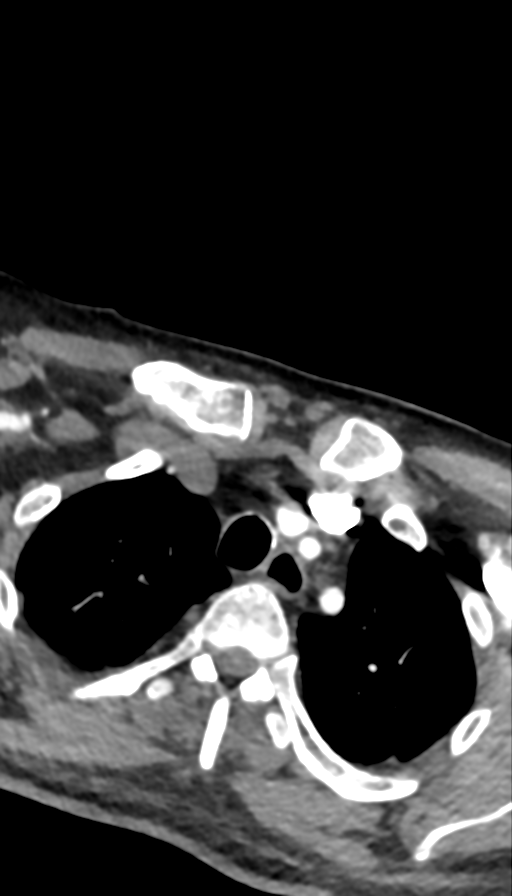
[im 127/381  soft-tissue]
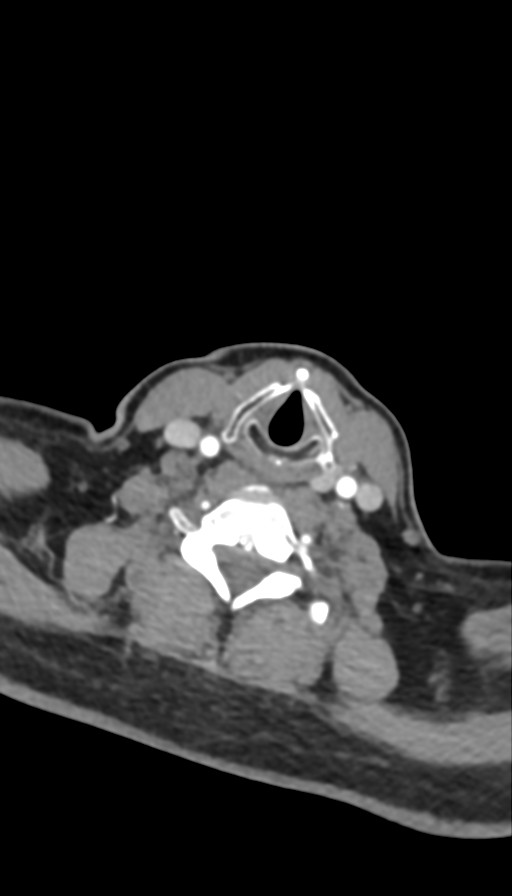
[im 191/381  soft-tissue]
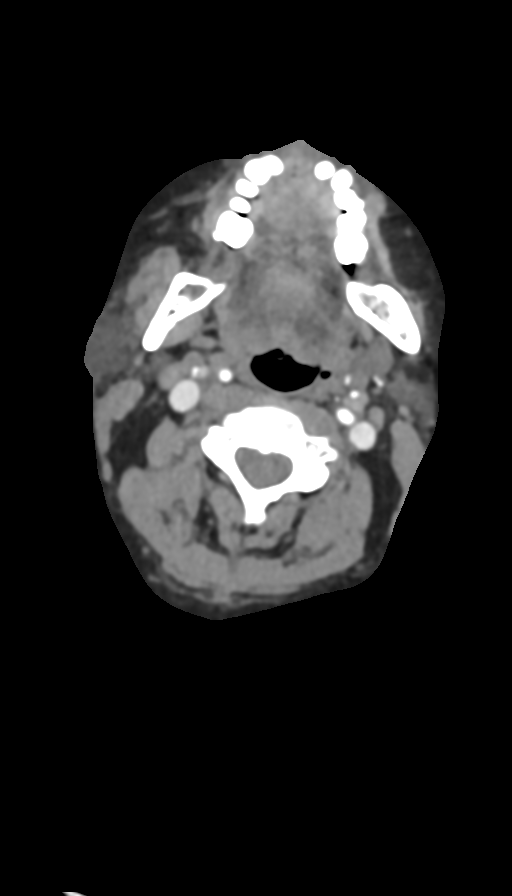
[im 254/381  soft-tissue]
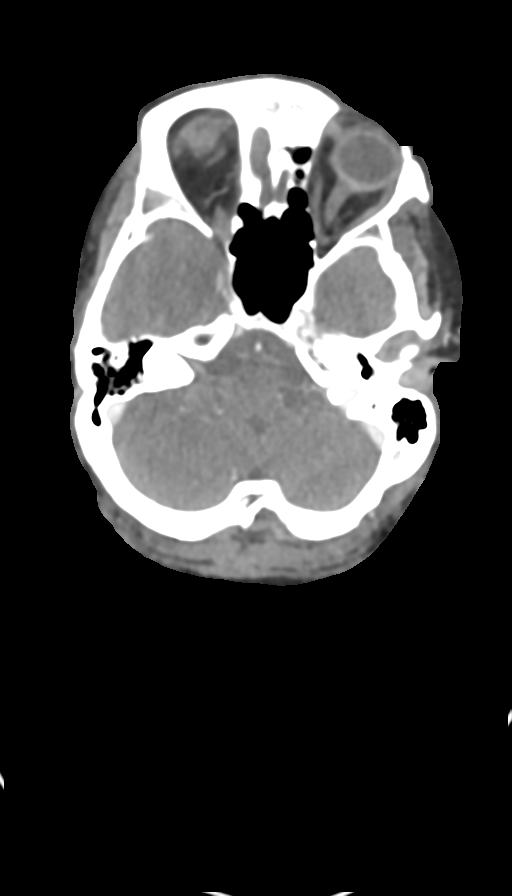
[im 317/381  soft-tissue]
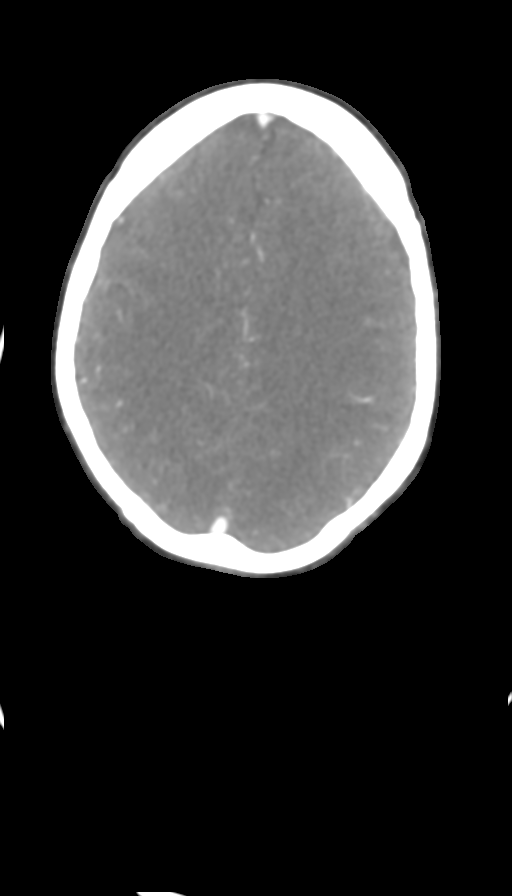

[7 of 33 positions shown; findings below may reference images not displayed]

RADIATION DOSE REDUCTION: This exam was performed according to the
departmental dose-optimization program which includes automated
exposure control, adjustment of the mA and/or kV according to
patient size and/or use of iterative reconstruction technique.

CONTRAST:  75mL OMNIPAQUE IOHEXOL 350 MG/ML SOLN
FINDINGS: CT HEAD FINDINGS

Brain: No evidence of acute infarction, hemorrhage, cerebral edema,
mass, mass effect, or midline shift. No hydrocephalus or extra-axial
fluid collection. Redemonstrated 8 mm cavernoma in the right
parietal white matter adjacent to the right lateral ventricle
(series 8, image 18).

Vascular: No hyperdense vessel.

Skull: Normal. Negative for fracture or focal lesion.

Sinuses/Orbits: Minimal mucosal thickening in the maxillary sinuses.
Orbits are unremarkable.

Other: The mastoid air cells are well aerated.

CTA NECK FINDINGS

Aortic arch: Standard branching. Imaged portion shows no evidence of
aneurysm or dissection. No significant stenosis of the major arch
vessel origins. Aortic atherosclerosis.

Right carotid system: Approximate 50% stenosis of the proximal right
ICA secondary to noncalcified plaque. No evidence of dissection or
occlusion.

Left carotid system: Approximate 50% stenosis of the proximal left
ICA secondary to calcified plaque. No occlusion or dissection.

Vertebral arteries: No evidence of dissection, occlusion, or
hemodynamically significant stenosis (greater than 50%).

Skeleton: No acute osseous abnormality. Poor dentition with multiple
periapical lucencies.

Other neck: No acute finding.

Upper chest: No focal pulmonary opacity or pleural effusion.

Review of the MIP images confirms the above findings

CTA HEAD FINDINGS

Anterior circulation: Both internal carotid arteries are patent to
the termini, without significant stenosis. Status post interval
stenting of the proximal right cavernous ICA; evaluation for patency
is limited by beam hardening artifact from the stent, however there
does appear to be contrast within the stent and the distal right
cavernous ICA is patent.

A1 segments patent, with a hypoplastic right A1. Normal anterior
communicating artery. Anterior cerebral arteries are patent to their
distal aspects.

No M1 stenosis or occlusion. Normal MCA bifurcations. Distal MCA
branches perfused and symmetric.

Posterior circulation: Vertebral arteries patent to the
vertebrobasilar junction without stenosis. Posterior inferior
cerebellar arteries patent proximally.

Basilar patent to its distal aspect. Superior cerebellar arteries
patent proximally.

Patent right P1. Fetal origin of the left PCA from a patent left
posterior communicating artery. The right posterior communicating
artery is also patent. PCAs perfused to their distal aspects without
stenosis.

Venous sinuses: As permitted by contrast timing, patent.

Anatomic variants: None significant.

Review of the MIP images confirms the above findings
IMPRESSION: 1. Status post interval stenting of the proximal right cavernous
ICA. Evaluation for patency is limited by beam hardening artifact;
however, there does appear to be contrast within the stent, and the
right ICA distal to the stent is patent.
2.  No intracranial large vessel occlusion or significant stenosis.
3. Approximately 50% stenosis in the bilateral proximal ICAs. No
other hemodynamically significant stenosis in the neck.
4. No acute intracranial process on the noncontrast head CT.
Unchanged right parietal cavernoma.

## 2021-07-16 MED ORDER — SODIUM CHLORIDE 0.9 % IV BOLUS: 1000.0000 mL | Freq: Once | INTRAVENOUS | Status: DC

## 2021-07-16 MED ORDER — PROCHLORPERAZINE EDISYLATE 10 MG/2ML IJ SOLN
5.0000 mg | Freq: Once | INTRAMUSCULAR | Status: AC
  Administered 2021-07-16: 5 mg via INTRAVENOUS
  Filled 2021-07-16: qty 2

## 2021-07-16 MED ORDER — SODIUM CHLORIDE 0.9 % IV BOLUS
1000.0000 mL | Freq: Once | INTRAVENOUS | Status: AC
  Administered 2021-07-16: 1000 mL via INTRAVENOUS

## 2021-07-16 MED ORDER — IOHEXOL 350 MG/ML SOLN
75.0000 mL | Freq: Once | INTRAVENOUS | Status: AC | PRN
  Administered 2021-07-16: 75 mL via INTRAVENOUS

## 2021-07-16 MED ORDER — ONDANSETRON HCL 4 MG/2ML IJ SOLN
4.0000 mg | Freq: Once | INTRAMUSCULAR | Status: AC
  Administered 2021-07-16: 4 mg via INTRAVENOUS
  Filled 2021-07-16: qty 2

## 2021-07-16 MED ORDER — ONDANSETRON 4 MG PO TBDP
4.0000 mg | ORAL_TABLET | Freq: Three times a day (TID) | ORAL | 0 refills | Status: DC | PRN
Start: 2021-07-16 — End: 2021-07-17

## 2021-07-16 NOTE — ED Provider Notes (Signed)
[facility] EMERGENCY DEPARTMENT Provider Note   CSN: [id number] Arrival date & time: 07/16/21  1832     History  Chief Complaint  Patient presents with   Emesis    Jose Koch is a 51 y.o. male.  HPI Patient presents same day as being discharged from this facility now with concern for nausea, vomiting. He is here with his girlfriend's mother who assists with the history. Patient has multiple medical issues including diabetes, hypertension, aneurysm and had right ICA stent placed yesterday.  He notes that on discharge he is doing generally well, but developed nausea, vomiting soon thereafter.    Home Medications Prior to Admission medications   Medication Sig Start Date End Date Taking? Authorizing Provider  ondansetron (ZOFRAN-ODT) 4 MG disintegrating tablet Take 1 tablet (4 mg total) by mouth every 8 (eight) hours as needed for nausea or vomiting. 07/16/21  Yes Carmin Muskrat, MD  aspirin EC 81 MG tablet Take 81 mg by mouth daily. Swallow whole.    [provider]  blood glucose meter kit and supplies KIT Dispense based on patient and insurance preference (something with affordable test strips). Use to check glucose twice daily as directed. 08/18/20   Brita Romp, NP  glucose blood (ACCU-CHEK GUIDE) test strip Use as instructed to monitor glucose twice daily, before breakfast and before bed 05/13/20   Brita Romp, NP  Insulin Glargine (BASAGLAR KWIKPEN) 100 UNIT/ML Inject 30 Units into the skin at bedtime.    [provider]  Insulin Pen Needle 31G X 5 MM MISC 1 Units by Does not apply route daily. 04/07/20   Manuella Ghazi, Pratik D, DO  metFORMIN (GLUCOPHAGE XR) 500 MG 24 hr tablet Take 1 tablet (500 mg total) by mouth daily with breakfast. 05/22/20   Brita Romp, NP  metoCLOPramide (REGLAN) 10 MG tablet Take 10 mg by mouth 3 (three) times daily before meals.    [provider]  OneTouch Delica Lancets 35D MISC PLEASE SEE  ATTACHED FOR DETAILED DIRECTIONS 04/08/20   [provider]  pantoprazole (PROTONIX) 40 MG tablet TAKE 1 TABLET BY MOUTH 2 TIMES DAILY BEFORE A MEAL 30 MINUTES BEFORE BREAKFAST AND DINNER 10/16/20   Mahala Menghini, PA-C  ticagrelor (BRILINTA) 90 MG TABS tablet Take 1 tablet (90 mg total) by mouth 2 (two) times daily. 06/22/21   Han, Aimee H, PA-C      Allergies    Other    Review of Systems   Review of Systems  All other systems reviewed and are negative.  Physical Exam Updated Vital Signs BP (!) 165/90   Pulse (!) 109   Temp 98.9 F (37.2 C) (Oral)   Resp (!) 21   Ht _0  (1.727 m)   Wt 72.6 kg   SpO2 97%   BMI 24.33 kg/m  Physical Exam Vitals and nursing note reviewed.  Constitutional:      General: He is not in acute distress.    Appearance: He is well-developed.  HENT:     Head: Normocephalic and atraumatic.  Eyes:     Conjunctiva/sclera: Conjunctivae normal.  Cardiovascular:     Rate and Rhythm: Normal rate and regular rhythm.  Pulmonary:     Effort: Pulmonary effort is normal. No respiratory distress.     Breath sounds: No stridor.  Abdominal:     General: There is no distension.  Skin:    General: Skin is warm and dry.     Comments: Stigmata of  ichthyosis vulgaris  Neurological:     Mental Status: He is alert and oriented to person, place, and time.     Cranial Nerves: No dysarthria.     Motor: No weakness, tremor or atrophy.    ED Results / Procedures / Treatments   Labs (all labs ordered are listed, but only abnormal results are displayed) Labs Reviewed  CBC WITH DIFFERENTIAL/PLATELET - Abnormal; Notable for the following components:      Result Value   RBC 3.97 (*)    Hemoglobin 10.8 (*)    HCT 33.6 (*)    Platelets 100 (*)    All other components within normal limits  BASIC METABOLIC PANEL - Abnormal; Notable for the following components:   CO2 20 (*)    Glucose, Bld 230 (*)    BUN 22 (*)    Creatinine, Ser 1.41 (*)    Calcium 8.4 (*)     All other components within normal limits    EKG None  Radiology CT ANGIO HEAD NECK W WO CM  Result Date: 07/16/2021 CLINICAL DATA:  Status post recent stent placement, complaining of emesis and headache EXAM: CT ANGIOGRAPHY HEAD AND NECK TECHNIQUE: Multidetector CT imaging of the head and neck was performed using the standard protocol during bolus administration of intravenous contrast. Multiplanar CT image reconstructions and MIPs were obtained to evaluate the vascular anatomy. Carotid stenosis measurements (when applicable) are obtained utilizing NASCET criteria, using the distal internal carotid diameter as the denominator. RADIATION DOSE REDUCTION: This exam was performed according to the departmental dose-optimization program which includes automated exposure control, adjustment of the mA and/or kV according to patient size and/or use of iterative reconstruction technique. CONTRAST:  70m OMNIPAQUE IOHEXOL 350 MG/ML SOLN COMPARISON:  02/03/2021 CTA head FINDINGS: CT HEAD FINDINGS Brain: No evidence of acute infarction, hemorrhage, cerebral edema, mass, mass effect, or midline shift. No hydrocephalus or extra-axial fluid collection. 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 Vascular: No hyperdense vessel. Skull: Normal. Negative for fracture or focal lesion. Sinuses/Orbits: Minimal mucosal thickening in the maxillary sinuses. Orbits are unremarkable. Other: The mastoid air cells are well aerated. CTA NECK FINDINGS Aortic arch: Standard branching. Imaged portion shows no evidence of aneurysm or dissection. No significant stenosis of the major arch vessel origins. Aortic atherosclerosis. Right carotid system: Approximate 50% stenosis of the proximal right ICA secondary to noncalcified plaque. No evidence of dissection or occlusion. Left carotid system: Approximate 50% stenosis of the proximal left ICA secondary to  calcified plaque. No occlusion or dissection. Vertebral arteries: No evidence of dissection, occlusion, or hemodynamically significant stenosis (greater than 50%). Skeleton: No acute osseous abnormality. Poor dentition with multiple periapical lucencies. Other neck: No acute finding. Upper chest: No focal pulmonary opacity or pleural effusion. Review of the MIP images confirms the above findings CTA HEAD FINDINGS Anterior circulation: Both internal carotid arteries are patent to the termini, without significant stenosis. Status post interval stenting of the proximal right cavernous ICA; evaluation for patency is limited by beam hardening artifact from the stent, however there does appear to be contrast within the stent and the distal right cavernous ICA is patent. A1 segments patent, with a hypoplastic right A1. Normal anterior communicating artery. Anterior cerebral arteries are patent to their distal aspects. No M1 stenosis or occlusion. Normal MCA bifurcations. Distal MCA branches perfused and symmetric. Posterior circulation: Vertebral arteries patent to the vertebrobasilar junction without stenosis. Posterior inferior cerebellar  arteries patent proximally. Basilar patent to its distal aspect. Superior cerebellar arteries patent proximally. Patent right P1. Fetal origin of the left PCA from a patent left posterior communicating artery. The right posterior communicating artery is also patent. PCAs perfused to their distal aspects without stenosis. Venous sinuses: As permitted by contrast timing, patent. Anatomic variants: None significant. Review of the MIP images confirms the above findings IMPRESSION: 1. Status post interval stenting of the proximal right cavernous ICA. Evaluation for patency is limited by beam hardening artifact; however, there does appear to be contrast within the stent, and the right ICA distal to the stent is patent. 2.  No intracranial large vessel occlusion or significant stenosis. 3.  Approximately 50% stenosis in the bilateral proximal ICAs. No other hemodynamically significant stenosis in the neck. 4. No acute intracranial process on the noncontrast head CT. Unchanged right parietal cavernoma. Electronically Signed   By: Merilyn Baba M.D.   On: 07/16/2021 23:54    Procedures Procedures    Medications Ordered in ED Medications  sodium chloride 0.9 % bolus 1,000 mL (has no administration in time range)  ondansetron (ZOFRAN) injection 4 mg (4 mg Intravenous Given 07/16/21 1921)  sodium chloride 0.9 % bolus 1,000 mL (1,000 mLs Intravenous New Bag/Given 07/16/21 2200)  prochlorperazine (COMPAZINE) injection 5 mg (5 mg Intravenous Given 07/16/21 2157)  iohexol (OMNIPAQUE) 350 MG/ML injection 75 mL (75 mLs Intravenous Contrast Given 07/16/21 2229)    ED Course/ Medical Decision Making/ A&P This patient with a Hx of multiple medical issues including nausea, vomiting, hypertension, diabetes and recent intracranial stent presents to the ED for concern of nausea and vomiting, this involves an extensive number of treatment options, and is a complaint that carries with it a high risk of complications and morbidity.    The differential diagnosis includes acute on chronic nausea, vomiting versus reaction to anesthesia versus complication of intracranial stent   Social Determinants of Health:  As above, multiple medical problems  Additional history obtained:  Additional history and/or information obtained from mother-in-law, interventional radiologist, notable for history of illness, and details of procedure   After the initial evaluation, orders, including: Labs CT angiography from triage were initiated.  Compazine, fluids   Patient placed on Cardiac and Pulse-Oximetry Monitors. The patient was maintained on a cardiac monitor.  The cardiac monitored showed an rhythm of 100 sinus rhythm sinus tach borderline The patient was also maintained on pulse oximetry. The readings were  typically 99% room air normal   On repeat evaluation of the patient improved Lab Tests:  I personally interpreted labs.  The pertinent results include: Mild hyperglycemia, persistent demonstration of renal dysfunction  Imaging Studies ordered:  I independently visualized and interpreted imaging which showed no intracranial hemorrhage, n stent in place I agree with the radiologist interpretation  Consultations Obtained:  I requested consultation with the patient's interventional neuroradiologist, we discussed his presentation, history,  and discussed lab and imaging findings as well as pertinent plan - they recommend: Resuscitation with fluids, antiemetics, discharge if patient improves  Dispostion / Final MDM:  After consideration of the diagnostic results and the patient's response to treatment, this adult male with history of multiple medical issues including nausea, vomiting, diabetes, hypertension, recent stent of right ICA presenting with nausea, vomiting.  Here patient is mildly tachycardic, but not hypotensive, vomiting improves, labs are consistent with multiple prior studies, CT angiography, reviewed, reassuring.  Patient appropriate for discharge with outpatient follow-up per his interventional neuroradiologist.  Patient, girlfriend's mother  who accompanies him, both comfortable with plan.  Final Clinical Impression(s) / ED Diagnoses Final diagnoses:  Bilious vomiting with nausea    Rx / DC Orders ED Discharge Orders          Ordered    ondansetron (ZOFRAN-ODT) 4 MG disintegrating tablet  Every 8 hours PRN        07/16/21 2356              Carmin Muskrat, MD 07/16/21 2357

## 2021-07-16 NOTE — ED Notes (Signed)
Patient transported to CT 

## 2021-07-16 NOTE — Discharge Summary (Signed)
Physician Discharge Summary      Patient ID: Jose Koch MRN: [id number] DOB/AGE: 1970/05/01 51 y.o.  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   Discharged Condition: good  Hospital Course:  Admitted 5/24 post stent angioplasty of right intracranial ICA stenosis. NO immediate complications. (R)radial artery approach. Feels well POD #1, mild right sided HA, no vision changes. Neuro exam intact. Stable for discharge. All medications, restrictions, and follow up reviewed.  Consults: None   Discharge Exam: Blood pressure 111/72, pulse 91, temperature 98.4 F (36.9 C), temperature source Oral, resp. rate 18, height $RemoveBe'5\' 8"'blicNiCbZ$  (6.546 m), weight 72.6 kg, SpO2 96 %. NAD  Lungs: CTA without w/r/r Heart: Regular Ext: (R)radial site with very slow skin ooze, excellent radial pulse, brisk cap refill Neuro: A&O x 3 Face symmetric, tongue midline PERRLA, EMOI No drift Fine motor intact bilat Strength 5/5 UE and LE bilat.   Disposition: Discharge disposition: 01-Home or Self Care       Discharge Instructions     Call MD for:  difficulty breathing, headache or visual disturbances   Complete by: As directed    Call MD for:  persistant nausea and vomiting   Complete by: As directed    Call MD for:  redness, tenderness, or signs of infection (pain, swelling, redness, odor or green/yellow discharge around incision site)   Complete by: As directed    Call MD for:  severe uncontrolled pain   Complete by: As directed    Call MD for:  temperature >100.4   Complete by: As directed    Diet Carb Modified   Complete by:  As directed    Driving Restrictions   Complete by: As directed    Avoid driving if possible for 1 week   Increase activity slowly   Complete by: As directed       Allergies as of 07/16/2021       Reactions   Other    Marijuana - headaches and dizziness         Medication List     TAKE these medications    Accu-Chek Guide test strip Generic drug: glucose blood Use as instructed to monitor glucose twice daily, before breakfast and before bed   aspirin EC 81 MG tablet Take 81 mg by mouth daily. Swallow whole.   Basaglar KwikPen 100 UNIT/ML Inject 30 Units into the skin at bedtime.   blood glucose meter kit and supplies Kit Dispense based on patient and insurance preference (something with affordable test strips). Use to check glucose twice daily as directed.   Insulin Pen Needle 31G X 5 MM Misc 1 Units by Does not apply route daily.   metFORMIN 500 MG 24 hr tablet Commonly known as: Glucophage XR Take 1 tablet (500 mg total) by mouth daily with breakfast.   metoCLOPramide 10 MG tablet Commonly known as: REGLAN Take 10 mg by mouth 3 (three) times daily before meals.   OneTouch Delica Lancets 50P Misc PLEASE SEE ATTACHED FOR DETAILED DIRECTIONS   pantoprazole 40 MG tablet Commonly known as: PROTONIX TAKE 1 TABLET BY MOUTH 2 TIMES DAILY BEFORE A MEAL 30 MINUTES BEFORE BREAKFAST AND DINNER   ticagrelor 90 MG Tabs tablet  Commonly known as: Brilinta Take 1 tablet (90 mg total) by mouth 2 (two) times daily.        Follow-up Information     Luanne Bras, MD Follow up.   Specialties: Interventional Radiology, Radiology Why: Clinic will call you to set up 2 week follow up Contact information: [address] [place],   [address] [place] [address] [phone]                 Signed: Ascencion Dike PA-C 07/16/2021, 9:18 AM

## 2021-07-16 NOTE — Telephone Encounter (Signed)
  Chief Complaint: vomiting since discharged from hospital after stent placement s/p stenosis of intracranial portions of right internal carotid artery Symptoms: vomiting x 5 since waking up , headache, discharged from hospital today  Frequency: 25 minutes ago  Pertinent Negatives: Patient denies dizziness, worsening headache,  Disposition: [x] ED /[] Urgent Care (no appt availability in office) / [] Appointment(In office/virtual)/ []  [facility]/ [] Home Care/ [] Refused Recommended Disposition /[] [facility]/ []  Follow-up with PCP Additional Notes:   Na  Reason for Disposition  Recent head injury (within last 3 days)  Answer Assessment - Initial Assessment Questions 1. VOMITING SEVERITY: "How many times have you vomited in the past 24 hours?"     - MILD:  1 - 2 times/day    - MODERATE: 3 - 5 times/day, decreased oral intake without significant weight loss or symptoms of dehydration    - SEVERE: 6 or more times/day, vomits everything or nearly everything, with significant weight loss, symptoms of dehydration      4 times  2. ONSET: "When did the vomiting begin?"      Today 25 minutes ago  3. FLUIDS: "What fluids or food have you vomited up today?" "Have you been able to keep any fluids down?"     No  4. ABDOMINAL PAIN: "Are your having any abdominal pain?" If yes : "How bad is it and what does it feel like?" (e.g., crampy, dull, intermittent, constant)      Na  5. DIARRHEA: "Is there any diarrhea?" If Yes, ask: "How many times today?"      na 6. CONTACTS: "Is there anyone else in the family with the same symptoms?"      S/p stent placement and discharged from hospital today  7. CAUSE: "What do you think is causing your vomiting?"     Not sure  8. HYDRATION STATUS: "Any signs of dehydration?" (e.g., dry mouth [not only dry lips], too weak to stand) "When did you last urinate?"     na 9. OTHER SYMPTOMS: "Do you have any other symptoms?" (e.g., fever, headache, vertigo,  vomiting blood or coffee grounds, recent head injury)     Headache, recent surgery to head stent placed vomiting x 5 thus far and started 25 minutes ago  10. PREGNANCY: "Is there any chance you are pregnant?" "When was your last menstrual period?"       na  Protocols used: Vomiting-A-AH

## 2021-07-16 NOTE — Discharge Instructions (Signed)
As discussed, your evaluation today has been largely reassuring.  But, it is important that you monitor your condition carefully, and do not hesitate to return to the ED if you develop new, or concerning changes in your condition. ? ?Otherwise, please follow-up with your physician for appropriate ongoing care. ? ?

## 2021-07-16 NOTE — ED Triage Notes (Signed)
Pt arrived POV from home c/o emesis that started around 5pm and a headache. Pt had a stent placed in his brain yesterday and was discharged this morning around 1130. They called the on call number and was told to come here.

## 2021-07-16 NOTE — ED Provider Triage Note (Signed)
Emergency Medicine Provider Triage Evaluation Note  Hence Jose Koch , a 51 y.o. male  was evaluated in triage.  Pt complains of headache, nausea, vomiting.  Patient internal carotid stent placed yesterday.  Was discharged this morning.  Review of Systems  Positive:  Negative: See above  Physical Exam  BP (!) 156/87   Pulse 100   Temp 98.9 F (37.2 C) (Oral)   Resp 16   Ht 5\' 8"  (1.727 m)   Wt 72.6 kg   SpO2 94%   BMI 24.33 kg/m  Gen:   Awake, no distress   Resp:  Normal effort  MSK:   Moves extremities without difficulty  Other:    Medical Decision Making  Medically screening exam initiated at 7:07 PM.  Appropriate orders placed.  Tyray Proch was informed that the remainder of the evaluation will be completed by another provider, this initial triage assessment does not replace that evaluation, and the importance of remaining in the ED until their evaluation is complete.     Joseph Pierini [place], [place] 07/16/21 1908

## 2021-07-17 ENCOUNTER — Other Ambulatory Visit (HOSPITAL_COMMUNITY): Payer: Self-pay | Admitting: Interventional Radiology

## 2021-07-17 DIAGNOSIS — I6521 Occlusion and stenosis of right carotid artery: Secondary | ICD-10-CM

## 2021-07-17 HISTORY — PX: IR ANGIO INTRA EXTRACRAN SEL INTERNAL CAROTID UNI R MOD SED: IMG5362

## 2021-07-17 MED ORDER — ONDANSETRON 4 MG PO TBDP: 4.0000 mg | ORAL_TABLET | Freq: Three times a day (TID) | ORAL | 0 refills | Status: DC | PRN

## 2021-07-17 NOTE — ED Notes (Signed)
Discharge instructions reviewed with patient. Patient verbalized understanding of instructions. Follow-up care and medications were reviewed. Patient ambulatory with steady gait. VSS upon discharge.  ?

## 2021-07-22 ENCOUNTER — Other Ambulatory Visit: Payer: Self-pay | Admitting: Radiology

## 2021-07-22 MED ORDER — TICAGRELOR 90 MG PO TABS: 90.0000 mg | ORAL_TABLET | Freq: Two times a day (BID) | ORAL | 5 refills | Status: DC

## 2021-07-23 ENCOUNTER — Other Ambulatory Visit (HOSPITAL_COMMUNITY): Payer: Self-pay | Admitting: Interventional Radiology

## 2021-07-23 DIAGNOSIS — I6521 Occlusion and stenosis of right carotid artery: Secondary | ICD-10-CM

## 2021-07-29 ENCOUNTER — Encounter (HOSPITAL_COMMUNITY): Payer: Medicaid - Out of State

## 2021-07-29 ENCOUNTER — Ambulatory Visit: Payer: Medicaid - Out of State
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
## 2021-07-30 ENCOUNTER — Encounter: Payer: Self-pay | Admitting: Neurology

## 2021-07-30 ENCOUNTER — Ambulatory Visit (INDEPENDENT_AMBULATORY_CARE_PROVIDER_SITE_OTHER): Payer: Medicaid Other | Admitting: Neurology

## 2021-07-30 ENCOUNTER — Ambulatory Visit (HOSPITAL_COMMUNITY)
Admission: RE | Admit: 2021-07-30 | Discharge: 2021-07-30 | Disposition: A | Discharge status: Home / Self Care | Payer: Medicaid - Out of State | Source: Ambulatory Visit | Attending: Interventional Radiology | Admitting: Interventional Radiology

## 2021-07-30 VITALS — BP 84/61 | HR 101 | Ht 68.0 in | Wt 148.8 lb

## 2021-07-30 DIAGNOSIS — I6521 Occlusion and stenosis of right carotid artery: Secondary | ICD-10-CM | POA: Diagnosis not present

## 2021-07-30 DIAGNOSIS — R471 Dysarthria and anarthria: Secondary | ICD-10-CM | POA: Diagnosis not present

## 2021-07-30 DIAGNOSIS — R569 Unspecified convulsions: Secondary | ICD-10-CM | POA: Diagnosis not present

## 2021-07-30 DIAGNOSIS — I959 Hypotension, unspecified: Secondary | ICD-10-CM

## 2021-07-30 DIAGNOSIS — Q283 Other malformations of cerebral vessels: Secondary | ICD-10-CM

## 2021-07-30 MED ORDER — LEVETIRACETAM 500 MG PO TABS: 500.0000 mg | ORAL_TABLET | Freq: Two times a day (BID) | ORAL | 5 refills | Status: DC

## 2021-07-30 NOTE — Patient Instructions (Signed)
Start levetiracetam 500mg  twice daily For further evaluation of slurred speech, will check MRI of brain with and without contrast Follow up with Dr. as scheduled/recommended Follow up with me in 4 months.

## 2021-07-31 HISTORY — PX: IR RADIOLOGIST EVAL & MGMT: IMG5224

## 2021-08-12 ENCOUNTER — Other Ambulatory Visit: Payer: Medicaid - Out of State

## 2021-08-21 ENCOUNTER — Other Ambulatory Visit: Payer: Self-pay

## 2021-08-31 ENCOUNTER — Telehealth: Payer: Self-pay | Admitting: Nurse Practitioner

## 2021-08-31 NOTE — Telephone Encounter (Signed)
Received medical records request from [facility] per patient request to send office notes from 2020 to present. Printed and sent office notes from visits 04/14/20, 04/28/20, 08/18/20 as those are the only visits patient has had at this practice.

## 2021-09-02 ENCOUNTER — Telehealth: Payer: Self-pay | Admitting: Neurology

## 2021-09-02 ENCOUNTER — Other Ambulatory Visit: Payer: Self-pay

## 2021-09-02 DIAGNOSIS — Q283 Other malformations of cerebral vessels: Secondary | ICD-10-CM

## 2021-09-02 DIAGNOSIS — I729 Aneurysm of unspecified site: Secondary | ICD-10-CM

## 2021-09-02 NOTE — Telephone Encounter (Signed)
This is already, approved. Will keep location. Awaiting [facility] for MRI appt

## 2021-09-02 NOTE — Telephone Encounter (Signed)
Jose Koch from [facility] called in stating they had a PA for an MRI for the pt, but they were wanting to change the location to [facility]. [facility] is out of network, so they would need a new PA for [facility] so they can request for out of network. It would need to have "New Request" on the front and be faxed to the Urgent Fax line at [phone] so it can be processed quickly.

## 2021-09-09 ENCOUNTER — Encounter (HOSPITAL_COMMUNITY): Payer: Self-pay

## 2021-09-09 ENCOUNTER — Ambulatory Visit: Payer: Medicaid - Out of State

## 2021-10-05 ENCOUNTER — Telehealth: Payer: Self-pay

## 2021-10-05 NOTE — Telephone Encounter (Signed)
I called [facility], left message on voicemail per Dr. Everlena Cooper patient

## 2021-10-05 NOTE — Telephone Encounter (Signed)
[facility] dept called an asking if patient had a brain stent? I do not see this, can you confirm please, call back [phone], ask for [place] or [place]. Thank you, he is to be scheduled for MRI brain w and without contrast, appt is pending, until confirmation.

## 2021-10-06 ENCOUNTER — Inpatient Hospital Stay: Payer: Medicaid - Out of State | Attending: Hematology

## 2021-10-06 ENCOUNTER — Inpatient Hospital Stay: Payer: Medicaid - Out of State

## 2021-10-06 DIAGNOSIS — R110 Nausea: Secondary | ICD-10-CM | POA: Diagnosis not present

## 2021-10-06 DIAGNOSIS — R161 Splenomegaly, not elsewhere classified: Secondary | ICD-10-CM | POA: Diagnosis not present

## 2021-10-06 DIAGNOSIS — D696 Thrombocytopenia, unspecified: Secondary | ICD-10-CM

## 2021-10-06 DIAGNOSIS — E538 Deficiency of other specified B group vitamins: Secondary | ICD-10-CM | POA: Diagnosis not present

## 2021-10-06 DIAGNOSIS — R059 Cough, unspecified: Secondary | ICD-10-CM | POA: Insufficient documentation

## 2021-10-06 LAB — LACTATE DEHYDROGENASE: LDH: 114 U/L (ref 98–192)

## 2021-10-06 LAB — CBC WITH DIFFERENTIAL/PLATELET
Abs Immature Granulocytes: 0.03 10*3/uL (ref 0.00–0.07)
Basophils Absolute: 0 10*3/uL (ref 0.0–0.1)
Basophils Relative: 0 %
Eosinophils Absolute: 0 10*3/uL (ref 0.0–0.5)
Eosinophils Relative: 1 %
HCT: 31 % — ABNORMAL LOW (ref 39.0–52.0)
Hemoglobin: 10.3 g/dL — ABNORMAL LOW (ref 13.0–17.0)
Immature Granulocytes: 1 %
Lymphocytes Relative: 26 %
Lymphs Abs: 1.2 10*3/uL (ref 0.7–4.0)
MCH: 28 pg (ref 26.0–34.0)
MCHC: 33.2 g/dL (ref 30.0–36.0)
MCV: 84.2 fL (ref 80.0–100.0)
Monocytes Absolute: 0.3 10*3/uL (ref 0.1–1.0)
Monocytes Relative: 6 %
Neutro Abs: 3 10*3/uL (ref 1.7–7.7)
Neutrophils Relative %: 66 %
Platelets: 110 10*3/uL — ABNORMAL LOW (ref 150–400)
RBC: 3.68 MIL/uL — ABNORMAL LOW (ref 4.22–5.81)
RDW: 14 % (ref 11.5–15.5)
WBC: 4.5 10*3/uL (ref 4.0–10.5)
nRBC: 0 % (ref 0.0–0.2)

## 2021-10-06 LAB — FOLATE: Folate: 4.2 ng/mL — ABNORMAL LOW (ref >5.9)

## 2021-10-06 LAB — FERRITIN: Ferritin: 585 ng/mL — ABNORMAL HIGH (ref 24–336)

## 2021-10-06 LAB — IRON AND TIBC
Iron: 58 ug/dL (ref 45–182)
Saturation Ratios: 17 % — ABNORMAL LOW (ref 17.9–39.5)
TIBC: 338 ug/dL (ref 250–450)
UIBC: 280 ug/dL

## 2021-10-06 LAB — VITAMIN B12: Vitamin B-12: 369 pg/mL (ref 180–914)

## 2021-10-09 ENCOUNTER — Ambulatory Visit (HOSPITAL_COMMUNITY)
Admission: RE | Admit: 2021-10-09 | Discharge: 2021-10-09 | Disposition: A | Discharge status: Home / Self Care | Payer: Medicaid - Out of State | Source: Ambulatory Visit | Attending: Vascular Surgery | Admitting: Vascular Surgery

## 2021-10-09 ENCOUNTER — Ambulatory Visit: Payer: Medicaid - Out of State | Admitting: Physician Assistant

## 2021-10-09 VITALS — BP 91/62 | HR 93 | Temp 97.8°F | Ht 68.0 in | Wt 159.3 lb

## 2021-10-09 DIAGNOSIS — I739 Peripheral vascular disease, unspecified: Secondary | ICD-10-CM

## 2021-10-09 DIAGNOSIS — M79606 Pain in leg, unspecified: Secondary | ICD-10-CM | POA: Diagnosis not present

## 2021-10-09 DIAGNOSIS — I70223 Atherosclerosis of native arteries of extremities with rest pain, bilateral legs: Secondary | ICD-10-CM

## 2021-10-09 DIAGNOSIS — I70212 Atherosclerosis of native arteries of extremities with intermittent claudication, left leg: Secondary | ICD-10-CM | POA: Diagnosis not present

## 2021-10-09 NOTE — Progress Notes (Signed)
Office Note     CC:  follow up Requesting Provider:  Minna Antis, DO  HPI: Jose Koch is a 51 y.o. (02-11-1971) male who presents for evaluation of PAD.  He was seen in the office 1 year ago with left calf claudication.  He was recommended a walking program.  Since then he states the distance before claudication symptom onset of the left calf has increased.  He denies any rest pain or tissue loss.  He denies tobacco use.  He is on aspirin and Brilinta due to intracranial neuro IR intervention.   Past Medical History:  Diagnosis Date   COVID    COVID 02/2020   very sick - admitted to the hospital   Diabetes mellitus without complication ([place])    GERD (gastroesophageal reflux disease)    Headache    High cholesterol    History of kidney stones    Hypertension    was diagnosed 2 years ago, never treated and states BP is always - documented 07/13/21   Ichthyosis vulgaris    Pneumonia    Sleep apnea    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    Past Surgical History:  Procedure Laterality Date   IR ANGIO INTRA EXTRACRAN SEL COM CAROTID INNOMINATE BILAT MOD SED  04/17/2021   IR ANGIO INTRA EXTRACRAN SEL INTERNAL CAROTID UNI R MOD SED  07/17/2021   IR ANGIO VERTEBRAL SEL VERTEBRAL BILAT MOD SED  04/17/2021   IR CT HEAD LTD  07/15/2021   IR INTRA CRAN STENT  07/15/2021   IR RADIOLOGIST EVAL & MGMT  03/05/2021   IR RADIOLOGIST EVAL & MGMT  05/04/2021   IR RADIOLOGIST EVAL & MGMT  07/31/2021   IR US GUIDE VASC ACCESS RIGHT  04/17/2021   IR US GUIDE VASC ACCESS RIGHT  07/15/2021   RADIOLOGY WITH ANESTHESIA N/A 07/15/2021   Procedure: STENTING;  Surgeon: Luanne Bras, MD;  Location: [place];  Service: Radiology;  Laterality: N/A;   TONSILLECTOMY     wisdom teeth removal      Social History   Socioeconomic History   Marital status: Significant Other    Spouse name: Not on file   Number of children: Not on file   Years of education: Not on file   Highest education level: Not  on file  Occupational History   Occupation: unemployed  Tobacco Use   Smoking status: Never   Smokeless tobacco: Never  Vaping Use   Vaping Use: Never used  Substance and Sexual Activity   Alcohol use: Not Currently   Drug use: Never   Sexual activity: Not on file  Other Topics Concern   Not on file  Social History Narrative   Right handed    Social Determinants of Health   Financial Resource Strain: Low Risk  (05/26/2020)   Overall Financial Resource Strain (CARDIA)    Difficulty of Paying Living Expenses: Not hard at all  Food Insecurity: No Food Insecurity (05/26/2020)   Hunger Vital Sign    Worried About Running Out of Food in the Last Year: Never true    [place] in the Last Year: Never true  Transportation Needs: Unmet Transportation Needs (05/26/2020)   PRAPARE - Transportation    Lack of Transportation (Medical): Yes    Lack of Transportation (Non-Medical): Yes  Physical Activity: Sufficiently Active (05/26/2020)   Exercise Vital Sign    Days of Exercise per Week: 7 days    Minutes of Exercise  per Session: 30 min  Stress: Stress Concern Present (05/26/2020)   [place]    Feeling of Stress : Very much  Social Connections: Socially Isolated (05/26/2020)   Social Connection and Isolation Panel [NHANES]    Frequency of Communication with Friends and Family: More than three times a week    Frequency of Social Gatherings with Friends and Family: More than three times a week    Attends Religious Services: Never    Marine scientist or Organizations: No    Attends Archivist Meetings: Never    Marital Status: Divorced  Human resources officer Violence: Not At Risk (05/26/2020)   Humiliation, Afraid, Rape, and Kick questionnaire    Fear of Current or Ex-Partner: No    Emotionally Abused: No    Physically Abused: No    Sexually Abused: No    Family History  Family history unknown: Yes     Current Outpatient Medications  Medication Sig Dispense Refill   aspirin EC 81 MG tablet Take 81 mg by mouth daily. Swallow whole.     blood glucose meter kit and supplies KIT Dispense based on patient and insurance preference (something with affordable test strips). Use to check glucose twice daily as directed. 1 each 0   glucose blood (ACCU-CHEK GUIDE) test strip Use as instructed to monitor glucose twice daily, before breakfast and before bed 100 each 12   Insulin Glargine (BASAGLAR KWIKPEN) 100 UNIT/ML Inject 30 Units into the skin at bedtime.     Insulin Pen Needle 31G X 5 MM MISC 1 Units by Does not apply route daily. 100 each 1   levETIRAcetam (KEPPRA) 500 MG tablet Take 1 tablet (500 mg total) by mouth 2 (two) times daily. 60 tablet 5   metFORMIN (GLUCOPHAGE XR) 500 MG 24 hr tablet Take 1 tablet (500 mg total) by mouth daily with breakfast. 90 tablet 0   metoCLOPramide (REGLAN) 10 MG tablet Take 10 mg by mouth 3 (three) times daily before meals.     OneTouch Delica Lancets 23T MISC PLEASE SEE ATTACHED FOR DETAILED DIRECTIONS     pantoprazole (PROTONIX) 40 MG tablet TAKE 1 TABLET BY MOUTH 2 TIMES DAILY BEFORE A MEAL 30 MINUTES BEFORE BREAKFAST AND DINNER 60 tablet 3   ticagrelor (BRILINTA) 90 MG TABS tablet Take 1 tablet (90 mg total) by mouth 2 (two) times daily. 60 tablet 5   No current facility-administered medications for this visit.    Allergies  Allergen Reactions   Other     Marijuana - headaches and dizziness      REVIEW OF SYSTEMS:   '[X]'$  denotes positive finding, $RemoveBeforeDEI'[ ]'[id number]$  denotes negative finding Cardiac  Comments:  Chest pain or chest pressure:    Shortness of breath upon exertion:    Short of breath when lying flat:    Irregular heart rhythm:        Vascular    Pain in calf, thigh, or hip brought on by ambulation:    Pain in feet at night that wakes you up from your sleep:     Blood clot in your veins:    Leg swelling:         Pulmonary    Oxygen at home:     Productive cough:     Wheezing:         Neurologic    Sudden weakness in arms or legs:     Sudden numbness in arms or legs:  Sudden onset of difficulty speaking or slurred speech:    Temporary loss of vision in one eye:     Problems with dizziness:         Gastrointestinal    Blood in stool:     Vomited blood:         Genitourinary    Burning when urinating:     Blood in urine:        Psychiatric    Major depression:         Hematologic    Bleeding problems:    Problems with blood clotting too easily:        Skin    Rashes or ulcers:        Constitutional    Fever or chills:      PHYSICAL EXAMINATION:  Vitals:   10/09/21 1250 10/09/21 1255  BP: 90/65 91/62  Pulse: 93   Temp: 97.8 F (36.6 C)   TempSrc: Temporal   SpO2: 98%   Weight: 159 lb 4.8 oz (72.3 kg)   Height: 5' 8" (1.727 m)     General:  WDWN in NAD; vital signs documented above Gait: Not observed HENT: WNL, normocephalic Pulmonary: normal non-labored breathing , without Rales, rhonchi,  wheezing Cardiac: regular HR Abdomen: soft, NT, no masses Skin: without rashes Extremities:  ischemic changes, without Gangrene , without cellulitis; without open wounds;  Musculoskeletal: no muscle wasting or atrophy  Neurologic: A&O X 3;  No focal weakness or paresthesias are detected Psychiatric:  The pt has Normal affect.   Non-Invasive Vascular Imaging:   ABI/TBIToday's ABIToday's TBIPrevious ABIPrevious TBI  +-------+-----------+-----------+------------+------------+  Right  0.87                                            +-------+-----------+-----------+------------+------------+  Left   0.67       0.47           ASSESSMENT/PLAN:: 51 y.o. male with PAD, left calf claudication  -Since last office visit 1 year ago, distance prior to claudication symptom onset and left calf has increased -Continue walking program -Continue aspirin and statin -No indication for intervention despite  decreased ABIs; repeat in 1 year   Dagoberto Ligas, PA-C [facility] [phone]  Clinic MD:   Virl Cagey

## 2021-10-13 ENCOUNTER — Inpatient Hospital Stay (HOSPITAL_BASED_OUTPATIENT_CLINIC_OR_DEPARTMENT_OTHER): Payer: Medicaid - Out of State | Admitting: Hematology

## 2021-10-13 ENCOUNTER — Other Ambulatory Visit: Payer: Self-pay | Admitting: *Deleted

## 2021-10-13 VITALS — BP 117/76 | HR 94 | Temp 97.2°F | Resp 18 | Ht 68.0 in | Wt 161.1 lb

## 2021-10-13 DIAGNOSIS — E538 Deficiency of other specified B group vitamins: Secondary | ICD-10-CM

## 2021-10-13 DIAGNOSIS — D696 Thrombocytopenia, unspecified: Secondary | ICD-10-CM

## 2021-10-13 DIAGNOSIS — E611 Iron deficiency: Secondary | ICD-10-CM

## 2021-10-13 MED ORDER — FOLIC ACID 1 MG PO TABS
1.0000 mg | ORAL_TABLET | Freq: Every day | ORAL | 4 refills | Status: DC
Start: 2021-10-13 — End: 2022-11-01

## 2021-10-13 NOTE — Patient Instructions (Addendum)
[facility] at [facility] Discharge Instructions   You were seen and examined today by Dr. Ellin Saba.  He reviewed the results of your lab work which are mostly normal/stable. Your folic acid is very low. You would benefit from taking folic acid 1 mg daily. Your iron is also a little low. You should take iron tablets 325 mg daily. If the iron causes constipation you should use a stool softener along with it to help.       Thank you for choosing [facility] at [facility] to provide your oncology and hematology care.  To afford each patient quality time with our provider, please arrive at least 15 minutes before your scheduled appointment time.   If you have a lab appointment with the Cancer Center please come in thru the Main Entrance and check in at the main information desk.  You need to re-schedule your appointment should you arrive 10 or more minutes late.  We strive to give you quality time with our providers, and arriving late affects you and other patients whose appointments are after yours.  Also, if you no show three or more times for appointments you may be dismissed from the clinic at the providers discretion.     Again, thank you for choosing [facility].  Our hope is that these requests will decrease the amount of time that you wait before being seen by our physicians.       _____________________________________________________________  Should you have questions after your visit to [facility], please contact our office at [phone] and follow the prompts.  Our office hours are 8:00 a.m. and 4:30 p.m. Monday - Friday.  Please note that voicemails left after 4:00 p.m. may not be returned until the following business day.  We are closed weekends and major holidays.  You do have access to a nurse 24-7, just call the main number to the clinic [phone] and do not press any options, hold on the line and a  nurse will answer the phone.    For prescription refill requests, have your pharmacy contact our office and allow 72 hours.    Due to Covid, you will need to wear a mask upon entering the hospital. If you do not have a mask, a mask will be given to you at the Main Entrance upon arrival. For doctor visits, patients may have 1 support person age 28 or older with them. For treatment visits, patients can not have anyone with them due to social distancing guidelines and our immunocompromised population.

## 2021-10-13 NOTE — Progress Notes (Signed)
[place], [place] [address]   CLINIC:  Medical Oncology/Hematology  PCP:  Minna Antis, DO [address] / [place] [address]  [phone]  REASON FOR VISIT:  Follow-up for thrombocytopenia and splenomegaly  PRIOR THERAPY: none  CURRENT THERAPY: surveillance  INTERVAL HISTORY:  [name], a 51 y.o. male, returns for follow-up of thrombocytopenia and splenomegaly.  He had a right cavernous internal carotid artery stenosis, status post stenting.  Denies any bleeding per rectum or melena.  Energy levels are low at 40 to 50%.  REVIEW OF SYSTEMS:  Review of Systems  Constitutional:  Negative for appetite change, fatigue, fever and unexpected weight change.  Respiratory:  Positive for cough.   Gastrointestinal:  Positive for nausea.  Endocrine: Negative for hot flashes.  Musculoskeletal:  Negative for arthralgias.  Neurological:  Positive for dizziness. Negative for headaches.  All other systems reviewed and are negative.   PAST MEDICAL/SURGICAL HISTORY:  Past Medical History:  Diagnosis Date   COVID    COVID 02/2020   very sick - admitted to the hospital   Diabetes mellitus without complication ([place])    GERD (gastroesophageal reflux disease)    Headache    High cholesterol    History of kidney stones    Hypertension    was diagnosed 2 years ago, never treated and states BP is always - documented 07/13/21   Ichthyosis vulgaris    Pneumonia    Sleep apnea    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   Past Surgical History:  Procedure Laterality Date   IR ANGIO INTRA EXTRACRAN SEL COM CAROTID INNOMINATE BILAT MOD SED  04/17/2021   IR ANGIO INTRA EXTRACRAN SEL INTERNAL CAROTID UNI R MOD SED  07/17/2021   IR ANGIO VERTEBRAL SEL VERTEBRAL BILAT MOD SED  04/17/2021   IR CT HEAD LTD  07/15/2021   IR INTRA CRAN STENT  07/15/2021   IR RADIOLOGIST EVAL & MGMT  03/05/2021   IR RADIOLOGIST EVAL & MGMT  05/04/2021   IR  RADIOLOGIST EVAL & MGMT  07/31/2021   IR US GUIDE VASC ACCESS RIGHT  04/17/2021   IR US GUIDE VASC ACCESS RIGHT  07/15/2021   RADIOLOGY WITH ANESTHESIA N/A 07/15/2021   Procedure: STENTING;  Surgeon: Luanne Bras, MD;  Location: [facility];  Service: Radiology;  Laterality: N/A;   TONSILLECTOMY     wisdom teeth removal      SOCIAL HISTORY:  Social History   Socioeconomic History   Marital status: Significant Other    Spouse name: Not on file   Number of children: Not on file   Years of education: Not on file   Highest education level: Not on file  Occupational History   Occupation: unemployed  Tobacco Use   Smoking status: Never   Smokeless tobacco: Never  Vaping Use   Vaping Use: Never used  Substance and Sexual Activity   Alcohol use: Not Currently   Drug use: Never   Sexual activity: Not on file  Other Topics Concern   Not on file  Social History Narrative   Right handed    Social Determinants of Health   Financial Resource Strain: Low Risk  (05/26/2020)   Overall Financial Resource Strain (CARDIA)    Difficulty of Paying Living Expenses: Not hard at all  Food Insecurity: No Food Insecurity (05/26/2020)   Hunger Vital Sign    Worried About Running Out of Food in the Last  Year: Never true    [place] in the Last Year: Never true  Transportation Needs: Unmet Transportation Needs (05/26/2020)   PRAPARE - Transportation    Lack of Transportation (Medical): Yes    Lack of Transportation (Non-Medical): Yes  Physical Activity: Sufficiently Active (05/26/2020)   Exercise Vital Sign    Days of Exercise per Week: 7 days    Minutes of Exercise per Session: 30 min  Stress: Stress Concern Present (05/26/2020)   [place]    Feeling of Stress : Very much  Social Connections: Socially Isolated (05/26/2020)   Social Connection and Isolation Panel [NHANES]    Frequency of Communication with Friends and Family: More  than three times a week    Frequency of Social Gatherings with Friends and Family: More than three times a week    Attends Religious Services: Never    Marine scientist or Organizations: No    Attends Archivist Meetings: Never    Marital Status: Divorced  Human resources officer Violence: Not At Risk (05/26/2020)   Humiliation, Afraid, Rape, and Kick questionnaire    Fear of Current or Ex-Partner: No    Emotionally Abused: No    Physically Abused: No    Sexually Abused: No    FAMILY HISTORY:  Family History  Family history unknown: Yes    CURRENT MEDICATIONS:  Current Outpatient Medications  Medication Sig Dispense Refill   aspirin EC 81 MG tablet Take 81 mg by mouth daily. Swallow whole.     blood glucose meter kit and supplies KIT Dispense based on patient and insurance preference (something with affordable test strips). Use to check glucose twice daily as directed. 1 each 0   folic acid (FOLVITE) 1 MG tablet Take 1 tablet (1 mg total) by mouth daily. 90 tablet 4   glucose blood (ACCU-CHEK GUIDE) test strip Use as instructed to monitor glucose twice daily, before breakfast and before bed 100 each 12   Insulin Glargine (BASAGLAR KWIKPEN) 100 UNIT/ML Inject 30 Units into the skin at bedtime.     Insulin Pen Needle 31G X 5 MM MISC 1 Units by Does not apply route daily. 100 each 1   levETIRAcetam (KEPPRA) 500 MG tablet Take 1 tablet (500 mg total) by mouth 2 (two) times daily. 60 tablet 5   metFORMIN (GLUCOPHAGE XR) 500 MG 24 hr tablet Take 1 tablet (500 mg total) by mouth daily with breakfast. 90 tablet 0   metoCLOPramide (REGLAN) 10 MG tablet Take 10 mg by mouth 3 (three) times daily before meals.     OneTouch Delica Lancets 41P MISC PLEASE SEE ATTACHED FOR DETAILED DIRECTIONS     pantoprazole (PROTONIX) 40 MG tablet TAKE 1 TABLET BY MOUTH 2 TIMES DAILY BEFORE A MEAL 30 MINUTES BEFORE BREAKFAST AND DINNER 60 tablet 3   ticagrelor (BRILINTA) 90 MG TABS tablet Take 1 tablet (90  mg total) by mouth 2 (two) times daily. 60 tablet 5   No current facility-administered medications for this visit.    ALLERGIES:  No Active Allergies   PHYSICAL EXAM:  Performance status (ECOG): 0 - Asymptomatic  Vitals:   10/13/21 1155  BP: 117/76  Pulse: 94  Resp: 18  Temp: (!) 97.2 F (36.2 C)  SpO2: 96%   Wt Readings from Last 3 Encounters:  10/13/21 161 lb 1.6 oz (73.1 kg)  10/09/21 159 lb 4.8 oz (72.3 kg)  07/30/21 148 lb 12.8 oz (67.5 kg)  Physical Exam Vitals reviewed.  Constitutional:      Appearance: Normal appearance.  Cardiovascular:     Rate and Rhythm: Normal rate and regular rhythm.     Pulses: Normal pulses.     Heart sounds: Normal heart sounds.  Pulmonary:     Effort: Pulmonary effort is normal.     Breath sounds: Normal breath sounds.  Abdominal:     Palpations: Abdomen is soft. There is splenomegaly (spleen tip palpable on deep inspiration). There is no hepatomegaly or mass.     Tenderness: There is no abdominal tenderness.  Lymphadenopathy:     Upper Body:     Right upper body: No supraclavicular, axillary or pectoral adenopathy.     Left upper body: No supraclavicular, axillary or pectoral adenopathy.  Neurological:     General: No focal deficit present.     Mental Status: He is alert and oriented to person, place, and time.  Psychiatric:        Mood and Affect: Mood normal.        Behavior: Behavior normal.     LABORATORY DATA:  I have reviewed the labs as listed.     Latest Ref Rng & Units 10/06/2021    7:51 AM 07/16/2021    9:55 PM 07/16/2021    5:27 AM  CBC  WBC 4.0 - 10.5 K/uL 4.5  7.0  4.0   Hemoglobin 13.0 - 17.0 g/dL 10.3  10.8  9.5   Hematocrit 39.0 - 52.0 % 31.0  33.6  27.6   Platelets 150 - 400 K/uL 110  100  87       Latest Ref Rng & Units 07/16/2021    9:55 PM 07/16/2021    5:27 AM 07/15/2021    7:10 AM  CMP  Glucose 70 - 99 mg/dL 230  160  154   BUN 6 - 20 mg/dL 22  15  23    Creatinine 0.61 - 1.24 mg/dL 1.41   1.07  1.45   Sodium 135 - 145 mmol/L 137  137  136   Potassium 3.5 - 5.1 mmol/L 4.5  3.6  4.4   Chloride 98 - 111 mmol/L 107  105  104   CO2 22 - 32 mmol/L 20  23  24    Calcium 8.9 - 10.3 mg/dL 8.4  8.1  8.9       Component Value Date/Time   RBC 3.68 (L) 10/06/2021 0751   MCV 84.2 10/06/2021 0751   MCH 28.0 10/06/2021 0751   MCHC 33.2 10/06/2021 0751   RDW 14.0 10/06/2021 0751   LYMPHSABS 1.2 10/06/2021 0751   MONOABS 0.3 10/06/2021 0751   EOSABS 0.0 10/06/2021 0751   BASOSABS 0.0 10/06/2021 0751    DIAGNOSTIC IMAGING:  I have independently reviewed the scans and discussed with the patient. VAS [place] ABI WITH/WO TBI  Result Date: 10/09/2021  LOWER EXTREMITY DOPPLER STUDY Patient Name:  BRAILEN MACNEAL  Date of Exam:   10/09/2021 Medical Rec #: [id number]      Accession #:    [id number] Date of Birth: December 07, 1970      Patient Gender: M Patient Age:   36 years Exam Location:  [facility] Procedure:      VAS [place] ABI WITH/WO TBI Referring Phys: Servando Snare --------------------------------------------------------------------------------  Indications: Rest pain. High Risk Factors: Hypertension, Diabetes.  Comparison Study: 06/02/20 prior Performing Technologist: Archie Patten RVS  Examination Guidelines: A complete evaluation includes at minimum, Doppler waveform signals and systolic blood pressure reading  at the level of bilateral brachial, anterior tibial, and posterior tibial arteries, when vessel segments are accessible. Bilateral testing is considered an integral part of a complete examination. Photoelectric Plethysmograph (PPG) waveforms and toe systolic pressure readings are included as required and additional duplex testing as needed. Limited examinations for reoccurring indications may be performed as noted.  ABI Findings: +---------+------------------+-----+---------+---------------------------------+ Right    Rt Pressure (mmHg)IndexWaveform Comment                            +---------+------------------+-----+---------+---------------------------------+ Brachial 123                    triphasic                                  +---------+------------------+-----+---------+---------------------------------+ PTA      104               0.85 biphasic                                   +---------+------------------+-----+---------+---------------------------------+ DP       107               0.87 triphasic                                  +---------+------------------+-----+---------+---------------------------------+ Great Toe                                unable to obtain pressure due to                                           machine malfunction               +---------+------------------+-----+---------+---------------------------------+ +---------+------------------+-----+---------+-------+ Left     Lt Pressure (mmHg)IndexWaveform Comment +---------+------------------+-----+---------+-------+ Brachial 111                    triphasic        +---------+------------------+-----+---------+-------+ PTA      55                0.45 biphasic         +---------+------------------+-----+---------+-------+ DP       83                0.67 biphasic         +---------+------------------+-----+---------+-------+ Great Toe58                0.47 Abnormal         +---------+------------------+-----+---------+-------+ +-------+-----------+-----------+------------+------------+ ABI/TBIToday's ABIToday's TBIPrevious ABIPrevious TBI +-------+-----------+-----------+------------+------------+ Right  0.87                                           +-------+-----------+-----------+------------+------------+ Left   0.67       0.47                                +-------+-----------+-----------+------------+------------+  Summary: Right: Resting right ankle-brachial index indicates mild right lower extremity arterial disease. Left:  Resting left ankle-brachial index indicates moderate left lower extremity arterial disease. The left toe-brachial index is abnormal. *See table(s) above for measurements and observations.  Electronically signed by Orlie Pollen on 10/09/2021 at 4:54:51 PM.    Final      ASSESSMENT:  1.  Splenomegaly with multiple lesions: -Recent hospitalization with nausea and vomiting. -CTAP on 04/05/2020 showed enlarged spleen measuring 15 cm with multiple hypodense soft tissue density lesions measuring 2.3 cm, 2.7 cm.  No significant adenopathy.  Liver is diffusely hypodense consistent with fatty infiltration. -He had weight loss during recent hospitalization and is gaining it back. -No recurrent infections. -PET scan on 06/30/2020 images were reviewed with him. - Mild hypermetabolic small bilateral cervical lymph nodes, indeterminate.  Clinically not palpable. - No splenic hypermetabolism.  Spleen size slightly improved to 2 fingerbreadths below the left costal margin on inspiration. - EBV serology consistent with previous infection.  CMV was negative.  Other nutritional deficiency work-up for thrombocytopenia was negative. - Methylmalonic acid was slightly elevated.  Recommend vitamin B12 1 mg tablet daily.   2.  Social/family history: -He takes care of his girlfriend.  He worked previously as a Programmer, applications.  He was never smoker.  Rarely drinks alcohol. -Mother was adopted.  Does not know his father.   PLAN:  1.  Splenomegaly with multiple lesions: -CTAP on 12/03/2020: Stable splenomegaly with no significant change in multiple splenic hypodensities.  No adenopathy. - He does not have any B symptoms.  Spleen tip vaguely palpable on deep inspiration. - He had a right ICA stent placement and is currently on aspirin and Brilinta. - Blood work shows hemoglobin 10.3 down from 12 6 months ago.  Ferritin is 585 and percent saturation 17.  He also had CKD. - Recommend starting iron tablet daily with stool  softener. - We will plan to repeat ferritin and iron panel in 6 months.   2.  Mild thrombocytopenia: - Mild to moderate thrombocytopenia since 2017, likely from splenomegaly. - Current CBC shows platelet count is 110 and stable.  3.  Folate deficiency: - Folic acid level is 4.2.  We will start him on folic acid 1 mg tablet daily.  We will repeat levels in 6 months.  Orders placed this encounter:  No orders of the defined types were placed in this encounter.    Derek Jack, MD [place] [phone]

## 2021-10-14 ENCOUNTER — Telehealth: Payer: Self-pay | Admitting: Neurology

## 2021-10-14 DIAGNOSIS — Q283 Other malformations of cerebral vessels: Secondary | ICD-10-CM

## 2021-10-14 NOTE — Telephone Encounter (Signed)
Violet from [facility] called in and needs updated order for MRI of brain without contrast. His creatinine levels were too high for the with and without contrast. Fax number is [phone].

## 2021-10-14 NOTE — Telephone Encounter (Signed)
Violet called in to let Neysa Bonito know the pt is scheduled today for 4:30 for the MRI

## 2021-10-14 NOTE — Telephone Encounter (Signed)
Thank you :)

## 2021-10-15 NOTE — Telephone Encounter (Signed)
Per Dr.Jaffe can order MRI Brain W/O contrast.

## 2021-10-19 NOTE — Telephone Encounter (Signed)
I am thinking you want him to continue regardless but wanted your input first.

## 2021-11-19 ENCOUNTER — Telehealth (HOSPITAL_COMMUNITY): Payer: Self-pay

## 2021-11-19 NOTE — Telephone Encounter (Signed)
Returned call about f/u. Waiting on insurance approval. Will call back to schedule once it is received. AW

## 2021-11-23 ENCOUNTER — Encounter: Payer: Self-pay | Admitting: Neurology

## 2021-12-09 ENCOUNTER — Other Ambulatory Visit (HOSPITAL_COMMUNITY): Payer: Self-pay | Admitting: Interventional Radiology

## 2021-12-09 DIAGNOSIS — I6529 Occlusion and stenosis of unspecified carotid artery: Secondary | ICD-10-CM
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
## 2021-12-16 ENCOUNTER — Ambulatory Visit (HOSPITAL_COMMUNITY)
Admission: RE | Admit: 2021-12-16 | Discharge: 2021-12-16 | Disposition: A | Discharge status: Home / Self Care | Payer: Medicaid Other | Source: Ambulatory Visit | Attending: Interventional Radiology | Admitting: Interventional Radiology

## 2021-12-16 ENCOUNTER — Encounter: Payer: Self-pay | Admitting: Neurology

## 2021-12-16 ENCOUNTER — Ambulatory Visit (INDEPENDENT_AMBULATORY_CARE_PROVIDER_SITE_OTHER): Payer: Medicaid Other | Admitting: Neurology

## 2021-12-16 ENCOUNTER — Encounter (HOSPITAL_COMMUNITY): Payer: Self-pay

## 2021-12-16 VITALS — BP 100/68 | HR 106 | Resp 18 | Ht 68.0 in | Wt 166.0 lb

## 2021-12-16 DIAGNOSIS — E1165 Type 2 diabetes mellitus with hyperglycemia: Secondary | ICD-10-CM | POA: Insufficient documentation

## 2021-12-16 DIAGNOSIS — I6529 Occlusion and stenosis of unspecified carotid artery: Secondary | ICD-10-CM | POA: Insufficient documentation

## 2021-12-16 DIAGNOSIS — G40209 Localization-related (focal) (partial) symptomatic epilepsy and epileptic syndromes with complex partial seizures, not intractable, without status epilepticus: Secondary | ICD-10-CM

## 2021-12-16 DIAGNOSIS — N179 Acute kidney failure, unspecified: Secondary | ICD-10-CM | POA: Insufficient documentation

## 2021-12-16 DIAGNOSIS — I671 Cerebral aneurysm, nonruptured: Secondary | ICD-10-CM | POA: Diagnosis not present

## 2021-12-16 DIAGNOSIS — I6521 Occlusion and stenosis of right carotid artery: Secondary | ICD-10-CM | POA: Diagnosis not present

## 2021-12-16 LAB — POCT I-STAT CREATININE: Creatinine, Ser: 1.5 mg/dL — ABNORMAL HIGH (ref 0.61–1.24)

## 2021-12-16 MED ORDER — LEVETIRACETAM 500 MG PO TABS: 500.0000 mg | ORAL_TABLET | Freq: Two times a day (BID) | ORAL | 5 refills | Status: DC

## 2021-12-16 MED ORDER — IOHEXOL 350 MG/ML SOLN
75.0000 mL | Freq: Once | INTRAVENOUS | Status: AC | PRN
  Administered 2021-12-16: 75 mL via INTRAVENOUS

## 2021-12-16 NOTE — Patient Instructions (Signed)
1. Continue levetiracetam 500mg  twice daily  2. Avoid activities in which a seizure would cause danger to yourself or to others.  Don't operate dangerous machinery, swim alone, or climb in high or dangerous places, such as on ladders, roofs, or girders.  Do not drive unless your doctor says you may.  3. If you have any warning that you may have a seizure, lay down in a safe place where you can't hurt yourself.    4.  No driving for 6 months from last seizure, as per [facility] (not until at least December 2).   Please refer to the following link on the [place] website for more information: http://www.epilepsyfoundation.org/answerplace/Social/driving/drivingu.cfm   5.  Maintain good sleep hygiene.  6.  Notify your neurology if you are planning pregnancy or if you become pregnant.  7.  Contact your doctor if you have any problems that may be related to the medicine you are taking.  8.  Call 911 and bring the patient back to the ED if:        A.  The seizure lasts longer than 5 minutes.       B.  The patient doesn't awaken shortly after the seizure  C.  The patient has new problems such as difficulty seeing, speaking or moving  D.  The patient was injured during the seizure  E.  The patient has a temperature over 102 F (39C)  F.  The patient vomited and now is having trouble breathing

## 2021-12-17 ENCOUNTER — Ambulatory Visit: Payer: Medicaid - Out of State | Admitting: Neurology

## 2021-12-31 ENCOUNTER — Other Ambulatory Visit (HOSPITAL_COMMUNITY): Payer: Self-pay | Admitting: Interventional Radiology

## 2021-12-31 DIAGNOSIS — I6529 Occlusion and stenosis of unspecified carotid artery: Secondary | ICD-10-CM

## 2022-01-11 ENCOUNTER — Telehealth (HOSPITAL_COMMUNITY): Payer: Self-pay

## 2022-01-11 ENCOUNTER — Ambulatory Visit (HOSPITAL_COMMUNITY)
Admission: RE | Admit: 2022-01-11 | Discharge: 2022-01-11 | Disposition: A | Discharge status: Home / Self Care | Payer: Medicaid - Out of State | Source: Ambulatory Visit | Attending: Interventional Radiology | Admitting: Interventional Radiology

## 2022-01-11 NOTE — Telephone Encounter (Signed)
Pt was supposed to come in for a consult to discuss aneurysm treatment. Per Dr. Corliss Skains, recent imaging was stable and no need to discuss treatment at this time. Will f/u in 6 months. Pt agreed with this plan. AW

## 2022-01-20 ENCOUNTER — Other Ambulatory Visit: Payer: Self-pay | Admitting: Physician Assistant

## 2022-01-20 ENCOUNTER — Telehealth (HOSPITAL_COMMUNITY): Payer: Self-pay

## 2022-01-20 MED ORDER — TICAGRELOR 90 MG PO TABS: 90.0000 mg | ORAL_TABLET | Freq: Two times a day (BID) | ORAL | 5 refills | Status: DC

## 2022-01-20 NOTE — Telephone Encounter (Signed)
Pt called for Brilinta refill. I have sent a message to our PA, Toniann Fail to call this into the [facility] in [place] in his chart. AW

## 2022-03-31 DIAGNOSIS — E119 Type 2 diabetes mellitus without complications: Secondary | ICD-10-CM | POA: Diagnosis not present

## 2022-04-09 NOTE — Progress Notes (Signed)
[facility] Note  04/14/2022     CHIEF COMPLAINT Patient presents for Retina Evaluation   HISTORY OF PRESENT ILLNESS: Jose Koch is a 52 y.o. male who presents to the clinic today for:   HPI     Retina Evaluation   In both eyes.  This started 3 weeks ago.  Duration of 1 year.  I, the attending physician,  performed the HPI with the patient and updated documentation appropriately.        Comments   Patient here for Retina Evaluation. Patient states vision not so great. Has like a blind spot in middle of OS. Started 1 year ago. In the last 3 weeks couldn't see features on face. Just the outline. Has had brain surgery June 2023 front of head. Has a stint put in. Called pseudo aneurysm. Has one back of head to do surgery later.      Last edited by Bernarda Caffey, MD on 04/14/2022  5:13 PM.    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  Madilyn Hook LEE:  Ocular Hx- PMH-    CURRENT MEDICATIONS: No current outpatient medications on file. (Ophthalmic Drugs)   No current facility-administered medications for this visit. (Ophthalmic Drugs)   Current Outpatient Medications (Other)  Medication Sig   aspirin EC 81 MG tablet Take 81 mg by mouth daily. Swallow whole.   blood glucose meter kit and supplies KIT Dispense based on patient and insurance preference (something with affordable test strips). Use to check glucose twice daily as directed.   folic acid (FOLVITE) 1 MG tablet Take 1 tablet (1 mg total) by mouth daily.   glucose blood (ACCU-CHEK GUIDE) test strip Use as instructed to monitor glucose twice daily, before breakfast and before bed   Insulin Glargine (BASAGLAR KWIKPEN) 100 UNIT/ML Inject 30 Units into the skin at bedtime.   Insulin Pen Needle 31G X 5 MM MISC 1 Units by Does not apply route daily.   metFORMIN (GLUCOPHAGE XR) 500 MG 24 hr tablet Take 1 tablet (500 mg total) by mouth daily with breakfast.   metoCLOPramide (REGLAN) 10 MG tablet Take 10 mg by mouth 3 (three) times daily before meals.   Misc Natural Products (OSTEO BI-FLEX ADV DOUBLE ST PO) Take 1 tablet by mouth.   OneTouch Delica  Lancets 33G MISC PLEASE SEE ATTACHED FOR DETAILED DIRECTIONS   pantoprazole (PROTONIX) 40 MG tablet TAKE 1 TABLET BY MOUTH 2 TIMES DAILY BEFORE A MEAL 30 MINUTES BEFORE BREAKFAST AND DINNER   ticagrelor (BRILINTA) 90 MG TABS tablet Take 1 tablet (90 mg total) by mouth 2 (two) times daily.   fluticasone (FLONASE) 50 MCG/ACT nasal spray Place 2 sprays into both nostrils daily. (Patient not taking: Reported on 04/14/2022)   levETIRAcetam (KEPPRA) 500 MG tablet Take 1 tablet (500 mg total) by mouth 2 (two) times daily. (Patient not taking: Reported on 04/14/2022)   No current facility-administered medications for this visit. (Other)   REVIEW OF SYSTEMS: ROS   Positive for: Neurological, Eyes Last edited by Theodore Demark, COA  on 04/14/2022  1:43 PM.     ALLERGIES No Known Allergies  PAST MEDICAL HISTORY Past Medical History:  Diagnosis Date   COVID    COVID 02/2020   very sick - admitted to the hospital   Diabetes mellitus without complication ([place])    GERD (gastroesophageal reflux disease)    Headache    High cholesterol    History of kidney stones    Hypertension    was diagnosed 2 years ago, never treated and states BP is always - documented 07/13/21   Ichthyosis vulgaris    Pneumonia    Sleep apnea    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   Past Surgical History:  Procedure Laterality Date   IR ANGIO INTRA EXTRACRAN SEL COM CAROTID INNOMINATE BILAT MOD SED  04/17/2021   IR ANGIO INTRA EXTRACRAN SEL INTERNAL CAROTID UNI R MOD SED  07/17/2021   IR ANGIO VERTEBRAL SEL VERTEBRAL BILAT MOD SED  04/17/2021   IR CT HEAD LTD  07/15/2021   IR INTRA CRAN STENT  07/15/2021   IR RADIOLOGIST EVAL & MGMT  03/05/2021   IR RADIOLOGIST EVAL & MGMT  05/04/2021   IR RADIOLOGIST EVAL & MGMT  07/31/2021   IR US GUIDE VASC ACCESS RIGHT  04/17/2021   IR US GUIDE VASC ACCESS RIGHT  07/15/2021   RADIOLOGY WITH ANESTHESIA N/A 07/15/2021   Procedure: STENTING;  Surgeon: Luanne Bras, MD;  Location: [facility];  Service: Radiology;  Laterality: N/A;   TONSILLECTOMY     wisdom teeth removal     FAMILY HISTORY Family History  Family history unknown: Yes   SOCIAL HISTORY Social History   Tobacco Use   Smoking status: Never   Smokeless tobacco: Never  Vaping Use   Vaping Use: Never used  Substance Use Topics   Alcohol use: Not Currently   Drug use: Never       OPHTHALMIC EXAM:  Base Eye Exam     Visual Acuity (Snellen - Linear)       Right Left   Dist cc 20/150 +2 20/200 -1   Dist ph cc 20/100 NI    Correction: Glasses         Tonometry (Tonopen, 1:36 PM)       Right Left   Pressure 15 21         Pupils       Dark Light Shape React APD   Right 3 2 Round Brisk None   Left 3 2 Round Brisk  None         Visual Fields (Counting fingers)       Left Right    Full Full         Extraocular Movement  Right Left    Full, Ortho Full, Ortho         Neuro/Psych     Oriented x3: Yes   Mood/Affect: Normal         Dilation     Both eyes: 1.0% Mydriacyl, 2.5% Phenylephrine @ 1:36 PM           Slit Lamp and Fundus Exam     Slit Lamp Exam       Right Left   Lids/Lashes Dermatochalasis - upper lid, Meibomian gland dysfunction, +Scurf Dermatochalasis - upper lid, Meibomian gland dysfunction, +Scurf   Conjunctiva/Sclera White and quiet White and quiet   Cornea trace PEE, mild tear film debris 1+PEE, mild tear film debris   Anterior Chamber deep and clear deep and clear   Iris Round and dilated, No NVI Round and dilated, No NVI   Lens Clear Clear   Anterior Vitreous Vitreous syneresis mild syneresis         Fundus Exam       Right Left   Disc Pink and Sharp Pink, blurred margin, fine NVD, 360 exudate, scattered heme   C/D Ratio 0.2    Macula Blunted foveal reflex, severe edema with severe exudation, scattered DBH Blunted foveal reflex, severe edema and exudation, scattered DBH   Vessels attenuated, Tortuous, +NVE greatest along inferior arcades, +Sheathing attenuated, Tortuous, +NV, +Sheathing   Periphery Attached, scattered MA/DBH, heavy exudates greatest IN periphery Attached, severe exudation posteriorly, scattered MA / DBH           Refraction     Wearing Rx       Sphere Cylinder Axis Add   Right -2.50 +1.00 049 +1.00   Left -0.75 +1.25 178 +1.00            IMAGING AND PROCEDURES  Imaging and Procedures for 04/14/2022  OCT, Retina - OU - Both Eyes       Right Eye Quality was good. Central Foveal Thickness: 518. Progression has no prior data. Findings include no SRF, abnormal foveal contour, subretinal hyper-reflective material, intraretinal hyper-reflective material, intraretinal fluid (Severe edema with [facility] / IRHM inferior  macula).   Left Eye Quality was good. Central Foveal Thickness: 411. Progression has no prior data. Findings include no SRF, abnormal foveal contour, subretinal hyper-reflective material, intraretinal hyper-reflective material, epiretinal membrane, intraretinal fluid, macular pucker (Severe edema with diffuse IRF, IRHM and SRHM, ERM with +pucker, partial PVD).   Notes *Images captured and stored on drive  Diagnosis / Impression:  OD: Severe edema with SRHM / IRHM inferior macula OS: Severe edema with diffuse IRF, IRHM and SRHM, ERM with +pucker, partial PVD  Clinical management:  See below  Abbreviations: NFP - Normal foveal profile. CME - cystoid macular edema. PED - pigment epithelial detachment. IRF - intraretinal fluid. SRF - subretinal fluid. EZ - ellipsoid zone. ERM - epiretinal membrane. ORA - outer retinal atrophy. ORT - outer retinal tubulation. SRHM - subretinal hyper-reflective material. IRHM - intraretinal hyper-reflective material      Fluorescein Angiography Optos (Transit OS)       Right Eye Progression has no prior data. Early phase findings include leakage, microaneurysm, retinal neovascularization, vascular perfusion defect. Mid/Late phase findings include leakage, microaneurysm, retinal neovascularization, vascular perfusion defect (Large cluster of leaking NV along IT arcades, scattered patches of vascular non-perfusion).   Left Eye Progression has no prior data. Early phase findings include leakage, microaneurysm, neovascularization disc, retinal neovascularization, vascular perfusion defect. Mid/Late phase findings include leakage, microaneurysm, neovascularization disc, retinal  neovascularization, vascular perfusion defect (+NVD and NVE along proximal arcades).   Notes **Images stored on drive**  Impression: PDR OU OD: Large cluster of leaking NV along IT arcades, scattered patches of vascular non-perfusion OS: +NVD and NVE w/ +leakage along proximal arcades,  scattered patches of vascular nonperfusion      Intravitreal Injection, Pharmacologic Agent - OS - Left Eye       Time Out 04/14/2022. 3:30 PM. Confirmed correct patient, procedure, site, and patient consented.   Anesthesia Topical anesthesia was used. Anesthetic medications included Lidocaine 2%, Proparacaine 0.5%.   Procedure Preparation included 5% betadine to ocular surface, eyelid speculum. A supplied needle was used.   Injection: 1.25 mg Bevacizumab 1.66m/0.05ml   Route: Intravitreal, Site: Left Eye   NDC:[id number] Lot:[id number] Expiration date: 06/05/2022   Post-op Post injection exam found visual acuity of at least counting fingers. The patient tolerated the procedure well. There were no complications. The patient received written and verbal post procedure care education.            ASSESSMENT/PLAN:    ICD-10-CM   1. Proliferative diabetic retinopathy of both eyes with macular edema associated with type 2 diabetes mellitus (HCC)  E11.3513 OCT, Retina - OU - Both Eyes    Fluorescein Angiography Optos (Transit OS)    Intravitreal Injection, Pharmacologic Agent - OS - Left Eye    Bevacizumab (AVASTIN) SOLN 1.25 mg    2. Essential hypertension  I10     3. Hypertensive retinopathy of both eyes  H35.033 Fluorescein Angiography Optos (Transit OS)     1. Proliferative diabetic retinopathy, both eyes  - A1c 7.4 on 08.11.23 - The incidence, risk factors for progression, natural history and treatment options for diabetic retinopathy were discussed with patient.   - The need for close monitoring of blood glucose, blood pressure, and serum lipids, avoiding cigarette or any type of tobacco, and the need for long term follow up was also discussed with patient. - exam shows severe edema with scattered MA and DBH, but significant lipid component to edema OU - FA (02.21.24) shows OD: Large cluster of leaking NV along IT arcades, scattered patches of vascular non-perfusion;  OS: +NVD and NVE along proximal arcades - OCT shows severe diabetic macular edema, both eyes  - The natural history, pathology, and characteristics of diabetic macular edema discussed with patient.  A generalized discussion of the major clinical trials concerning treatment of diabetic macular edema (ETDRS, DCT, SCORE, RISE / RIDE, and ongoing DRCR net studies) was completed.  This discussion included mention of the various approaches to treating diabetic macular edema (observation, laser photocoagulation, anti-VEGF injections with lucentis / Avastin / Eylea, steroid injections with Kenalog / Ozurdex, and intraocular surgery with vitrectomy).  The goal hemoglobin A1C of 6-7 was discussed, as well as importance of smoking cessation and hypertension control.  Need for ongoing treatment and monitoring were specifically discussed with reference to chronic nature of diabetic macular edema. - recommend IVA OU #1, but will start with just OS today, 02.21.24 - pt wishes to proceed - RBA of procedure discussed, questions answered - IVA informed consent obtained and signed, 02.24.24 (OU) - see procedure note - f/u Friday morning -- DFE/OCT, possible injection OD  2. Hyperlipidemia  - review of labs shows severely elevated cholesterol and lipids  - 08.11.23:  Trig  785 Chol  598  LDL  410 - likely contributing to severe edema and exudates  3,4. Hypertensive retinopathy OU - discussed importance of  tight BP control - monitor   Ophthalmic Meds Ordered this visit:  Meds ordered this encounter  Medications   Bevacizumab (AVASTIN) SOLN 1.25 mg     Return in about 2 days (around 04/16/2022) for f/u PDR OU, DFE, OCT.  There are no Patient Instructions on file for this visit.   Explained the diagnoses, plan, and follow up with the patient and they expressed understanding.  Patient expressed understanding of the importance of proper follow up care.    This document serves as a record of services  personally performed by Gardiner Sleeper, MD, PhD. It was created on their behalf by Orvan Falconer, an ophthalmic technician. The creation of this record is the provider's dictation and/or activities during the visit.    Electronically signed by: Orvan Falconer, OA, 04/14/22  5:18 PM  This document serves as a record of services personally performed by Gardiner Sleeper, MD, PhD. It was created on their behalf by San Jetty. Owens Shark, OA an ophthalmic technician. The creation of this record is the provider's dictation and/or activities during the visit.    Electronically signed by: San Jetty. Owens Shark, [place] 02.21.2024 5:18 PM   Gardiner Sleeper, M.D., Ph.D. Diseases & Surgery of the Retina and Vitreous [facility]  I have reviewed the above documentation for accuracy and completeness, and I agree with the above. Gardiner Sleeper, M.D., Ph.D. 04/14/22 5:28 PM   Abbreviations: M myopia (nearsighted); A astigmatism; H hyperopia (farsighted); P presbyopia; Mrx spectacle prescription;  CTL contact lenses; OD right eye; OS left eye; OU both eyes  XT exotropia; ET esotropia; PEK punctate epithelial keratitis; PEE punctate epithelial erosions; DES dry eye syndrome; MGD meibomian gland dysfunction; ATs artificial tears; PFAT's preservative free artificial tears; [place] nuclear sclerotic cataract; PSC posterior subcapsular cataract; ERM epi-retinal membrane; PVD posterior vitreous detachment; RD retinal detachment; DM diabetes mellitus; DR diabetic retinopathy; NPDR non-proliferative diabetic retinopathy; PDR proliferative diabetic retinopathy; CSME clinically significant macular edema; DME diabetic macular edema; dbh dot blot hemorrhages; CWS cotton wool spot; POAG primary open angle glaucoma; C/D cup-to-disc ratio; HVF humphrey visual field; GVF goldmann visual field; OCT optical coherence tomography; IOP intraocular pressure; BRVO Branch retinal vein occlusion; CRVO central retinal vein occlusion;  CRAO central retinal artery occlusion; BRAO branch retinal artery occlusion; RT retinal tear; SB scleral buckle; PPV pars plana vitrectomy; VH Vitreous hemorrhage; PRP panretinal laser photocoagulation; IVK intravitreal kenalog; VMT vitreomacular traction; MH Macular hole;  NVD neovascularization of the disc; NVE neovascularization elsewhere; AREDS age related eye disease study; ARMD age related macular degeneration; POAG primary open angle glaucoma; EBMD epithelial/anterior basement membrane dystrophy; ACIOL anterior chamber intraocular lens; IOL intraocular lens; PCIOL posterior chamber intraocular lens; Phaco/IOL phacoemulsification with intraocular lens placement; [place] photorefractive keratectomy; LASIK laser assisted in situ keratomileusis; HTN hypertension; DM diabetes mellitus; COPD chronic obstructive pulmonary disease

## 2022-04-14 ENCOUNTER — Encounter (INDEPENDENT_AMBULATORY_CARE_PROVIDER_SITE_OTHER): Payer: Self-pay | Admitting: Ophthalmology

## 2022-04-14 ENCOUNTER — Ambulatory Visit (INDEPENDENT_AMBULATORY_CARE_PROVIDER_SITE_OTHER): Payer: 59 | Admitting: Ophthalmology

## 2022-04-14 VITALS — BP 96/69 | HR 105

## 2022-04-14 DIAGNOSIS — H35033 Hypertensive retinopathy, bilateral: Secondary | ICD-10-CM | POA: Diagnosis not present

## 2022-04-14 DIAGNOSIS — H3581 Retinal edema: Secondary | ICD-10-CM

## 2022-04-14 DIAGNOSIS — I10 Essential (primary) hypertension: Secondary | ICD-10-CM | POA: Diagnosis not present

## 2022-04-14 DIAGNOSIS — E113513 Type 2 diabetes mellitus with proliferative diabetic retinopathy with macular edema, bilateral: Secondary | ICD-10-CM | POA: Diagnosis not present

## 2022-04-14 DIAGNOSIS — E785 Hyperlipidemia, unspecified: Secondary | ICD-10-CM

## 2022-04-14 MED ORDER — BEVACIZUMAB CHEMO INJECTION 1.25MG/0.05ML SYRINGE FOR KALEIDOSCOPE
1.2500 mg | INTRAVITREAL | Status: AC | PRN
  Administered 2022-04-14: 1.25 mg via INTRAVITREAL

## 2022-04-15 ENCOUNTER — Inpatient Hospital Stay: Payer: Medicaid - Out of State | Attending: Hematology

## 2022-04-15 NOTE — Progress Notes (Signed)
[facility] Note  04/16/2022     CHIEF COMPLAINT Patient presents for Retina Follow Up   HISTORY OF PRESENT ILLNESS: Jose Koch is a 52 y.o. male who presents to the clinic today for:   HPI     Retina Follow Up   Patient presents with  Diabetic Retinopathy.  In both eyes.  This started 2 days ago.  Duration of 2 days.  Since onset it is stable.  I, the attending physician,  performed the HPI with the patient and updated documentation appropriately.        Comments   2 day retina follow up PDR OU and IVA OD pt states vision maybe little clearer today he denies any flashes or floaters last glucose reading was 170 yesterday       Last edited by Bernarda Caffey, MD on 04/16/2022 12:46 PM.    Pt here for IVA OD  Referring physician: Minna Antis, DO [address] [place] [address]  HISTORICAL INFORMATION:   Selected notes from the MEDICAL RECORD NUMBER Referred by Dr. Madilyn Hook LEE:  Ocular Hx- PMH-    CURRENT MEDICATIONS: No current outpatient medications on file. (Ophthalmic Drugs)   No current facility-administered medications for this visit. (Ophthalmic Drugs)   Current Outpatient Medications (Other)  Medication Sig   aspirin EC 81 MG tablet Take 81 mg by mouth daily. Swallow whole.   blood glucose meter kit and supplies KIT Dispense based on patient and insurance preference (something with affordable test strips). Use to check glucose twice daily as directed.   fluticasone (FLONASE) 50 MCG/ACT nasal spray Place 2 sprays into both nostrils daily. (Patient not taking: Reported on [id number])   folic acid (FOLVITE) 1 MG tablet Take 1 tablet (1 mg total) by mouth daily.   glucose blood (ACCU-CHEK GUIDE) test strip Use as instructed to monitor glucose twice daily, before breakfast and before bed   Insulin Glargine (BASAGLAR KWIKPEN) 100 UNIT/ML Inject 30 Units into the skin at bedtime.   Insulin Pen Needle 31G X 5 MM MISC  1 Units by Does not apply route daily.   levETIRAcetam (KEPPRA) 500 MG tablet Take 1 tablet (500 mg total) by mouth 2 (two) times daily. (Patient not taking: Reported on 04/14/2022)   metFORMIN (GLUCOPHAGE XR) 500 MG 24 hr tablet Take 1 tablet (500 mg total) by mouth daily with breakfast.   metoCLOPramide (REGLAN) 10 MG tablet Take 10 mg by mouth 3 (three) times daily before meals.   Misc Natural Products (OSTEO BI-FLEX ADV DOUBLE ST PO) Take 1 tablet by mouth.   OneTouch Delica Lancets [id number] MISC PLEASE SEE ATTACHED FOR DETAILED DIRECTIONS   pantoprazole (PROTONIX) 40 MG tablet TAKE 1 TABLET BY MOUTH 2 TIMES DAILY BEFORE A MEAL 30 MINUTES BEFORE BREAKFAST AND DINNER   ticagrelor (BRILINTA) 90 MG TABS tablet Take 1 tablet (90 mg total) by mouth 2 (two) times daily.   No current facility-administered medications for this visit. (Other)   REVIEW OF SYSTEMS: ROS   Positive for: Neurological, Eyes Last edited by Parthenia Ames, COT on 04/16/2022  9:22 AM.      ALLERGIES No Known Allergies  PAST MEDICAL HISTORY Past Medical History:  Diagnosis Date   COVID    COVID 02/2020   very sick - admitted to the hospital   Diabetes mellitus without complication ([place])    GERD (gastroesophageal reflux disease)    Headache    High cholesterol  History of kidney stones    Hypertension    was diagnosed 2 years ago, never treated and states BP is always - documented 07/13/21   Ichthyosis vulgaris    Pneumonia    Sleep apnea    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   Past Surgical History:  Procedure Laterality Date   IR ANGIO INTRA EXTRACRAN SEL COM CAROTID INNOMINATE BILAT MOD SED  04/17/2021   IR ANGIO INTRA EXTRACRAN SEL INTERNAL CAROTID UNI R MOD SED  07/17/2021   IR ANGIO VERTEBRAL SEL VERTEBRAL BILAT MOD SED  04/17/2021   IR CT HEAD LTD  07/15/2021   IR INTRA CRAN STENT  07/15/2021   IR RADIOLOGIST EVAL & MGMT  03/05/2021   IR RADIOLOGIST EVAL & MGMT  05/04/2021   IR RADIOLOGIST  EVAL & MGMT  07/31/2021   IR US GUIDE VASC ACCESS RIGHT  04/17/2021   IR US GUIDE VASC ACCESS RIGHT  07/15/2021   RADIOLOGY WITH ANESTHESIA N/A 07/15/2021   Procedure: STENTING;  Surgeon: Luanne Bras, MD;  Location: [facility];  Service: Radiology;  Laterality: N/A;   TONSILLECTOMY     wisdom teeth removal     FAMILY HISTORY Family History  Family history unknown: Yes   SOCIAL HISTORY Social History   Tobacco Use   Smoking status: Never   Smokeless tobacco: Never  Vaping Use   Vaping Use: Never used  Substance Use Topics   Alcohol use: Not Currently   Drug use: Never       OPHTHALMIC EXAM:  Base Eye Exam     Visual Acuity (Snellen - Linear)       Right Left   Dist cc 20/80 CF at 3'   Dist ph cc NI NI    Correction: Glasses         Tonometry (Tonopen, 9:27 AM)       Right Left   Pressure 12 15         Pupils       Pupils Dark Light Shape React APD   Right PERRL 3 2 Round Brisk None   Left PERRL 3 2 Round Brisk None         Visual Fields       Left Right    Full Full         Extraocular Movement       Right Left    Full, Ortho Full, Ortho         Neuro/Psych     Oriented x3: Yes   Mood/Affect: Normal         Dilation     Both eyes: 2.5% Phenylephrine @ 9:27 AM           Slit Lamp and Fundus Exam     Slit Lamp Exam       Right Left   Lids/Lashes Dermatochalasis - upper lid, Meibomian gland dysfunction, +Scurf Dermatochalasis - upper lid, Meibomian gland dysfunction, +Scurf   Conjunctiva/Sclera White and quiet White and quiet   Cornea trace PEE, mild tear film debris 1+PEE, mild tear film debris   Anterior Chamber deep and clear deep and clear   Iris Round and dilated, No NVI Round and dilated, No NVI   Lens Clear Clear   Anterior Vitreous Vitreous syneresis mild syneresis         Fundus Exam       Right Left   Disc Pink and Sharp Pink, blurred margin, fine NVD, 360 exudate, scattered  heme   C/D Ratio 0.2     Macula Blunted foveal reflex, severe edema with severe exudation, scattered DBH Blunted foveal reflex, severe edema and exudation, scattered DBH   Vessels attenuated, Tortuous, +NVE greatest along inferior arcades, +Sheathing attenuated, Tortuous, +NV, +Sheathing   Periphery Attached, scattered MA/DBH, heavy exudates greatest IN periphery Attached, severe exudation posteriorly, scattered MA / DBH           Refraction     Wearing Rx       Sphere Cylinder Axis Add   Right -2.50 +1.00 049 +1.00   Left -0.75 +1.25 178 +1.00            IMAGING AND PROCEDURES  Imaging and Procedures for 04/16/2022  OCT, Retina - OU - Both Eyes       Right Eye Quality was good. Central Foveal Thickness: 560. Progression has been stable. Findings include no SRF, abnormal foveal contour, subretinal hyper-reflective material, intraretinal hyper-reflective material, intraretinal fluid (Severe edema with [facility] / IRHM inferior macula -- persistent).   Left Eye Quality was good. Central Foveal Thickness: 548. Progression has been stable. Findings include no SRF, abnormal foveal contour, subretinal hyper-reflective material, intraretinal hyper-reflective material, epiretinal membrane, intraretinal fluid, macular pucker (Severe edema with diffuse IRF, IRHM and SRHM, ERM with +pucker, partial PVD -- persistent).   Notes *Images captured and stored on drive  Diagnosis / Impression:  OD: Severe edema with SRHM / IRHM inferior macula -- persistent OS: Severe edema with diffuse IRF, IRHM and SRHM, ERM with +pucker, partial PVD  Clinical management:  See below  Abbreviations: NFP - Normal foveal profile. CME - cystoid macular edema. PED - pigment epithelial detachment. IRF - intraretinal fluid. SRF - subretinal fluid. EZ - ellipsoid zone. ERM - epiretinal membrane. ORA - outer retinal atrophy. ORT - outer retinal tubulation. SRHM - subretinal hyper-reflective material. IRHM - intraretinal hyper-reflective  material      Intravitreal Injection, Pharmacologic Agent - OD - Right Eye       Time Out 04/16/2022. 10:03 AM. Confirmed correct patient, procedure, site, and patient consented.   Anesthesia Topical anesthesia was used. Anesthetic medications included Lidocaine 2%, Proparacaine 0.5%.   Procedure Preparation included 5% betadine to ocular surface, eyelid speculum. A supplied needle was used.   Injection: 1.25 mg Bevacizumab 1.'25mg'$ /0.9m   Route: Intravitreal, Site: Right Eye   NDC: [id number] Expiration date: 06/06/2022             ASSESSMENT/PLAN:    ICD-10-CM   1. Proliferative diabetic retinopathy of both eyes with macular edema associated with type 2 diabetes mellitus (HCC)  E11.3513 OCT, Retina - OU - Both Eyes    Intravitreal Injection, Pharmacologic Agent - OD - Right Eye    Bevacizumab (AVASTIN) SOLN 1.25 mg    2. Essential hypertension  I10     3. Hypertensive retinopathy of both eyes  H35.033       1. Proliferative diabetic retinopathy, both eyes  - A1c 7.4 on 08.11.23  - s/p IVA OS #1 (02.22.24) - exam shows severe edema with scattered MA and DBH, but significant lipid component to edema OU - FA (02.21.24) shows OD: Large cluster of leaking NV along IT arcades, scattered patches of vascular non-perfusion; OS: +NVD and NVE along proximal arcades - OCT shows severe diabetic macular edema, both eyes  - recommend IVA OD #1 today, 02.23.24 - pt wishes to proceed - RBA of procedure discussed, questions answered - IVA informed consent obtained and signed,  02.24.24 (OU) - see procedure note - f/u 4 weeks -- DFE/OCT, possible injection OU  2. Hyperlipidemia  - review of labs shows severely elevated cholesterol and lipids  - 08.11.23:  Trig  785 Chol  598  LDL  410 - likely contributing to severe edema and exudates  3,4. Hypertensive retinopathy OU - discussed importance of tight BP control - continue to monitor   Ophthalmic Meds  Ordered this visit:  Meds ordered this encounter  Medications   Bevacizumab (AVASTIN) SOLN 1.25 mg     Return in about 4 weeks (around 05/14/2022) for f/u, PDR OU, DFE, OCT.  There are no Patient Instructions on file for this visit.   Explained the diagnoses, plan, and follow up with the patient and they expressed understanding.  Patient expressed understanding of the importance of proper follow up care.    This document serves as a record of services personally performed by Gardiner Sleeper, MD, PhD. It was created on their behalf by Joetta Manners COT, an ophthalmic technician. The creation of this record is the provider's dictation and/or activities during the visit.    Electronically signed by: Joetta Manners COT 02.22.24 12:47 PM  This document serves as a record of services personally performed by Gardiner Sleeper, MD, PhD. It was created on their behalf by San Jetty. Owens Shark, OA an ophthalmic technician. The creation of this record is the provider's dictation and/or activities during the visit.    Electronically signed by: San Jetty. Marguerita Merles 02.23.2024 12:47 PM  Gardiner Sleeper, M.D., Ph.D. Diseases & Surgery of the Retina and [place] 04/16/2022   I have reviewed the above documentation for accuracy and completeness, and I agree with the above. Gardiner Sleeper, M.D., Ph.D. 04/16/22 12:47 PM   Abbreviations: M myopia (nearsighted); A astigmatism; H hyperopia (farsighted); P presbyopia; Mrx spectacle prescription;  CTL contact lenses; OD right eye; OS left eye; OU both eyes  XT exotropia; ET esotropia; PEK punctate epithelial keratitis; PEE punctate epithelial erosions; DES dry eye syndrome; MGD meibomian gland dysfunction; ATs artificial tears; PFAT's preservative free artificial tears; [place] nuclear sclerotic cataract; PSC posterior subcapsular cataract; ERM epi-retinal membrane; PVD posterior vitreous detachment; RD retinal detachment; DM  diabetes mellitus; DR diabetic retinopathy; NPDR non-proliferative diabetic retinopathy; PDR proliferative diabetic retinopathy; CSME clinically significant macular edema; DME diabetic macular edema; dbh dot blot hemorrhages; CWS cotton wool spot; POAG primary open angle glaucoma; C/D cup-to-disc ratio; HVF humphrey visual field; GVF goldmann visual field; OCT optical coherence tomography; IOP intraocular pressure; BRVO Branch retinal vein occlusion; CRVO central retinal vein occlusion; CRAO central retinal artery occlusion; BRAO branch retinal artery occlusion; RT retinal tear; SB scleral buckle; PPV pars plana vitrectomy; VH Vitreous hemorrhage; PRP panretinal laser photocoagulation; IVK intravitreal kenalog; VMT vitreomacular traction; MH Macular hole;  NVD neovascularization of the disc; NVE neovascularization elsewhere; AREDS age related eye disease study; ARMD age related macular degeneration; POAG primary open angle glaucoma; EBMD epithelial/anterior basement membrane dystrophy; ACIOL anterior chamber intraocular lens; IOL intraocular lens; PCIOL posterior chamber intraocular lens; Phaco/IOL phacoemulsification with intraocular lens placement; [place] photorefractive keratectomy; LASIK laser assisted in situ keratomileusis; HTN hypertension; DM diabetes mellitus; COPD chronic obstructive pulmonary disease

## 2022-04-16 ENCOUNTER — Ambulatory Visit (INDEPENDENT_AMBULATORY_CARE_PROVIDER_SITE_OTHER): Payer: 59 | Admitting: Ophthalmology

## 2022-04-16 ENCOUNTER — Encounter (INDEPENDENT_AMBULATORY_CARE_PROVIDER_SITE_OTHER): Payer: Self-pay | Admitting: Ophthalmology

## 2022-04-16 DIAGNOSIS — H35033 Hypertensive retinopathy, bilateral: Secondary | ICD-10-CM | POA: Diagnosis not present

## 2022-04-16 DIAGNOSIS — E113513 Type 2 diabetes mellitus with proliferative diabetic retinopathy with macular edema, bilateral: Secondary | ICD-10-CM | POA: Diagnosis not present

## 2022-04-16 DIAGNOSIS — I10 Essential (primary) hypertension: Secondary | ICD-10-CM

## 2022-04-16 MED ORDER — BEVACIZUMAB CHEMO INJECTION 1.25MG/0.05ML SYRINGE FOR KALEIDOSCOPE
1.2500 mg | INTRAVITREAL | Status: AC | PRN
  Administered 2022-04-16: 1.25 mg via INTRAVITREAL

## 2022-04-22 ENCOUNTER — Ambulatory Visit: Payer: Self-pay | Admitting: Physician Assistant

## 2022-05-13 NOTE — Progress Notes (Addendum)
[facility] Note  05/14/2022     CHIEF COMPLAINT Patient presents for Retina Follow Up   HISTORY OF PRESENT ILLNESS: Jose Koch is a 52 y.o. male who presents to the clinic today for:   HPI     Retina Follow Up   Patient presents with  Diabetic Retinopathy.  In both eyes.  This started 4 weeks ago.  I, the attending physician,  performed the HPI with the patient and updated documentation appropriately.        Comments   Patient here for 4 weeks retina follow up for PDR OU. Patient states vision seems to be a little bit better OS. Blurring not as bad as used to be. No eye pain.       Last edited by Bernarda Caffey, MD on 05/14/2022 11:30 AM.     Pt states left eye vision has improved, he did not have any problems with the first injections  Referring physician: Minna Antis, DO [address] [place] [address]  HISTORICAL INFORMATION:   Selected notes from the MEDICAL RECORD NUMBER Referred by Dr. Madilyn Hook LEE:  Ocular Hx- PMH-    CURRENT MEDICATIONS: No current outpatient medications on file. (Ophthalmic Drugs)   No current facility-administered medications for this visit. (Ophthalmic Drugs)   Current Outpatient Medications (Other)  Medication Sig   aspirin EC 81 MG tablet Take 81 mg by mouth daily. Swallow whole.   blood glucose meter kit and supplies KIT Dispense based on patient and insurance preference (something with affordable test strips). Use to check glucose twice daily as directed.   folic acid (FOLVITE) 1 MG tablet Take 1 tablet (1 mg total) by mouth daily.   glucose blood (ACCU-CHEK GUIDE) test strip Use as instructed to monitor glucose twice daily, before breakfast and before bed   Insulin Glargine (BASAGLAR KWIKPEN) 100 UNIT/ML Inject 30 Units into the skin at bedtime.   Insulin Pen Needle 31G X 5 MM MISC 1 Units by Does not apply route daily.   metFORMIN (GLUCOPHAGE XR) 500 MG 24 hr tablet Take 1  tablet (500 mg total) by mouth daily with breakfast.   metoCLOPramide (REGLAN) 10 MG tablet Take 10 mg by mouth 3 (three) times daily before meals.   Misc Natural Products (OSTEO BI-FLEX ADV DOUBLE ST PO) Take 1 tablet by mouth.   OneTouch Delica Lancets [id number] MISC PLEASE SEE ATTACHED FOR DETAILED DIRECTIONS   pantoprazole (PROTONIX) 40 MG tablet TAKE 1 TABLET BY MOUTH 2 TIMES DAILY BEFORE A MEAL 30 MINUTES BEFORE BREAKFAST AND DINNER   ticagrelor (BRILINTA) 90 MG TABS tablet Take 1 tablet (90 mg total) by mouth 2 (two) times daily.   fluticasone (FLONASE) 50 MCG/ACT nasal spray Place 2 sprays into both nostrils daily. (Patient not taking: Reported on 04/14/2022)   levETIRAcetam (KEPPRA) 500 MG tablet Take 1 tablet (500 mg total) by mouth 2 (two) times daily. (Patient not taking: Reported on 04/14/2022)   No current facility-administered medications for this visit. (Other)   REVIEW OF SYSTEMS: ROS   Positive for: Neurological, Eyes Last edited by Theodore Demark, COA on 05/14/2022  9:26 AM.     ALLERGIES No Known Allergies  PAST MEDICAL HISTORY Past Medical History:  Diagnosis Date   COVID    COVID 02/2020   very sick - admitted to the hospital   Diabetes mellitus without complication ([place])    GERD (gastroesophageal reflux disease)    Headache  High cholesterol    History of kidney stones    Hypertension    was diagnosed 2 years ago, never treated and states BP is always - documented 07/13/21   Ichthyosis vulgaris    Pneumonia    Sleep apnea    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   Past Surgical History:  Procedure Laterality Date   IR ANGIO INTRA EXTRACRAN SEL COM CAROTID INNOMINATE BILAT MOD SED  04/17/2021   IR ANGIO INTRA EXTRACRAN SEL INTERNAL CAROTID UNI R MOD SED  07/17/2021   IR ANGIO VERTEBRAL SEL VERTEBRAL BILAT MOD SED  04/17/2021   IR CT HEAD LTD  07/15/2021   IR INTRA CRAN STENT  07/15/2021   IR RADIOLOGIST EVAL & MGMT  03/05/2021   IR RADIOLOGIST EVAL & MGMT   05/04/2021   IR RADIOLOGIST EVAL & MGMT  07/31/2021   IR US GUIDE VASC ACCESS RIGHT  04/17/2021   IR US GUIDE VASC ACCESS RIGHT  07/15/2021   RADIOLOGY WITH ANESTHESIA N/A 07/15/2021   Procedure: STENTING;  Surgeon: Luanne Bras, MD;  Location: [facility];  Service: Radiology;  Laterality: N/A;   TONSILLECTOMY     wisdom teeth removal     FAMILY HISTORY Family History  Family history unknown: Yes   SOCIAL HISTORY Social History   Tobacco Use   Smoking status: Never   Smokeless tobacco: Never  Vaping Use   Vaping Use: Never used  Substance Use Topics   Alcohol use: Not Currently   Drug use: Never       OPHTHALMIC EXAM:  Base Eye Exam     Visual Acuity (Snellen - Linear)       Right Left   Dist cc 20/80 -2 20/200    Correction: Glasses  OS looking to the side.        Tonometry (Tonopen, 9:24 AM)       Right Left   Pressure 17 13         Pupils       Dark Light Shape React APD   Right 3 2 Round Brisk None   Left 3 2 Round Brisk None         Visual Fields (Counting fingers)       Left Right    Full Full         Extraocular Movement       Right Left    Full, Ortho Full, Ortho         Neuro/Psych     Oriented x3: Yes   Mood/Affect: Normal         Dilation     Both eyes: 1.0% Mydriacyl, 2.5% Phenylephrine @ 9:23 AM           Slit Lamp and Fundus Exam     Slit Lamp Exam       Right Left   Lids/Lashes Dermatochalasis - upper lid, Meibomian gland dysfunction, +Scurf Dermatochalasis - upper lid, Meibomian gland dysfunction, +Scurf   Conjunctiva/Sclera White and quiet White and quiet   Cornea trace PEE, mild tear film debris 1+PEE, mild tear film debris   Anterior Chamber deep and clear deep and clear   Iris Round and dilated, No NVI Round and dilated, No NVI   Lens Clear Clear   Anterior Vitreous Vitreous syneresis mild syneresis         Fundus Exam       Right Left   Disc Pink and Sharp Pink, blurred margin, fine NVD,  360 exudates, scattered heme -- all improving   C/D Ratio 0.2 0.1   Macula Blunted foveal reflex, severe edema with severe exudation, scattered DBH Blunted foveal reflex, severe edema and exudation, scattered DBH -- all improved   Vessels attenuated, Tortuous, +NVE greatest along inferior arcades, +Sheathing -- improving attenuated, Tortuous, +NV, +Sheathing -- improving   Periphery Attached, scattered MA/DBH, heavy exudates greatest IN periphery -- improving Attached, severe exudation posteriorly, scattered MA / DBH           Refraction     Wearing Rx       Sphere Cylinder Axis Add   Right -2.50 +1.00 049 +1.00   Left -0.75 +1.25 178 +1.00            IMAGING AND PROCEDURES  Imaging and Procedures for 05/14/2022  OCT, Retina - OU - Both Eyes       Right Eye Quality was good. Central Foveal Thickness: 298. Progression has improved. Findings include no SRF, abnormal foveal contour, subretinal hyper-reflective material, intraretinal hyper-reflective material, intraretinal fluid, vitreomacular adhesion (Interval improvement in IRF, IRHM, SRHM and foveal contour).   Left Eye Quality was good. Central Foveal Thickness: 512. Progression has improved. Findings include no SRF, abnormal foveal contour, subretinal hyper-reflective material, intraretinal hyper-reflective material, epiretinal membrane, intraretinal fluid, lamellar hole, macular pucker (Mild interval improvement in diffuse edema and IRHM, ERM with lamellar macular hole).   Notes *Images captured and stored on drive  Diagnosis / Impression:  OD: Interval improvement in IRF, IRHM, SRHM and foveal contour OS: Mild interval improvement in diffuse edema and IRHM; ERM with lamellar macular hole  Clinical management:  See below  Abbreviations: NFP - Normal foveal profile. CME - cystoid macular edema. PED - pigment epithelial detachment. IRF - intraretinal fluid. SRF - subretinal fluid. EZ - ellipsoid zone. ERM - epiretinal  membrane. ORA - outer retinal atrophy. ORT - outer retinal tubulation. SRHM - subretinal hyper-reflective material. IRHM - intraretinal hyper-reflective material      Intravitreal Injection, Pharmacologic Agent - OD - Right Eye       Time Out 05/14/2022. 9:59 AM. Confirmed correct patient, procedure, site, and patient consented.   Anesthesia Topical anesthesia was used. Anesthetic medications included Lidocaine 2%, Proparacaine 0.5%.   Procedure Preparation included 5% betadine to ocular surface, eyelid speculum. A supplied needle was used.   Injection: 1.25 mg Bevacizumab 1.25mg /0.93ml   Route: Intravitreal, Site: Right Eye   NDC: B9831080, [id number], Expiration date: 06/20/2022   Post-op Post injection exam found visual acuity of at least counting fingers. The patient tolerated the procedure well. There were no complications. The patient received written and verbal post procedure care education.      Intravitreal Injection, Pharmacologic Agent - OS - Left Eye       Time Out 05/14/2022. 9:59 AM. Confirmed correct patient, procedure, site, and patient consented.   Anesthesia Topical anesthesia was used. Anesthetic medications included Lidocaine 2%, Proparacaine 0.5%.   Procedure Preparation included 5% betadine to ocular surface, eyelid speculum. A (32g) needle was used.   Injection: 1.25 mg Bevacizumab 1.25mg /0.46ml   Route: Intravitreal, Site: Left Eye   NDC: B9831080, Lot: [id number], Expiration date: 08/14/2022   Post-op Post injection exam found visual acuity of at least counting fingers. The patient tolerated the procedure well. There were no complications. The patient received written and verbal post procedure care education.              ASSESSMENT/PLAN:    ICD-10-CM   1.  Proliferative diabetic retinopathy of both eyes with macular edema associated with type 2 diabetes mellitus (HCC)  E11.3513 OCT, Retina - OU - Both Eyes    Intravitreal  Injection, Pharmacologic Agent - OD - Right Eye    Intravitreal Injection, Pharmacologic Agent - OS - Left Eye    Bevacizumab (AVASTIN) SOLN 1.25 mg    Bevacizumab (AVASTIN) SOLN 1.25 mg    2. Hyperlipidemia associated with type 2 diabetes mellitus ([place])  E11.69    E78.5     3. Essential hypertension  I10     4. Hypertensive retinopathy of both eyes  H35.033        1. Proliferative diabetic retinopathy, both eyes  - A1c 7.4 on 08.11.23  - s/p IVA OS #1 (02.22.24)   -s/p IVA OD #1 (02.23.24) - exam shows severe edema with scattered MA and DBH, but significant lipid component to edema OU - FA (02.21.24) shows OD: Large cluster of leaking NV along IT arcades, scattered patches of vascular non-perfusion; OS: +NVD and NVE along proximal arcades - OCT shows OD: Interval improvement in IRF, IRHM, SRHM and foveal contour; OS: Mild interval improvement in diffuse edema and IRHM; ERM with lamellar macular hole - recommend IVA OU #2 today, 03.22.24  - pt wishes to proceed - RBA of procedure discussed, questions answered - IVA informed consent obtained and signed, 02.24.24 (OU) - see procedure note - f/u 4 weeks -- DFE/OCT, possible injection OU  2. Hyperlipidemia  - review of labs shows severely elevated cholesterol and lipids  - 08.11.23:  Trig  785 Chol  598  LDL  410 - likely contributing to severe edema and exudates  3,4. Hypertensive retinopathy OU - discussed importance of tight BP control - continue to monitor   Ophthalmic Meds Ordered this visit:  Meds ordered this encounter  Medications   Bevacizumab (AVASTIN) SOLN 1.25 mg   Bevacizumab (AVASTIN) SOLN 1.25 mg     Return in about 4 weeks (around 06/11/2022) for f/u PDR OU, DFE, OCT.  There are no Patient Instructions on file for this visit.   This document serves as a record of services personally performed by Gardiner Sleeper, MD, PhD. It was created on their behalf by Joetta Manners COT, an ophthalmic technician.  The creation of this record is the provider's dictation and/or activities during the visit.    Electronically signed by: Joetta Manners COT 03.21.24 11:55 AM  This document serves as a record of services personally performed by Gardiner Sleeper, MD, PhD. It was created on their behalf by San Jetty. Owens Shark, OA an ophthalmic technician. The creation of this record is the provider's dictation and/or activities during the visit.    Electronically signed by: San Jetty. Owens Shark, OA @TODAY @ 11:55 AM  Gardiner Sleeper, M.D., Ph.D. Diseases & Surgery of the Retina and [place] 05/14/2022   I have reviewed the above documentation for accuracy and completeness, and I agree with the above. Gardiner Sleeper, M.D., Ph.D. 05/14/22 11:55 AM   Abbreviations: M myopia (nearsighted); A astigmatism; H hyperopia (farsighted); P presbyopia; Mrx spectacle prescription;  CTL contact lenses; OD right eye; OS left eye; OU both eyes  XT exotropia; ET esotropia; PEK punctate epithelial keratitis; PEE punctate epithelial erosions; DES dry eye syndrome; MGD meibomian gland dysfunction; ATs artificial tears; PFAT's preservative free artificial tears; [place] nuclear sclerotic cataract; PSC posterior subcapsular cataract; ERM epi-retinal membrane; PVD posterior vitreous detachment; RD retinal detachment; DM diabetes mellitus; DR  diabetic retinopathy; NPDR non-proliferative diabetic retinopathy; PDR proliferative diabetic retinopathy; CSME clinically significant macular edema; DME diabetic macular edema; dbh dot blot hemorrhages; CWS cotton wool spot; POAG primary open angle glaucoma; C/D cup-to-disc ratio; HVF humphrey visual field; GVF goldmann visual field; OCT optical coherence tomography; IOP intraocular pressure; BRVO Branch retinal vein occlusion; CRVO central retinal vein occlusion; CRAO central retinal artery occlusion; BRAO branch retinal artery occlusion; RT retinal tear; SB scleral buckle;  PPV pars plana vitrectomy; VH Vitreous hemorrhage; PRP panretinal laser photocoagulation; IVK intravitreal kenalog; VMT vitreomacular traction; MH Macular hole;  NVD neovascularization of the disc; NVE neovascularization elsewhere; AREDS age related eye disease study; ARMD age related macular degeneration; POAG primary open angle glaucoma; EBMD epithelial/anterior basement membrane dystrophy; ACIOL anterior chamber intraocular lens; IOL intraocular lens; PCIOL posterior chamber intraocular lens; Phaco/IOL phacoemulsification with intraocular lens placement; [place] photorefractive keratectomy; LASIK laser assisted in situ keratomileusis; HTN hypertension; DM diabetes mellitus; COPD chronic obstructive pulmonary disease

## 2022-05-14 ENCOUNTER — Ambulatory Visit (INDEPENDENT_AMBULATORY_CARE_PROVIDER_SITE_OTHER): Payer: 59 | Admitting: Ophthalmology

## 2022-05-14 ENCOUNTER — Encounter (INDEPENDENT_AMBULATORY_CARE_PROVIDER_SITE_OTHER): Payer: Self-pay | Admitting: Ophthalmology

## 2022-05-14 DIAGNOSIS — E113513 Type 2 diabetes mellitus with proliferative diabetic retinopathy with macular edema, bilateral: Secondary | ICD-10-CM | POA: Diagnosis not present

## 2022-05-14 DIAGNOSIS — E1169 Type 2 diabetes mellitus with other specified complication: Secondary | ICD-10-CM

## 2022-05-14 DIAGNOSIS — H35033 Hypertensive retinopathy, bilateral: Secondary | ICD-10-CM | POA: Diagnosis not present

## 2022-05-14 DIAGNOSIS — I10 Essential (primary) hypertension: Secondary | ICD-10-CM | POA: Diagnosis not present

## 2022-05-14 MED ORDER — BEVACIZUMAB CHEMO INJECTION 1.25MG/0.05ML SYRINGE FOR KALEIDOSCOPE
1.2500 mg | INTRAVITREAL | Status: AC | PRN
  Administered 2022-05-14: 1.25 mg via INTRAVITREAL

## 2022-06-10 ENCOUNTER — Other Ambulatory Visit (HOSPITAL_COMMUNITY): Payer: Self-pay | Admitting: Interventional Radiology

## 2022-06-10 DIAGNOSIS — I671 Cerebral aneurysm, nonruptured: Secondary | ICD-10-CM

## 2022-06-10 DIAGNOSIS — I6529 Occlusion and stenosis of unspecified carotid artery: Secondary | ICD-10-CM

## 2022-06-10 NOTE — Progress Notes (Signed)
[facility] - Clinic Note  06/11/2022     CHIEF COMPLAINT Patient presents for Retina Follow Up   HISTORY OF PRESENT ILLNESS: Jose Koch is a 52 y.o. male who presents to the clinic today for:   HPI     Retina Follow Up   Patient presents with  Diabetic Retinopathy.  In both eyes.  This started weeks ago.  Duration of 4.  I, the attending physician,  performed the HPI with the patient and updated documentation appropriately.        Comments   Patient feels that the vision is slightly improving. He is not using any eye drops at this time. His blood sugar was 193.       Last edited by Rennis Chris, MD on 06/11/2022  3:42 PM.      Pt states his left eye peripheral vision is getting better, pt states he has never been told that he has high cholesterol, he is having a MRI on May 7th to follow up on the aneurysms in his brain   Referring physician: Jarvis Morgan, DO [address] [place],  [place] [address]  HISTORICAL INFORMATION:   Selected notes from the MEDICAL RECORD NUMBER Referred by Dr. Illene Labrador LEE:  Ocular Hx- PMH-    CURRENT MEDICATIONS: No current outpatient medications on file. (Ophthalmic Drugs)   No current facility-administered medications for this visit. (Ophthalmic Drugs)   Current Outpatient Medications (Other)  Medication Sig   aspirin EC 81 MG tablet Take 81 mg by mouth daily. Swallow whole.   blood glucose meter kit and supplies KIT Dispense based on patient and insurance preference (something with affordable test strips). Use to check glucose twice daily as directed.   fluticasone (FLONASE) 50 MCG/ACT nasal spray Place 2 sprays into both nostrils daily.   folic acid (FOLVITE) 1 MG tablet Take 1 tablet (1 mg total) by mouth daily.   glucose blood (ACCU-CHEK GUIDE) test strip Use as instructed to monitor glucose twice daily, before breakfast and before bed   Insulin Glargine (BASAGLAR KWIKPEN) 100 UNIT/ML Inject 30  Units into the skin at bedtime.   Insulin Pen Needle 31G X 5 MM MISC 1 Units by Does not apply route daily.   levETIRAcetam (KEPPRA) 500 MG tablet Take 1 tablet (500 mg total) by mouth 2 (two) times daily.   metFORMIN (GLUCOPHAGE XR) 500 MG 24 hr tablet Take 1 tablet (500 mg total) by mouth daily with breakfast.   metoCLOPramide (REGLAN) 10 MG tablet Take 10 mg by mouth 3 (three) times daily before meals.   Misc Natural Products (OSTEO BI-FLEX ADV DOUBLE ST PO) Take 1 tablet by mouth.   OneTouch Delica Lancets 33G MISC PLEASE SEE ATTACHED FOR DETAILED DIRECTIONS   pantoprazole (PROTONIX) 40 MG tablet TAKE 1 TABLET BY MOUTH 2 TIMES DAILY BEFORE A MEAL 30 MINUTES BEFORE BREAKFAST AND DINNER   ticagrelor (BRILINTA) 90 MG TABS tablet Take 1 tablet (90 mg total) by mouth 2 (two) times daily.   No current facility-administered medications for this visit. (Other)   REVIEW OF SYSTEMS: ROS   Positive for: Neurological, Endocrine, Eyes Last edited by Julieanne Cotton, COT on 06/11/2022  8:12 AM.      ALLERGIES No Known Allergies  PAST MEDICAL HISTORY Past Medical History:  Diagnosis Date   COVID    COVID 02/2020   very sick - admitted to the hospital   Diabetes mellitus without complication    GERD (gastroesophageal reflux  disease)    Headache    High cholesterol    History of kidney stones    Hypertension    was diagnosed 2 years ago, never treated and states BP is always - documented 07/13/21   Ichthyosis vulgaris    Pneumonia    Sleep apnea    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   Past Surgical History:  Procedure Laterality Date   IR ANGIO INTRA EXTRACRAN SEL COM CAROTID INNOMINATE BILAT MOD SED  04/17/2021   IR ANGIO INTRA EXTRACRAN SEL INTERNAL CAROTID UNI R MOD SED  07/17/2021   IR ANGIO VERTEBRAL SEL VERTEBRAL BILAT MOD SED  04/17/2021   IR CT HEAD LTD  07/15/2021   IR INTRA CRAN STENT  07/15/2021   IR RADIOLOGIST EVAL & MGMT  03/05/2021   IR RADIOLOGIST EVAL & MGMT   05/04/2021   IR RADIOLOGIST EVAL & MGMT  07/31/2021   IR US GUIDE VASC ACCESS RIGHT  04/17/2021   IR US GUIDE VASC ACCESS RIGHT  07/15/2021   RADIOLOGY WITH ANESTHESIA N/A 07/15/2021   Procedure: STENTING;  Surgeon: Julieanne Cotton, MD;  Location: [facility] OR;  Service: Radiology;  Laterality: N/A;   TONSILLECTOMY     wisdom teeth removal     FAMILY HISTORY Family History  Family history unknown: Yes   SOCIAL HISTORY Social History   Tobacco Use   Smoking status: Never   Smokeless tobacco: Never  Vaping Use   Vaping Use: Never used  Substance Use Topics   Alcohol use: Not Currently   Drug use: Never       OPHTHALMIC EXAM:  Base Eye Exam     Visual Acuity (Snellen - Linear)       Right Left   Dist cc 20/70 +2 20/200 -1   Dist ph cc NI NI    Correction: Glasses         Tonometry (Tonopen, 8:17 AM)       Right Left   Pressure 19 17         Pupils       Dark Light Shape React APD   Right 3 2 Round Brisk None   Left 3 2 Round Brisk None         Visual Fields       Left Right    Full Full         Extraocular Movement       Right Left    Full, Ortho Full, Ortho         Neuro/Psych     Oriented x3: Yes   Mood/Affect: Normal         Dilation     Both eyes: 1.0% Mydriacyl, 2.5% Phenylephrine @ 8:13 AM           Slit Lamp and Fundus Exam     Slit Lamp Exam       Right Left   Lids/Lashes Dermatochalasis - upper lid, Meibomian gland dysfunction, +Scurf Dermatochalasis - upper lid, Meibomian gland dysfunction, +Scurf   Conjunctiva/Sclera White and quiet White and quiet   Cornea trace PEE, mild tear film debris 1+PEE, mild tear film debris   Anterior Chamber deep and clear deep and clear   Iris Round and dilated, No NVI Round and dilated, No NVI   Lens Clear Clear   Anterior Vitreous Vitreous syneresis mild syneresis         Fundus Exam       Right Left  Disc Pink and Sharp, +heme at 430 Pink, blurred margin, fine NVD, 360  exudates, scattered heme -- all improving   C/D Ratio 0.2 0.1   Macula Blunted foveal reflex, severe edema with severe exudation, scattered DBH Blunted foveal reflex, severe edema and exudation, scattered DBH -- all improved   Vessels attenuated, Tortuous, +NVE greatest along inferior arcades, +Sheathing -- improving attenuated, Tortuous, +NV, +Sheathing -- improving   Periphery Attached, scattered MA/DBH, heavy exudates greatest IN periphery -- improving Attached, severe exudation posteriorly, scattered MA / DBH           Refraction     Wearing Rx       Sphere Cylinder Axis Add   Right -2.50 +1.00 049 +1.00   Left -0.75 +1.25 178 +1.00            IMAGING AND PROCEDURES  Imaging and Procedures for 06/11/2022  OCT, Retina - OU - Both Eyes       Right Eye Quality was good. Central Foveal Thickness: 245. Progression has improved. Findings include no SRF, abnormal foveal contour, subretinal hyper-reflective material, intraretinal hyper-reflective material, intraretinal fluid, vitreomacular adhesion (persistent IRF, IRHM, SRHM -- slightly improved).   Left Eye Quality was good. Central Foveal Thickness: 512. Progression has improved. Findings include no SRF, abnormal foveal contour, subretinal hyper-reflective material, intraretinal hyper-reflective material, epiretinal membrane, intraretinal fluid, lamellar hole, macular pucker (Mild interval improvement in diffuse edema and IRHM, ERM with macular hole).   Notes *Images captured and stored on drive  Diagnosis / Impression:  OD: persistent IRF, IRHM, SRHM -- slightly improved OS: Mild interval improvement in diffuse edema and IRHM; ERM with macular hole  Clinical management:  See below  Abbreviations: NFP - Normal foveal profile. CME - cystoid macular edema. PED - pigment epithelial detachment. IRF - intraretinal fluid. SRF - subretinal fluid. EZ - ellipsoid zone. ERM - epiretinal membrane. ORA - outer retinal atrophy. ORT -  outer retinal tubulation. SRHM - subretinal hyper-reflective material. IRHM - intraretinal hyper-reflective material      Intravitreal Injection, Pharmacologic Agent - OD - Right Eye       Time Out 06/11/2022. 8:14 AM. Confirmed correct patient, procedure, site, and patient consented.   Anesthesia Topical anesthesia was used. Anesthetic medications included Lidocaine 2%, Proparacaine 0.5%.   Procedure Preparation included 5% betadine to ocular surface, eyelid speculum. A (32g) needle was used.   Injection: 1.25 mg Bevacizumab 1.25mg /0.17ml   Route: Intravitreal, Site: Right Eye   NDC: P3213405, Lot: [id number], Expiration date: 09/04/2022   Post-op Post injection exam found visual acuity of at least counting fingers. The patient tolerated the procedure well. There were no complications. The patient received written and verbal post procedure care education.      Intravitreal Injection, Pharmacologic Agent - OS - Left Eye       Time Out 06/11/2022. 8:15 AM. Confirmed correct patient, procedure, site, and patient consented.   Anesthesia Topical anesthesia was used. Anesthetic medications included Lidocaine 2%, Proparacaine 0.5%.   Procedure Preparation included 5% betadine to ocular surface, eyelid speculum. A (32g) needle was used.   Injection: 1.25 mg Bevacizumab 1.25mg /0.16ml   Route: Intravitreal, Site: Left Eye   NDC: P3213405, Lot: [id number], Expiration date: 08/26/2022   Post-op Post injection exam found visual acuity of at least counting fingers. The patient tolerated the procedure well. There were no complications. The patient received written and verbal post procedure care education.  ASSESSMENT/PLAN:    ICD-10-CM   1. Proliferative diabetic retinopathy of both eyes with macular edema associated with type 2 diabetes mellitus  E11.3513 OCT, Retina - OU - Both Eyes    Intravitreal Injection, Pharmacologic Agent - OD - Right Eye     Intravitreal Injection, Pharmacologic Agent - OS - Left Eye    Bevacizumab (AVASTIN) SOLN 1.25 mg    Bevacizumab (AVASTIN) SOLN 1.25 mg    2. Hyperlipidemia associated with type 2 diabetes mellitus  E11.69    E78.5     3. Essential hypertension  I10     4. Hypertensive retinopathy of both eyes  H35.033       1. Proliferative diabetic retinopathy, both eyes  - A1c 7.4 on 08.11.23  - s/p IVA OS #1 (02.22.24), #2 (03.22.24)   -s/p IVA OD #1 (02.23.24),  #2 (03.22.24) - exam shows severe edema with scattered MA and DBH, but significant lipid component to edema OU - FA (02.21.24) shows OD: Large cluster of leaking NV along IT arcades, scattered patches of vascular non-perfusion; OS: +NVD and NVE along proximal arcades - OCT shows OD: persistent IRF, IRHM, SRHM -- slightly improved; OS: Mild interval improvement in diffuse edema and IRHM; ERM with lamellar macular hole - recommend IVA OU #3 today, 04.19.24  - pt wishes to proceed - RBA of procedure discussed, questions answered - IVA informed consent obtained and signed, 02.24.24 (OU) - see procedure note - will check Eylea auth for next visit - f/u 4 weeks -- DFE/OCT, possible injection OU  2. Hyperlipidemia  - review of labs shows severely elevated cholesterol and lipids  - 08.11.23:  Trig  785 Chol  598  LDL  410 - likely contributing to severe edema and exudates  3,4. Hypertensive retinopathy OU - discussed importance of tight BP control - continue to monitor   Ophthalmic Meds Ordered this visit:  Meds ordered this encounter  Medications   Bevacizumab (AVASTIN) SOLN 1.25 mg   Bevacizumab (AVASTIN) SOLN 1.25 mg     Return in about 4 weeks (around 07/09/2022) for f/u PDR OU, DFE, OCT.  There are no Patient Instructions on file for this visit.   This document serves as a record of services personally performed by Karie Chimera, MD, PhD. It was created on their behalf by Berlin Hun COT, The creation of this  record is the provider's dictation and/or activities during the visit.    Electronically signed by: Berlin Hun COT 06/10/2022 2:33 AM  This document serves as a record of services personally performed by Karie Chimera, MD, PhD. It was created on their behalf by Glee Arvin. Manson Passey, OA an ophthalmic technician. The creation of this record is the provider's dictation and/or activities during the visit.    Electronically signed by: Glee Arvin. Kristopher Oppenheim 04.19.2024 2:33 AM  Karie Chimera, M.D., Ph.D. Diseases & Surgery of the Retina and Vitreous [facility] 06/11/2022   I have reviewed the above documentation for accuracy and completeness, and I agree with the above. Karie Chimera, M.D., Ph.D. 06/13/22 2:40 AM   Abbreviations: M myopia (nearsighted); A astigmatism; H hyperopia (farsighted); P presbyopia; Mrx spectacle prescription;  CTL contact lenses; OD right eye; OS left eye; OU both eyes  XT exotropia; ET esotropia; PEK punctate epithelial keratitis; PEE punctate epithelial erosions; DES dry eye syndrome; MGD meibomian gland dysfunction; ATs artificial tears; PFAT's preservative free artificial tears; NSC nuclear sclerotic cataract; PSC posterior subcapsular cataract; ERM  epi-retinal membrane; PVD posterior vitreous detachment; RD retinal detachment; DM diabetes mellitus; DR diabetic retinopathy; NPDR non-proliferative diabetic retinopathy; PDR proliferative diabetic retinopathy; CSME clinically significant macular edema; DME diabetic macular edema; dbh dot blot hemorrhages; CWS cotton wool spot; POAG primary open angle glaucoma; C/D cup-to-disc ratio; HVF humphrey visual field; GVF goldmann visual field; OCT optical coherence tomography; IOP intraocular pressure; BRVO Branch retinal vein occlusion; CRVO central retinal vein occlusion; CRAO central retinal artery occlusion; BRAO branch retinal artery occlusion; RT retinal tear; SB scleral buckle; PPV pars plana  vitrectomy; VH Vitreous hemorrhage; PRP panretinal laser photocoagulation; IVK intravitreal kenalog; VMT vitreomacular traction; MH Macular hole;  NVD neovascularization of the disc; NVE neovascularization elsewhere; AREDS age related eye disease study; ARMD age related macular degeneration; POAG primary open angle glaucoma; EBMD epithelial/anterior basement membrane dystrophy; ACIOL anterior chamber intraocular lens; IOL intraocular lens; PCIOL posterior chamber intraocular lens; Phaco/IOL phacoemulsification with intraocular lens placement; PRK photorefractive keratectomy; LASIK laser assisted in situ keratomileusis; HTN hypertension; DM diabetes mellitus; COPD chronic obstructive pulmonary disease

## 2022-06-11 ENCOUNTER — Ambulatory Visit (INDEPENDENT_AMBULATORY_CARE_PROVIDER_SITE_OTHER): Payer: 59 | Admitting: Ophthalmology

## 2022-06-11 ENCOUNTER — Encounter (INDEPENDENT_AMBULATORY_CARE_PROVIDER_SITE_OTHER): Payer: Self-pay | Admitting: Ophthalmology

## 2022-06-11 DIAGNOSIS — H35033 Hypertensive retinopathy, bilateral: Secondary | ICD-10-CM

## 2022-06-11 DIAGNOSIS — E1169 Type 2 diabetes mellitus with other specified complication: Secondary | ICD-10-CM

## 2022-06-11 DIAGNOSIS — I10 Essential (primary) hypertension: Secondary | ICD-10-CM | POA: Diagnosis not present

## 2022-06-11 DIAGNOSIS — E113513 Type 2 diabetes mellitus with proliferative diabetic retinopathy with macular edema, bilateral: Secondary | ICD-10-CM

## 2022-06-11 MED ORDER — BEVACIZUMAB CHEMO INJECTION 1.25MG/0.05ML SYRINGE FOR KALEIDOSCOPE
1.2500 mg | INTRAVITREAL | Status: AC | PRN
  Administered 2022-06-11: 1.25 mg via INTRAVITREAL
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
## 2022-06-21 ENCOUNTER — Ambulatory Visit: Payer: 59 | Admitting: Neurology

## 2022-06-21 ENCOUNTER — Encounter: Payer: Self-pay | Admitting: Neurology

## 2022-06-21 VITALS — BP 113/74 | HR 105 | Ht 68.0 in | Wt 173.0 lb

## 2022-06-21 DIAGNOSIS — G40209 Localization-related (focal) (partial) symptomatic epilepsy and epileptic syndromes with complex partial seizures, not intractable, without status epilepticus: Secondary | ICD-10-CM

## 2022-06-21 DIAGNOSIS — R55 Syncope and collapse: Secondary | ICD-10-CM

## 2022-06-21 DIAGNOSIS — I671 Cerebral aneurysm, nonruptured: Secondary | ICD-10-CM

## 2022-06-21 DIAGNOSIS — Q283 Other malformations of cerebral vessels: Secondary | ICD-10-CM

## 2022-06-21 MED ORDER — LEVETIRACETAM 500 MG PO TABS: 500.0000 mg | ORAL_TABLET | Freq: Two times a day (BID) | ORAL | 3 refills | Status: DC

## 2022-06-21 NOTE — Patient Instructions (Signed)
Continue levetiracetam 500mg  twice daily Follow up with Dr. Corliss Skains

## 2022-06-24 ENCOUNTER — Other Ambulatory Visit: Payer: Self-pay | Admitting: Nurse Practitioner

## 2022-06-24 ENCOUNTER — Encounter: Payer: Self-pay | Admitting: Neurology

## 2022-06-24 DIAGNOSIS — E1165 Type 2 diabetes mellitus with hyperglycemia: Secondary | ICD-10-CM

## 2022-06-29 ENCOUNTER — Ambulatory Visit (HOSPITAL_COMMUNITY): Payer: 59

## 2022-07-06 NOTE — Progress Notes (Signed)
[facility] - Clinic Note  07/09/2022     CHIEF COMPLAINT Patient presents for Retina Follow Up   HISTORY OF PRESENT ILLNESS: Jose Koch is a 52 y.o. male who presents to the clinic today for:   HPI     Retina Follow Up   Patient presents with  Diabetic Retinopathy.  In both eyes.  This started 4 weeks ago.  Duration of 4 weeks.  Since onset it is stable.        Comments   4 week retina follow up PDR OU and IVA OU pt is reporting vision no vision changes noticed maybe little better his last reading was 183 yesterday       Last edited by Etheleen Mayhew, COT on 07/09/2022  8:48 AM.     Patient feels that the vision is improving.    Referring physician: Jarvis Morgan, DO [address] [place],  [place] [address]  HISTORICAL INFORMATION:   Selected notes from the MEDICAL RECORD NUMBER Referred by Dr. Illene Labrador LEE:  Ocular Hx- PMH-    CURRENT MEDICATIONS: No current outpatient medications on file. (Ophthalmic Drugs)   No current facility-administered medications for this visit. (Ophthalmic Drugs)   Current Outpatient Medications (Other)  Medication Sig   aspirin EC 81 MG tablet Take 81 mg by mouth daily. Swallow whole.   blood glucose meter kit and supplies KIT Dispense based on patient and insurance preference (something with affordable test strips). Use to check glucose twice daily as directed.   fluticasone (FLONASE) 50 MCG/ACT nasal spray Place 2 sprays into both nostrils daily.   folic acid (FOLVITE) 1 MG tablet Take 1 tablet (1 mg total) by mouth daily.   glucose blood (ACCU-CHEK GUIDE) test strip Use as instructed to monitor glucose twice daily, before breakfast and before bed   Insulin Glargine (BASAGLAR KWIKPEN) 100 UNIT/ML Inject 30 Units into the skin at bedtime.   Insulin Pen Needle 31G X 5 MM MISC 1 Units by Does not apply route daily.   levETIRAcetam (KEPPRA) 500 MG tablet Take 1 tablet (500 mg total) by mouth 2  (two) times daily.   metFORMIN (GLUCOPHAGE XR) 500 MG 24 hr tablet Take 1 tablet (500 mg total) by mouth daily with breakfast.   metoCLOPramide (REGLAN) 10 MG tablet Take 10 mg by mouth 3 (three) times daily before meals.   Misc Natural Products (OSTEO BI-FLEX ADV DOUBLE ST PO) Take 1 tablet by mouth.   OneTouch Delica Lancets 33G MISC PLEASE SEE ATTACHED FOR DETAILED DIRECTIONS   pantoprazole (PROTONIX) 40 MG tablet TAKE 1 TABLET BY MOUTH 2 TIMES DAILY BEFORE A MEAL 30 MINUTES BEFORE BREAKFAST AND DINNER   ticagrelor (BRILINTA) 90 MG TABS tablet Take 1 tablet (90 mg total) by mouth 2 (two) times daily.   No current facility-administered medications for this visit. (Other)   REVIEW OF SYSTEMS: ROS   Positive for: Neurological, Endocrine, Eyes Last edited by Etheleen Mayhew, COT on 07/09/2022  8:48 AM.       ALLERGIES No Known Allergies  PAST MEDICAL HISTORY Past Medical History:  Diagnosis Date   COVID    COVID 02/2020   very sick - admitted to the hospital   Diabetes mellitus without complication (HCC)    GERD (gastroesophageal reflux disease)    Headache    High cholesterol    History of kidney stones    Hypertension    was diagnosed 2 years ago, never treated and states  BP is always - documented 07/13/21   Ichthyosis vulgaris    Pneumonia    Sleep apnea    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   Past Surgical History:  Procedure Laterality Date   IR ANGIO INTRA EXTRACRAN SEL COM CAROTID INNOMINATE BILAT MOD SED  04/17/2021   IR ANGIO INTRA EXTRACRAN SEL INTERNAL CAROTID UNI R MOD SED  07/17/2021   IR ANGIO VERTEBRAL SEL VERTEBRAL BILAT MOD SED  04/17/2021   IR CT HEAD LTD  07/15/2021   IR INTRA CRAN STENT  07/15/2021   IR RADIOLOGIST EVAL & MGMT  03/05/2021   IR RADIOLOGIST EVAL & MGMT  05/04/2021   IR RADIOLOGIST EVAL & MGMT  07/31/2021   IR US GUIDE VASC ACCESS RIGHT  04/17/2021   IR US GUIDE VASC ACCESS RIGHT  07/15/2021   RADIOLOGY WITH ANESTHESIA N/A  07/15/2021   Procedure: STENTING;  Surgeon: Julieanne Cotton, MD;  Location: [facility] OR;  Service: Radiology;  Laterality: N/A;   TONSILLECTOMY     wisdom teeth removal     FAMILY HISTORY Family History  Family history unknown: Yes   SOCIAL HISTORY Social History   Tobacco Use   Smoking status: Never   Smokeless tobacco: Never  Vaping Use   Vaping Use: Never used  Substance Use Topics   Alcohol use: Not Currently   Drug use: Never       OPHTHALMIC EXAM:  Base Eye Exam     Visual Acuity (Snellen - Linear)       Right Left   Dist cc 20/60 20/200 -1   Dist ph cc NI NI    Correction: Glasses         Tonometry (Tonopen, 8:51 AM)       Right Left   Pressure 16 18         Pupils       Pupils Dark Light Shape React APD   Right PERRL 3 2 Round Brisk None   Left PERRL 3 2 Round Brisk None         Visual Fields       Left Right    Full Full         Extraocular Movement       Right Left    Full, Ortho Full, Ortho         Neuro/Psych     Oriented x3: Yes   Mood/Affect: Normal         Dilation     Both eyes: 2.5% Phenylephrine @ 8:50 AM           Slit Lamp and Fundus Exam     Slit Lamp Exam       Right Left   Lids/Lashes Dermatochalasis - upper lid, Meibomian gland dysfunction, +Scurf Dermatochalasis - upper lid, Meibomian gland dysfunction, +Scurf   Conjunctiva/Sclera White and quiet White and quiet   Cornea trace PEE, mild tear film debris 1+PEE, mild tear film debris   Anterior Chamber deep and clear deep and clear   Iris Round and dilated, No NVI Round and dilated, No NVI   Lens Clear Clear   Anterior Vitreous Vitreous syneresis mild syneresis         Fundus Exam       Right Left   Disc Pink and Sharp, +heme at 430 Pink, blurred margin, fine NVD, 360 exudates, scattered heme -- all improving   C/D Ratio 0.2 0.1   Macula Blunted foveal reflex, severe edema  with severe exudation, scattered DBH Blunted foveal reflex, severe  edema and exudation, scattered DBH -- all improved   Vessels attenuated, Tortuous, +NVE greatest along inferior arcades, +Sheathing -- improving attenuated, Tortuous, +NV, +Sheathing -- improving   Periphery Attached, scattered MA/DBH, heavy exudates greatest IN periphery -- improving Attached, severe exudation posteriorly, scattered MA / DBH           Refraction     Wearing Rx       Sphere Cylinder Axis Add   Right -2.50 +1.00 049 +1.00   Left -0.75 +1.25 178 +1.00            IMAGING AND PROCEDURES  Imaging and Procedures for 07/09/2022             ASSESSMENT/PLAN:    ICD-10-CM   1. Proliferative diabetic retinopathy of both eyes with macular edema associated with type 2 diabetes mellitus (HCC)  E11.3513 OCT, Retina - OU - Both Eyes    2. Current use of insulin (HCC)  Z79.4     3. Long term (current) use of oral hypoglycemic drugs  Z79.84     4. Hyperlipidemia associated with type 2 diabetes mellitus (HCC)  E11.69    E78.5     5. Essential hypertension  I10     6. Hypertensive retinopathy of both eyes  H35.033      1-3. Proliferative diabetic retinopathy, both eyes  - A1c 7.4 on 08.11.23  - s/p IVA OS #1 (02.22.24), #2 (03.22.24) #3 (04.19.24)   -s/p IVA OD #1 (02.23.24), #2 (03.22.24) #3 (04.19.24) - exam shows severe edema with scattered MA and DBH, but significant lipid component to edema OU - FA (02.21.24) shows OD: Large cluster of leaking NV along IT arcades, scattered patches of vascular non-perfusion; OS: +NVD and NVE along proximal arcades - OCT shows OD: persistent IRF, IRHM, SRHM -- slightly improved; OS: Mild interval improvement in diffuse edema and IRHM; ERM with lamellar macular hole - recommend IVA OD #4 and IVE OS #1 today, 05.17.24  - pt wishes to proceed - RBA of procedure discussed, questions answered - IVA informed consent obtained and signed, 02.24.24 (OU) - IVE informed consent obtained and signed, 05.17.24 (OS) - see procedure  note - will check Eyle[employer] auth for next visit - f/u 4 weeks -- DFE/OCT, possible injection OU  4. Hyperlipidemia  - review of labs shows severely elevated cholesterol and lipids  - 08.11.23:  Trig  785 Chol  598  LDL  410 - likely contributing to severe edema and exudates  5,6. Hypertensive retinopathy OU - discussed importance of tight BP control - continue to monitor   Ophthalmic Meds Ordered this visit:  No orders of the defined types were placed in this encounter.    No follow-ups on file.  There are no Patient Instructions on file for this visit.   This document serves as a record of services personally performed by Karie Chimera, MD, PhD. It was created on their behalf by Berlin Hun COT, an ophthalmic technician. The creation of this record is the provider's dictation and/or activities during the visit.    Electronically signed by: Berlin Hun COT 05.14.20249:20 AM  This document serves as a record of services personally performed by Karie Chimera, MD, PhD. It was created on their behalf by Gerilyn Nestle, COT an ophthalmic technician. The creation of this record is the provider's dictation and/or activities during the visit.    Electronically signed by:  Gerilyn Nestle, COT  05.17.24  9:21 AM   Abbreviations: M myopia (nearsighted); A astigmatism; H hyperopia (farsighted); P presbyopia; Mrx spectacle prescription;  CTL contact lenses; OD right eye; OS left eye; OU both eyes  XT exotropia; ET esotropia; PEK punctate epithelial keratitis; PEE punctate epithelial erosions; DES dry eye syndrome; MGD meibomian gland dysfunction; ATs artificial tears; PFAT's preservative free artificial tears; NSC nuclear sclerotic cataract; PSC posterior subcapsular cataract; ERM epi-retinal membrane; PVD posterior vitreous detachment; RD retinal detachment; DM diabetes mellitus; DR diabetic retinopathy; NPDR non-proliferative diabetic retinopathy; PDR proliferative  diabetic retinopathy; CSME clinically significant macular edema; DME diabetic macular edema; dbh dot blot hemorrhages; CWS cotton wool spot; POAG primary open angle glaucoma; C/D cup-to-disc ratio; HVF humphrey visual field; GVF goldmann visual field; OCT optical coherence tomography; IOP intraocular pressure; BRVO Branch retinal vein occlusion; CRVO central retinal vein occlusion; CRAO central retinal artery occlusion; BRAO branch retinal artery occlusion; RT retinal tear; SB scleral buckle; PPV pars plana vitrectomy; VH Vitreous hemorrhage; PRP panretinal laser photocoagulation; IVK intravitreal kenalog; VMT vitreomacular traction; MH Macular hole;  NVD neovascularization of the disc; NVE neovascularization elsewhere; AREDS age related eye disease study; ARMD age related macular degeneration; POAG primary open angle glaucoma; EBMD epithelial/anterior basement membrane dystrophy; ACIOL anterior chamber intraocular lens; IOL intraocular lens; PCIOL posterior chamber intraocular lens; Phaco/IOL phacoemulsification with intraocular lens placement; PRK photorefractive keratectomy; LASIK laser assisted in situ keratomileusis; HTN hypertension; DM diabetes mellitus; COPD chronic obstructive pulmonary disease

## 2022-07-09 ENCOUNTER — Ambulatory Visit (INDEPENDENT_AMBULATORY_CARE_PROVIDER_SITE_OTHER): Payer: 59 | Admitting: Ophthalmology

## 2022-07-09 ENCOUNTER — Encounter (INDEPENDENT_AMBULATORY_CARE_PROVIDER_SITE_OTHER): Payer: Self-pay | Admitting: Ophthalmology

## 2022-07-09 DIAGNOSIS — E113513 Type 2 diabetes mellitus with proliferative diabetic retinopathy with macular edema, bilateral: Secondary | ICD-10-CM

## 2022-07-09 DIAGNOSIS — Z7984 Long term (current) use of oral hypoglycemic drugs: Secondary | ICD-10-CM | POA: Diagnosis not present

## 2022-07-09 DIAGNOSIS — I10 Essential (primary) hypertension: Secondary | ICD-10-CM | POA: Diagnosis not present

## 2022-07-09 DIAGNOSIS — H35033 Hypertensive retinopathy, bilateral: Secondary | ICD-10-CM | POA: Diagnosis not present

## 2022-07-09 DIAGNOSIS — Z794 Long term (current) use of insulin: Secondary | ICD-10-CM | POA: Diagnosis not present

## 2022-07-09 DIAGNOSIS — E1169 Type 2 diabetes mellitus with other specified complication: Secondary | ICD-10-CM

## 2022-07-09 MED ORDER — BEVACIZUMAB CHEMO INJECTION 1.25MG/0.05ML SYRINGE FOR KALEIDOSCOPE
1.2500 mg | INTRAVITREAL | Status: AC | PRN
  Administered 2022-07-09: 1.25 mg via INTRAVITREAL

## 2022-07-09 MED ORDER — AFLIBERCEPT 2MG/0.05ML IZ SOLN FOR KALEIDOSCOPE
2.0000 mg | INTRAVITREAL | Status: AC | PRN
  Administered 2022-07-09: 2 mg via INTRAVITREAL

## 2022-07-29 ENCOUNTER — Ambulatory Visit
Admission: RE | Admit: 2022-07-29 | Discharge: 2022-07-29 | Disposition: A | Discharge status: Home / Self Care | Payer: 59 | Source: Ambulatory Visit | Attending: Interventional Radiology | Admitting: Interventional Radiology

## 2022-07-29 DIAGNOSIS — I6529 Occlusion and stenosis of unspecified carotid artery: Secondary | ICD-10-CM

## 2022-07-29 DIAGNOSIS — I6523 Occlusion and stenosis of bilateral carotid arteries: Secondary | ICD-10-CM | POA: Diagnosis not present

## 2022-07-29 DIAGNOSIS — I671 Cerebral aneurysm, nonruptured: Secondary | ICD-10-CM

## 2022-07-29 MED ORDER — IOPAMIDOL (ISOVUE-370) INJECTION 76%
75.0000 mL | Freq: Once | INTRAVENOUS | Status: AC | PRN
  Administered 2022-07-29: 75 mL via INTRAVENOUS

## 2022-08-03 DIAGNOSIS — I700 Atherosclerosis of aorta: Secondary | ICD-10-CM | POA: Insufficient documentation

## 2022-08-03 DIAGNOSIS — Z139 Encounter for screening, unspecified: Secondary | ICD-10-CM | POA: Diagnosis not present

## 2022-08-03 DIAGNOSIS — E119 Type 2 diabetes mellitus without complications: Secondary | ICD-10-CM | POA: Insufficient documentation

## 2022-08-03 DIAGNOSIS — Z0189 Encounter for other specified special examinations: Secondary | ICD-10-CM | POA: Diagnosis not present

## 2022-08-03 DIAGNOSIS — E11319 Type 2 diabetes mellitus with unspecified diabetic retinopathy without macular edema: Secondary | ICD-10-CM | POA: Diagnosis not present

## 2022-08-03 DIAGNOSIS — G40109 Localization-related (focal) (partial) symptomatic epilepsy and epileptic syndromes with simple partial seizures, not intractable, without status epilepticus: Secondary | ICD-10-CM | POA: Diagnosis not present

## 2022-08-03 DIAGNOSIS — E1143 Type 2 diabetes mellitus with diabetic autonomic (poly)neuropathy: Secondary | ICD-10-CM | POA: Diagnosis not present

## 2022-08-03 DIAGNOSIS — Z862 Personal history of diseases of the blood and blood-forming organs and certain disorders involving the immune mechanism: Secondary | ICD-10-CM | POA: Diagnosis not present

## 2022-08-03 DIAGNOSIS — I619 Nontraumatic intracerebral hemorrhage, unspecified: Secondary | ICD-10-CM | POA: Insufficient documentation

## 2022-08-03 DIAGNOSIS — E1121 Type 2 diabetes mellitus with diabetic nephropathy: Secondary | ICD-10-CM | POA: Diagnosis not present

## 2022-08-03 DIAGNOSIS — I729 Aneurysm of unspecified site: Secondary | ICD-10-CM | POA: Diagnosis not present

## 2022-08-03 DIAGNOSIS — E785 Hyperlipidemia, unspecified: Secondary | ICD-10-CM | POA: Diagnosis not present

## 2022-08-04 NOTE — Progress Notes (Signed)
[facility] - Clinic Note  08/10/2022     CHIEF COMPLAINT Patient presents for Retina Follow Up   HISTORY OF PRESENT ILLNESS: Jose Koch is a 52 y.o. male who presents to the clinic today for:   HPI     Retina Follow Up   Patient presents with  Diabetic Retinopathy.  In both eyes.  Severity is moderate.  Duration of 4 weeks.  Since onset it is stable.  I, the attending physician,  performed the HPI with the patient and updated documentation appropriately.        Comments   Pt here for 4 wk ret f/u PDR OU. Pt states VA is doing well. Pt was given amsler grid and notices distortion in central VA OD. He can tell he's getting better.       Last edited by Rennis Chris, MD on 08/10/2022 12:45 PM.    Pt states he had no problems after his last injection and vision in the left eye seems to be improved, he saw a new PCP that is going to do blood work regarding his cholesterol    Referring physician: Jarvis Morgan, DO [address] [place],  [place] [address]  HISTORICAL INFORMATION:   Selected notes from the MEDICAL RECORD NUMBER Referred by Dr. Illene Labrador LEE:  Ocular Hx- PMH-    CURRENT MEDICATIONS: No current outpatient medications on file. (Ophthalmic Drugs)   No current facility-administered medications for this visit. (Ophthalmic Drugs)   Current Outpatient Medications (Other)  Medication Sig   aspirin EC 81 MG tablet Take 81 mg by mouth daily. Swallow whole.   blood glucose meter kit and supplies KIT Dispense based on patient and insurance preference (something with affordable test strips). Use to check glucose twice daily as directed.   fluticasone (FLONASE) 50 MCG/ACT nasal spray Place 2 sprays into both nostrils daily.   folic acid (FOLVITE) 1 MG tablet Take 1 tablet (1 mg total) by mouth daily.   glucose blood (ACCU-CHEK GUIDE) test strip Use as instructed to monitor glucose twice daily, before breakfast and before bed   Insulin  Glargine (BASAGLAR KWIKPEN) 100 UNIT/ML Inject 30 Units into the skin at bedtime.   Insulin Pen Needle 31G X 5 MM MISC 1 Units by Does not apply route daily.   levETIRAcetam (KEPPRA) 500 MG tablet Take 1 tablet (500 mg total) by mouth 2 (two) times daily.   metFORMIN (GLUCOPHAGE XR) 500 MG 24 hr tablet Take 1 tablet (500 mg total) by mouth daily with breakfast.   metoCLOPramide (REGLAN) 10 MG tablet Take 10 mg by mouth 3 (three) times daily before meals.   Misc Natural Products (OSTEO BI-FLEX ADV DOUBLE ST PO) Take 1 tablet by mouth.   OneTouch Delica Lancets 33G MISC PLEASE SEE ATTACHED FOR DETAILED DIRECTIONS   pantoprazole (PROTONIX) 40 MG tablet TAKE 1 TABLET BY MOUTH 2 TIMES DAILY BEFORE A MEAL 30 MINUTES BEFORE BREAKFAST AND DINNER   ticagrelor (BRILINTA) 90 MG TABS tablet Take 1 tablet (90 mg total) by mouth 2 (two) times daily.   No current facility-administered medications for this visit. (Other)   REVIEW OF SYSTEMS: ROS   Positive for: Neurological, Endocrine, Eyes Negative for: Constitutional, Gastrointestinal, Skin, Genitourinary, Musculoskeletal, HENT, Cardiovascular, Respiratory, Psychiatric, Allergic/Imm, Heme/Lymph Last edited by Thompson Grayer, COT on 08/10/2022  8:41 AM.      ALLERGIES No Known Allergies  PAST MEDICAL HISTORY Past Medical History:  Diagnosis Date   COVID  COVID 02/2020   very sick - admitted to the hospital   Diabetes mellitus without complication (HCC)    GERD (gastroesophageal reflux disease)    Headache    High cholesterol    History of kidney stones    Hypertension    was diagnosed 2 years ago, never treated and states BP is always - documented 07/13/21   Ichthyosis vulgaris    Pneumonia    Sleep apnea    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   Past Surgical History:  Procedure Laterality Date   IR ANGIO INTRA EXTRACRAN SEL COM CAROTID INNOMINATE BILAT MOD SED  04/17/2021   IR ANGIO INTRA EXTRACRAN SEL INTERNAL CAROTID UNI R  MOD SED  07/17/2021   IR ANGIO VERTEBRAL SEL VERTEBRAL BILAT MOD SED  04/17/2021   IR CT HEAD LTD  07/15/2021   IR INTRA CRAN STENT  07/15/2021   IR RADIOLOGIST EVAL & MGMT  03/05/2021   IR RADIOLOGIST EVAL & MGMT  05/04/2021   IR RADIOLOGIST EVAL & MGMT  07/31/2021   IR US GUIDE VASC ACCESS RIGHT  04/17/2021   IR US GUIDE VASC ACCESS RIGHT  07/15/2021   RADIOLOGY WITH ANESTHESIA N/A 07/15/2021   Procedure: STENTING;  Surgeon: Julieanne Cotton, MD;  Location: [facility] OR;  Service: Radiology;  Laterality: N/A;   TONSILLECTOMY     wisdom teeth removal     FAMILY HISTORY Family History  Family history unknown: Yes   SOCIAL HISTORY Social History   Tobacco Use   Smoking status: Never   Smokeless tobacco: Never  Vaping Use   Vaping Use: Never used  Substance Use Topics   Alcohol use: Not Currently   Drug use: Never       OPHTHALMIC EXAM:  Base Eye Exam     Visual Acuity (Snellen - Linear)       Right Left   Dist cc 20/70 +1 20/250   Dist ph cc NI 20/200 -1    Correction: Glasses         Tonometry (Tonopen, 8:50 AM)       Right Left   Pressure 18 16         Pupils       Pupils Dark Light Shape React APD   Right PERRL 3 2 Round Brisk None   Left PERRL 3 2 Round Brisk None         Visual Fields (Counting fingers)       Left Right    Full Full         Extraocular Movement       Right Left    Full, Ortho Full, Ortho         Neuro/Psych     Oriented x3: Yes   Mood/Affect: Normal         Dilation     Both eyes: 1.0% Mydriacyl, 2.5% Phenylephrine @ 8:50 AM           Slit Lamp and Fundus Exam     Slit Lamp Exam       Right Left   Lids/Lashes Dermatochalasis - upper lid, Meibomian gland dysfunction, +Scurf Dermatochalasis - upper lid, Meibomian gland dysfunction, +Scurf   Conjunctiva/Sclera White and quiet White and quiet   Cornea trace PEE, mild tear film debris 1+PEE, mild tear film debris   Anterior Chamber deep and clear deep and clear    Iris Round and dilated, No NVI Round and dilated, No NVI   Lens  Clear Clear   Anterior Vitreous Vitreous syneresis mild syneresis         Fundus Exam       Right Left   Disc Pink and Sharp, +heme at 430 -- improved Pink, blurred margin, fine NVD, 360 exudates, scattered heme -- all improving   C/D Ratio 0.2 0.1   Macula Blunted foveal reflex, severe edema with severe exudation, scattered DBH Blunted foveal reflex, severe edema and exudation, scattered DBH -- all improved, lamellar mac hole   Vessels attenuated, Tortuous, +NVE greatest along inferior arcades -- improving, +Sheathing -- improving attenuated, Tortuous, +NV, +Sheathing -- improving   Periphery Attached, scattered MA/DBH, heavy exudates greatest posteriorly Attached, severe exudation posteriorly, scattered MA / DBH           Refraction     Wearing Rx       Sphere Cylinder Axis Add   Right -2.50 +1.00 049 +1.00   Left -0.75 +1.25 178 +1.00         Manifest Refraction       Sphere Cylinder Axis Dist VA   Right -2.00 +1.00 080 20/70+1   Left                IMAGING AND PROCEDURES  Imaging and Procedures for 08/10/2022  OCT, Retina - OU - Both Eyes       Right Eye Quality was good. Central Foveal Thickness: 303. Progression has been stable. Findings include no SRF, abnormal foveal contour, subretinal hyper-reflective material, intraretinal hyper-reflective material, intraretinal fluid, vitreomacular adhesion (persistent IRF, IRHM, SRHM -- minimal improvement).   Left Eye Quality was good. Central Foveal Thickness: 465. Progression has improved. Findings include no SRF, abnormal foveal contour, subretinal hyper-reflective material, intraretinal hyper-reflective material, epiretinal membrane, intraretinal fluid, lamellar hole, macular pucker (Mild interval improvement in diffuse edema and IRHM, +ERM with pucker).   Notes *Images captured and stored on drive  Diagnosis / Impression:  OD: persistent  IRF, IRHM, SRHM -- minimal improvement OS: Mild interval improvement in diffuse edema and IRHM; +ERM with pucker  Clinical management:  See below  Abbreviations: NFP - Normal foveal profile. CME - cystoid macular edema. PED - pigment epithelial detachment. IRF - intraretinal fluid. SRF - subretinal fluid. EZ - ellipsoid zone. ERM - epiretinal membrane. ORA - outer retinal atrophy. ORT - outer retinal tubulation. SRHM - subretinal hyper-reflective material. IRHM - intraretinal hyper-reflective material      Intravitreal Injection, Pharmacologic Agent - OD - Right Eye       Time Out 08/10/2022. 9:13 AM. Confirmed correct patient, procedure, site, and patient consented.   Anesthesia Topical anesthesia was used. Anesthetic medications included Lidocaine 2%, Proparacaine 0.5%.   Procedure Preparation included 5% betadine to ocular surface, eyelid speculum. A (32g) needle was used.   Injection: 2 mg aflibercept 2 MG/0.05ML   Route: Intravitreal, Site: Right Eye   NDC: L6038910, Lot: [id number], Expiration date: 04/22/2023, Waste: 0 mL   Post-op Post injection exam found visual acuity of at least counting fingers. The patient tolerated the procedure well. There were no complications. The patient received written and verbal post procedure care education.      Intravitreal Injection, Pharmacologic Agent - OS - Left Eye       Time Out 08/10/2022. 9:13 AM. Confirmed correct patient, procedure, site, and patient consented.   Anesthesia Topical anesthesia was used. Anesthetic medications included Lidocaine 2%, Proparacaine 0.5%.   Procedure Preparation included 5% betadine to ocular surface, eyelid speculum. A (32g) needle was used.  Injection: 2 mg aflibercept 2 MG/0.05ML   Route: Intravitreal, Site: Left Eye   NDC: L6038910, Lot: [id number], Expiration date: 07/23/2023, Waste: 0 mL   Post-op Post injection exam found visual acuity of at least counting fingers. The patient  tolerated the procedure well. There were no complications. The patient received written and verbal post procedure care education.            ASSESSMENT/PLAN:    ICD-10-CM   1. Proliferative diabetic retinopathy of both eyes with macular edema associated with type 2 diabetes mellitus (HCC)  E11.3513 OCT, Retina - OU - Both Eyes    Intravitreal Injection, Pharmacologic Agent - OD - Right Eye    Intravitreal Injection, Pharmacologic Agent - OS - Left Eye    aflibercept (EYLEA) SOLN 2 mg    aflibercept (EYLEA) SOLN 2 mg    2. Current use of insulin (HCC)  Z79.4     3. Long term (current) use of oral hypoglycemic drugs  Z79.84     4. Hyperlipidemia associated with type 2 diabetes mellitus (HCC)  E11.69    E78.5     5. Essential hypertension  I10     6. Hypertensive retinopathy of both eyes  H35.033      1-3. Proliferative diabetic retinopathy, both eyes  - A1c 7.4 on 08.11.23  - s/p IVA OS #1 (02.22.24), #2 (03.22.24) #3 (04.19.24)   -s/p IVA OD #1 (02.23.24), #2 (03.22.24) #3 (04.19.24), #4 (05.17.24)  - s/p IVE OS #1 (5.17.24) - exam shows severe edema with scattered MA and DBH, but significant lipid component to edema OU - FA (02.21.24) shows OD: Large cluster of leaking NV along IT arcades, scattered patches of vascular non-perfusion; OS: +NVD and NVE along proximal arcades - OCT shows OD: persistent IRF, IRHM, SRHM ; OS: Mild interval improvement in diffuse edema and IRHM; +ERM with pucker - good response to IVE OS - recommend IVE OU (OD #1 and OS #2) today, 06.18.24  - pt wishes to proceed - RBA of procedure discussed, questions answered - IVA informed consent obtained and signed, 02.24.24 (OU) - IVE informed consent obtained and signed, 05.17.24 (OU) - see procedure note - f/u 4 weeks -- DFE/OCT, possible injection OU  4. Hyperlipidemia  - review of labs shows severely elevated cholesterol and lipids  - 08.11.23:  Trig  785 Chol  598  LDL  410 - likely contributing  to severe edema and exudates  5,6. Hypertensive retinopathy OU - discussed importance of tight BP control - continue to monitor   Ophthalmic Meds Ordered this visit:  Meds ordered this encounter  Medications   aflibercept (EYLEA) SOLN 2 mg   aflibercept (EYLEA) SOLN 2 mg     Return in about 4 weeks (around 09/07/2022) for f/u PDR OU, DFE, OCT, Possible Injxn.  There are no Patient Instructions on file for this visit.   This document serves as a record of services personally performed by Karie Chimera, MD, PhD. It was created on their behalf by Gerilyn Nestle, COT an ophthalmic technician. The creation of this record is the provider's dictation and/or activities during the visit.    Electronically signed by:  Charlette Caffey, COT  6.12.24 8:04 PM  This document serves as a record of services personally performed by Karie Chimera, MD, PhD. It was created on their behalf by Glee Arvin. Manson Passey, OA an ophthalmic technician. The creation of this record is the provider's dictation and/or activities during the visit.  Electronically signed by: Glee Arvin. Manson Passey, [place] 06.18.2024 8:04 PM  Karie Chimera, M.D., Ph.D. Diseases & Surgery of the Retina and Vitreous [facility]  I have reviewed the above documentation for accuracy and completeness, and I agree with the above. Karie Chimera, M.D., Ph.D. 08/10/22 8:13 PM  Abbreviations: M myopia (nearsighted); A astigmatism; H hyperopia (farsighted); P presbyopia; Mrx spectacle prescription;  CTL contact lenses; OD right eye; OS left eye; OU both eyes  XT exotropia; ET esotropia; PEK punctate epithelial keratitis; PEE punctate epithelial erosions; DES dry eye syndrome; MGD meibomian gland dysfunction; ATs artificial tears; PFAT's preservative free artificial tears; NSC nuclear sclerotic cataract; PSC posterior subcapsular cataract; ERM epi-retinal membrane; PVD posterior vitreous detachment; RD retinal detachment; DM  diabetes mellitus; DR diabetic retinopathy; NPDR non-proliferative diabetic retinopathy; PDR proliferative diabetic retinopathy; CSME clinically significant macular edema; DME diabetic macular edema; dbh dot blot hemorrhages; CWS cotton wool spot; POAG primary open angle glaucoma; C/D cup-to-disc ratio; HVF humphrey visual field; GVF goldmann visual field; OCT optical coherence tomography; IOP intraocular pressure; BRVO Branch retinal vein occlusion; CRVO central retinal vein occlusion; CRAO central retinal artery occlusion; BRAO branch retinal artery occlusion; RT retinal tear; SB scleral buckle; PPV pars plana vitrectomy; VH Vitreous hemorrhage; PRP panretinal laser photocoagulation; IVK intravitreal kenalog; VMT vitreomacular traction; MH Macular hole;  NVD neovascularization of the disc; NVE neovascularization elsewhere; AREDS age related eye disease study; ARMD age related macular degeneration; POAG primary open angle glaucoma; EBMD epithelial/anterior basement membrane dystrophy; ACIOL anterior chamber intraocular lens; IOL intraocular lens; PCIOL posterior chamber intraocular lens; Phaco/IOL phacoemulsification with intraocular lens placement; PRK photorefractive keratectomy; LASIK laser assisted in situ keratomileusis; HTN hypertension; DM diabetes mellitus; COPD chronic obstructive pulmonary disease

## 2022-08-10 ENCOUNTER — Ambulatory Visit (INDEPENDENT_AMBULATORY_CARE_PROVIDER_SITE_OTHER): Payer: 59 | Admitting: Ophthalmology

## 2022-08-10 ENCOUNTER — Encounter (INDEPENDENT_AMBULATORY_CARE_PROVIDER_SITE_OTHER): Payer: Self-pay | Admitting: Ophthalmology

## 2022-08-10 DIAGNOSIS — E1169 Type 2 diabetes mellitus with other specified complication: Secondary | ICD-10-CM

## 2022-08-10 DIAGNOSIS — H35033 Hypertensive retinopathy, bilateral: Secondary | ICD-10-CM | POA: Diagnosis not present

## 2022-08-10 DIAGNOSIS — E113513 Type 2 diabetes mellitus with proliferative diabetic retinopathy with macular edema, bilateral: Secondary | ICD-10-CM | POA: Diagnosis not present

## 2022-08-10 DIAGNOSIS — Z794 Long term (current) use of insulin: Secondary | ICD-10-CM | POA: Diagnosis not present

## 2022-08-10 DIAGNOSIS — Z7984 Long term (current) use of oral hypoglycemic drugs: Secondary | ICD-10-CM | POA: Diagnosis not present

## 2022-08-10 DIAGNOSIS — I10 Essential (primary) hypertension: Secondary | ICD-10-CM

## 2022-08-10 MED ORDER — AFLIBERCEPT 2MG/0.05ML IZ SOLN FOR KALEIDOSCOPE
2.0000 mg | INTRAVITREAL | Status: AC | PRN
  Administered 2022-08-10: 2 mg via INTRAVITREAL

## 2022-08-18 DIAGNOSIS — Z139 Encounter for screening, unspecified: Secondary | ICD-10-CM | POA: Diagnosis not present

## 2022-08-23 ENCOUNTER — Telehealth (HOSPITAL_COMMUNITY): Payer: Self-pay

## 2022-08-23 DIAGNOSIS — R809 Proteinuria, unspecified: Secondary | ICD-10-CM | POA: Insufficient documentation

## 2022-08-23 NOTE — Telephone Encounter (Signed)
Called regarding recent imaging, no answer, left vm. AB  

## 2022-08-24 ENCOUNTER — Other Ambulatory Visit (HOSPITAL_COMMUNITY): Payer: Self-pay | Admitting: Family Medicine

## 2022-08-24 DIAGNOSIS — E785 Hyperlipidemia, unspecified: Secondary | ICD-10-CM

## 2022-08-24 DIAGNOSIS — H6122 Impacted cerumen, left ear: Secondary | ICD-10-CM | POA: Insufficient documentation

## 2022-08-24 DIAGNOSIS — M1711 Unilateral primary osteoarthritis, right knee: Secondary | ICD-10-CM | POA: Diagnosis not present

## 2022-08-24 DIAGNOSIS — Z0001 Encounter for general adult medical examination with abnormal findings: Secondary | ICD-10-CM | POA: Diagnosis not present

## 2022-08-24 DIAGNOSIS — G40109 Localization-related (focal) (partial) symptomatic epilepsy and epileptic syndromes with simple partial seizures, not intractable, without status epilepticus: Secondary | ICD-10-CM | POA: Diagnosis not present

## 2022-08-24 DIAGNOSIS — I700 Atherosclerosis of aorta: Secondary | ICD-10-CM | POA: Diagnosis not present

## 2022-08-24 DIAGNOSIS — E1121 Type 2 diabetes mellitus with diabetic nephropathy: Secondary | ICD-10-CM | POA: Diagnosis not present

## 2022-08-24 DIAGNOSIS — Z1331 Encounter for screening for depression: Secondary | ICD-10-CM | POA: Diagnosis not present

## 2022-08-24 DIAGNOSIS — D649 Anemia, unspecified: Secondary | ICD-10-CM | POA: Insufficient documentation

## 2022-08-24 DIAGNOSIS — E11319 Type 2 diabetes mellitus with unspecified diabetic retinopathy without macular edema: Secondary | ICD-10-CM | POA: Diagnosis not present

## 2022-08-24 DIAGNOSIS — L853 Xerosis cutis: Secondary | ICD-10-CM | POA: Insufficient documentation

## 2022-08-24 DIAGNOSIS — E1143 Type 2 diabetes mellitus with diabetic autonomic (poly)neuropathy: Secondary | ICD-10-CM | POA: Diagnosis not present

## 2022-08-24 DIAGNOSIS — I729 Aneurysm of unspecified site: Secondary | ICD-10-CM | POA: Diagnosis not present

## 2022-08-25 NOTE — Progress Notes (Signed)
[facility] - Clinic Note  09/07/2022     CHIEF COMPLAINT Patient presents for Retina Follow Up   HISTORY OF PRESENT ILLNESS: Jose Koch is a 52 y.o. male who presents to the clinic today for:   HPI     Retina Follow Up   Patient presents with  Diabetic Retinopathy.  In both eyes.  This started years ago.  Severity is moderate.  Duration of 4 weeks.  Since onset it is stable.  I, the attending physician,  performed the HPI with the patient and updated documentation appropriately.        Comments   Patient feels that the vision has gotten a little clearer. He is not using eye drops. His blood sugar was 193.      Last edited by Rennis Chris, MD on 09/07/2022  1:21 PM.    Pt started new medications to get his cholesterol down about 1.5 weeks ago    Referring physician: Benita Stabile, MD [address] [place],  [place] [address]  HISTORICAL INFORMATION:   Selected notes from the MEDICAL RECORD NUMBER Referred by Dr. Illene Labrador LEE:  Ocular Hx- PMH-    CURRENT MEDICATIONS: No current outpatient medications on file. (Ophthalmic Drugs)   No current facility-administered medications for this visit. (Ophthalmic Drugs)   Current Outpatient Medications (Other)  Medication Sig   aspirin EC 81 MG tablet Take 81 mg by mouth daily. Swallow whole.   atorvastatin (LIPITOR) 40 MG tablet Take 40 mg by mouth daily.   blood glucose meter kit and supplies KIT Dispense based on patient and insurance preference (something with affordable test strips). Use to check glucose twice daily as directed.   fenofibrate 160 MG tablet Take 160 mg by mouth daily.   fluticasone (FLONASE) 50 MCG/ACT nasal spray Place 2 sprays into both nostrils daily.   folic acid (FOLVITE) 1 MG tablet Take 1 tablet (1 mg total) by mouth daily.   glucose blood (ACCU-CHEK GUIDE) test strip Use as instructed to monitor glucose twice daily, before breakfast and before bed   Insulin Glargine  (BASAGLAR KWIKPEN) 100 UNIT/ML Inject 30 Units into the skin at bedtime.   Insulin Pen Needle 31G X 5 MM MISC 1 Units by Does not apply route daily.   JANUVIA 100 MG tablet Take 100 mg by mouth daily.   JARDIANCE 25 MG TABS tablet Take 25 mg by mouth daily.   levETIRAcetam (KEPPRA) 500 MG tablet Take 1 tablet (500 mg total) by mouth 2 (two) times daily.   metFORMIN (GLUCOPHAGE XR) 500 MG 24 hr tablet Take 1 tablet (500 mg total) by mouth daily with breakfast.   metFORMIN (GLUCOPHAGE) 1000 MG tablet Take 1,000 mg by mouth daily.   metoCLOPramide (REGLAN) 10 MG tablet Take 10 mg by mouth 3 (three) times daily before meals.   Misc Natural Products (OSTEO BI-FLEX ADV DOUBLE ST PO) Take 1 tablet by mouth.   OneTouch Delica Lancets 33G MISC PLEASE SEE ATTACHED FOR DETAILED DIRECTIONS   pantoprazole (PROTONIX) 40 MG tablet TAKE 1 TABLET BY MOUTH 2 TIMES DAILY BEFORE A MEAL 30 MINUTES BEFORE BREAKFAST AND DINNER   ticagrelor (BRILINTA) 90 MG TABS tablet Take 1 tablet (90 mg total) by mouth 2 (two) times daily.   No current facility-administered medications for this visit. (Other)   REVIEW OF SYSTEMS: ROS   Positive for: Neurological, Endocrine, Eyes Negative for: Constitutional, Gastrointestinal, Skin, Genitourinary, Musculoskeletal, HENT, Cardiovascular, Respiratory, Psychiatric, Allergic/Imm, Heme/Lymph Last  edited by Charlette Caffey, COT on 09/07/2022  8:36 AM.       ALLERGIES No Known Allergies  PAST MEDICAL HISTORY Past Medical History:  Diagnosis Date   COVID    COVID 02/2020   very sick - admitted to the hospital   Diabetes mellitus without complication (HCC)    GERD (gastroesophageal reflux disease)    Headache    High cholesterol    History of kidney stones    Hypertension    was diagnosed 2 years ago, never treated and states BP is always - documented 07/13/21   Ichthyosis vulgaris    Pneumonia    Sleep apnea    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   Past  Surgical History:  Procedure Laterality Date   IR ANGIO INTRA EXTRACRAN SEL COM CAROTID INNOMINATE BILAT MOD SED  04/17/2021   IR ANGIO INTRA EXTRACRAN SEL INTERNAL CAROTID UNI R MOD SED  07/17/2021   IR ANGIO VERTEBRAL SEL VERTEBRAL BILAT MOD SED  04/17/2021   IR CT HEAD LTD  07/15/2021   IR INTRA CRAN STENT  07/15/2021   IR RADIOLOGIST EVAL & MGMT  03/05/2021   IR RADIOLOGIST EVAL & MGMT  05/04/2021   IR RADIOLOGIST EVAL & MGMT  07/31/2021   IR US GUIDE VASC ACCESS RIGHT  04/17/2021   IR US GUIDE VASC ACCESS RIGHT  07/15/2021   RADIOLOGY WITH ANESTHESIA N/A 07/15/2021   Procedure: STENTING;  Surgeon: Julieanne Cotton, MD;  Location: [facility] OR;  Service: Radiology;  Laterality: N/A;   TONSILLECTOMY     wisdom teeth removal     FAMILY HISTORY Family History  Family history unknown: Yes   SOCIAL HISTORY Social History   Tobacco Use   Smoking status: Never   Smokeless tobacco: Never  Vaping Use   Vaping status: Never Used  Substance Use Topics   Alcohol use: Not Currently   Drug use: Never       OPHTHALMIC EXAM:  Base Eye Exam     Visual Acuity (Snellen - Linear)       Right Left   Dist cc 20/60 20/200   Dist ph cc NI NI         Tonometry (Tonopen, 8:42 AM)       Right Left   Pressure 11 13         Pupils       Dark Light Shape React APD   Right 3 2 Round Brisk None   Left 3 2 Round Brisk None         Visual Fields       Left Right    Full Full         Extraocular Movement       Right Left    Full, Ortho Full, Ortho         Neuro/Psych     Oriented x3: Yes   Mood/Affect: Normal         Dilation     Both eyes: 1.0% Mydriacyl, 2.5% Phenylephrine @ 8:38 AM           Slit Lamp and Fundus Exam     Slit Lamp Exam       Right Left   Lids/Lashes Dermatochalasis - upper lid, Meibomian gland dysfunction, +Scurf Dermatochalasis - upper lid, Meibomian gland dysfunction, +Scurf   Conjunctiva/Sclera White and quiet White and quiet   Cornea  trace PEE, mild tear film debris 1+PEE, mild tear film debris  Anterior Chamber deep and clear deep and clear   Iris Round and dilated, No NVI Round and dilated, No NVI   Lens Clear Clear   Anterior Vitreous Vitreous syneresis mild syneresis         Fundus Exam       Right Left   Disc Pink and Sharp, +heme at 430 -- resolved Pink, blurred margin, fine NVD, 360 exudates, scattered heme -- all improving   C/D Ratio 0.2 0.1   Macula Blunted foveal reflex, severe edema with severe exudation -- slightly improved, scattered DBH Blunted foveal reflex, severe edema and exudation, scattered DBH -- all improved, lamellar mac hole   Vessels attenuated, Tortuous, +NVE greatest along inferior arcades -- improving, +Sheathing -- improving attenuated, Tortuous, +NV, +Sheathing -- improving   Periphery Attached, scattered MA/DBH, heavy exudates greatest posteriorly Attached, severe exudation posteriorly, scattered MA / DBH           Refraction     Wearing Rx       Sphere Cylinder Axis Add   Right -2.50 +1.00 049 +1.00   Left -0.75 +1.25 178 +1.00            IMAGING AND PROCEDURES  Imaging and Procedures for 09/07/2022  OCT, Retina - OU - Both Eyes       Right Eye Quality was borderline. Central Foveal Thickness: 293. Progression has improved. Findings include no SRF, abnormal foveal contour, subretinal hyper-reflective material, intraretinal hyper-reflective material, intraretinal fluid, vitreomacular adhesion (persistent IRF, IRHM, SRHM -- slightly improved).   Left Eye Quality was good. Central Foveal Thickness: 444. Progression has improved. Findings include no SRF, abnormal foveal contour, subretinal hyper-reflective material, intraretinal hyper-reflective material, epiretinal membrane, intraretinal fluid, lamellar hole, macular pucker (Mild interval improvement in diffuse edema and IRHM, +ERM with pucker).   Notes *Images captured and stored on drive  Diagnosis / Impression:   OD: persistent IRF, IRHM, SRHM -- slightly improved OS: Mild interval improvement in diffuse edema and IRHM; +ERM with pucker  Clinical management:  See below  Abbreviations: NFP - Normal foveal profile. CME - cystoid macular edema. PED - pigment epithelial detachment. IRF - intraretinal fluid. SRF - subretinal fluid. EZ - ellipsoid zone. ERM - epiretinal membrane. ORA - outer retinal atrophy. ORT - outer retinal tubulation. SRHM - subretinal hyper-reflective material. IRHM - intraretinal hyper-reflective material      Intravitreal Injection, Pharmacologic Agent - OD - Right Eye       Time Out 09/07/2022. 9:19 AM. Confirmed correct patient, procedure, site, and patient consented.   Anesthesia Topical anesthesia was used. Anesthetic medications included Lidocaine 2%, Proparacaine 0.5%.   Procedure Preparation included 5% betadine to ocular surface, eyelid speculum. A (32g) needle was used.   Injection: 2 mg aflibercept 2 MG/0.05ML   Route: Intravitreal, Site: Right Eye   NDC: L6038910, Lot: [id number], Expiration date: 08/22/2023, Waste: 0 mL   Post-op Post injection exam found visual acuity of at least counting fingers. The patient tolerated the procedure well. There were no complications. The patient received written and verbal post procedure care education.      Intravitreal Injection, Pharmacologic Agent - OS - Left Eye       Time Out 09/07/2022. 9:19 AM. Confirmed correct patient, procedure, site, and patient consented.   Anesthesia Topical anesthesia was used. Anesthetic medications included Lidocaine 2%, Proparacaine 0.5%.   Procedure Preparation included 5% betadine to ocular surface, eyelid speculum. A (32g) needle was used.   Injection: 2 mg aflibercept 2 MG/0.05ML  Route: Intravitreal, Site: Left Eye   NDC: L6038910, Lot: [id number], Expiration date: 08/22/2023, Waste: 0 mL   Post-op Post injection exam found visual acuity of at least counting  fingers. The patient tolerated the procedure well. There were no complications. The patient received written and verbal post procedure care education.            ASSESSMENT/PLAN:    ICD-10-CM   1. Proliferative diabetic retinopathy of both eyes with macular edema associated with type 2 diabetes mellitus (HCC)  E11.3513 OCT, Retina - OU - Both Eyes    Intravitreal Injection, Pharmacologic Agent - OD - Right Eye    Intravitreal Injection, Pharmacologic Agent - OS - Left Eye    aflibercept (EYLEA) SOLN 2 mg    aflibercept (EYLEA) SOLN 2 mg    2. Current use of insulin (HCC)  Z79.4     3. Long term (current) use of oral hypoglycemic drugs  Z79.84     4. Hyperlipidemia associated with type 2 diabetes mellitus (HCC)  E11.69    E78.5     5. Essential hypertension  I10     6. Hypertensive retinopathy of both eyes  H35.033      1-3. Proliferative diabetic retinopathy, both eyes  - A1c 7.4 on 08.11.23  - s/p IVA OS #1 (02.22.24), #2 (03.22.24) #3 (04.19.24)   -s/p IVA OD #1 (02.23.24), #2 (03.22.24) #3 (04.19.24), #4 (05.17.24), #5 (06.18.24)  - s/p IVE OD #1 (06.18.24)  - s/p IVE OS #1 (05.17.24), #2 (06.18.24) - exam shows severe edema with scattered MA and DBH, but significant lipid component to edema OU - FA (02.21.24) shows OD: Large cluster of leaking NV along IT arcades, scattered patches of vascular non-perfusion; OS: +NVD and NVE along proximal arcades - BCVA OD 20/60 from 20/70; OS stable at 20/200 - OCT shows OD: persistent IRF, IRHM, SRHM -- slightly improved; OS: Mild interval improvement in diffuse edema and IRHM; +ERM with pucker - good response to IVE - recommend IVE OU (OD #2 and OS #3) today, 07.16.24  - pt wishes to proceed - RBA of procedure discussed, questions answered - IVE informed consent obtained and signed, 05.17.24 (OU) - see procedure note - f/u 4 weeks -- DFE/OCT, possible injection OU  4. Hyperlipidemia  - review of labs shows severely elevated  cholesterol and lipids  - 08.11.23:  Trig  785 Chol  598  LDL  410 - likely contributing to severe edema and exudates  5,6. Hypertensive retinopathy OU - discussed importance of tight BP control - continue to monitor   Ophthalmic Meds Ordered this visit:  Meds ordered this encounter  Medications   aflibercept (EYLEA) SOLN 2 mg   aflibercept (EYLEA) SOLN 2 mg     Return in about 4 weeks (around 10/05/2022) for f/u PDR OU, DFE, OCT.  There are no Patient Instructions on file for this visit.  This document serves as a record of services personally performed by Karie Chimera, MD, PhD. It was created on their behalf by Gerilyn Nestle, COT an ophthalmic technician. The creation of this record is the provider's dictation and/or activities during the visit.    Electronically signed by:  Charlette Caffey, COT  09/09/22 7:59 PM  This document serves as a record of services personally performed by Karie Chimera, MD, PhD. It was created on their behalf by Glee Arvin. Manson Passey, OA an ophthalmic technician. The creation of this record is the provider's dictation and/or activities during the visit.  Electronically signed by: Glee Arvin. Manson Passey, OA 09/09/22 7:59 PM  Karie Chimera, M.D., Ph.D. Diseases & Surgery of the Retina and Vitreous [facility]  I have reviewed the above documentation for accuracy and completeness, and I agree with the above. Karie Chimera, M.D., Ph.D. 09/09/22 8:02 PM   Abbreviations: M myopia (nearsighted); A astigmatism; H hyperopia (farsighted); P presbyopia; Mrx spectacle prescription;  CTL contact lenses; OD right eye; OS left eye; OU both eyes  XT exotropia; ET esotropia; PEK punctate epithelial keratitis; PEE punctate epithelial erosions; DES dry eye syndrome; MGD meibomian gland dysfunction; ATs artificial tears; PFAT's preservative free artificial tears; NSC nuclear sclerotic cataract; PSC posterior subcapsular cataract; ERM  epi-retinal membrane; PVD posterior vitreous detachment; RD retinal detachment; DM diabetes mellitus; DR diabetic retinopathy; NPDR non-proliferative diabetic retinopathy; PDR proliferative diabetic retinopathy; CSME clinically significant macular edema; DME diabetic macular edema; dbh dot blot hemorrhages; CWS cotton wool spot; POAG primary open angle glaucoma; C/D cup-to-disc ratio; HVF humphrey visual field; GVF goldmann visual field; OCT optical coherence tomography; IOP intraocular pressure; BRVO Branch retinal vein occlusion; CRVO central retinal vein occlusion; CRAO central retinal artery occlusion; BRAO branch retinal artery occlusion; RT retinal tear; SB scleral buckle; PPV pars plana vitrectomy; VH Vitreous hemorrhage; PRP panretinal laser photocoagulation; IVK intravitreal kenalog; VMT vitreomacular traction; MH Macular hole;  NVD neovascularization of the disc; NVE neovascularization elsewhere; AREDS age related eye disease study; ARMD age related macular degeneration; POAG primary open angle glaucoma; EBMD epithelial/anterior basement membrane dystrophy; ACIOL anterior chamber intraocular lens; IOL intraocular lens; PCIOL posterior chamber intraocular lens; Phaco/IOL phacoemulsification with intraocular lens placement; PRK photorefractive keratectomy; LASIK laser assisted in situ keratomileusis; HTN hypertension; DM diabetes mellitus; COPD chronic obstructive pulmonary disease

## 2022-09-07 ENCOUNTER — Encounter (INDEPENDENT_AMBULATORY_CARE_PROVIDER_SITE_OTHER): Payer: Self-pay | Admitting: Ophthalmology

## 2022-09-07 ENCOUNTER — Ambulatory Visit (INDEPENDENT_AMBULATORY_CARE_PROVIDER_SITE_OTHER): Payer: 59 | Admitting: Ophthalmology

## 2022-09-07 DIAGNOSIS — Z794 Long term (current) use of insulin: Secondary | ICD-10-CM

## 2022-09-07 DIAGNOSIS — Z7984 Long term (current) use of oral hypoglycemic drugs: Secondary | ICD-10-CM

## 2022-09-07 DIAGNOSIS — H35033 Hypertensive retinopathy, bilateral: Secondary | ICD-10-CM | POA: Diagnosis not present

## 2022-09-07 DIAGNOSIS — I10 Essential (primary) hypertension: Secondary | ICD-10-CM | POA: Diagnosis not present

## 2022-09-07 DIAGNOSIS — E1169 Type 2 diabetes mellitus with other specified complication: Secondary | ICD-10-CM

## 2022-09-07 DIAGNOSIS — E113513 Type 2 diabetes mellitus with proliferative diabetic retinopathy with macular edema, bilateral: Secondary | ICD-10-CM

## 2022-09-07 MED ORDER — AFLIBERCEPT 2MG/0.05ML IZ SOLN FOR KALEIDOSCOPE
2.0000 mg | INTRAVITREAL | Status: AC | PRN
Start: 2022-09-07 — End: 2022-09-07
  Administered 2022-09-07: 2 mg via INTRAVITREAL

## 2022-09-21 ENCOUNTER — Other Ambulatory Visit: Payer: Self-pay | Admitting: *Deleted

## 2022-09-21 DIAGNOSIS — I739 Peripheral vascular disease, unspecified: Secondary | ICD-10-CM

## 2022-09-21 DIAGNOSIS — M79606 Pain in leg, unspecified: Secondary | ICD-10-CM

## 2022-09-21 NOTE — Progress Notes (Signed)
[facility] - Clinic Note  10/05/2022     CHIEF COMPLAINT Patient presents for Retina Follow Up   HISTORY OF PRESENT ILLNESS: Jose Koch is a 52 y.o. male who presents to the clinic today for:   HPI     Retina Follow Up   Patient presents with  Diabetic Retinopathy.  In both eyes.  This started 4 weeks ago.  I, the attending physician,  performed the HPI with the patient and updated documentation appropriately.        Comments   Patient here for 4 weeks retina follow up for PDR OU. Patient states vision doing ok. No eye pain.       Last edited by Rennis Chris, MD on 10/05/2022 12:32 PM.    Pt states vision is a little better, he started taking Januvia and Jardiance recently, metformin dosage was increased, but has not been back to see his dr   Referring physician: Benita Stabile, MD [address] [place],  [place] [address]  HISTORICAL INFORMATION:   Selected notes from the MEDICAL RECORD NUMBER Referred by Dr. Illene Labrador LEE:  Ocular Hx- PMH-    CURRENT MEDICATIONS: No current outpatient medications on file. (Ophthalmic Drugs)   No current facility-administered medications for this visit. (Ophthalmic Drugs)   Current Outpatient Medications (Other)  Medication Sig   aspirin EC 81 MG tablet Take 81 mg by mouth daily. Swallow whole.   atorvastatin (LIPITOR) 40 MG tablet Take 40 mg by mouth daily.   blood glucose meter kit and supplies KIT Dispense based on patient and insurance preference (something with affordable test strips). Use to check glucose twice daily as directed.   fenofibrate 160 MG tablet Take 160 mg by mouth daily.   fluticasone (FLONASE) 50 MCG/ACT nasal spray Place 2 sprays into both nostrils daily.   folic acid (FOLVITE) 1 MG tablet Take 1 tablet (1 mg total) by mouth daily.   glucose blood (ACCU-CHEK GUIDE) test strip Use as instructed to monitor glucose twice daily, before breakfast and before bed   Insulin Glargine  (BASAGLAR KWIKPEN) 100 UNIT/ML Inject 30 Units into the skin at bedtime.   Insulin Pen Needle 31G X 5 MM MISC 1 Units by Does not apply route daily.   JANUVIA 100 MG tablet Take 100 mg by mouth daily.   JARDIANCE 25 MG TABS tablet Take 25 mg by mouth daily.   levETIRAcetam (KEPPRA) 500 MG tablet Take 1 tablet (500 mg total) by mouth 2 (two) times daily.   metFORMIN (GLUCOPHAGE XR) 500 MG 24 hr tablet Take 1 tablet (500 mg total) by mouth daily with breakfast.   metFORMIN (GLUCOPHAGE) 1000 MG tablet Take 1,000 mg by mouth daily.   metoCLOPramide (REGLAN) 10 MG tablet Take 10 mg by mouth 3 (three) times daily before meals.   Misc Natural Products (OSTEO BI-FLEX ADV DOUBLE ST PO) Take 1 tablet by mouth.   OneTouch Delica Lancets 33G MISC PLEASE SEE ATTACHED FOR DETAILED DIRECTIONS   pantoprazole (PROTONIX) 40 MG tablet TAKE 1 TABLET BY MOUTH 2 TIMES DAILY BEFORE A MEAL 30 MINUTES BEFORE BREAKFAST AND DINNER   ticagrelor (BRILINTA) 90 MG TABS tablet Take 1 tablet (90 mg total) by mouth 2 (two) times daily.   No current facility-administered medications for this visit. (Other)   REVIEW OF SYSTEMS: ROS   Positive for: Neurological, Endocrine, Eyes Negative for: Constitutional, Gastrointestinal, Skin, Genitourinary, Musculoskeletal, HENT, Cardiovascular, Respiratory, Psychiatric, Allergic/Imm, Heme/Lymph Last edited by Kemper Durie,  Octaviano Batty, COA on 10/05/2022  8:57 AM.     ALLERGIES No Known Allergies  PAST MEDICAL HISTORY Past Medical History:  Diagnosis Date   COVID    COVID 02/2020   very sick - admitted to the hospital   Diabetes mellitus without complication (HCC)    GERD (gastroesophageal reflux disease)    Headache    High cholesterol    History of kidney stones    Hypertension    was diagnosed 2 years ago, never treated and states BP is always - documented 07/13/21   Ichthyosis vulgaris    Pneumonia    Sleep apnea    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   Past Surgical  History:  Procedure Laterality Date   IR ANGIO INTRA EXTRACRAN SEL COM CAROTID INNOMINATE BILAT MOD SED  04/17/2021   IR ANGIO INTRA EXTRACRAN SEL INTERNAL CAROTID UNI R MOD SED  07/17/2021   IR ANGIO VERTEBRAL SEL VERTEBRAL BILAT MOD SED  04/17/2021   IR CT HEAD LTD  07/15/2021   IR INTRA CRAN STENT  07/15/2021   IR RADIOLOGIST EVAL & MGMT  03/05/2021   IR RADIOLOGIST EVAL & MGMT  05/04/2021   IR RADIOLOGIST EVAL & MGMT  07/31/2021   IR US GUIDE VASC ACCESS RIGHT  04/17/2021   IR US GUIDE VASC ACCESS RIGHT  07/15/2021   RADIOLOGY WITH ANESTHESIA N/A 07/15/2021   Procedure: STENTING;  Surgeon: Julieanne Cotton, MD;  Location: [facility] OR;  Service: Radiology;  Laterality: N/A;   TONSILLECTOMY     wisdom teeth removal     FAMILY HISTORY Family History  Family history unknown: Yes   SOCIAL HISTORY Social History   Tobacco Use   Smoking status: Never   Smokeless tobacco: Never  Vaping Use   Vaping status: Never Used  Substance Use Topics   Alcohol use: Not Currently   Drug use: Never       OPHTHALMIC EXAM:  Base Eye Exam     Visual Acuity (Snellen - Linear)       Right Left   Dist cc 20/60 -1 20/200 -2   Dist ph cc NI 20/150 -2    Correction: Glasses         Tonometry (Tonopen, 8:55 AM)       Right Left   Pressure 17 07         Pupils       Dark Light Shape React APD   Right 3 2 Round Brisk None   Left 3 2 Round Brisk None         Visual Fields (Counting fingers)       Left Right    Full Full         Extraocular Movement       Right Left    Full, Ortho Full, Ortho         Neuro/Psych     Oriented x3: Yes   Mood/Affect: Normal         Dilation     Both eyes: 1.0% Mydriacyl, 2.5% Phenylephrine @ 8:55 AM           Slit Lamp and Fundus Exam     Slit Lamp Exam       Right Left   Lids/Lashes Dermatochalasis - upper lid, Meibomian gland dysfunction, +Scurf Dermatochalasis - upper lid, Meibomian gland dysfunction, +Scurf    Conjunctiva/Sclera White and quiet White and quiet   Cornea trace PEE, mild tear film debris 1+PEE,  mild tear film debris   Anterior Chamber deep and clear deep and clear   Iris Round and dilated, No NVI Round and dilated, No NVI   Lens Clear Clear   Anterior Vitreous Vitreous syneresis mild syneresis         Fundus Exam       Right Left   Disc Pink and Sharp, no heme Pink, blurred margin, fine NVD -- regressed, 360 exudates   C/D Ratio 0.2 0.1   Macula Blunted foveal reflex, severe edema with severe exudation -- slightly improved, scattered DBH Blunted foveal reflex, severe edema and exudation, scattered DBH -- all improved   Vessels attenuated, Tortuous, +NVE greatest along inferior arcades -- improving, +Sheathing -- improving attenuated, Tortuous, +NV, +Sheathing -- improving   Periphery Attached, scattered MA/DBH, heavy exudates greatest posteriorly Attached, severe exudation posteriorly, scattered MA / DBH           Refraction     Wearing Rx       Sphere Cylinder Axis Add   Right -2.50 +1.00 049 +1.00   Left -0.75 +1.25 178 +1.00            IMAGING AND PROCEDURES  Imaging and Procedures for 10/05/2022  OCT, Retina - OU - Both Eyes       Right Eye Quality was good. Central Foveal Thickness: 334. Progression has been stable. Findings include no SRF, abnormal foveal contour, subretinal hyper-reflective material, intraretinal hyper-reflective material, intraretinal fluid, vitreomacular adhesion (persistent IRF, IRHM, SRHM ).   Left Eye Quality was good. Central Foveal Thickness: 349. Progression has improved. Findings include no SRF, abnormal foveal contour, subretinal hyper-reflective material, intraretinal hyper-reflective material, epiretinal membrane, intraretinal fluid, lamellar hole, macular pucker (Mild interval improvement in diffuse edema and IRHM, +ERM with pucker).   Notes *Images captured and stored on drive  Diagnosis / Impression:  OD: persistent  IRF, IRHM, SRHM OS: Mild interval improvement in diffuse edema and IRHM; +ERM with pucker  Clinical management:  See below  Abbreviations: NFP - Normal foveal profile. CME - cystoid macular edema. PED - pigment epithelial detachment. IRF - intraretinal fluid. SRF - subretinal fluid. EZ - ellipsoid zone. ERM - epiretinal membrane. ORA - outer retinal atrophy. ORT - outer retinal tubulation. SRHM - subretinal hyper-reflective material. IRHM - intraretinal hyper-reflective material      Intravitreal Injection, Pharmacologic Agent - OD - Right Eye       Time Out 10/05/2022. 9:31 AM. Confirmed correct patient, procedure, site, and patient consented.   Anesthesia Topical anesthesia was used. Anesthetic medications included Lidocaine 2%, Proparacaine 0.5%.   Procedure Preparation included 5% betadine to ocular surface, eyelid speculum. A (32g) needle was used.   Injection: 2 mg aflibercept 2 MG/0.05ML   Route: Intravitreal, Site: Right Eye   NDC: L6038910, Lot: [id number], Expiration date: 10/23/2023, Waste: 0 mL   Post-op Post injection exam found visual acuity of at least counting fingers. The patient tolerated the procedure well. There were no complications. The patient received written and verbal post procedure care education.      Intravitreal Injection, Pharmacologic Agent - OS - Left Eye       Time Out 10/05/2022. 9:31 AM. Confirmed correct patient, procedure, site, and patient consented.   Anesthesia Topical anesthesia was used. Anesthetic medications included Lidocaine 2%, Proparacaine 0.5%.   Procedure Preparation included 5% betadine to ocular surface, eyelid speculum. A (32g) needle was used.   Injection: 2 mg aflibercept 2 MG/0.05ML   Route: Intravitreal, Site: Left Eye  NDC: L6038910, Lot: [id number], Expiration date: 05/23/2023, Waste: 0 mL   Post-op Post injection exam found visual acuity of at least counting fingers. The patient tolerated the  procedure well. There were no complications. The patient received written and verbal post procedure care education.            ASSESSMENT/PLAN:    ICD-10-CM   1. Proliferative diabetic retinopathy of both eyes with macular edema associated with type 2 diabetes mellitus (HCC)  E11.3513 OCT, Retina - OU - Both Eyes    Intravitreal Injection, Pharmacologic Agent - OD - Right Eye    Intravitreal Injection, Pharmacologic Agent - OS - Left Eye    aflibercept (EYLEA) SOLN 2 mg    aflibercept (EYLEA) SOLN 2 mg    2. Current use of insulin (HCC)  Z79.4     3. Long term (current) use of oral hypoglycemic drugs  Z79.84     4. Hyperlipidemia associated with type 2 diabetes mellitus (HCC)  E11.69    E78.5     5. Essential hypertension  I10     6. Hypertensive retinopathy of both eyes  H35.033      1-3. Proliferative diabetic retinopathy, both eyes  - A1c 7.4 on 08.11.23  - s/p IVA OS #1 (02.22.24), #2 (03.22.24) #3 (04.19.24)   -s/p IVA OD #1 (02.23.24), #2 (03.22.24) #3 (04.19.24), #4 (05.17.24), #5 (06.18.24)  - s/p IVE OD #1 (06.18.24), #2 (07.16.24)  - s/p IVE OS #1 (05.17.24), #2 (06.18.24), #3 (7.16.24) - exam shows severe edema with scattered MA and DBH, but significant lipid component to edema OU - FA (02.21.24) shows OD: Large cluster of leaking NV along IT arcades, scattered patches of vascular non-perfusion; OS: +NVD and NVE along proximal arcades - BCVA OD stable at 20/60; OS improved to 20/150 from 20/200 - OCT shows OD: persistent IRF, IRHM, SRHM; OS: Mild interval improvement in diffuse edema and IRHM; +ERM with pucker at 4 wks - good response to IVE - recommend IVE OU (OD #3 and OS #4) today, 08.13.24 w/ f/u in 4 wks - pt wishes to proceed - RBA of procedure discussed, questions answered - IVE informed consent obtained and signed, 05.17.24 (OU) - see procedure note - f/u 4 weeks -- DFE/OCT, possible injection OU  4. Hyperlipidemia  - review of labs shows severely  elevated cholesterol and lipids  - 08.11.23:  Trig  785 Chol  598  LDL  410 - likely contributing to severe edema and exudates  5,6. Hypertensive retinopathy OU - discussed importance of tight BP control - continue to monitor   Ophthalmic Meds Ordered this visit:  Meds ordered this encounter  Medications   aflibercept (EYLEA) SOLN 2 mg   aflibercept (EYLEA) SOLN 2 mg     Return in about 4 weeks (around 11/02/2022) for f/u PDR OU, DFE, OCT.  There are no Patient Instructions on file for this visit.  This document serves as a record of services personally performed by Karie Chimera, MD, PhD. It was created on their behalf by Gerilyn Nestle, COT an ophthalmic technician. The creation of this record is the provider's dictation and/or activities during the visit.    Electronically signed by:  Charlette Caffey, COT  10/05/22 12:35 PM  This document serves as a record of services personally performed by Karie Chimera, MD, PhD. It was created on their behalf by Glee Arvin. Manson Passey, OA an ophthalmic technician. The creation of this record is the provider's dictation and/or activities  during the visit.    Electronically signed by: Glee Arvin. Manson Passey, OA 10/05/22 12:35 PM  Karie Chimera, M.D., Ph.D. Diseases & Surgery of the Retina and Vitreous [facility]  I have reviewed the above documentation for accuracy and completeness, and I agree with the above. Karie Chimera, M.D., Ph.D. 10/05/22 12:35 PM  Abbreviations: M myopia (nearsighted); A astigmatism; H hyperopia (farsighted); P presbyopia; Mrx spectacle prescription;  CTL contact lenses; OD right eye; OS left eye; OU both eyes  XT exotropia; ET esotropia; PEK punctate epithelial keratitis; PEE punctate epithelial erosions; DES dry eye syndrome; MGD meibomian gland dysfunction; ATs artificial tears; PFAT's preservative free artificial tears; NSC nuclear sclerotic cataract; PSC posterior subcapsular cataract;  ERM epi-retinal membrane; PVD posterior vitreous detachment; RD retinal detachment; DM diabetes mellitus; DR diabetic retinopathy; NPDR non-proliferative diabetic retinopathy; PDR proliferative diabetic retinopathy; CSME clinically significant macular edema; DME diabetic macular edema; dbh dot blot hemorrhages; CWS cotton wool spot; POAG primary open angle glaucoma; C/D cup-to-disc ratio; HVF humphrey visual field; GVF goldmann visual field; OCT optical coherence tomography; IOP intraocular pressure; BRVO Branch retinal vein occlusion; CRVO central retinal vein occlusion; CRAO central retinal artery occlusion; BRAO branch retinal artery occlusion; RT retinal tear; SB scleral buckle; PPV pars plana vitrectomy; VH Vitreous hemorrhage; PRP panretinal laser photocoagulation; IVK intravitreal kenalog; VMT vitreomacular traction; MH Macular hole;  NVD neovascularization of the disc; NVE neovascularization elsewhere; AREDS age related eye disease study; ARMD age related macular degeneration; POAG primary open angle glaucoma; EBMD epithelial/anterior basement membrane dystrophy; ACIOL anterior chamber intraocular lens; IOL intraocular lens; PCIOL posterior chamber intraocular lens; Phaco/IOL phacoemulsification with intraocular lens placement; PRK photorefractive keratectomy; LASIK laser assisted in situ keratomileusis; HTN hypertension; DM diabetes mellitus; COPD chronic obstructive pulmonary disease

## 2022-10-05 ENCOUNTER — Ambulatory Visit (INDEPENDENT_AMBULATORY_CARE_PROVIDER_SITE_OTHER): Payer: 59 | Admitting: Ophthalmology

## 2022-10-05 ENCOUNTER — Encounter (INDEPENDENT_AMBULATORY_CARE_PROVIDER_SITE_OTHER): Payer: Self-pay | Admitting: Ophthalmology

## 2022-10-05 DIAGNOSIS — E1169 Type 2 diabetes mellitus with other specified complication: Secondary | ICD-10-CM

## 2022-10-05 DIAGNOSIS — E113513 Type 2 diabetes mellitus with proliferative diabetic retinopathy with macular edema, bilateral: Secondary | ICD-10-CM

## 2022-10-05 DIAGNOSIS — Z794 Long term (current) use of insulin: Secondary | ICD-10-CM

## 2022-10-05 DIAGNOSIS — Z7984 Long term (current) use of oral hypoglycemic drugs: Secondary | ICD-10-CM | POA: Diagnosis not present

## 2022-10-05 DIAGNOSIS — H35033 Hypertensive retinopathy, bilateral: Secondary | ICD-10-CM

## 2022-10-05 DIAGNOSIS — I10 Essential (primary) hypertension: Secondary | ICD-10-CM | POA: Diagnosis not present

## 2022-10-05 MED ORDER — AFLIBERCEPT 2MG/0.05ML IZ SOLN FOR KALEIDOSCOPE
2.0000 mg | INTRAVITREAL | Status: AC | PRN
Start: 2022-10-05 — End: 2022-10-05
  Administered 2022-10-05: 2 mg via INTRAVITREAL

## 2022-10-08 ENCOUNTER — Ambulatory Visit (HOSPITAL_COMMUNITY)
Admission: RE | Admit: 2022-10-08 | Discharge: 2022-10-08 | Disposition: A | Discharge status: Home / Self Care | Payer: 59 | Source: Ambulatory Visit | Attending: Family Medicine | Admitting: Family Medicine

## 2022-10-08 DIAGNOSIS — E785 Hyperlipidemia, unspecified: Secondary | ICD-10-CM | POA: Insufficient documentation

## 2022-10-13 ENCOUNTER — Ambulatory Visit (HOSPITAL_COMMUNITY)
Admission: RE | Admit: 2022-10-13 | Discharge: 2022-10-13 | Disposition: A | Discharge status: Home / Self Care | Payer: 59 | Source: Ambulatory Visit | Attending: Vascular Surgery | Admitting: Vascular Surgery

## 2022-10-13 ENCOUNTER — Ambulatory Visit (INDEPENDENT_AMBULATORY_CARE_PROVIDER_SITE_OTHER): Payer: 59 | Admitting: Physician Assistant

## 2022-10-13 VITALS — BP 105/73 | HR 93 | Temp 97.9°F | Resp 18 | Ht 68.0 in | Wt 166.7 lb

## 2022-10-13 DIAGNOSIS — I739 Peripheral vascular disease, unspecified: Secondary | ICD-10-CM | POA: Diagnosis not present

## 2022-10-13 DIAGNOSIS — M79606 Pain in leg, unspecified: Secondary | ICD-10-CM | POA: Diagnosis not present

## 2022-10-13 LAB — VAS US ABI WITH/WO TBI
Left ABI: 0.68
Right ABI: 0.78

## 2022-10-13 NOTE — Progress Notes (Signed)
Office Note   History of Present Illness   Jose Koch is a 52 y.o. (31-Mar-1970) male who presents for surveillance of PAD. He has a history of left calf claudication. He has never undergone prior revascularization.  The patient returns today for follow up. He states things have been going well. He tries to get out of the house multiple times a day to walk short distances. He can walk about 0.25 miles before his left calf starts cramping. This is not lifestyle limiting. He denies any rest pain or tissue loss.   Current Outpatient Medications  Medication Sig Dispense Refill   aspirin EC 81 MG tablet Take 81 mg by mouth daily. Swallow whole.     atorvastatin (LIPITOR) 40 MG tablet Take 40 mg by mouth daily.     blood glucose meter kit and supplies KIT Dispense based on patient and insurance preference (something with affordable test strips). Use to check glucose twice daily as directed. 1 each 0   fenofibrate 160 MG tablet Take 160 mg by mouth daily.     fluticasone (FLONASE) 50 MCG/ACT nasal spray Place 2 sprays into both nostrils daily.     folic acid (FOLVITE) 1 MG tablet Take 1 tablet (1 mg total) by mouth daily. 90 tablet 4   glucose blood (ACCU-CHEK GUIDE) test strip Use as instructed to monitor glucose twice daily, before breakfast and before bed 100 each 12   Insulin Glargine (BASAGLAR KWIKPEN) 100 UNIT/ML Inject 30 Units into the skin at bedtime.     Insulin Pen Needle 31G X 5 MM MISC 1 Units by Does not apply route daily. 100 each 1   JANUVIA 100 MG tablet Take 100 mg by mouth daily.     JARDIANCE 25 MG TABS tablet Take 25 mg by mouth daily.     levETIRAcetam (KEPPRA) 500 MG tablet Take 1 tablet (500 mg total) by mouth 2 (two) times daily. 180 tablet 3   metFORMIN (GLUCOPHAGE XR) 500 MG 24 hr tablet Take 1 tablet (500 mg total) by mouth daily with breakfast. 90 tablet 0   metFORMIN (GLUCOPHAGE) 1000 MG tablet Take 1,000 mg by mouth daily.     metoCLOPramide (REGLAN) 10 MG  tablet Take 10 mg by mouth 3 (three) times daily before meals.     Misc Natural Products (OSTEO BI-FLEX ADV DOUBLE ST PO) Take 1 tablet by mouth.     OneTouch Delica Lancets 33G MISC PLEASE SEE ATTACHED FOR DETAILED DIRECTIONS     pantoprazole (PROTONIX) 40 MG tablet TAKE 1 TABLET BY MOUTH 2 TIMES DAILY BEFORE A MEAL 30 MINUTES BEFORE BREAKFAST AND DINNER 60 tablet 3   ticagrelor (BRILINTA) 90 MG TABS tablet Take 1 tablet (90 mg total) by mouth 2 (two) times daily. 60 tablet 5   No current facility-administered medications for this visit.    REVIEW OF SYSTEMS (negative unless checked):   Cardiac:  []  Chest pain or chest pressure? []  Shortness of breath upon activity? []  Shortness of breath when lying flat? []  Irregular heart rhythm?  Vascular:  [x]  Pain in calf, thigh, or hip brought on by walking? []  Pain in feet at night that wakes you up from your sleep? []  Blood clot in your veins? []  Leg swelling?  Pulmonary:  []  Oxygen at home? []  Productive cough? []  Wheezing?  Neurologic:  []  Sudden weakness in arms or legs? []  Sudden numbness in arms or legs? []  Sudden onset of difficult speaking or slurred speech? []  Temporary loss  of vision in one eye? []  Problems with dizziness?  Gastrointestinal:  []  Blood in stool? []  Vomited blood?  Genitourinary:  []  Burning when urinating? []  Blood in urine?  Psychiatric:  []  Major depression  Hematologic:  []  Bleeding problems? []  Problems with blood clotting?  Dermatologic:  []  Rashes or ulcers?  Constitutional:  []  Fever or chills?  Ear/Nose/Throat:  []  Change in hearing? []  Nose bleeds? []  Sore throat?  Musculoskeletal:  []  Back pain? []  Joint pain? []  Muscle pain?   Physical Examination   Vitals:   10/13/22 1100  BP: 105/73  Pulse: 93  Resp: 18  Temp: 97.9 F (36.6 C)  TempSrc: Temporal  SpO2: 96%  Weight: 166 lb 11.2 oz (75.6 kg)  Height: 5\' 8"  (1.727 m)   Body mass index is 25.35  kg/m.  General:  WDWN in NAD; vital signs documented above Gait: Not observed HENT: WNL, normocephalic Pulmonary: normal non-labored breathing , without rales, rhonchi,  wheezing Cardiac: regular Abdomen: soft, NT, no masses Skin: without rashes Vascular Exam/Pulses: nonpalpable pedal pulses. Brisk DP doppler signals bilaterally Extremities: without ischemic changes, without gangrene , without cellulitis; without open wounds;  Musculoskeletal: no muscle wasting or atrophy  Neurologic: A&O X 3;  No focal weakness or paresthesias are detected Psychiatric:  The pt has Normal affect.  Non-Invasive Vascular imaging   ABI (10/13/2022) R:  ABI: 0.78 (0.87),  PT: none DP: tri TBI:  0.58 L:  ABI: 0.68 (0.67),  PT: mono DP: tri TBI: 0.60    Medical Decision Making   Jose Koch is a 52 y.o. male who presents for surveillance of PAD  Based on the patient's vascular studies, his ABIs are essentially unchanged. His right ABI is 0.78 and left ABI is 0.68 On exam he does not have palpable pedal pulses. He does have brisk DP doppler signals bilaterally He denies any rest pain or tissue loss. He continues to have left calf claudication at distances farther than 0.25 miles, but this is not lifestyle limiting He will continue his regular walking schedule and follow up with our office in 1 year with repeat ABIs  Loel Dubonnet PA-C [facility] Office: [phone]  Clinic MD: Randie Heinz

## 2022-10-20 NOTE — Progress Notes (Signed)
[facility] - Clinic Note  11/02/2022     CHIEF COMPLAINT Patient presents for Retina Follow Up   HISTORY OF PRESENT ILLNESS: Jose Koch is a 52 y.o. male who presents to the clinic today for:   HPI     Retina Follow Up   Patient presents with  Diabetic Retinopathy.  In both eyes.  This started months ago.  Duration of 4 weeks.  I, the attending physician,  performed the HPI with the patient and updated documentation appropriately.        Comments   Patient feels the vision is improving. His blood sugar was 185. He is not using eye drops.       Last edited by Rennis Chris, MD on 11/02/2022 12:08 PM.    Pt states when he was reading the eye chart this morning he felt like he could see "down" as far as he normally can, no new health concerns  Referring physician: Benita Stabile, MD [address] [place],  [place] [address]  HISTORICAL INFORMATION:   Selected notes from the MEDICAL RECORD NUMBER Referred by Dr. Illene Labrador LEE:  Ocular Hx- PMH-    CURRENT MEDICATIONS: No current outpatient medications on file. (Ophthalmic Drugs)   No current facility-administered medications for this visit. (Ophthalmic Drugs)   Current Outpatient Medications (Other)  Medication Sig   aspirin EC 81 MG tablet Take 81 mg by mouth daily. Swallow whole.   atorvastatin (LIPITOR) 40 MG tablet Take 40 mg by mouth daily.   blood glucose meter kit and supplies KIT Dispense based on patient and insurance preference (something with affordable test strips). Use to check glucose twice daily as directed.   fenofibrate 160 MG tablet Take 160 mg by mouth daily.   fluticasone (FLONASE) 50 MCG/ACT nasal spray Place 2 sprays into both nostrils daily.   folic acid (FOLVITE) 1 MG tablet TAKE 1 TABLET BY MOUTH EVERY DAY   glucose blood (ACCU-CHEK GUIDE) test strip Use as instructed to monitor glucose twice daily, before breakfast and before bed   Insulin Glargine (BASAGLAR  KWIKPEN) 100 UNIT/ML Inject 30 Units into the skin at bedtime.   Insulin Pen Needle 31G X 5 MM MISC 1 Units by Does not apply route daily.   JANUVIA 100 MG tablet Take 100 mg by mouth daily.   JARDIANCE 25 MG TABS tablet Take 25 mg by mouth daily.   levETIRAcetam (KEPPRA) 500 MG tablet Take 1 tablet (500 mg total) by mouth 2 (two) times daily.   metFORMIN (GLUCOPHAGE XR) 500 MG 24 hr tablet Take 1 tablet (500 mg total) by mouth daily with breakfast.   metFORMIN (GLUCOPHAGE) 1000 MG tablet Take 1,000 mg by mouth daily.   metoCLOPramide (REGLAN) 10 MG tablet Take 10 mg by mouth 3 (three) times daily before meals.   Misc Natural Products (OSTEO BI-FLEX ADV DOUBLE ST PO) Take 1 tablet by mouth.   OneTouch Delica Lancets 33G MISC PLEASE SEE ATTACHED FOR DETAILED DIRECTIONS   pantoprazole (PROTONIX) 40 MG tablet TAKE 1 TABLET BY MOUTH 2 TIMES DAILY BEFORE A MEAL 30 MINUTES BEFORE BREAKFAST AND DINNER   ticagrelor (BRILINTA) 90 MG TABS tablet Take 1 tablet (90 mg total) by mouth 2 (two) times daily.   No current facility-administered medications for this visit. (Other)   REVIEW OF SYSTEMS: ROS   Positive for: Cardiovascular, Eyes, Respiratory Last edited by Rennis Chris, MD on 11/02/2022 12:09 PM.      ALLERGIES No  Known Allergies  PAST MEDICAL HISTORY Past Medical History:  Diagnosis Date   COVID    COVID 02/2020   very sick - admitted to the hospital   Diabetes mellitus without complication (HCC)    GERD (gastroesophageal reflux disease)    Headache    High cholesterol    History of kidney stones    Hypertension    was diagnosed 2 years ago, never treated and states BP is always - documented 07/13/21   Ichthyosis vulgaris    Pneumonia    Sleep apnea    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   Past Surgical History:  Procedure Laterality Date   IR ANGIO INTRA EXTRACRAN SEL COM CAROTID INNOMINATE BILAT MOD SED  04/17/2021   IR ANGIO INTRA EXTRACRAN SEL INTERNAL CAROTID UNI R  MOD SED  07/17/2021   IR ANGIO VERTEBRAL SEL VERTEBRAL BILAT MOD SED  04/17/2021   IR CT HEAD LTD  07/15/2021   IR INTRA CRAN STENT  07/15/2021   IR RADIOLOGIST EVAL & MGMT  03/05/2021   IR RADIOLOGIST EVAL & MGMT  05/04/2021   IR RADIOLOGIST EVAL & MGMT  07/31/2021   IR US GUIDE VASC ACCESS RIGHT  04/17/2021   IR US GUIDE VASC ACCESS RIGHT  07/15/2021   RADIOLOGY WITH ANESTHESIA N/A 07/15/2021   Procedure: STENTING;  Surgeon: Julieanne Cotton, MD;  Location: [facility] OR;  Service: Radiology;  Laterality: N/A;   TONSILLECTOMY     wisdom teeth removal     FAMILY HISTORY Family History  Family history unknown: Yes   SOCIAL HISTORY Social History   Tobacco Use   Smoking status: Never   Smokeless tobacco: Never  Vaping Use   Vaping status: Never Used  Substance Use Topics   Alcohol use: Not Currently   Drug use: Never       OPHTHALMIC EXAM:  Base Eye Exam     Visual Acuity (Snellen - Linear)       Right Left   Dist cc 20/60 20/200   Dist ph cc 20/60 20/150    Correction: Glasses         Tonometry (Tonopen, 8:28 AM)       Right Left   Pressure 16 11         Pupils       Dark Light Shape React APD   Right 3 2 Round Brisk None   Left 3 2 Round Brisk None         Visual Fields       Left Right    Full Full         Extraocular Movement       Right Left    Full, Ortho Full, Ortho         Neuro/Psych     Oriented x3: Yes   Mood/Affect: Normal         Dilation     Both eyes: 1.0% Mydriacyl @ 8:24 AM           Slit Lamp and Fundus Exam     Slit Lamp Exam       Right Left   Lids/Lashes Dermatochalasis - upper lid, Meibomian gland dysfunction, +Scurf Dermatochalasis - upper lid, Meibomian gland dysfunction, +Scurf   Conjunctiva/Sclera White and quiet White and quiet   Cornea trace PEE, mild tear film debris 1+PEE, mild tear film debris   Anterior Chamber deep and clear deep and clear   Iris Round and dilated, No NVI  Round and dilated, No NVI    Lens Clear Clear   Anterior Vitreous Vitreous syneresis mild syneresis         Fundus Exam       Right Left   Disc Pink and Sharp, no heme Pink and sharp, fine NVD -- regressed, 360 exudates   C/D Ratio 0.2 0.1   Macula Blunted foveal reflex, severe edema with severe exudation -- slightly improved, scattered DBH Blunted foveal reflex, severe edema and exudation, scattered DBH -- all improved   Vessels attenuated, Tortuous, +NVE greatest along inferior arcades -- improving, +Sheathing -- improving attenuated, Tortuous, +NV, +Sheathing -- improving   Periphery Attached, scattered MA/DBH, heavy exudates greatest posteriorly Attached, severe exudation posteriorly, scattered MA / DBH           Refraction     Wearing Rx       Sphere Cylinder Axis Add   Right -2.50 +1.00 049 +1.00   Left -0.75 +1.25 178 +1.00            IMAGING AND PROCEDURES  Imaging and Procedures for 11/02/2022  OCT, Retina - OU - Both Eyes       Right Eye Quality was good. Central Foveal Thickness: 315. Progression has been stable. Findings include no SRF, abnormal foveal contour, subretinal hyper-reflective material, intraretinal hyper-reflective material, intraretinal fluid, vitreomacular adhesion (persistent IRF, IRHM and SRHM -- slightly improved).   Left Eye Quality was good. Central Foveal Thickness: 403. Progression has improved. Findings include no SRF, abnormal foveal contour, subretinal hyper-reflective material, intraretinal hyper-reflective material, epiretinal membrane, intraretinal fluid, lamellar hole, macular pucker (Mild interval improvement in diffuse edema, IRHM and SRHM, +ERM with pucker).   Notes *Images captured and stored on drive  Diagnosis / Impression:  OD: persistent IRF, IRHM and SRHM -- slightly improved OS: Mild interval improvement in diffuse edema, IRHM and SRHM, +ERM with pucker  Clinical management:  See below  Abbreviations: NFP - Normal foveal profile. CME -  cystoid macular edema. PED - pigment epithelial detachment. IRF - intraretinal fluid. SRF - subretinal fluid. EZ - ellipsoid zone. ERM - epiretinal membrane. ORA - outer retinal atrophy. ORT - outer retinal tubulation. SRHM - subretinal hyper-reflective material. IRHM - intraretinal hyper-reflective material      Intravitreal Injection, Pharmacologic Agent - OD - Right Eye       Time Out 11/02/2022. 8:59 AM. Confirmed correct patient, procedure, site, and patient consented.   Anesthesia Topical anesthesia was used. Anesthetic medications included Lidocaine 2%, Proparacaine 0.5%.   Procedure Preparation included 5% betadine to ocular surface, eyelid speculum. A (32g) needle was used.   Injection: 2 mg aflibercept 2 MG/0.05ML   Route: Intravitreal, Site: Right Eye   NDC: L6038910, Lot: [id number], Expiration date: 01/22/2024, Waste: 0 mL   Post-op Post injection exam found visual acuity of at least counting fingers. The patient tolerated the procedure well. There were no complications. The patient received written and verbal post procedure care education.      Intravitreal Injection, Pharmacologic Agent - OS - Left Eye       Time Out 11/02/2022. 8:59 AM. Confirmed correct patient, procedure, site, and patient consented.   Anesthesia Topical anesthesia was used. Anesthetic medications included Lidocaine 2%, Proparacaine 0.5%.   Procedure Preparation included 5% betadine to ocular surface, eyelid speculum. A (32g) needle was used.   Injection: 2 mg aflibercept 2 MG/0.05ML   Route: Intravitreal, Site: Left Eye   NDC: L6038910, Lot: [id number], Expiration date: 01/22/2024, Waste: 0 mL  Post-op Post injection exam found visual acuity of at least counting fingers. The patient tolerated the procedure well. There were no complications. The patient received written and verbal post procedure care education.             ASSESSMENT/PLAN:    ICD-10-CM   1.  Proliferative diabetic retinopathy of both eyes with macular edema associated with type 2 diabetes mellitus (HCC)  E11.3513 OCT, Retina - OU - Both Eyes    Intravitreal Injection, Pharmacologic Agent - OD - Right Eye    Intravitreal Injection, Pharmacologic Agent - OS - Left Eye    aflibercept (EYLEA) SOLN 2 mg    aflibercept (EYLEA) SOLN 2 mg    2. Current use of insulin (HCC)  Z79.4     3. Long term (current) use of oral hypoglycemic drugs  Z79.84     4. Hyperlipidemia associated with type 2 diabetes mellitus (HCC)  E11.69    E78.5     5. Essential hypertension  I10     6. Hypertensive retinopathy of both eyes  H35.033       1-3. Proliferative diabetic retinopathy, both eyes  - A1c 7.4 on 08.11.23  - s/p IVA OS #1 (02.22.24), #2 (03.22.24) #3 (04.19.24)   -s/p IVA OD #1 (02.23.24), #2 (03.22.24) #3 (04.19.24), #4 (05.17.24), #5 (06.18.24)  - s/p IVE OD #1 (06.18.24), #2 (07.16.24), #3 (08.13.24)  - s/p IVE OS #1 (05.17.24), #2 (06.18.24), #3 (7.16.24), #4 (08.13.24) - exam shows severe edema with scattered MA and DBH, but significant lipid component to edema OU - FA (02.21.24) shows OD: Large cluster of leaking NV along IT arcades, scattered patches of vascular non-perfusion; OS: +NVD and NVE along proximal arcades - BCVA OD stable at 20/60; OS stable at 20/150 - OCT shows OD: persistent IRF, IRHM and SRHM -- slightly improved; OS: Mild interval improvement in diffuse edema, IRHM and SRHM, +ERM with pucker at 4 wks - good response to IVE - recommend IVE OU (OD #4 and OS #5) today, 09.10.24 w/ f/u in 4 wks - pt wishes to proceed - RBA of procedure discussed, questions answered - IVE informed consent obtained and signed, 05.17.24 (OU) - see procedure note - f/u 4 weeks -- DFE/OCT, possible injection OU  4. Hyperlipidemia  - review of labs shows severely elevated cholesterol and lipids  - 08.11.23:  Trig  785 Chol  598  LDL  410 - likely contributing to severe edema and  exudates  5,6. Hypertensive retinopathy OU - discussed importance of tight BP control - continue to monitor   Ophthalmic Meds Ordered this visit:  Meds ordered this encounter  Medications   aflibercept (EYLEA) SOLN 2 mg   aflibercept (EYLEA) SOLN 2 mg     Return in about 4 weeks (around 11/30/2022) for f/u PDR OU, DFE, OCT.  There are no Patient Instructions on file for this visit.  This document serves as a record of services personally performed by Karie Chimera, MD, PhD. It was created on their behalf by Laurey Morale, COT an ophthalmic technician. The creation of this record is the provider's dictation and/or activities during the visit.    Electronically signed by:  Charlette Caffey, COT  11/02/22 12:09 PM  This document serves as a record of services personally performed by Karie Chimera, MD, PhD. It was created on their behalf by Glee Arvin. Manson Passey, OA an ophthalmic technician. The creation of this record is the provider's dictation and/or activities during the visit.  Electronically signed by: Glee Arvin. Manson Passey, OA 11/02/22 12:09 PM  Karie Chimera, M.D., Ph.D. Diseases & Surgery of the Retina and Vitreous [facility]  I have reviewed the above documentation for accuracy and completeness, and I agree with the above. Karie Chimera, M.D., Ph.D. 11/02/22 12:10 PM  Abbreviations: M myopia (nearsighted); A astigmatism; H hyperopia (farsighted); P presbyopia; Mrx spectacle prescription;  CTL contact lenses; OD right eye; OS left eye; OU both eyes  XT exotropia; ET esotropia; PEK punctate epithelial keratitis; PEE punctate epithelial erosions; DES dry eye syndrome; MGD meibomian gland dysfunction; ATs artificial tears; PFAT's preservative free artificial tears; NSC nuclear sclerotic cataract; PSC posterior subcapsular cataract; ERM epi-retinal membrane; PVD posterior vitreous detachment; RD retinal detachment; DM diabetes mellitus; DR diabetic  retinopathy; NPDR non-proliferative diabetic retinopathy; PDR proliferative diabetic retinopathy; CSME clinically significant macular edema; DME diabetic macular edema; dbh dot blot hemorrhages; CWS cotton wool spot; POAG primary open angle glaucoma; C/D cup-to-disc ratio; HVF humphrey visual field; GVF goldmann visual field; OCT optical coherence tomography; IOP intraocular pressure; BRVO Branch retinal vein occlusion; CRVO central retinal vein occlusion; CRAO central retinal artery occlusion; BRAO branch retinal artery occlusion; RT retinal tear; SB scleral buckle; PPV pars plana vitrectomy; VH Vitreous hemorrhage; PRP panretinal laser photocoagulation; IVK intravitreal kenalog; VMT vitreomacular traction; MH Macular hole;  NVD neovascularization of the disc; NVE neovascularization elsewhere; AREDS age related eye disease study; ARMD age related macular degeneration; POAG primary open angle glaucoma; EBMD epithelial/anterior basement membrane dystrophy; ACIOL anterior chamber intraocular lens; IOL intraocular lens; PCIOL posterior chamber intraocular lens; Phaco/IOL phacoemulsification with intraocular lens placement; PRK photorefractive keratectomy; LASIK laser assisted in situ keratomileusis; HTN hypertension; DM diabetes mellitus; COPD chronic obstructive pulmonary disease

## 2022-10-30 ENCOUNTER — Other Ambulatory Visit: Payer: Self-pay | Admitting: Hematology

## 2022-11-02 ENCOUNTER — Ambulatory Visit (INDEPENDENT_AMBULATORY_CARE_PROVIDER_SITE_OTHER): Payer: 59 | Admitting: Ophthalmology

## 2022-11-02 ENCOUNTER — Encounter (INDEPENDENT_AMBULATORY_CARE_PROVIDER_SITE_OTHER): Payer: Self-pay | Admitting: Ophthalmology

## 2022-11-02 ENCOUNTER — Other Ambulatory Visit: Payer: Self-pay

## 2022-11-02 DIAGNOSIS — H35033 Hypertensive retinopathy, bilateral: Secondary | ICD-10-CM

## 2022-11-02 DIAGNOSIS — E113513 Type 2 diabetes mellitus with proliferative diabetic retinopathy with macular edema, bilateral: Secondary | ICD-10-CM

## 2022-11-02 DIAGNOSIS — Z794 Long term (current) use of insulin: Secondary | ICD-10-CM | POA: Diagnosis not present

## 2022-11-02 DIAGNOSIS — I10 Essential (primary) hypertension: Secondary | ICD-10-CM

## 2022-11-02 DIAGNOSIS — Z7984 Long term (current) use of oral hypoglycemic drugs: Secondary | ICD-10-CM | POA: Diagnosis not present

## 2022-11-02 DIAGNOSIS — E785 Hyperlipidemia, unspecified: Secondary | ICD-10-CM

## 2022-11-02 DIAGNOSIS — I739 Peripheral vascular disease, unspecified: Secondary | ICD-10-CM

## 2022-11-02 MED ORDER — AFLIBERCEPT 2MG/0.05ML IZ SOLN FOR KALEIDOSCOPE
2.0000 mg | INTRAVITREAL | Status: AC | PRN
Start: 2022-11-02 — End: 2022-11-02
  Administered 2022-11-02: 2 mg via INTRAVITREAL

## 2022-11-07 ENCOUNTER — Other Ambulatory Visit: Payer: Self-pay | Admitting: Hematology

## 2022-11-16 NOTE — Progress Notes (Signed)
[facility] - Clinic Note  11/30/2022     CHIEF COMPLAINT Patient presents for Retina Follow Up   HISTORY OF PRESENT ILLNESS: Jose Koch is a 52 y.o. male who presents to the clinic today for:   HPI     Retina Follow Up   Patient presents with  Diabetic Retinopathy.  In both eyes.  This started 4 weeks ago.  I, the attending physician,  performed the HPI with the patient and updated documentation appropriately.        Comments   Patient here for 4 weeks retina follow up for PDR OU. Patient states vision is doing ok.  no eye pain.      Last edited by Rennis Chris, MD on 11/30/2022  5:55 PM.    Patient states the vision in the same. He feels the blob in the left eye is thinning out.  Referring physician: Benita Stabile, MD [address] [place],  [place] [address]  HISTORICAL INFORMATION:   Selected notes from the MEDICAL RECORD NUMBER Referred by Dr. Illene Labrador LEE:  Ocular Hx- PMH-    CURRENT MEDICATIONS: No current outpatient medications on file. (Ophthalmic Drugs)   No current facility-administered medications for this visit. (Ophthalmic Drugs)   Current Outpatient Medications (Other)  Medication Sig   aspirin EC 81 MG tablet Take 81 mg by mouth daily. Swallow whole.   atorvastatin (LIPITOR) 40 MG tablet Take 40 mg by mouth daily.   blood glucose meter kit and supplies KIT Dispense based on patient and insurance preference (something with affordable test strips). Use to check glucose twice daily as directed.   fenofibrate 160 MG tablet Take 160 mg by mouth daily.   fluticasone (FLONASE) 50 MCG/ACT nasal spray Place 2 sprays into both nostrils daily.   folic acid (FOLVITE) 1 MG tablet TAKE 1 TABLET BY MOUTH EVERY DAY   glucose blood (ACCU-CHEK GUIDE) test strip Use as instructed to monitor glucose twice daily, before breakfast and before bed   Insulin Glargine (BASAGLAR KWIKPEN) 100 UNIT/ML Inject 30 Units into the skin at bedtime.    Insulin Pen Needle 31G X 5 MM MISC 1 Units by Does not apply route daily.   JANUVIA 100 MG tablet Take 100 mg by mouth daily.   JARDIANCE 25 MG TABS tablet Take 25 mg by mouth daily.   levETIRAcetam (KEPPRA) 500 MG tablet Take 1 tablet (500 mg total) by mouth 2 (two) times daily.   metFORMIN (GLUCOPHAGE XR) 500 MG 24 hr tablet Take 1 tablet (500 mg total) by mouth daily with breakfast.   metFORMIN (GLUCOPHAGE) 1000 MG tablet Take 1,000 mg by mouth daily.   metoCLOPramide (REGLAN) 10 MG tablet Take 10 mg by mouth 3 (three) times daily before meals.   Misc Natural Products (OSTEO BI-FLEX ADV DOUBLE ST PO) Take 1 tablet by mouth.   OneTouch Delica Lancets 33G MISC PLEASE SEE ATTACHED FOR DETAILED DIRECTIONS   pantoprazole (PROTONIX) 40 MG tablet TAKE 1 TABLET BY MOUTH 2 TIMES DAILY BEFORE A MEAL 30 MINUTES BEFORE BREAKFAST AND DINNER   ticagrelor (BRILINTA) 90 MG TABS tablet Take 1 tablet (90 mg total) by mouth 2 (two) times daily.   No current facility-administered medications for this visit. (Other)   REVIEW OF SYSTEMS: ROS   Positive for: Cardiovascular, Eyes, Respiratory Last edited by Laddie Aquas, COA on 11/30/2022  9:28 AM.       ALLERGIES No Known Allergies  PAST MEDICAL HISTORY Past  Medical History:  Diagnosis Date   COVID    COVID 02/2020   very sick - admitted to the hospital   Diabetes mellitus without complication (HCC)    GERD (gastroesophageal reflux disease)    Headache    High cholesterol    History of kidney stones    Hypertension    was diagnosed 2 years ago, never treated and states BP is always - documented 07/13/21   Ichthyosis vulgaris    Pneumonia    Sleep apnea    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   Past Surgical History:  Procedure Laterality Date   IR ANGIO INTRA EXTRACRAN SEL COM CAROTID INNOMINATE BILAT MOD SED  04/17/2021   IR ANGIO INTRA EXTRACRAN SEL INTERNAL CAROTID UNI R MOD SED  07/17/2021   IR ANGIO VERTEBRAL SEL VERTEBRAL  BILAT MOD SED  04/17/2021   IR CT HEAD LTD  07/15/2021   IR INTRA CRAN STENT  07/15/2021   IR RADIOLOGIST EVAL & MGMT  03/05/2021   IR RADIOLOGIST EVAL & MGMT  05/04/2021   IR RADIOLOGIST EVAL & MGMT  07/31/2021   IR US GUIDE VASC ACCESS RIGHT  04/17/2021   IR US GUIDE VASC ACCESS RIGHT  07/15/2021   RADIOLOGY WITH ANESTHESIA N/A 07/15/2021   Procedure: STENTING;  Surgeon: Julieanne Cotton, MD;  Location: [facility] OR;  Service: Radiology;  Laterality: N/A;   TONSILLECTOMY     wisdom teeth removal     FAMILY HISTORY Family History  Family history unknown: Yes   SOCIAL HISTORY Social History   Tobacco Use   Smoking status: Never   Smokeless tobacco: Never  Vaping Use   Vaping status: Never Used  Substance Use Topics   Alcohol use: Not Currently   Drug use: Never       OPHTHALMIC EXAM:  Base Eye Exam     Visual Acuity (Snellen - Linear)       Right Left   Dist cc 20/60 -2 20/200 -1   Dist ph cc NI NI    Correction: Glasses         Tonometry (Tonopen, 9:26 AM)       Right Left   Pressure 15 13         Pupils       Dark Light Shape React APD   Right 3 2 Round Brisk None   Left 3 2 Round Brisk None         Visual Fields (Counting fingers)       Left Right    Full Full         Extraocular Movement       Right Left    Full, Ortho Full, Ortho         Neuro/Psych     Oriented x3: Yes   Mood/Affect: Normal         Dilation     Both eyes: 1.0% Mydriacyl, 2.5% Phenylephrine @ 9:26 AM           Slit Lamp and Fundus Exam     Slit Lamp Exam       Right Left   Lids/Lashes Dermatochalasis - upper lid, Meibomian gland dysfunction, +Scurf Dermatochalasis - upper lid, Meibomian gland dysfunction, +Scurf   Conjunctiva/Sclera White and quiet White and quiet   Cornea trace PEE, mild tear film debris 1+PEE, mild tear film debris   Anterior Chamber deep and clear deep and clear   Iris Round and dilated, No NVI Round  and dilated, No NVI   Lens Clear  Clear   Anterior Vitreous Vitreous syneresis mild syneresis         Fundus Exam       Right Left   Disc Pink and Sharp, no heme Pink and sharp, fine NVD -- regressed, 360 exudates   C/D Ratio 0.2 0.1   Macula Blunted foveal reflex, severe edema with severe exudation -- slightly improved, scattered DBH Blunted foveal reflex, severe edema and exudation, scattered DBH -- all slightly improved   Vessels attenuated, Tortuous, +NVE greatest along inferior arcades -- improving, +Sheathing -- improving attenuated, Tortuous, +NV, +Sheathing -- improving   Periphery Attached, scattered MA/DBH, heavy exudates greatest posteriorly Attached, severe exudation posteriorly, scattered MA / DBH           Refraction     Wearing Rx       Sphere Cylinder Axis Add   Right -2.50 +1.00 049 +1.00   Left -0.75 +1.25 178 +1.00            IMAGING AND PROCEDURES  Imaging and Procedures for 11/30/2022  OCT, Retina - OU - Both Eyes       Right Eye Quality was good. Central Foveal Thickness: 339. Progression has improved. Findings include no SRF, abnormal foveal contour, subretinal hyper-reflective material, intraretinal hyper-reflective material, intraretinal fluid, vitreomacular adhesion (persistent IRF, IRHM and SRHM; mild interval improvement in [facility]).   Left Eye Quality was good. Central Foveal Thickness: 380. Progression has improved. Findings include no SRF, abnormal foveal contour, subretinal hyper-reflective material, intraretinal hyper-reflective material, epiretinal membrane, intraretinal fluid, lamellar hole, macular pucker (Mild interval improvement in diffuse edema, IRHM and SRHM, +ERM with pucker).   Notes *Images captured and stored on drive  Diagnosis / Impression:  OD: persistent IRF, IRHM and SRHM; mild interval improvement in IRHM OS: Mild interval improvement in diffuse edema, IRHM and SRHM, +ERM with pucker  Clinical management:  See below  Abbreviations: NFP - Normal  foveal profile. CME - cystoid macular edema. PED - pigment epithelial detachment. IRF - intraretinal fluid. SRF - subretinal fluid. EZ - ellipsoid zone. ERM - epiretinal membrane. ORA - outer retinal atrophy. ORT - outer retinal tubulation. SRHM - subretinal hyper-reflective material. IRHM - intraretinal hyper-reflective material      Intravitreal Injection, Pharmacologic Agent - OD - Right Eye       Time Out 11/30/2022. 11:25 AM. Confirmed correct patient, procedure, site, and patient consented.   Anesthesia Topical anesthesia was used. Anesthetic medications included Lidocaine 2%, Proparacaine 0.5%.   Procedure Preparation included 5% betadine to ocular surface, eyelid speculum. A (32g) needle was used.   Injection: 2 mg aflibercept 2 MG/0.05ML   Route: Intravitreal, Site: Right Eye   NDC: L6038910, Lot: [id number], Expiration date: 12/23/2023, Waste: 0 mL   Post-op Post injection exam found visual acuity of at least counting fingers. The patient tolerated the procedure well. There were no complications. The patient received written and verbal post procedure care education.      Intravitreal Injection, Pharmacologic Agent - OS - Left Eye       Time Out 11/30/2022. 11:26 AM. Confirmed correct patient, procedure, site, and patient consented.   Anesthesia Topical anesthesia was used. Anesthetic medications included Lidocaine 2%, Proparacaine 0.5%.   Procedure Preparation included 5% betadine to ocular surface, eyelid speculum. A (32g) needle was used.   Injection: 2 mg aflibercept 2 MG/0.05ML   Route: Intravitreal, Site: Left Eye   NDC: L6038910, Lot: [id number], Expiration date: 12/23/2023, Waste:  0 mL   Post-op Post injection exam found visual acuity of at least counting fingers. The patient tolerated the procedure well. There were no complications. The patient received written and verbal post procedure care education.            ASSESSMENT/PLAN:     ICD-10-CM   1. Proliferative diabetic retinopathy of both eyes with macular edema associated with type 2 diabetes mellitus (HCC)  E11.3513 OCT, Retina - OU - Both Eyes    Intravitreal Injection, Pharmacologic Agent - OD - Right Eye    Intravitreal Injection, Pharmacologic Agent - OS - Left Eye    aflibercept (EYLEA) SOLN 2 mg    aflibercept (EYLEA) SOLN 2 mg    2. Current use of insulin (HCC)  Z79.4     3. Long term (current) use of oral hypoglycemic drugs  Z79.84     4. Hyperlipidemia associated with type 2 diabetes mellitus (HCC)  E11.69    E78.5     5. Essential hypertension  I10     6. Hypertensive retinopathy of both eyes  H35.033      1-3. Proliferative diabetic retinopathy, both eyes  - A1c 7.4 on 08.11.23  - s/p IVA OS #1 (02.22.24), #2 (03.22.24) #3 (04.19.24)  -s/p IVA OD #1 (02.23.24), #2 (03.22.24) #3 (04.19.24), #4 (05.17.24), #5 (06.18.24)  - s/p IVE OD #1 (06.18.24), #2 (07.16.24), #3 (08.13.24), #4 (09.10.24) - s/p IVE OS #1 (05.17.24), #2 (06.18.24), #3 (7.16.24), #4 (08.13.24), #5 (09.10.24) - exam shows severe edema with scattered MA and DBH, but significant lipid component to edema OU - FA (02.21.24) shows OD: Large cluster of leaking NV along IT arcades, scattered patches of vascular non-perfusion; OS: +NVD and NVE along proximal arcades - BCVA OD stable at 20/60; OS decrease at 20/200 - OCT shows OD: persistent IRF, IRHM and SRHM; mild interval improvement in IRHM OS: Mild interval improvement in diffuse edema, IRHM and SRHM, +ERM with pucker at 4 wks - good response to IVE - recommend IVE OU (OD #5 and OS #6) today, 10.08.24 w/ f/u in 4 wks - pt wishes to proceed - RBA of procedure discussed, questions answered - IVE informed consent obtained and signed, 05.17.24 (OU) - see procedure note - f/u 4 weeks -- DFE/OCT, possible injection OU  4. Hyperlipidemia  - review of labs shows severely elevated cholesterol and lipids  - 08.11.23:  Trig  785 Chol  598   LDL  410 - likely contributing to severe edema and exudates  5,6. Hypertensive retinopathy OU - discussed importance of tight BP control - continue to monitor   Ophthalmic Meds Ordered this visit:  Meds ordered this encounter  Medications   aflibercept (EYLEA) SOLN 2 mg   aflibercept (EYLEA) SOLN 2 mg     Return in about 4 weeks (around 12/28/2022) for f/u PDR OU , DFE, OCT, Possible, IVE, OU.  There are no Patient Instructions on file for this visit.  This document serves as a record of services personally performed by Karie Chimera, MD, PhD. It was created on their behalf by Laurey Morale, COT an ophthalmic technician. The creation of this record is the provider's dictation and/or activities during the visit.    Electronically signed by:  Charlette Caffey, COT  12/04/22 1:54 AM  Karie Chimera, M.D., Ph.D. Diseases & Surgery of the Retina and Vitreous [facility]  I have reviewed the above documentation for accuracy and completeness, and I agree with  the above. Karie Chimera, M.D., Ph.D. 12/04/22 1:57 AM   Abbreviations: M myopia (nearsighted); A astigmatism; H hyperopia (farsighted); P presbyopia; Mrx spectacle prescription;  CTL contact lenses; OD right eye; OS left eye; OU both eyes  XT exotropia; ET esotropia; PEK punctate epithelial keratitis; PEE punctate epithelial erosions; DES dry eye syndrome; MGD meibomian gland dysfunction; ATs artificial tears; PFAT's preservative free artificial tears; NSC nuclear sclerotic cataract; PSC posterior subcapsular cataract; ERM epi-retinal membrane; PVD posterior vitreous detachment; RD retinal detachment; DM diabetes mellitus; DR diabetic retinopathy; NPDR non-proliferative diabetic retinopathy; PDR proliferative diabetic retinopathy; CSME clinically significant macular edema; DME diabetic macular edema; dbh dot blot hemorrhages; CWS cotton wool spot; POAG primary open angle glaucoma; C/D cup-to-disc ratio;  HVF humphrey visual field; GVF goldmann visual field; OCT optical coherence tomography; IOP intraocular pressure; BRVO Branch retinal vein occlusion; CRVO central retinal vein occlusion; CRAO central retinal artery occlusion; BRAO branch retinal artery occlusion; RT retinal tear; SB scleral buckle; PPV pars plana vitrectomy; VH Vitreous hemorrhage; PRP panretinal laser photocoagulation; IVK intravitreal kenalog; VMT vitreomacular traction; MH Macular hole;  NVD neovascularization of the disc; NVE neovascularization elsewhere; AREDS age related eye disease study; ARMD age related macular degeneration; POAG primary open angle glaucoma; EBMD epithelial/anterior basement membrane dystrophy; ACIOL anterior chamber intraocular lens; IOL intraocular lens; PCIOL posterior chamber intraocular lens; Phaco/IOL phacoemulsification with intraocular lens placement; PRK photorefractive keratectomy; LASIK laser assisted in situ keratomileusis; HTN hypertension; DM diabetes mellitus; COPD chronic obstructive pulmonary disease

## 2022-11-17 ENCOUNTER — Other Ambulatory Visit: Payer: Self-pay | Admitting: *Deleted

## 2022-11-17 DIAGNOSIS — D696 Thrombocytopenia, unspecified: Secondary | ICD-10-CM

## 2022-11-17 DIAGNOSIS — E538 Deficiency of other specified B group vitamins: Secondary | ICD-10-CM

## 2022-11-18 ENCOUNTER — Inpatient Hospital Stay: Payer: 59 | Attending: Hematology

## 2022-11-18 DIAGNOSIS — E11319 Type 2 diabetes mellitus with unspecified diabetic retinopathy without macular edema: Secondary | ICD-10-CM | POA: Insufficient documentation

## 2022-11-18 DIAGNOSIS — R161 Splenomegaly, not elsewhere classified: Secondary | ICD-10-CM | POA: Insufficient documentation

## 2022-11-18 DIAGNOSIS — D696 Thrombocytopenia, unspecified: Secondary | ICD-10-CM | POA: Diagnosis not present

## 2022-11-18 DIAGNOSIS — E538 Deficiency of other specified B group vitamins: Secondary | ICD-10-CM | POA: Diagnosis not present

## 2022-11-18 LAB — CBC WITH DIFFERENTIAL/PLATELET
Abs Immature Granulocytes: 0.06 10*3/uL (ref 0.00–0.07)
Basophils Absolute: 0 10*3/uL (ref 0.0–0.1)
Basophils Relative: 1 %
Eosinophils Absolute: 0.1 10*3/uL (ref 0.0–0.5)
Eosinophils Relative: 1 %
HCT: 33.8 % — ABNORMAL LOW (ref 39.0–52.0)
Hemoglobin: 10.4 g/dL — ABNORMAL LOW (ref 13.0–17.0)
Immature Granulocytes: 1 %
Lymphocytes Relative: 24 %
Lymphs Abs: 1.4 10*3/uL (ref 0.7–4.0)
MCH: 26.7 pg (ref 26.0–34.0)
MCHC: 30.8 g/dL (ref 30.0–36.0)
MCV: 86.7 fL (ref 80.0–100.0)
Monocytes Absolute: 0.4 10*3/uL (ref 0.1–1.0)
Monocytes Relative: 6 %
Neutro Abs: 3.9 10*3/uL (ref 1.7–7.7)
Neutrophils Relative %: 67 %
Platelets: 148 10*3/uL — ABNORMAL LOW (ref 150–400)
RBC: 3.9 MIL/uL — ABNORMAL LOW (ref 4.22–5.81)
RDW: 15.5 % (ref 11.5–15.5)
WBC: 5.8 10*3/uL (ref 4.0–10.5)
nRBC: 0 % (ref 0.0–0.2)

## 2022-11-18 LAB — IRON AND TIBC
Iron: 71 ug/dL (ref 45–182)
Saturation Ratios: 15 % — ABNORMAL LOW (ref 17.9–39.5)
TIBC: 483 ug/dL — ABNORMAL HIGH (ref 250–450)
UIBC: 412 ug/dL

## 2022-11-18 LAB — FERRITIN: Ferritin: 466 ng/mL — ABNORMAL HIGH (ref 24–336)

## 2022-11-18 LAB — LACTATE DEHYDROGENASE: LDH: 125 U/L (ref 98–192)

## 2022-11-18 LAB — FOLATE: Folate: 24.6 ng/mL (ref >5.9)

## 2022-11-25 DIAGNOSIS — E1121 Type 2 diabetes mellitus with diabetic nephropathy: Secondary | ICD-10-CM | POA: Diagnosis not present

## 2022-11-25 DIAGNOSIS — Z125 Encounter for screening for malignant neoplasm of prostate: Secondary | ICD-10-CM | POA: Diagnosis not present

## 2022-11-26 ENCOUNTER — Inpatient Hospital Stay: Payer: 59 | Attending: Oncology | Admitting: Oncology

## 2022-11-26 VITALS — BP 90/58 | HR 99 | Temp 98.2°F | Resp 16 | Wt 174.8 lb

## 2022-11-26 DIAGNOSIS — E611 Iron deficiency: Secondary | ICD-10-CM

## 2022-11-26 DIAGNOSIS — E538 Deficiency of other specified B group vitamins: Secondary | ICD-10-CM | POA: Diagnosis not present

## 2022-11-26 DIAGNOSIS — R161 Splenomegaly, not elsewhere classified: Secondary | ICD-10-CM | POA: Insufficient documentation

## 2022-11-26 DIAGNOSIS — D696 Thrombocytopenia, unspecified: Secondary | ICD-10-CM

## 2022-11-26 NOTE — Progress Notes (Signed)
[facility] [address], [place] [address]   CLINIC:  Medical Oncology/Hematology  PCP:  Benita Stabile, MD [address] [place] [address]  [phone]  REASON FOR VISIT:  Follow-up for thrombocytopenia and splenomegaly  PRIOR THERAPY: none  CURRENT THERAPY: surveillance  INTERVAL HISTORY:  Jose Koch, a 52 y.o. male, returns for follow-up of thrombocytopenia and splenomegaly.    He was last seen over a year ago.  Reports no recent hospitalizations, surgeries or changes in baseline health.  Patient is followed closely by ophthalmology for newly diagnosed diabetic retinopathy in both eyes.  Feels like his vision is improving.  He has been receiving bilateral eye injections (Eylea).   Reports overall doing well.  Has an occasional cough and shortness of breath and occasional constipation d/t iron tablets.  Reports he does not sleep well at night but this is normal for him.   Denies any bleeding per rectum or melena.  Appetite is 100% energy levels are 40%.  Denies any pain.   REVIEW OF SYSTEMS:  Review of Systems  Constitutional:  Positive for fatigue.  Respiratory:  Positive for cough and shortness of breath.   Gastrointestinal:  Positive for constipation and nausea.  Neurological:  Positive for dizziness.  Psychiatric/Behavioral:  Positive for sleep disturbance.     PAST MEDICAL/SURGICAL HISTORY:  Past Medical History:  Diagnosis Date   COVID    COVID 02/2020   very sick - admitted to the hospital   Diabetes mellitus without complication (HCC)    GERD (gastroesophageal reflux disease)    Headache    High cholesterol    History of kidney stones    Hypertension    was diagnosed 2 years ago, never treated and states BP is always - documented 07/13/21   Ichthyosis vulgaris    Pneumonia    Sleep apnea    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   Past Surgical History:  Procedure Laterality Date   IR ANGIO INTRA EXTRACRAN  SEL COM CAROTID INNOMINATE BILAT MOD SED  04/17/2021   IR ANGIO INTRA EXTRACRAN SEL INTERNAL CAROTID UNI R MOD SED  07/17/2021   IR ANGIO VERTEBRAL SEL VERTEBRAL BILAT MOD SED  04/17/2021   IR CT HEAD LTD  07/15/2021   IR INTRA CRAN STENT  07/15/2021   IR RADIOLOGIST EVAL & MGMT  03/05/2021   IR RADIOLOGIST EVAL & MGMT  05/04/2021   IR RADIOLOGIST EVAL & MGMT  07/31/2021   IR US GUIDE VASC ACCESS RIGHT  04/17/2021   IR US GUIDE VASC ACCESS RIGHT  07/15/2021   RADIOLOGY WITH ANESTHESIA N/A 07/15/2021   Procedure: STENTING;  Surgeon: Julieanne Cotton, MD;  Location: [facility] OR;  Service: Radiology;  Laterality: N/A;   TONSILLECTOMY     wisdom teeth removal      SOCIAL HISTORY:  Social History   Socioeconomic History   Marital status: Significant Other    Spouse name: Not on file   Number of children: Not on file   Years of education: Not on file   Highest education level: Not on file  Occupational History   Occupation: unemployed  Tobacco Use   Smoking status: Never   Smokeless tobacco: Never  Vaping Use   Vaping status: Never Used  Substance and Sexual Activity   Alcohol use: Not Currently   Drug use: Never   Sexual activity: Not on file  Other Topics Concern   Not on  file  Social History Narrative   Right handed    Social Determinants of Health   Financial Resource Strain: Low Risk  (05/26/2020)   Overall Financial Resource Strain (CARDIA)    Difficulty of Paying Living Expenses: Not hard at all  Food Insecurity: No Food Insecurity (05/26/2020)   Hunger Vital Sign    Worried About Running Out of Food in the Last Year: Never true    Ran Out of Food in the Last Year: Never true  Transportation Needs: Unmet Transportation Needs (05/26/2020)   PRAPARE - Transportation    Lack of Transportation (Medical): Yes    Lack of Transportation (Non-Medical): Yes  Physical Activity: Sufficiently Active (05/26/2020)   Exercise Vital Sign    Days of Exercise per Week: 7 days    Minutes of Exercise  per Session: 30 min  Stress: Stress Concern Present (05/26/2020)   [employer] of Occupational Health - Occupational Stress Questionnaire    Feeling of Stress : Very much  Social Connections: Socially Isolated (05/26/2020)   Social Connection and Isolation Panel [NHANES]    Frequency of Communication with Friends and Family: More than three times a week    Frequency of Social Gatherings with Friends and Family: More than three times a week    Attends Religious Services: Never    Database administrator or Organizations: No    Attends Banker Meetings: Never    Marital Status: Divorced  Catering manager Violence: Not At Risk (05/26/2020)   Humiliation, Afraid, Rape, and Kick questionnaire    Fear of Current or Ex-Partner: No    Emotionally Abused: No    Physically Abused: No    Sexually Abused: No    FAMILY HISTORY:  Family History  Family history unknown: Yes    CURRENT MEDICATIONS:  Current Outpatient Medications  Medication Sig Dispense Refill   aspirin EC 81 MG tablet Take 81 mg by mouth daily. Swallow whole.     atorvastatin (LIPITOR) 40 MG tablet Take 40 mg by mouth daily.     blood glucose meter kit and supplies KIT Dispense based on patient and insurance preference (something with affordable test strips). Use to check glucose twice daily as directed. 1 each 0   fenofibrate 160 MG tablet Take 160 mg by mouth daily.     fluticasone (FLONASE) 50 MCG/ACT nasal spray Place 2 sprays into both nostrils daily.     folic acid (FOLVITE) 1 MG tablet TAKE 1 TABLET BY MOUTH EVERY DAY 30 tablet 0   glucose blood (ACCU-CHEK GUIDE) test strip Use as instructed to monitor glucose twice daily, before breakfast and before bed 100 each 12   Insulin Glargine (BASAGLAR KWIKPEN) 100 UNIT/ML Inject 30 Units into the skin at bedtime.     Insulin Pen Needle 31G X 5 MM MISC 1 Units by Does not apply route daily. 100 each 1   JANUVIA 100 MG tablet Take 100 mg by mouth daily.      JARDIANCE 25 MG TABS tablet Take 25 mg by mouth daily.     levETIRAcetam (KEPPRA) 500 MG tablet Take 1 tablet (500 mg total) by mouth 2 (two) times daily. 180 tablet 3   metFORMIN (GLUCOPHAGE XR) 500 MG 24 hr tablet Take 1 tablet (500 mg total) by mouth daily with breakfast. 90 tablet 0   metFORMIN (GLUCOPHAGE) 1000 MG tablet Take 1,000 mg by mouth daily.     metoCLOPramide (REGLAN) 10 MG tablet Take 10 mg by mouth 3 (three)  times daily before meals.     Misc Natural Products (OSTEO BI-FLEX ADV DOUBLE ST PO) Take 1 tablet by mouth.     OneTouch Delica Lancets 33G MISC PLEASE SEE ATTACHED FOR DETAILED DIRECTIONS     pantoprazole (PROTONIX) 40 MG tablet TAKE 1 TABLET BY MOUTH 2 TIMES DAILY BEFORE A MEAL 30 MINUTES BEFORE BREAKFAST AND DINNER 60 tablet 3   ticagrelor (BRILINTA) 90 MG TABS tablet Take 1 tablet (90 mg total) by mouth 2 (two) times daily. 60 tablet 5   No current facility-administered medications for this visit.    ALLERGIES:  No Known Allergies   PHYSICAL EXAM:  Performance status (ECOG): 0 - Asymptomatic  Vitals:   11/26/22 1236  BP: (!) 90/58  Pulse: 99  Resp: 16  Temp: 98.2 F (36.8 C)  SpO2: 98%   Wt Readings from Last 3 Encounters:  11/26/22 174 lb 13.2 oz (79.3 kg)  10/13/22 166 lb 11.2 oz (75.6 kg)  06/21/22 173 lb (78.5 kg)   Physical Exam Constitutional:      Appearance: Normal appearance.  Cardiovascular:     Rate and Rhythm: Normal rate and regular rhythm.  Pulmonary:     Effort: Pulmonary effort is normal.     Breath sounds: Normal breath sounds.  Abdominal:     General: Bowel sounds are normal.     Palpations: Abdomen is soft.  Musculoskeletal:        General: No swelling. Normal range of motion.  Neurological:     Mental Status: He is alert and oriented to person, place, and time. Mental status is at baseline.     LABORATORY DATA:  I have reviewed the labs as listed.     Latest Ref Rng & Units 11/18/2022    1:33 PM 10/06/2021    7:51 AM  07/16/2021    9:55 PM  CBC  WBC 4.0 - 10.5 K/uL 5.8  4.5  7.0   Hemoglobin 13.0 - 17.0 g/dL 23.7  62.8  31.5   Hematocrit 39.0 - 52.0 % 33.8  31.0  33.6   Platelets 150 - 400 K/uL 148  110  100       Latest Ref Rng & Units 12/16/2021    3:22 PM 07/16/2021    9:55 PM 07/16/2021    5:27 AM  CMP  Glucose 70 - 99 mg/dL  176  160   BUN 6 - 20 mg/dL  22  15   Creatinine 7.37 - 1.24 mg/dL 1.06  2.69  4.85   Sodium 135 - 145 mmol/L  137  137   Potassium 3.5 - 5.1 mmol/L  4.5  3.6   Chloride 98 - 111 mmol/L  107  105   CO2 22 - 32 mmol/L  20  23   Calcium 8.9 - 10.3 mg/dL  8.4  8.1       Component Value Date/Time   RBC 3.90 (L) 11/18/2022 1333   MCV 86.7 11/18/2022 1333   MCH 26.7 11/18/2022 1333   MCHC 30.8 11/18/2022 1333   RDW 15.5 11/18/2022 1333   LYMPHSABS 1.4 11/18/2022 1333   MONOABS 0.4 11/18/2022 1333   EOSABS 0.1 11/18/2022 1333   BASOSABS 0.0 11/18/2022 1333    DIAGNOSTIC IMAGING:  I have independently reviewed the scans and discussed with the patient. Intravitreal Injection, Pharmacologic Agent - OD - Right Eye  Result Date: 11/02/2022 Time Out 11/02/2022. 8:59 AM. Confirmed correct patient, procedure, site, and patient consented. Anesthesia Topical anesthesia was used. Anesthetic  medications included Lidocaine 2%, Proparacaine 0.5%. Procedure Preparation included 5% betadine to ocular surface, eyelid speculum. A (32g) needle was used. Injection: 2 mg aflibercept 2 MG/0.05ML   Route: Intravitreal, Site: Right Eye   NDC: L6038910, Lot: [id number], Expiration date: 01/22/2024, Waste: 0 mL Post-op Post injection exam found visual acuity of at least counting fingers. The patient tolerated the procedure well. There were no complications. The patient received written and verbal post procedure care education.   Intravitreal Injection, Pharmacologic Agent - OS - Left Eye  Result Date: 11/02/2022 Time Out 11/02/2022. 8:59 AM. Confirmed correct patient, procedure, site, and  patient consented. Anesthesia Topical anesthesia was used. Anesthetic medications included Lidocaine 2%, Proparacaine 0.5%. Procedure Preparation included 5% betadine to ocular surface, eyelid speculum. A (32g) needle was used. Injection: 2 mg aflibercept 2 MG/0.05ML   Route: Intravitreal, Site: Left Eye   NDC: L6038910, Lot: [id number], Expiration date: 01/22/2024, Waste: 0 mL Post-op Post injection exam found visual acuity of at least counting fingers. The patient tolerated the procedure well. There were no complications. The patient received written and verbal post procedure care education.   OCT, Retina - OU - Both Eyes  Result Date: 11/02/2022 Right Eye Quality was good. Central Foveal Thickness: 315. Progression has been stable. Findings include no SRF, abnormal foveal contour, subretinal hyper-reflective material, intraretinal hyper-reflective material, intraretinal fluid, vitreomacular adhesion (persistent IRF, IRHM and SRHM -- slightly improved). Left Eye Quality was good. Central Foveal Thickness: 403. Progression has improved. Findings include no SRF, abnormal foveal contour, subretinal hyper-reflective material, intraretinal hyper-reflective material, epiretinal membrane, intraretinal fluid, lamellar hole, macular pucker (Mild interval improvement in diffuse edema, IRHM and SRHM, +ERM with pucker). Notes *Images captured and stored on drive Diagnosis / Impression: OD: persistent IRF, IRHM and SRHM -- slightly improved OS: Mild interval improvement in diffuse edema, IRHM and SRHM, +ERM with pucker Clinical management: See below Abbreviations: NFP - Normal foveal profile. CME - cystoid macular edema. PED - pigment epithelial detachment. IRF - intraretinal fluid. SRF - subretinal fluid. EZ - ellipsoid zone. ERM - epiretinal membrane. ORA - outer retinal atrophy. ORT - outer retinal tubulation. SRHM - subretinal hyper-reflective material. IRHM - intraretinal hyper-reflective material      ASSESSMENT:  1.  Splenomegaly with multiple lesions: -Recent hospitalization with nausea and vomiting. -CTAP on 04/05/2020 showed enlarged spleen measuring 15 cm with multiple hypodense soft tissue density lesions measuring 2.3 cm, 2.7 cm.  No significant adenopathy.  Liver is diffusely hypodense consistent with fatty infiltration. -He had weight loss during recent hospitalization and is gaining it back. -No recurrent infections. -PET scan on 06/30/2020 images were reviewed with him. - Mild hypermetabolic small bilateral cervical lymph nodes, indeterminate.  Clinically not palpable. - No splenic hypermetabolism.  Spleen size slightly improved to 2 fingerbreadths below the left costal margin on inspiration. - EBV serology consistent with previous infection.  CMV was negative.  Other nutritional deficiency work-up for thrombocytopenia was negative. - Methylmalonic acid was slightly elevated.     2.  Social/family history: -He takes care of his girlfriend.  He worked previously as a Doctor, hospital.  He was never smoker.  Rarely drinks alcohol. -Mother was adopted.  Does not know his father.   PLAN:  1.  Splenomegaly with multiple lesions: -CTAP on 12/03/2020: Stable splenomegaly with no significant change in multiple splenic hypodensities.  No adenopathy. - He does not have any B symptoms.  Spleen tip vaguely palpable on deep inspiration. - He had a right ICA stent placement and  is currently on aspirin and Brilinta. - Lab work from 11/18/2022 shows a hemoglobin of 10.4, ferritin 466 and iron saturation 15%.  TIBC elevated at 483. He also had CKD. -He is currently on daily iron tablet 325 mg ferrous sulfate with a stool softener.  Appears to be tolerating fair with only mild constipation. -We discussed continuing iron tablets for now and rechecking labs in approximately 6 months.  2.  Mild thrombocytopenia: - Mild to moderate thrombocytopenia since 2017, likely from splenomegaly. - Current CBC  shows platelet count is 148 which is stable. -Recheck labs in 6 months.  3.  Folate deficiency: - Previously noted to have a low folate level 4.2. -He was started on 1 mg folic acid supplements each day. -Repeat labs from 11/21/2022 show folate level 24.6. -Recommend taking his folic acid every other day and we will recheck his labs in 6 months.  4. Diabetic retinopathy: -Followed by Dr. Vanessa Barbara ophthalmology and has been receiving Eylea injections to both eyes.  This appears to be improving his vision.  PLAN SUMMARY: >> Reduce folic acid to everyother day.  >> Continue iron supplement.   >> RTC in 4 months with labs a few days before and telephone visit.      Orders placed this encounter:  No orders of the defined types were placed in this encounter.   I spent 25 minutes dedicated to the care of this patient (face-to-face and non-face-to-face) on the date of the encounter to include what is described in the assessment and plan.  Durenda Hurt, NP 11/26/2022 2:23 PM

## 2022-11-30 ENCOUNTER — Ambulatory Visit (INDEPENDENT_AMBULATORY_CARE_PROVIDER_SITE_OTHER): Payer: 59 | Admitting: Ophthalmology

## 2022-11-30 ENCOUNTER — Encounter (INDEPENDENT_AMBULATORY_CARE_PROVIDER_SITE_OTHER): Payer: Self-pay | Admitting: Ophthalmology

## 2022-11-30 DIAGNOSIS — Z7984 Long term (current) use of oral hypoglycemic drugs: Secondary | ICD-10-CM | POA: Diagnosis not present

## 2022-11-30 DIAGNOSIS — I10 Essential (primary) hypertension: Secondary | ICD-10-CM

## 2022-11-30 DIAGNOSIS — H35033 Hypertensive retinopathy, bilateral: Secondary | ICD-10-CM | POA: Diagnosis not present

## 2022-11-30 DIAGNOSIS — Z794 Long term (current) use of insulin: Secondary | ICD-10-CM | POA: Diagnosis not present

## 2022-11-30 DIAGNOSIS — E113513 Type 2 diabetes mellitus with proliferative diabetic retinopathy with macular edema, bilateral: Secondary | ICD-10-CM

## 2022-11-30 DIAGNOSIS — E1169 Type 2 diabetes mellitus with other specified complication: Secondary | ICD-10-CM

## 2022-11-30 MED ORDER — AFLIBERCEPT 2MG/0.05ML IZ SOLN FOR KALEIDOSCOPE
2.0000 mg | INTRAVITREAL | Status: AC | PRN
Start: 2022-11-30 — End: 2022-11-30
  Administered 2022-11-30: 2 mg via INTRAVITREAL

## 2022-12-03 DIAGNOSIS — E1165 Type 2 diabetes mellitus with hyperglycemia: Secondary | ICD-10-CM | POA: Diagnosis not present

## 2022-12-03 DIAGNOSIS — E1143 Type 2 diabetes mellitus with diabetic autonomic (poly)neuropathy: Secondary | ICD-10-CM | POA: Diagnosis not present

## 2022-12-03 DIAGNOSIS — N1832 Chronic kidney disease, stage 3b: Secondary | ICD-10-CM | POA: Insufficient documentation

## 2022-12-03 DIAGNOSIS — I729 Aneurysm of unspecified site: Secondary | ICD-10-CM | POA: Diagnosis not present

## 2022-12-03 DIAGNOSIS — L209 Atopic dermatitis, unspecified: Secondary | ICD-10-CM | POA: Insufficient documentation

## 2022-12-03 DIAGNOSIS — E785 Hyperlipidemia, unspecified: Secondary | ICD-10-CM | POA: Diagnosis not present

## 2022-12-03 DIAGNOSIS — E1121 Type 2 diabetes mellitus with diabetic nephropathy: Secondary | ICD-10-CM | POA: Diagnosis not present

## 2022-12-03 DIAGNOSIS — I700 Atherosclerosis of aorta: Secondary | ICD-10-CM | POA: Diagnosis not present

## 2022-12-03 DIAGNOSIS — I619 Nontraumatic intracerebral hemorrhage, unspecified: Secondary | ICD-10-CM | POA: Diagnosis not present

## 2022-12-03 DIAGNOSIS — E1122 Type 2 diabetes mellitus with diabetic chronic kidney disease: Secondary | ICD-10-CM | POA: Diagnosis not present

## 2022-12-03 DIAGNOSIS — M1710 Unilateral primary osteoarthritis, unspecified knee: Secondary | ICD-10-CM | POA: Diagnosis not present

## 2022-12-03 DIAGNOSIS — E11319 Type 2 diabetes mellitus with unspecified diabetic retinopathy without macular edema: Secondary | ICD-10-CM | POA: Diagnosis not present

## 2022-12-03 DIAGNOSIS — G40109 Localization-related (focal) (partial) symptomatic epilepsy and epileptic syndromes with simple partial seizures, not intractable, without status epilepticus: Secondary | ICD-10-CM | POA: Diagnosis not present

## 2022-12-15 DIAGNOSIS — M1990 Unspecified osteoarthritis, unspecified site: Secondary | ICD-10-CM | POA: Diagnosis not present

## 2022-12-15 DIAGNOSIS — E1121 Type 2 diabetes mellitus with diabetic nephropathy: Secondary | ICD-10-CM | POA: Diagnosis not present

## 2022-12-15 DIAGNOSIS — E1151 Type 2 diabetes mellitus with diabetic peripheral angiopathy without gangrene: Secondary | ICD-10-CM | POA: Diagnosis not present

## 2022-12-15 DIAGNOSIS — Z008 Encounter for other general examination: Secondary | ICD-10-CM | POA: Diagnosis not present

## 2022-12-15 DIAGNOSIS — Z9181 History of falling: Secondary | ICD-10-CM | POA: Diagnosis not present

## 2022-12-15 DIAGNOSIS — I10 Essential (primary) hypertension: Secondary | ICD-10-CM | POA: Diagnosis not present

## 2022-12-15 DIAGNOSIS — K219 Gastro-esophageal reflux disease without esophagitis: Secondary | ICD-10-CM | POA: Diagnosis not present

## 2022-12-15 DIAGNOSIS — E1165 Type 2 diabetes mellitus with hyperglycemia: Secondary | ICD-10-CM | POA: Diagnosis not present

## 2022-12-15 DIAGNOSIS — Z809 Family history of malignant neoplasm, unspecified: Secondary | ICD-10-CM | POA: Diagnosis not present

## 2022-12-15 DIAGNOSIS — E785 Hyperlipidemia, unspecified: Secondary | ICD-10-CM | POA: Diagnosis not present

## 2022-12-15 DIAGNOSIS — I2510 Atherosclerotic heart disease of native coronary artery without angina pectoris: Secondary | ICD-10-CM | POA: Diagnosis not present

## 2022-12-15 DIAGNOSIS — Z7984 Long term (current) use of oral hypoglycemic drugs: Secondary | ICD-10-CM | POA: Diagnosis not present

## 2022-12-15 DIAGNOSIS — E1140 Type 2 diabetes mellitus with diabetic neuropathy, unspecified: Secondary | ICD-10-CM | POA: Diagnosis not present

## 2022-12-22 ENCOUNTER — Other Ambulatory Visit: Payer: Self-pay | Admitting: Hematology

## 2022-12-22 DIAGNOSIS — D649 Anemia, unspecified: Secondary | ICD-10-CM | POA: Diagnosis not present

## 2022-12-22 DIAGNOSIS — N1832 Chronic kidney disease, stage 3b: Secondary | ICD-10-CM | POA: Diagnosis not present

## 2022-12-23 DIAGNOSIS — N289 Disorder of kidney and ureter, unspecified: Secondary | ICD-10-CM | POA: Insufficient documentation

## 2022-12-30 NOTE — Progress Notes (Signed)
[facility] - Clinic Note  12/31/2022     CHIEF COMPLAINT Patient presents for Retina Follow Up   HISTORY OF PRESENT ILLNESS: Jose Koch is a 52 y.o. male who presents to the clinic today for:   HPI     Retina Follow Up   Patient presents with  Diabetic Retinopathy.  In both eyes.  Severity is moderate.  Duration of 4 weeks.  Since onset it is stable.  I, the attending physician,  performed the HPI with the patient and updated documentation appropriately.        Comments   Patient states vision improved some for watching TV. BS was 194 last pm. Patient taken off metformin 2 weeks ago, due to decrease in kidney function, creatinine levels were abnormal. Had A1c checked on 10.30.24, but results not posted yet. Patient thinks it was 6.1.       Last edited by Rennis Chris, MD on 12/31/2022  3:27 PM.     Patient states he was recently taken off metformin bc he has decreased kidney function, pt states he went for a check up with his oncologist due to an enlarged spleen, he states the swelling has gone down since his last appt  Referring physician: Benita Stabile, MD [address] [place],  [place] [address]  HISTORICAL INFORMATION:   Selected notes from the MEDICAL RECORD NUMBER Referred by Dr. Illene Labrador LEE:  Ocular Hx- PMH-    CURRENT MEDICATIONS: No current outpatient medications on file. (Ophthalmic Drugs)   No current facility-administered medications for this visit. (Ophthalmic Drugs)   Current Outpatient Medications (Other)  Medication Sig   aspirin EC 81 MG tablet Take 81 mg by mouth daily. Swallow whole.   atorvastatin (LIPITOR) 40 MG tablet Take 40 mg by mouth daily.   blood glucose meter kit and supplies KIT Dispense based on patient and insurance preference (something with affordable test strips). Use to check glucose twice daily as directed.   fenofibrate 160 MG tablet Take 160 mg by mouth daily.   fluticasone (FLONASE) 50 MCG/ACT  nasal spray Place 2 sprays into both nostrils daily.   folic acid (FOLVITE) 1 MG tablet TAKE 1 TABLET BY MOUTH EVERY DAY   glucose blood (ACCU-CHEK GUIDE) test strip Use as instructed to monitor glucose twice daily, before breakfast and before bed   Insulin Glargine (BASAGLAR KWIKPEN) 100 UNIT/ML Inject 30 Units into the skin at bedtime.   Insulin Pen Needle 31G X 5 MM MISC 1 Units by Does not apply route daily.   JANUVIA 100 MG tablet Take 100 mg by mouth daily.   JARDIANCE 25 MG TABS tablet Take 25 mg by mouth daily.   levETIRAcetam (KEPPRA) 500 MG tablet Take 1 tablet (500 mg total) by mouth 2 (two) times daily.   metFORMIN (GLUCOPHAGE XR) 500 MG 24 hr tablet Take 1 tablet (500 mg total) by mouth daily with breakfast.   metoCLOPramide (REGLAN) 10 MG tablet Take 10 mg by mouth 3 (three) times daily before meals.   Misc Natural Products (OSTEO BI-FLEX ADV DOUBLE ST PO) Take 1 tablet by mouth.   OneTouch Delica Lancets 33G MISC PLEASE SEE ATTACHED FOR DETAILED DIRECTIONS   pantoprazole (PROTONIX) 40 MG tablet TAKE 1 TABLET BY MOUTH 2 TIMES DAILY BEFORE A MEAL 30 MINUTES BEFORE BREAKFAST AND DINNER   ticagrelor (BRILINTA) 90 MG TABS tablet Take 1 tablet (90 mg total) by mouth 2 (two) times daily.   metFORMIN (GLUCOPHAGE) 1000  MG tablet Take 1,000 mg by mouth daily. (Patient not taking: Reported on 12/31/2022)   No current facility-administered medications for this visit. (Other)   REVIEW OF SYSTEMS: ROS   Positive for: Cardiovascular, Eyes, Respiratory Negative for: Constitutional, Gastrointestinal, Neurological, Skin, Genitourinary, Musculoskeletal, HENT, Endocrine, Psychiatric, Allergic/Imm, Heme/Lymph Last edited by Annalee Genta D, COT on 12/31/2022 12:16 PM.      ALLERGIES No Known Allergies  PAST MEDICAL HISTORY Past Medical History:  Diagnosis Date   COVID    COVID 02/2020   very sick - admitted to the hospital   Diabetes mellitus without complication (HCC)    GERD  (gastroesophageal reflux disease)    Headache    High cholesterol    History of kidney stones    Hypertension    was diagnosed 2 years ago, never treated and states BP is always - documented 07/13/21   Ichthyosis vulgaris    Pneumonia    Sleep apnea    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   Past Surgical History:  Procedure Laterality Date   IR ANGIO INTRA EXTRACRAN SEL COM CAROTID INNOMINATE BILAT MOD SED  04/17/2021   IR ANGIO INTRA EXTRACRAN SEL INTERNAL CAROTID UNI R MOD SED  07/17/2021   IR ANGIO VERTEBRAL SEL VERTEBRAL BILAT MOD SED  04/17/2021   IR CT HEAD LTD  07/15/2021   IR INTRA CRAN STENT  07/15/2021   IR RADIOLOGIST EVAL & MGMT  03/05/2021   IR RADIOLOGIST EVAL & MGMT  05/04/2021   IR RADIOLOGIST EVAL & MGMT  07/31/2021   IR US GUIDE VASC ACCESS RIGHT  04/17/2021   IR US GUIDE VASC ACCESS RIGHT  07/15/2021   RADIOLOGY WITH ANESTHESIA N/A 07/15/2021   Procedure: STENTING;  Surgeon: Julieanne Cotton, MD;  Location: [facility] OR;  Service: Radiology;  Laterality: N/A;   TONSILLECTOMY     wisdom teeth removal     FAMILY HISTORY Family History  Family history unknown: Yes   SOCIAL HISTORY Social History   Tobacco Use   Smoking status: Never   Smokeless tobacco: Never  Vaping Use   Vaping status: Never Used  Substance Use Topics   Alcohol use: Not Currently   Drug use: Never       OPHTHALMIC EXAM:  Base Eye Exam     Visual Acuity (Snellen - Linear)       Right Left   Dist cc 20/60 -1 20/200 -1   Dist ph cc NI NI         Tonometry (Tonopen, 12:28 PM)       Right Left   Pressure 13 13         Pupils       Dark Light Shape React APD   Right 3 2 Round Brisk None   Left 3 2 Round Brisk None         Visual Fields (Counting fingers)       Left Right    Full Full         Extraocular Movement       Right Left    Full, Ortho Full, Ortho         Neuro/Psych     Oriented x3: Yes   Mood/Affect: Normal         Dilation     Both eyes:  1.0% Mydriacyl, 2.5% Phenylephrine @ 12:28 PM           Slit Lamp and Fundus Exam     Slit  Lamp Exam       Right Left   Lids/Lashes Dermatochalasis - upper lid, Meibomian gland dysfunction, +Scurf Dermatochalasis - upper lid, Meibomian gland dysfunction, +Scurf   Conjunctiva/Sclera White and quiet White and quiet   Cornea trace PEE, mild tear film debris 1+PEE, mild tear film debris   Anterior Chamber deep and clear deep and clear   Iris Round and dilated, No NVI Round and dilated, No NVI   Lens Clear Clear   Anterior Vitreous Vitreous syneresis mild syneresis         Fundus Exam       Right Left   Disc Pink and Sharp, no heme Pink and sharp, fine NVD -- regressed, 360 exudates   C/D Ratio 0.2 0.1   Macula Blunted foveal reflex, severe edema with severe exudation -- slightly improved, scattered DBH Blunted foveal reflex, severe edema and exudation, scattered DBH -- all slightly improved   Vessels attenuated, Tortuous, +NVE greatest along inferior arcades -- improving, +Sheathing -- improving attenuated, Tortuous, +NV, +Sheathing -- improving   Periphery Attached, scattered MA/DBH, heavy exudates greatest posteriorly Attached, severe exudation posteriorly, scattered MA / DBH            IMAGING AND PROCEDURES  Imaging and Procedures for 12/31/2022  OCT, Retina - OU - Both Eyes       Right Eye Quality was good. Central Foveal Thickness: 343. Progression has been stable. Findings include no SRF, abnormal foveal contour, subretinal hyper-reflective material, intraretinal hyper-reflective material, intraretinal fluid, vitreomacular adhesion (persistent IRF, IRHM and SRHM).   Left Eye Quality was good. Central Foveal Thickness: 376. Progression has improved. Findings include no SRF, abnormal foveal contour, subretinal hyper-reflective material, intraretinal hyper-reflective material, epiretinal membrane, intraretinal fluid, lamellar hole, macular pucker (Mild interval  improvement in diffuse edema, IRHM and SRHM, +ERM with pucker).   Notes *Images captured and stored on drive  Diagnosis / Impression:  OD: persistent IRF, IRHM and SRHM OS: Mild interval improvement in diffuse edema, IRHM and SRHM, +ERM with pucker  Clinical management:  See below  Abbreviations: NFP - Normal foveal profile. CME - cystoid macular edema. PED - pigment epithelial detachment. IRF - intraretinal fluid. SRF - subretinal fluid. EZ - ellipsoid zone. ERM - epiretinal membrane. ORA - outer retinal atrophy. ORT - outer retinal tubulation. SRHM - subretinal hyper-reflective material. IRHM - intraretinal hyper-reflective material      Intravitreal Injection, Pharmacologic Agent - OD - Right Eye       Time Out 12/31/2022. 12:51 PM. Confirmed correct patient, procedure, site, and patient consented.   Anesthesia Topical anesthesia was used. Anesthetic medications included Lidocaine 2%, Proparacaine 0.5%.   Procedure Preparation included 5% betadine to ocular surface, eyelid speculum. A (32g) needle was used.   Injection: 2 mg aflibercept 2 MG/0.05ML   Route: Intravitreal, Site: Right Eye   NDC: L6038910, Lot: [id number], Expiration date: 12/23/2023, Waste: 0 mL   Post-op Post injection exam found visual acuity of at least counting fingers. The patient tolerated the procedure well. There were no complications. The patient received written and verbal post procedure care education.      Intravitreal Injection, Pharmacologic Agent - OS - Left Eye       Time Out 12/31/2022. 12:52 PM. Confirmed correct patient, procedure, site, and patient consented.   Anesthesia Topical anesthesia was used. Anesthetic medications included Lidocaine 2%, Proparacaine 0.5%.   Procedure Preparation included 5% betadine to ocular surface, eyelid speculum. A (32g) needle was used.   Injection: 2 mg  aflibercept 2 MG/0.05ML   Route: Intravitreal, Site: Left Eye   NDC: L6038910, Lot:  [id number], Expiration date: 02/22/2024, Waste: 0 mL   Post-op Post injection exam found visual acuity of at least counting fingers. The patient tolerated the procedure well. There were no complications. The patient received written and verbal post procedure care education.            ASSESSMENT/PLAN:    ICD-10-CM   1. Proliferative diabetic retinopathy of both eyes with macular edema associated with type 2 diabetes mellitus (HCC)  E11.3513 OCT, Retina - OU - Both Eyes    Intravitreal Injection, Pharmacologic Agent - OD - Right Eye    Intravitreal Injection, Pharmacologic Agent - OS - Left Eye    aflibercept (EYLEA) SOLN 2 mg    aflibercept (EYLEA) SOLN 2 mg    2. Current use of insulin (HCC)  Z79.4     3. Long term (current) use of oral hypoglycemic drugs  Z79.84     4. Hyperlipidemia associated with type 2 diabetes mellitus (HCC)  E11.69    E78.5     5. Essential hypertension  I10     6. Hypertensive retinopathy of both eyes  H35.033      1-3. Proliferative diabetic retinopathy, both eyes  - A1c 7.4 on 08.11.23  - s/p IVA OS #1 (02.22.24), #2 (03.22.24) #3 (04.19.24)  -s/p IVA OD #1 (02.23.24), #2 (03.22.24) #3 (04.19.24), #4 (05.17.24), #5 (06.18.24)  - s/p IVE OD #1 (06.18.24), #2 (07.16.24), #3 (08.13.24), #4 (09.10.24), #5 (10.08.24) - s/p IVE OS #1 (05.17.24), #2 (06.18.24), #3 (7.16.24), #4 (08.13.24), #5 (09.10.24), #6 (10.08.24) - exam shows severe edema with scattered MA and DBH, but significant lipid component to edema OU - FA (02.21.24) shows OD: Large cluster of leaking NV along IT arcades, scattered patches of vascular non-perfusion; OS: +NVD and NVE along proximal arcades - BCVA OD stable at 20/60; OS decrease at 20/200 - OCT shows OD: persistent IRF, IRHM and SRHM; mild interval improvement in IRHM; OS: Mild interval improvement in diffuse edema, IRHM and SRHM, +ERM with pucker at 4 wks **discussed decreased efficacy / resistance to Eylea and potential  benefit of switching medication* - recommend IVE OU (OD #6 and OS #7) today, 11.08.24 w/ f/u in 4 wks - pt wishes to proceed - RBA of procedure discussed, questions answered - IVE informed consent obtained and signed, 05.17.24 (OU) - see procedure note - will check V[employer] auth - f/u 4 weeks -- DFE/OCT, possible injection OU  4. Hyperlipidemia  - review of labs shows severely elevated cholesterol and lipids  - 08.11.23:  Trig  785 Chol  598  LDL  410 - likely contributing to severe edema and exudates  5,6. Hypertensive retinopathy OU - discussed importance of tight BP control - continue to monitor   Ophthalmic Meds Ordered this visit:  Meds ordered this encounter  Medications   aflibercept (EYLEA) SOLN 2 mg   aflibercept (EYLEA) SOLN 2 mg     Return in about 4 weeks (around 01/28/2023) for f/u PDR OU, DFE, OCT.  There are no Patient Instructions on file for this visit.  This document serves as a record of services personally performed by Karie Chimera, MD, PhD. It was created on their behalf by Berlin Hun COT, an ophthalmic technician. The creation of this record is the provider's dictation and/or activities during the visit.    Electronically signed by: Berlin Hun COT 11.07.24 1:55 AM  This document serves as  a record of services personally performed by Karie Chimera, MD, PhD. It was created on their behalf by Glee Arvin. Manson Passey, OA an ophthalmic technician. The creation of this record is the provider's dictation and/or activities during the visit.    Electronically signed by: Glee Arvin. Manson Passey, OA 01/02/23 1:55 AM  Karie Chimera, M.D., Ph.D. Diseases & Surgery of the Retina and Vitreous [facility] 12/31/2022   I have reviewed the above documentation for accuracy and completeness, and I agree with the above. Karie Chimera, M.D., Ph.D. 01/02/23 1:57 AM  Abbreviations: M myopia (nearsighted); A astigmatism; H hyperopia  (farsighted); P presbyopia; Mrx spectacle prescription;  CTL contact lenses; OD right eye; OS left eye; OU both eyes  XT exotropia; ET esotropia; PEK punctate epithelial keratitis; PEE punctate epithelial erosions; DES dry eye syndrome; MGD meibomian gland dysfunction; ATs artificial tears; PFAT's preservative free artificial tears; NSC nuclear sclerotic cataract; PSC posterior subcapsular cataract; ERM epi-retinal membrane; PVD posterior vitreous detachment; RD retinal detachment; DM diabetes mellitus; DR diabetic retinopathy; NPDR non-proliferative diabetic retinopathy; PDR proliferative diabetic retinopathy; CSME clinically significant macular edema; DME diabetic macular edema; dbh dot blot hemorrhages; CWS cotton wool spot; POAG primary open angle glaucoma; C/D cup-to-disc ratio; HVF humphrey visual field; GVF goldmann visual field; OCT optical coherence tomography; IOP intraocular pressure; BRVO Branch retinal vein occlusion; CRVO central retinal vein occlusion; CRAO central retinal artery occlusion; BRAO branch retinal artery occlusion; RT retinal tear; SB scleral buckle; PPV pars plana vitrectomy; VH Vitreous hemorrhage; PRP panretinal laser photocoagulation; IVK intravitreal kenalog; VMT vitreomacular traction; MH Macular hole;  NVD neovascularization of the disc; NVE neovascularization elsewhere; AREDS age related eye disease study; ARMD age related macular degeneration; POAG primary open angle glaucoma; EBMD epithelial/anterior basement membrane dystrophy; ACIOL anterior chamber intraocular lens; IOL intraocular lens; PCIOL posterior chamber intraocular lens; Phaco/IOL phacoemulsification with intraocular lens placement; PRK photorefractive keratectomy; LASIK laser assisted in situ keratomileusis; HTN hypertension; DM diabetes mellitus; COPD chronic obstructive pulmonary disease

## 2022-12-31 ENCOUNTER — Encounter (INDEPENDENT_AMBULATORY_CARE_PROVIDER_SITE_OTHER): Payer: Self-pay | Admitting: Ophthalmology

## 2022-12-31 ENCOUNTER — Ambulatory Visit (INDEPENDENT_AMBULATORY_CARE_PROVIDER_SITE_OTHER): Payer: 59 | Admitting: Ophthalmology

## 2022-12-31 DIAGNOSIS — E113513 Type 2 diabetes mellitus with proliferative diabetic retinopathy with macular edema, bilateral: Secondary | ICD-10-CM | POA: Diagnosis not present

## 2022-12-31 DIAGNOSIS — I10 Essential (primary) hypertension: Secondary | ICD-10-CM

## 2022-12-31 DIAGNOSIS — H35033 Hypertensive retinopathy, bilateral: Secondary | ICD-10-CM | POA: Diagnosis not present

## 2022-12-31 DIAGNOSIS — Z794 Long term (current) use of insulin: Secondary | ICD-10-CM

## 2022-12-31 DIAGNOSIS — Z7984 Long term (current) use of oral hypoglycemic drugs: Secondary | ICD-10-CM

## 2022-12-31 DIAGNOSIS — E1169 Type 2 diabetes mellitus with other specified complication: Secondary | ICD-10-CM

## 2022-12-31 MED ORDER — AFLIBERCEPT 2MG/0.05ML IZ SOLN FOR KALEIDOSCOPE
2.0000 mg | INTRAVITREAL | Status: AC | PRN
  Administered 2022-12-31: 2 mg via INTRAVITREAL

## 2022-12-31 MED ORDER — AFLIBERCEPT 2MG/0.05ML IZ SOLN FOR KALEIDOSCOPE
2.0000 mg | INTRAVITREAL | Status: AC | PRN
Start: 2022-12-31 — End: 2022-12-31
  Administered 2022-12-31: 2 mg via INTRAVITREAL

## 2023-01-04 ENCOUNTER — Other Ambulatory Visit: Payer: Self-pay | Admitting: Physician Assistant

## 2023-01-04 MED ORDER — TICAGRELOR 90 MG PO TABS: 90.0000 mg | ORAL_TABLET | Freq: Two times a day (BID) | ORAL | 5 refills | Status: DC

## 2023-01-13 DIAGNOSIS — R8290 Unspecified abnormal findings in urine: Secondary | ICD-10-CM | POA: Diagnosis not present

## 2023-01-13 DIAGNOSIS — N1831 Chronic kidney disease, stage 3a: Secondary | ICD-10-CM | POA: Diagnosis not present

## 2023-01-13 DIAGNOSIS — N170 Acute kidney failure with tubular necrosis: Secondary | ICD-10-CM | POA: Diagnosis not present

## 2023-01-13 DIAGNOSIS — D631 Anemia in chronic kidney disease: Secondary | ICD-10-CM | POA: Diagnosis not present

## 2023-01-13 DIAGNOSIS — R809 Proteinuria, unspecified: Secondary | ICD-10-CM | POA: Diagnosis not present

## 2023-01-14 ENCOUNTER — Other Ambulatory Visit (HOSPITAL_COMMUNITY): Payer: Self-pay | Admitting: Nephrology

## 2023-01-14 DIAGNOSIS — N1831 Chronic kidney disease, stage 3a: Secondary | ICD-10-CM

## 2023-01-14 DIAGNOSIS — N170 Acute kidney failure with tubular necrosis: Secondary | ICD-10-CM

## 2023-01-14 DIAGNOSIS — R809 Proteinuria, unspecified: Secondary | ICD-10-CM

## 2023-01-24 ENCOUNTER — Ambulatory Visit (HOSPITAL_COMMUNITY)
Admission: RE | Admit: 2023-01-24 | Discharge: 2023-01-24 | Disposition: A | Discharge status: Home / Self Care | Payer: 59 | Source: Ambulatory Visit | Attending: Nephrology | Admitting: Nephrology

## 2023-01-24 DIAGNOSIS — N1831 Chronic kidney disease, stage 3a: Secondary | ICD-10-CM | POA: Diagnosis not present

## 2023-01-24 DIAGNOSIS — N170 Acute kidney failure with tubular necrosis: Secondary | ICD-10-CM | POA: Diagnosis not present

## 2023-01-24 DIAGNOSIS — R809 Proteinuria, unspecified: Secondary | ICD-10-CM | POA: Insufficient documentation

## 2023-01-24 DIAGNOSIS — N189 Chronic kidney disease, unspecified: Secondary | ICD-10-CM | POA: Diagnosis not present

## 2023-01-27 NOTE — Progress Notes (Addendum)
[facility] - Clinic Note  01/28/2023     CHIEF COMPLAINT Patient presents for Retina Follow Up   HISTORY OF PRESENT ILLNESS: Jose Koch is a 52 y.o. male who presents to the clinic today for:   HPI     Retina Follow Up   Patient presents with  Diabetic Retinopathy.  In both eyes.  Severity is moderate.  Duration of 4 weeks.  Since onset it is stable.  I, the attending physician,  performed the HPI with the patient and updated documentation appropriately.        Comments   Patient states that the cholesterol in his eyes has moved and hoping he can see better. He is nos using eye drops. His blood sugar was 210.      Last edited by Rennis Chris, MD on 01/28/2023  4:43 PM.    Patient states vision is improved  Referring physician: Benita Stabile, MD [address] [place],  [place] [address]  HISTORICAL INFORMATION:   Selected notes from the MEDICAL RECORD NUMBER Referred by Dr. Illene Labrador LEE:  Ocular Hx- PMH-    CURRENT MEDICATIONS: No current outpatient medications on file. (Ophthalmic Drugs)   No current facility-administered medications for this visit. (Ophthalmic Drugs)   Current Outpatient Medications (Other)  Medication Sig   aspirin EC 81 MG tablet Take 81 mg by mouth daily. Swallow whole.   atorvastatin (LIPITOR) 40 MG tablet Take 40 mg by mouth daily.   blood glucose meter kit and supplies KIT Dispense based on patient and insurance preference (something with affordable test strips). Use to check glucose twice daily as directed.   fenofibrate 160 MG tablet Take 160 mg by mouth daily.   fluticasone (FLONASE) 50 MCG/ACT nasal spray Place 2 sprays into both nostrils daily.   folic acid (FOLVITE) 1 MG tablet TAKE 1 TABLET BY MOUTH EVERY DAY   glucose blood (ACCU-CHEK GUIDE) test strip Use as instructed to monitor glucose twice daily, before breakfast and before bed   Insulin Glargine (BASAGLAR KWIKPEN) 100 UNIT/ML Inject 30 Units  into the skin at bedtime.   Insulin Pen Needle 31G X 5 MM MISC 1 Units by Does not apply route daily.   JANUVIA 100 MG tablet Take 100 mg by mouth daily.   JARDIANCE 25 MG TABS tablet Take 25 mg by mouth daily.   levETIRAcetam (KEPPRA) 500 MG tablet Take 1 tablet (500 mg total) by mouth 2 (two) times daily.   metFORMIN (GLUCOPHAGE XR) 500 MG 24 hr tablet Take 1 tablet (500 mg total) by mouth daily with breakfast.   metFORMIN (GLUCOPHAGE) 1000 MG tablet Take 1,000 mg by mouth daily.   metoCLOPramide (REGLAN) 10 MG tablet Take 10 mg by mouth 3 (three) times daily before meals.   Misc Natural Products (OSTEO BI-FLEX ADV DOUBLE ST PO) Take 1 tablet by mouth.   OneTouch Delica Lancets 33G MISC PLEASE SEE ATTACHED FOR DETAILED DIRECTIONS   pantoprazole (PROTONIX) 40 MG tablet TAKE 1 TABLET BY MOUTH 2 TIMES DAILY BEFORE A MEAL 30 MINUTES BEFORE BREAKFAST AND DINNER   ticagrelor (BRILINTA) 90 MG TABS tablet Take 1 tablet (90 mg total) by mouth 2 (two) times daily.   No current facility-administered medications for this visit. (Other)   REVIEW OF SYSTEMS: ROS   Positive for: Cardiovascular, Eyes, Respiratory Negative for: Constitutional, Gastrointestinal, Neurological, Skin, Genitourinary, Musculoskeletal, HENT, Endocrine, Psychiatric, Allergic/Imm, Heme/Lymph Last edited by Charlette Caffey, COT on 01/28/2023  1:35 PM.  ALLERGIES No Known Allergies  PAST MEDICAL HISTORY Past Medical History:  Diagnosis Date   COVID    COVID 02/2020   very sick - admitted to the hospital   Diabetes mellitus without complication (HCC)    GERD (gastroesophageal reflux disease)    Headache    High cholesterol    History of kidney stones    Hypertension    was diagnosed 2 years ago, never treated and states BP is always - documented 07/13/21   Ichthyosis vulgaris    Pneumonia    Sleep apnea    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   Past Surgical History:  Procedure Laterality Date   IR ANGIO  INTRA EXTRACRAN SEL COM CAROTID INNOMINATE BILAT MOD SED  04/17/2021   IR ANGIO INTRA EXTRACRAN SEL INTERNAL CAROTID UNI R MOD SED  07/17/2021   IR ANGIO VERTEBRAL SEL VERTEBRAL BILAT MOD SED  04/17/2021   IR CT HEAD LTD  07/15/2021   IR INTRA CRAN STENT  07/15/2021   IR RADIOLOGIST EVAL & MGMT  03/05/2021   IR RADIOLOGIST EVAL & MGMT  05/04/2021   IR RADIOLOGIST EVAL & MGMT  07/31/2021   IR US GUIDE VASC ACCESS RIGHT  04/17/2021   IR US GUIDE VASC ACCESS RIGHT  07/15/2021   RADIOLOGY WITH ANESTHESIA N/A 07/15/2021   Procedure: STENTING;  Surgeon: Julieanne Cotton, MD;  Location: [facility] OR;  Service: Radiology;  Laterality: N/A;   TONSILLECTOMY     wisdom teeth removal     FAMILY HISTORY Family History  Family history unknown: Yes   SOCIAL HISTORY Social History   Tobacco Use   Smoking status: Never   Smokeless tobacco: Never  Vaping Use   Vaping status: Never Used  Substance Use Topics   Alcohol use: Not Currently   Drug use: Never       OPHTHALMIC EXAM:  Base Eye Exam     Visual Acuity (Snellen - Linear)       Right Left   Dist cc 20/50 +1 20/200   Dist ph cc NI 20/150 +2    Correction: Glasses         Tonometry (Tonopen, 1:41 PM)       Right Left   Pressure 13 15         Pupils       Dark Light Shape React APD   Right 3 2 Round Brisk None   Left 3 2 Round Brisk None         Visual Fields       Left Right    Full Full         Extraocular Movement       Right Left    Full, Ortho Full, Ortho         Neuro/Psych     Oriented x3: Yes   Mood/Affect: Normal         Dilation     Both eyes: 1.0% Mydriacyl, 2.5% Phenylephrine @ 1:36 PM           Slit Lamp and Fundus Exam     Slit Lamp Exam       Right Left   Lids/Lashes Dermatochalasis - upper lid, Meibomian gland dysfunction, +Scurf Dermatochalasis - upper lid, Meibomian gland dysfunction, +Scurf   Conjunctiva/Sclera White and quiet White and quiet   Cornea trace PEE, mild tear film  debris 1+PEE, mild tear film debris   Anterior Chamber deep and clear deep and clear  Iris Round and dilated, No NVI Round and dilated, No NVI   Lens Clear Clear   Anterior Vitreous Vitreous syneresis mild syneresis         Fundus Exam       Right Left   Disc Pink and Sharp, no heme Pink and sharp, fine NVD -- regressed, 360 exudates   C/D Ratio 0.2 0.1   Macula Blunted foveal reflex, severe edema with severe exudation -- slightly improved, scattered DBH -- improved Blunted foveal reflex, severe edema and exudation, scattered DBH -- all slightly improved   Vessels attenuated, Tortuous, +NVE greatest along inferior arcades -- improving, +Sheathing -- improving attenuated, Tortuous, +NV, +Sheathing -- improving   Periphery Attached, scattered MA/DBH, heavy exudates greatest posteriorly Attached, severe exudation posteriorly, scattered MA / DBH           Refraction     Wearing Rx       Sphere Cylinder Axis Add   Right -2.50 +1.00 049 +1.00   Left -0.75 +1.25 178 +1.00            IMAGING AND PROCEDURES  Imaging and Procedures for 01/28/2023  OCT, Retina - OU - Both Eyes       Right Eye Quality was good. Central Foveal Thickness: 360. Progression has improved. Findings include no SRF, abnormal foveal contour, subretinal hyper-reflective material, intraretinal hyper-reflective material, intraretinal fluid, vitreomacular adhesion (persistent IRF, IRHM and SRHM -- slightly improved).   Left Eye Quality was good. Central Foveal Thickness: 389. Progression has been stable. Findings include no SRF, abnormal foveal contour, subretinal hyper-reflective material, intraretinal hyper-reflective material, epiretinal membrane, intraretinal fluid, lamellar hole, macular pucker (Persistent edema, IRHM and SRHM, +ERM with pucker).   Notes *Images captured and stored on drive  Diagnosis / Impression:  OD: persistent IRF, IRHM and SRHM -- slightly improved OS: persistent edema, IRHM  and SRHM, +ERM with pucker  Clinical management:  See below  Abbreviations: NFP - Normal foveal profile. CME - cystoid macular edema. PED - pigment epithelial detachment. IRF - intraretinal fluid. SRF - subretinal fluid. EZ - ellipsoid zone. ERM - epiretinal membrane. ORA - outer retinal atrophy. ORT - outer retinal tubulation. SRHM - subretinal hyper-reflective material. IRHM - intraretinal hyper-reflective material      Intravitreal Injection, Pharmacologic Agent - OD - Right Eye       Time Out 01/28/2023. 2:28 PM. Confirmed correct patient, procedure, site, and patient consented.   Anesthesia Topical anesthesia was used. Anesthetic medications included Lidocaine 2%, Proparacaine 0.5%.   Procedure Preparation included 5% betadine to ocular surface, eyelid speculum. A (32g) needle was used.   Injection: 2 mg aflibercept 2 MG/0.05ML   Route: Intravitreal, Site: Right Eye   NDC: L6038910, Lot: [id number], Expiration date: 07/23/2023, Waste: 0 mL   Post-op Post injection exam found visual acuity of at least counting fingers. The patient tolerated the procedure well. There were no complications. The patient received written and verbal post procedure care education.      Intravitreal Injection, Pharmacologic Agent - OS - Left Eye       Time Out 01/28/2023. 2:28 PM. Confirmed correct patient, procedure, site, and patient consented.   Anesthesia Topical anesthesia was used. Anesthetic medications included Lidocaine 2%, Proparacaine 0.5%.   Procedure Preparation included 5% betadine to ocular surface, eyelid speculum. A (32g) needle was used.   Injection: 2 mg aflibercept 2 MG/0.05ML   Route: Intravitreal, Site: Left Eye   NDC: L6038910, Lot: [id number], Expiration date: 02/22/2024, Waste: 0 mL  Post-op Post injection exam found visual acuity of at least counting fingers. The patient tolerated the procedure well. There were no complications. The patient received  written and verbal post procedure care education.            ASSESSMENT/PLAN:    ICD-10-CM   1. Proliferative diabetic retinopathy of both eyes with macular edema associated with type 2 diabetes mellitus (HCC)  E11.3513 OCT, Retina - OU - Both Eyes    Intravitreal Injection, Pharmacologic Agent - OD - Right Eye    Intravitreal Injection, Pharmacologic Agent - OS - Left Eye    aflibercept (EYLEA) SOLN 2 mg    aflibercept (EYLEA) SOLN 2 mg    2. Current use of insulin (HCC)  Z79.4     3. Long term (current) use of oral hypoglycemic drugs  Z79.84     4. Hyperlipidemia associated with type 2 diabetes mellitus (HCC)  E11.69    E78.5     5. Essential hypertension  I10     6. Hypertensive retinopathy of both eyes  H35.033      1-3. Proliferative diabetic retinopathy, both eyes  - A1c 7.4 on 08.11.23  - s/p IVA OS #1 (02.22.24), #2 (03.22.24) #3 (04.19.24)  -s/p IVA OD #1 (02.23.24), #2 (03.22.24) #3 (04.19.24), #4 (05.17.24), #5 (06.18.24) #6(11.08.24)  - s/p IVE OD #1 (06.18.24), #2 (07.16.24), #3 (08.13.24), #4 (09.10.24), #5 (10.08.24) - s/p IVE OS #1 (05.17.24), #2 (06.18.24), #3 (7.16.24), #4 (08.13.24), #5 (09.10.24), #6 (10.08.24) #7(11.08.24) - exam shows severe edema with scattered MA and DBH, but significant lipid component to edema OU - FA (02.21.24) shows OD: Large cluster of leaking NV along IT arcades, scattered patches of vascular non-perfusion; OS: +NVD and NVE along proximal arcades - BCVA OD improved to 20/50 from 20/60; OS improved to 20/150 from 20/200 - OCT shows OD: persistent IRF, IRHM and SRHM -- slightly improved; OS: persistent edema, IRHM and SRHM, +ERM with pucker at 4 wks - recommend IVE OU (OD #7 and OS #8) today, 12.06.24 w/ f/u in 4 wks - pt wishes to proceed - RBA of procedure discussed, questions answered - IVE informed consent obtained and signed, 05.17.24 (OU) - see procedure note - Vabysmo approved for 2024 - f/u 4 weeks -- DFE/OCT, possible  injection OU  4. Hyperlipidemia  - review of labs shows severely elevated cholesterol and lipids  - 08.11.23:  Trig  785 Chol  598  LDL  410 - likely contributing to severe edema and exudates  5,6. Hypertensive retinopathy OU - discussed importance of tight BP control - continue to monitor   Ophthalmic Meds Ordered this visit:  Meds ordered this encounter  Medications   aflibercept (EYLEA) SOLN 2 mg   aflibercept (EYLEA) SOLN 2 mg     Return in about 4 weeks (around 02/25/2023) for f/u PDR OU, DFE, OCT, Possible Injxn.  There are no Patient Instructions on file for this visit.  This document serves as a record of services personally performed by Karie Chimera, MD, PhD. It was created on their behalf by Berlin Hun COT, an ophthalmic technician. The creation of this record is the provider's dictation and/or activities during the visit.    Electronically signed by: Berlin Hun COT 12.05.245:32 PM  This document serves as a record of services personally performed by Karie Chimera, MD, PhD. It was created on their behalf by Glee Arvin. Manson Passey, OA an ophthalmic technician. The creation of this record is the provider's dictation and/or  activities during the visit.    Electronically signed by: Glee Arvin. Manson Passey, OA 01/28/23 5:32 PM  Karie Chimera, M.D., Ph.D. Diseases & Surgery of the Retina and Vitreous [facility] 01/28/2023   I have reviewed the above documentation for accuracy and completeness, and I agree with the above. Karie Chimera, M.D., Ph.D. 01/28/23 5:32 PM   Abbreviations: M myopia (nearsighted); A astigmatism; H hyperopia (farsighted); P presbyopia; Mrx spectacle prescription;  CTL contact lenses; OD right eye; OS left eye; OU both eyes  XT exotropia; ET esotropia; PEK punctate epithelial keratitis; PEE punctate epithelial erosions; DES dry eye syndrome; MGD meibomian gland dysfunction; ATs artificial tears; PFAT's preservative  free artificial tears; NSC nuclear sclerotic cataract; PSC posterior subcapsular cataract; ERM epi-retinal membrane; PVD posterior vitreous detachment; RD retinal detachment; DM diabetes mellitus; DR diabetic retinopathy; NPDR non-proliferative diabetic retinopathy; PDR proliferative diabetic retinopathy; CSME clinically significant macular edema; DME diabetic macular edema; dbh dot blot hemorrhages; CWS cotton wool spot; POAG primary open angle glaucoma; C/D cup-to-disc ratio; HVF humphrey visual field; GVF goldmann visual field; OCT optical coherence tomography; IOP intraocular pressure; BRVO Branch retinal vein occlusion; CRVO central retinal vein occlusion; CRAO central retinal artery occlusion; BRAO branch retinal artery occlusion; RT retinal tear; SB scleral buckle; PPV pars plana vitrectomy; VH Vitreous hemorrhage; PRP panretinal laser photocoagulation; IVK intravitreal kenalog; VMT vitreomacular traction; MH Macular hole;  NVD neovascularization of the disc; NVE neovascularization elsewhere; AREDS age related eye disease study; ARMD age related macular degeneration; POAG primary open angle glaucoma; EBMD epithelial/anterior basement membrane dystrophy; ACIOL anterior chamber intraocular lens; IOL intraocular lens; PCIOL posterior chamber intraocular lens; Phaco/IOL phacoemulsification with intraocular lens placement; PRK photorefractive keratectomy; LASIK laser assisted in situ keratomileusis; HTN hypertension; DM diabetes mellitus; COPD chronic obstructive pulmonary disease

## 2023-01-28 ENCOUNTER — Ambulatory Visit (INDEPENDENT_AMBULATORY_CARE_PROVIDER_SITE_OTHER): Payer: 59 | Admitting: Ophthalmology

## 2023-01-28 ENCOUNTER — Encounter (INDEPENDENT_AMBULATORY_CARE_PROVIDER_SITE_OTHER): Payer: Self-pay | Admitting: Ophthalmology

## 2023-01-28 DIAGNOSIS — Z7984 Long term (current) use of oral hypoglycemic drugs: Secondary | ICD-10-CM | POA: Diagnosis not present

## 2023-01-28 DIAGNOSIS — I10 Essential (primary) hypertension: Secondary | ICD-10-CM

## 2023-01-28 DIAGNOSIS — Z794 Long term (current) use of insulin: Secondary | ICD-10-CM | POA: Diagnosis not present

## 2023-01-28 DIAGNOSIS — H35033 Hypertensive retinopathy, bilateral: Secondary | ICD-10-CM

## 2023-01-28 DIAGNOSIS — E113513 Type 2 diabetes mellitus with proliferative diabetic retinopathy with macular edema, bilateral: Secondary | ICD-10-CM | POA: Diagnosis not present

## 2023-01-28 DIAGNOSIS — E1169 Type 2 diabetes mellitus with other specified complication: Secondary | ICD-10-CM

## 2023-01-28 MED ORDER — AFLIBERCEPT 2MG/0.05ML IZ SOLN FOR KALEIDOSCOPE
2.0000 mg | INTRAVITREAL | Status: AC | PRN
  Administered 2023-01-28: 2 mg via INTRAVITREAL

## 2023-02-03 DIAGNOSIS — N170 Acute kidney failure with tubular necrosis: Secondary | ICD-10-CM | POA: Diagnosis not present

## 2023-02-03 DIAGNOSIS — E1122 Type 2 diabetes mellitus with diabetic chronic kidney disease: Secondary | ICD-10-CM | POA: Diagnosis not present

## 2023-02-03 DIAGNOSIS — N189 Chronic kidney disease, unspecified: Secondary | ICD-10-CM | POA: Diagnosis not present

## 2023-02-03 DIAGNOSIS — N1832 Chronic kidney disease, stage 3b: Secondary | ICD-10-CM | POA: Diagnosis not present

## 2023-02-14 ENCOUNTER — Encounter (INDEPENDENT_AMBULATORY_CARE_PROVIDER_SITE_OTHER): Payer: Self-pay | Admitting: Ophthalmology

## 2023-02-24 NOTE — Progress Notes (Signed)
 [facility] - Clinic Note  02/25/2023     CHIEF COMPLAINT Patient presents for Retina Follow Up   HISTORY OF PRESENT ILLNESS: Jose Koch is a 53 y.o. male who presents to the clinic today for:   HPI     Retina Follow Up   Patient presents with  Diabetic Retinopathy.  In both eyes.  Severity is moderate.  Duration of 4 weeks.  Since onset it is stable.  I, the attending physician,  performed the HPI with the patient and updated documentation appropriately.        Comments   Patient states that he is able to read the TV a little better. He is not using eye drops. His blood sugar was 204.      Last edited by Joshua Zeringue, MD on 02/25/2023  4:13 PM.    Patient states he can see the TV better  Referring physician: Shona Norleen PEDLAR, MD [address] [name],  [place] [address]  HISTORICAL INFORMATION:   Selected notes from the MEDICAL RECORD NUMBER Referred by Dr. Nanetta Sharps LEE:  Ocular Hx- PMH-    CURRENT MEDICATIONS: No current outpatient medications on file. (Ophthalmic Drugs)   No current facility-administered medications for this visit. (Ophthalmic Drugs)   Current Outpatient Medications (Other)  Medication Sig   aspirin  EC 81 MG tablet Take 81 mg by mouth daily. Swallow whole.   atorvastatin  (LIPITOR) 40 MG tablet Take 40 mg by mouth daily.   blood glucose meter kit and supplies KIT Dispense based on patient and insurance preference (something with affordable test strips). Use to check glucose twice daily as directed.   fenofibrate  160 MG tablet Take 160 mg by mouth daily.   fluticasone (FLONASE) 50 MCG/ACT nasal spray Place 2 sprays into both nostrils daily.   folic acid  (FOLVITE ) 1 MG tablet TAKE 1 TABLET BY MOUTH EVERY DAY   glucose blood (ACCU-CHEK GUIDE) test strip Use as instructed to monitor glucose twice daily, before breakfast and before bed   Insulin  Glargine (BASAGLAR  KWIKPEN) 100 UNIT/ML Inject 30 Units into the skin at  bedtime.   Insulin  Pen Needle 31G X 5 MM MISC 1 Units by Does not apply route daily.   JANUVIA  100 MG tablet Take 100 mg by mouth daily.   JARDIANCE  25 MG TABS tablet Take 25 mg by mouth daily.   levETIRAcetam  (KEPPRA ) 500 MG tablet Take 1 tablet (500 mg total) by mouth 2 (two) times daily.   metFORMIN  (GLUCOPHAGE  XR) 500 MG 24 hr tablet Take 1 tablet (500 mg total) by mouth daily with breakfast.   metFORMIN  (GLUCOPHAGE ) 1000 MG tablet Take 1,000 mg by mouth daily.   metoCLOPramide  (REGLAN ) 10 MG tablet Take 10 mg by mouth 3 (three) times daily before meals.   Misc Natural Products (OSTEO BI-FLEX ADV DOUBLE ST PO) Take 1 tablet by mouth.   OneTouch Delica Lancets 33G MISC PLEASE SEE ATTACHED FOR DETAILED DIRECTIONS   pantoprazole  (PROTONIX ) 40 MG tablet TAKE 1 TABLET BY MOUTH 2 TIMES DAILY BEFORE A MEAL 30 MINUTES BEFORE BREAKFAST AND DINNER   ticagrelor  (BRILINTA ) 90 MG TABS tablet Take 1 tablet (90 mg total) by mouth 2 (two) times daily.   No current facility-administered medications for this visit. (Other)   REVIEW OF SYSTEMS: ROS   Positive for: Cardiovascular, Eyes, Respiratory Negative for: Constitutional, Gastrointestinal, Neurological, Skin, Genitourinary, Musculoskeletal, HENT, Endocrine, Psychiatric, Allergic/Imm, Heme/Lymph Last edited by Myra Wanda SAILOR, COT on 02/25/2023  1:26 PM.  ALLERGIES No Known Allergies  PAST MEDICAL HISTORY Past Medical History:  Diagnosis Date   COVID    COVID 02/2020   very sick - admitted to the hospital   Diabetes mellitus without complication (HCC)    GERD (gastroesophageal reflux disease)    Headache    High cholesterol    History of kidney stones    Hypertension    was diagnosed 2 years ago, never treated and states BP is always - documented 07/13/21   Ichthyosis vulgaris    Pneumonia    Sleep apnea    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   Past Surgical History:  Procedure Laterality Date   IR ANGIO INTRA EXTRACRAN  SEL COM CAROTID INNOMINATE BILAT MOD SED  04/17/2021   IR ANGIO INTRA EXTRACRAN SEL INTERNAL CAROTID UNI R MOD SED  07/17/2021   IR ANGIO VERTEBRAL SEL VERTEBRAL BILAT MOD SED  04/17/2021   IR CT HEAD LTD  07/15/2021   IR INTRA CRAN STENT  07/15/2021   IR RADIOLOGIST EVAL & MGMT  03/05/2021   IR RADIOLOGIST EVAL & MGMT  05/04/2021   IR RADIOLOGIST EVAL & MGMT  07/31/2021   IR US  GUIDE VASC ACCESS RIGHT  04/17/2021   IR US  GUIDE VASC ACCESS RIGHT  07/15/2021   RADIOLOGY WITH ANESTHESIA N/A 07/15/2021   Procedure: STENTING;  Surgeon: Dolphus Carrion, MD;  Location: [facility] OR;  Service: Radiology;  Laterality: N/A;   TONSILLECTOMY     wisdom teeth removal     FAMILY HISTORY Family History  Family history unknown: Yes   SOCIAL HISTORY Social History   Tobacco Use   Smoking status: Never   Smokeless tobacco: Never  Vaping Use   Vaping status: Never Used  Substance Use Topics   Alcohol use: Not Currently   Drug use: Never       OPHTHALMIC EXAM:  Base Eye Exam     Visual Acuity (Snellen - Linear)       Right Left   Dist cc 20/50 +1 20/200   Dist ph cc NI NI    Correction: Glasses         Tonometry (Tonopen, 1:31 PM)       Right Left   Pressure 14 14         Pupils       Dark Light Shape React APD   Right 3 2 Round Brisk None   Left 3 2 Round Brisk None         Visual Fields       Left Right    Full Full         Extraocular Movement       Right Left    Full, Ortho Full, Ortho         Neuro/Psych     Oriented x3: Yes   Mood/Affect: Normal         Dilation     Both eyes: 1.0% Mydriacyl, 2.5% Phenylephrine  @ 1:27 PM           Slit Lamp and Fundus Exam     Slit Lamp Exam       Right Left   Lids/Lashes Dermatochalasis - upper lid, Meibomian gland dysfunction, +Scurf Dermatochalasis - upper lid, Meibomian gland dysfunction, +Scurf   Conjunctiva/Sclera White and quiet White and quiet   Cornea trace PEE, mild tear film debris 1+PEE, mild  tear film debris   Anterior Chamber deep and clear deep and clear  Iris Round and dilated, No NVI Round and dilated, No NVI   Lens Clear Clear   Anterior Vitreous Vitreous syneresis mild syneresis         Fundus Exam       Right Left   Disc Pink and Sharp, no heme Pink and sharp, fine NVD -- regressed, 360 exudates   C/D Ratio 0.2 0.2   Macula Blunted foveal reflex, severe edema with severe exudation -- slightly improved, scattered DBH -- improved Blunted foveal reflex, severe edema and exudation, scattered DBH -- all slightly improved   Vessels attenuated, Tortuous, +NVE greatest along inferior arcades -- improving, +Sheathing -- improving attenuated, Tortuous, +NV, +Sheathing -- improving   Periphery Attached, scattered MA/DBH, heavy exudates greatest posteriorly Attached, severe exudation posteriorly, scattered MA / DBH           Refraction     Wearing Rx       Sphere Cylinder Axis Add   Right -2.50 +1.00 049 +1.00   Left -0.75 +1.25 178 +1.00            IMAGING AND PROCEDURES  Imaging and Procedures for 02/25/2023  OCT, Retina - OU - Both Eyes       Right Eye Quality was good. Central Foveal Thickness: 358. Progression has improved. Findings include no SRF, abnormal foveal contour, subretinal hyper-reflective material, intraretinal hyper-reflective material, intraretinal fluid, vitreomacular adhesion (persistent IRF, IRHM and SRHM -- slightly improved).   Left Eye Quality was good. Central Foveal Thickness: 396. Progression has improved. Findings include no SRF, abnormal foveal contour, subretinal hyper-reflective material, intraretinal hyper-reflective material, epiretinal membrane, intraretinal fluid, lamellar hole, macular pucker (Persistent edema, IRHM and SRHM -- slightly improved, +ERM with pucker).   Notes *Images captured and stored on drive  Diagnosis / Impression:  OD: persistent IRF, IRHM and SRHM -- slightly improved OS: Persistent edema, IRHM and  SRHM -- slightly improved, +ERM with pucker  Clinical management:  See below  Abbreviations: NFP - Normal foveal profile. CME - cystoid macular edema. PED - pigment epithelial detachment. IRF - intraretinal fluid. SRF - subretinal fluid. EZ - ellipsoid zone. ERM - epiretinal membrane. ORA - outer retinal atrophy. ORT - outer retinal tubulation. SRHM - subretinal hyper-reflective material. IRHM - intraretinal hyper-reflective material      Intravitreal Injection, Pharmacologic Agent - OD - Right Eye       Time Out 02/25/2023. 2:24 PM. Confirmed correct patient, procedure, site, and patient consented.   Anesthesia Topical anesthesia was used. Anesthetic medications included Lidocaine  2%, Proparacaine 0.5%.   Procedure Preparation included 5% betadine to ocular surface, eyelid speculum. A (32g) needle was used.   Injection: 2 mg aflibercept  2 MG/0.05ML   Route: Intravitreal, Site: Right Eye   NDC: D2246706, Lot: [id number], Expiration date: 05/22/2024, Waste: 0 mL   Post-op Post injection exam found visual acuity of at least counting fingers. The patient tolerated the procedure well. There were no complications. The patient received written and verbal post procedure care education.      Intravitreal Injection, Pharmacologic Agent - OS - Left Eye       Time Out 02/25/2023. 2:24 PM. Confirmed correct patient, procedure, site, and patient consented.   Anesthesia Topical anesthesia was used. Anesthetic medications included Lidocaine  2%, Proparacaine 0.5%.   Procedure Preparation included 5% betadine to ocular surface, eyelid speculum. A (32g) needle was used.   Injection: 2 mg aflibercept  2 MG/0.05ML   Route: Intravitreal, Site: Left Eye   NDC: D2246706, Lot: [id number], Expiration date:  06/21/2024, Waste: 0 mL   Post-op Post injection exam found visual acuity of at least counting fingers. The patient tolerated the procedure well. There were no complications. The patient  received written and verbal post procedure care education.            ASSESSMENT/PLAN:    ICD-10-CM   1. Proliferative diabetic retinopathy of both eyes with macular edema associated with type 2 diabetes mellitus (HCC)  E11.3513 OCT, Retina - OU - Both Eyes    Intravitreal Injection, Pharmacologic Agent - OD - Right Eye    Intravitreal Injection, Pharmacologic Agent - OS - Left Eye    aflibercept  (EYLEA ) SOLN 2 mg    aflibercept  (EYLEA ) SOLN 2 mg    2. Current use of insulin  (HCC)  Z79.4     3. Long term (current) use of oral hypoglycemic drugs  Z79.84     4. Hyperlipidemia associated with type 2 diabetes mellitus (HCC)  E11.69    E78.5     5. Essential hypertension  I10     6. Hypertensive retinopathy of both eyes  H35.033      1-3. Proliferative diabetic retinopathy, both eyes  - A1c 7.4 on 08.11.23  - s/p IVA OS #1 (02.22.24), #2 (03.22.24) #3 (04.19.24)  -s/p IVA OD #1 (02.23.24), #2 (03.22.24) #3 (04.19.24), #4 (05.17.24), #5 (06.18.24) #6(11.08.24)  - s/p IVE OD #1 (06.18.24), #2 (07.16.24), #3 (08.13.24), #4 (09.10.24), #5 (10.08.24), #6 (11.08.24), #7 (12.06.24) - s/p IVE OS #1 (05.17.24), #2 (06.18.24), #3 (7.16.24), #4 (08.13.24), #5 (09.10.24), #6 (10.08.24), #7 (11.08.24), #8 (12.06.24) - exam shows severe edema with scattered MA and DBH, but significant lipid component to edema OU - FA (02.21.24) shows OD: Large cluster of leaking NV along IT arcades, scattered patches of vascular non-perfusion; OS: +NVD and NVE along proximal arcades - BCVA OD improved to 20/50 from 20/60; OS improved to 20/150 from 20/200 - OCT shows OD: persistent IRF, IRHM and SRHM -- slightly improved; OS: Persistent edema, IRHM and SRHM -- slightly improved, +ERM with pucker at 4 wks - recommend IVE OU (OD #8 and OS #9) today, 01.03.25 w/ f/u in 4 wks - pt wishes to proceed - RBA of procedure discussed, questions answered - IVE informed consent obtained and signed, 05.17.24 (OU) - see  procedure note - Vabysmo  approved for 2024 - f/u 4 weeks -- DFE/OCT, possible injection OU  4. Hyperlipidemia  - review of labs shows severely elevated cholesterol and lipids  - 08.11.23:  Trig  785 Chol  598  LDL  410 - likely contributing to severe edema and exudates  5,6. Hypertensive retinopathy OU - discussed importance of tight BP control - continue to monitor   Ophthalmic Meds Ordered this visit:  Meds ordered this encounter  Medications   aflibercept  (EYLEA ) SOLN 2 mg   aflibercept  (EYLEA ) SOLN 2 mg     Return in about 4 weeks (around 03/25/2023) for f/u PDR OU, DFE, OCT, Possible Injxn.  There are no Patient Instructions on file for this visit.  This document serves as a record of services personally performed by Redell JUDITHANN Hans, MD, PhD. It was created on their behalf by Alan PARAS. Delores, OA an ophthalmic technician. The creation of this record is the provider's dictation and/or activities during the visit.    Electronically signed by: Alan PARAS. Delores, OA 02/25/23 4:14 PM   Redell JUDITHANN Hans, M.D., Ph.D. Diseases & Surgery of the Retina and Vitreous [facility] 02/25/2023  I have reviewed the above documentation for accuracy and completeness, and I agree with the above. Redell JUDITHANN Hans, M.D., Ph.D. 02/25/23 4:15 PM   Abbreviations: M myopia (nearsighted); A astigmatism; H hyperopia (farsighted); P presbyopia; Mrx spectacle prescription;  CTL contact lenses; OD right eye; OS left eye; OU both eyes  XT exotropia; ET esotropia; PEK punctate epithelial keratitis; PEE punctate epithelial erosions; DES dry eye syndrome; MGD meibomian gland dysfunction; ATs artificial tears; PFAT's preservative free artificial tears; NSC nuclear sclerotic cataract; PSC posterior subcapsular cataract; ERM epi-retinal membrane; PVD posterior vitreous detachment; RD retinal detachment; DM diabetes mellitus; DR diabetic retinopathy; NPDR non-proliferative diabetic  retinopathy; PDR proliferative diabetic retinopathy; CSME clinically significant macular edema; DME diabetic macular edema; dbh dot blot hemorrhages; CWS cotton wool spot; POAG primary open angle glaucoma; C/D cup-to-disc ratio; HVF humphrey visual field; GVF goldmann visual field; OCT optical coherence tomography; IOP intraocular pressure; BRVO Branch retinal vein occlusion; CRVO central retinal vein occlusion; CRAO central retinal artery occlusion; BRAO branch retinal artery occlusion; RT retinal tear; SB scleral buckle; PPV pars plana vitrectomy; VH Vitreous hemorrhage; PRP panretinal laser photocoagulation; IVK intravitreal kenalog; VMT vitreomacular traction; MH Macular hole;  NVD neovascularization of the disc; NVE neovascularization elsewhere; AREDS age related eye disease study; ARMD age related macular degeneration; POAG primary open angle glaucoma; EBMD epithelial/anterior basement membrane dystrophy; ACIOL anterior chamber intraocular lens; IOL intraocular lens; PCIOL posterior chamber intraocular lens; Phaco/IOL phacoemulsification with intraocular lens placement; PRK photorefractive keratectomy; LASIK laser assisted in situ keratomileusis; HTN hypertension; DM diabetes mellitus; COPD chronic obstructive pulmonary disease

## 2023-02-25 ENCOUNTER — Encounter (INDEPENDENT_AMBULATORY_CARE_PROVIDER_SITE_OTHER): Payer: Self-pay | Admitting: Ophthalmology

## 2023-02-25 ENCOUNTER — Ambulatory Visit (INDEPENDENT_AMBULATORY_CARE_PROVIDER_SITE_OTHER): Payer: BC Managed Care – PPO | Admitting: Ophthalmology

## 2023-02-25 DIAGNOSIS — Z794 Long term (current) use of insulin: Secondary | ICD-10-CM | POA: Diagnosis not present

## 2023-02-25 DIAGNOSIS — Z7984 Long term (current) use of oral hypoglycemic drugs: Secondary | ICD-10-CM | POA: Diagnosis not present

## 2023-02-25 DIAGNOSIS — I10 Essential (primary) hypertension: Secondary | ICD-10-CM | POA: Diagnosis not present

## 2023-02-25 DIAGNOSIS — H35033 Hypertensive retinopathy, bilateral: Secondary | ICD-10-CM

## 2023-02-25 DIAGNOSIS — E113513 Type 2 diabetes mellitus with proliferative diabetic retinopathy with macular edema, bilateral: Secondary | ICD-10-CM | POA: Diagnosis not present

## 2023-02-25 DIAGNOSIS — E1169 Type 2 diabetes mellitus with other specified complication: Secondary | ICD-10-CM

## 2023-02-25 MED ORDER — AFLIBERCEPT 2MG/0.05ML IZ SOLN FOR KALEIDOSCOPE
2.0000 mg | INTRAVITREAL | Status: AC | PRN
  Administered 2023-02-25: 2 mg via INTRAVITREAL

## 2023-03-11 DIAGNOSIS — Z139 Encounter for screening, unspecified: Secondary | ICD-10-CM | POA: Diagnosis not present

## 2023-03-17 ENCOUNTER — Encounter: Payer: Self-pay | Admitting: Neurology

## 2023-03-18 DIAGNOSIS — I700 Atherosclerosis of aorta: Secondary | ICD-10-CM | POA: Diagnosis not present

## 2023-03-18 DIAGNOSIS — G40109 Localization-related (focal) (partial) symptomatic epilepsy and epileptic syndromes with simple partial seizures, not intractable, without status epilepticus: Secondary | ICD-10-CM | POA: Diagnosis not present

## 2023-03-18 DIAGNOSIS — L209 Atopic dermatitis, unspecified: Secondary | ICD-10-CM | POA: Diagnosis not present

## 2023-03-18 DIAGNOSIS — I129 Hypertensive chronic kidney disease with stage 1 through stage 4 chronic kidney disease, or unspecified chronic kidney disease: Secondary | ICD-10-CM | POA: Diagnosis not present

## 2023-03-18 DIAGNOSIS — E1121 Type 2 diabetes mellitus with diabetic nephropathy: Secondary | ICD-10-CM | POA: Diagnosis not present

## 2023-03-18 DIAGNOSIS — E785 Hyperlipidemia, unspecified: Secondary | ICD-10-CM | POA: Diagnosis not present

## 2023-03-18 DIAGNOSIS — Q809 Congenital ichthyosis, unspecified: Secondary | ICD-10-CM | POA: Insufficient documentation

## 2023-03-18 DIAGNOSIS — E11319 Type 2 diabetes mellitus with unspecified diabetic retinopathy without macular edema: Secondary | ICD-10-CM | POA: Diagnosis not present

## 2023-03-20 DIAGNOSIS — G252 Other specified forms of tremor: Secondary | ICD-10-CM | POA: Insufficient documentation

## 2023-03-24 NOTE — Progress Notes (Signed)
[facility] - Clinic Note  03/25/2023     CHIEF COMPLAINT Patient presents for Retina Follow Up   HISTORY OF PRESENT ILLNESS: Jose Koch is a 53 y.o. male who presents to the clinic today for:   HPI     Retina Follow Up   Patient presents with  Diabetic Retinopathy.  In both eyes.  Severity is moderate.  Duration of 4 weeks.  Since onset it is stable.  I, the attending physician,  performed the HPI with the patient and updated documentation appropriately.        Comments   Patient states that he is able to see a little clearer. He is not using eye drops. His blood sugar was 203.      Last edited by Rennis Chris, MD on 03/25/2023  3:23 PM.     Patient states he can see the TV better  Referring physician: Benita Stabile, MD [address] [place],  [place] [address]  HISTORICAL INFORMATION:   Selected notes from the MEDICAL RECORD NUMBER Referred by Dr. Illene Labrador LEE:  Ocular Hx- PMH-    CURRENT MEDICATIONS: No current outpatient medications on file. (Ophthalmic Drugs)   No current facility-administered medications for this visit. (Ophthalmic Drugs)   Current Outpatient Medications (Other)  Medication Sig   aspirin EC 81 MG tablet Take 81 mg by mouth daily. Swallow whole.   atorvastatin (LIPITOR) 40 MG tablet Take 40 mg by mouth daily.   blood glucose meter kit and supplies KIT Dispense based on patient and insurance preference (something with affordable test strips). Use to check glucose twice daily as directed.   fenofibrate 160 MG tablet Take 160 mg by mouth daily.   fluticasone (FLONASE) 50 MCG/ACT nasal spray Place 2 sprays into both nostrils daily.   folic acid (FOLVITE) 1 MG tablet TAKE 1 TABLET BY MOUTH EVERY DAY   glucose blood (ACCU-CHEK GUIDE) test strip Use as instructed to monitor glucose twice daily, before breakfast and before bed   Insulin Glargine (BASAGLAR KWIKPEN) 100 UNIT/ML Inject 30 Units into the skin at bedtime.    Insulin Pen Needle 31G X 5 MM MISC 1 Units by Does not apply route daily.   JANUVIA 100 MG tablet Take 100 mg by mouth daily.   JARDIANCE 25 MG TABS tablet Take 25 mg by mouth daily.   levETIRAcetam (KEPPRA) 500 MG tablet Take 1 tablet (500 mg total) by mouth 2 (two) times daily.   metFORMIN (GLUCOPHAGE XR) 500 MG 24 hr tablet Take 1 tablet (500 mg total) by mouth daily with breakfast.   metFORMIN (GLUCOPHAGE) 1000 MG tablet Take 1,000 mg by mouth daily.   metoCLOPramide (REGLAN) 10 MG tablet Take 10 mg by mouth 3 (three) times daily before meals.   Misc Natural Products (OSTEO BI-FLEX ADV DOUBLE ST PO) Take 1 tablet by mouth.   OneTouch Delica Lancets 33G MISC PLEASE SEE ATTACHED FOR DETAILED DIRECTIONS   pantoprazole (PROTONIX) 40 MG tablet TAKE 1 TABLET BY MOUTH 2 TIMES DAILY BEFORE A MEAL 30 MINUTES BEFORE BREAKFAST AND DINNER   ticagrelor (BRILINTA) 90 MG TABS tablet Take 1 tablet (90 mg total) by mouth 2 (two) times daily.   No current facility-administered medications for this visit. (Other)   REVIEW OF SYSTEMS: ROS   Positive for: Cardiovascular, Eyes, Respiratory Negative for: Constitutional, Gastrointestinal, Neurological, Skin, Genitourinary, Musculoskeletal, HENT, Endocrine, Psychiatric, Allergic/Imm, Heme/Lymph Last edited by Charlette Caffey, COT on 03/25/2023  1:26 PM.  ALLERGIES No Known Allergies  PAST MEDICAL HISTORY Past Medical History:  Diagnosis Date   COVID    COVID 02/2020   very sick - admitted to the hospital   Diabetes mellitus without complication (HCC)    GERD (gastroesophageal reflux disease)    Headache    High cholesterol    History of kidney stones    Hypertension    was diagnosed 2 years ago, never treated and states BP is always - documented 07/13/21   Ichthyosis vulgaris    Pneumonia    Sleep apnea    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   Past Surgical History:  Procedure Laterality Date   IR ANGIO INTRA EXTRACRAN SEL  COM CAROTID INNOMINATE BILAT MOD SED  04/17/2021   IR ANGIO INTRA EXTRACRAN SEL INTERNAL CAROTID UNI R MOD SED  07/17/2021   IR ANGIO VERTEBRAL SEL VERTEBRAL BILAT MOD SED  04/17/2021   IR CT HEAD LTD  07/15/2021   IR INTRA CRAN STENT  07/15/2021   IR RADIOLOGIST EVAL & MGMT  03/05/2021   IR RADIOLOGIST EVAL & MGMT  05/04/2021   IR RADIOLOGIST EVAL & MGMT  07/31/2021   IR US GUIDE VASC ACCESS RIGHT  04/17/2021   IR US GUIDE VASC ACCESS RIGHT  07/15/2021   RADIOLOGY WITH ANESTHESIA N/A 07/15/2021   Procedure: STENTING;  Surgeon: Julieanne Cotton, MD;  Location: [facility] OR;  Service: Radiology;  Laterality: N/A;   TONSILLECTOMY     wisdom teeth removal     FAMILY HISTORY Family History  Family history unknown: Yes   SOCIAL HISTORY Social History   Tobacco Use   Smoking status: Never   Smokeless tobacco: Never  Vaping Use   Vaping status: Never Used  Substance Use Topics   Alcohol use: Not Currently   Drug use: Never       OPHTHALMIC EXAM:  Base Eye Exam     Visual Acuity (Snellen - Linear)       Right Left   Dist cc 20/50 +1 20/150 +1   Dist ph cc NI NI    Correction: Glasses         Tonometry (Tonopen, 1:31 PM)       Right Left   Pressure 12 13         Pupils       Dark Light Shape React APD   Right 3 2 Round Brisk None   Left 3 2 Round Brisk None         Visual Fields       Left Right    Full Full         Extraocular Movement       Right Left    Full, Ortho Full, Ortho         Neuro/Psych     Oriented x3: Yes   Mood/Affect: Normal         Dilation     Both eyes: 1.0% Mydriacyl, 2.5% Phenylephrine @ 1:27 PM           Slit Lamp and Fundus Exam     Slit Lamp Exam       Right Left   Lids/Lashes Dermatochalasis - upper lid, Meibomian gland dysfunction, +Scurf Dermatochalasis - upper lid, Meibomian gland dysfunction, +Scurf   Conjunctiva/Sclera White and quiet White and quiet   Cornea trace PEE, mild tear film debris 1+PEE, mild tear  film debris   Anterior Chamber deep and clear deep and clear  Iris Round and dilated, No NVI Round and dilated, No NVI   Lens Clear Clear   Anterior Vitreous Vitreous syneresis mild syneresis         Fundus Exam       Right Left   Disc Pink and Sharp, no heme Pink and sharp, fine NVD -- regressed, 360 exudates   C/D Ratio 0.2 0.2   Macula Blunted foveal reflex, severe edema with severe exudation -- slightly improved, scattered DBH -- improved Blunted foveal reflex, severe edema and exudation, scattered DBH -- all slightly improved   Vessels attenuated, Tortuous, +NVE greatest along inferior arcades -- improving, +Sheathing -- improving attenuated, Tortuous, +NV, +Sheathing -- improving   Periphery Attached, scattered MA/DBH, heavy exudates greatest posteriorly Attached, severe exudation posteriorly, scattered MA / DBH           Refraction     Wearing Rx       Sphere Cylinder Axis Add   Right -2.50 +1.00 049 +1.00   Left -0.75 +1.25 178 +1.00            IMAGING AND PROCEDURES  Imaging and Procedures for 03/25/2023  OCT, Retina - OU - Both Eyes       Right Eye Quality was good. Central Foveal Thickness: 350. Progression has improved. Findings include no SRF, abnormal foveal contour, subretinal hyper-reflective material, intraretinal hyper-reflective material, intraretinal fluid, vitreomacular adhesion (persistent IRF, IRHM and SRHM -- slightly improved).   Left Eye Quality was good. Central Foveal Thickness: 386. Progression has improved. Findings include no SRF, abnormal foveal contour, subretinal hyper-reflective material, intraretinal hyper-reflective material, epiretinal membrane, intraretinal fluid, lamellar hole, macular pucker (Persistent edema, IRHM and SRHM -- slightly improved, +ERM with pucker).   Notes *Images captured and stored on drive  Diagnosis / Impression:  OD: persistent IRF, IRHM and SRHM -- slightly improved OS: Persistent edema, IRHM and SRHM  -- slightly improved, +ERM with pucker  Clinical management:  See below  Abbreviations: NFP - Normal foveal profile. CME - cystoid macular edema. PED - pigment epithelial detachment. IRF - intraretinal fluid. SRF - subretinal fluid. EZ - ellipsoid zone. ERM - epiretinal membrane. ORA - outer retinal atrophy. ORT - outer retinal tubulation. SRHM - subretinal hyper-reflective material. IRHM - intraretinal hyper-reflective material      Intravitreal Injection, Pharmacologic Agent - OD - Right Eye       Time Out 03/25/2023. 2:14 PM. Confirmed correct patient, procedure, site, and patient consented.   Anesthesia Topical anesthesia was used. Anesthetic medications included Lidocaine 2%, Proparacaine 0.5%.   Procedure Preparation included 5% betadine to ocular surface, eyelid speculum. A (32g) needle was used.   Injection: 2 mg aflibercept 2 MG/0.05ML   Route: Intravitreal, Site: Right Eye   NDC: L6038910, Lot: [id number], Expiration date: 12/23/2023, Waste: 0 mL   Post-op Post injection exam found visual acuity of at least counting fingers. The patient tolerated the procedure well. There were no complications. The patient received written and verbal post procedure care education.      Intravitreal Injection, Pharmacologic Agent - OS - Left Eye       Time Out 03/25/2023. 2:18 PM. Confirmed correct patient, procedure, site, and patient consented.   Anesthesia Topical anesthesia was used. Anesthetic medications included Lidocaine 2%, Proparacaine 0.5%.   Procedure Preparation included 5% betadine to ocular surface, eyelid speculum. A (32g) needle was used.   Injection: 2 mg aflibercept 2 MG/0.05ML   Route: Intravitreal, Site: Left Eye   NDC: L6038910, Lot: [id number], Expiration date:  06/21/2024, Waste: 0 mL   Post-op Post injection exam found visual acuity of at least counting fingers. The patient tolerated the procedure well. There were no complications. The patient  received written and verbal post procedure care education.             ASSESSMENT/PLAN:    ICD-10-CM   1. Proliferative diabetic retinopathy of both eyes with macular edema associated with type 2 diabetes mellitus (HCC)  E11.3513 OCT, Retina - OU - Both Eyes    Intravitreal Injection, Pharmacologic Agent - OD - Right Eye    Intravitreal Injection, Pharmacologic Agent - OS - Left Eye    aflibercept (EYLEA) SOLN 2 mg    aflibercept (EYLEA) SOLN 2 mg    2. Current use of insulin (HCC)  Z79.4     3. Long term (current) use of oral hypoglycemic drugs  Z79.84     4. Hyperlipidemia associated with type 2 diabetes mellitus (HCC)  E11.69    E78.5     5. Essential hypertension  I10     6. Hypertensive retinopathy of both eyes  H35.033      1-3. Proliferative diabetic retinopathy, both eyes  - A1c 7.4 on 08.11.23  - s/p IVA OS #1 (02.22.24), #2 (03.22.24) #3 (04.19.24)  -s/p IVA OD #1 (02.23.24), #2 (03.22.24) #3 (04.19.24), #4 (05.17.24), #5 (06.18.24) #6(11.08.24)  - s/p IVE OD #1 (06.18.24), #2 (07.16.24), #3 (08.13.24), #4 (09.10.24), #5 (10.08.24), #6 (11.08.24), #7 (12.06.24), #8 (01.03.25) - s/p IVE OS #1 (05.17.24), #2 (06.18.24), #3 (7.16.24), #4 (08.13.24), #5 (09.10.24), #6 (10.08.24), #7 (11.08.24), #8 (12.06.24) #9(01.03.25) - exam shows severe edema with scattered MA and DBH, but significant lipid component to edema OU - FA (02.21.24) shows OD: Large cluster of leaking NV along IT arcades, scattered patches of vascular non-perfusion; OS: +NVD and NVE along proximal arcades - BCVA OD stable at 20/50; OS stable at 20/150 - OCT shows OD: persistent IRF, IRHM and SRHM -- slightly improved; OS: Persistent edema, IRHM and SRHM -- slightly improved, +ERM with pucker at 4 wks - recommend IVE OU (OD #9 and OS #10 ) today, 01.31.25 w/ f/u in 4-5 wks - pt wishes to proceed - RBA of procedure discussed, questions answered - IVE informed consent obtained and signed, 05.17.24 (OU) -  see procedure note - Vabysmo approved  - f/u 4-5 weeks -- DFE/OCT, possible injection OU -- possible FA  4. Hyperlipidemia  - review of labs shows severely elevated cholesterol and lipids  - 08.11.23:  Trig  785 Chol  598  LDL  410 - 01.17.25: Trig 335   Chol 279   LDL 173 - likely contributing to severe edema and exudates  5,6. Hypertensive retinopathy OU - discussed importance of tight BP control - continue to monitor   Ophthalmic Meds Ordered this visit:  Meds ordered this encounter  Medications   aflibercept (EYLEA) SOLN 2 mg   aflibercept (EYLEA) SOLN 2 mg     Return for f/u 4-5 weeks, PDR OU, DFE, OCT, Possible Injxn.  There are no Patient Instructions on file for this visit.  This document serves as a record of services personally performed by Karie Chimera, MD, PhD. It was created on their behalf by Berlin Hun COT, an ophthalmic technician. The creation of this record is the provider's dictation and/or activities during the visit.    Electronically signed by: Berlin Hun COT 01.30.25 4:31 PM  Karie Chimera, M.D., Ph.D. Diseases & Surgery of the Retina and  Vitreous [facility] 03/25/2023   I have reviewed the above documentation for accuracy and completeness, and I agree with the above. Karie Chimera, M.D., Ph.D. 03/26/23 4:41 PM   Abbreviations: M myopia (nearsighted); A astigmatism; H hyperopia (farsighted); P presbyopia; Mrx spectacle prescription;  CTL contact lenses; OD right eye; OS left eye; OU both eyes  XT exotropia; ET esotropia; PEK punctate epithelial keratitis; PEE punctate epithelial erosions; DES dry eye syndrome; MGD meibomian gland dysfunction; ATs artificial tears; PFAT's preservative free artificial tears; NSC nuclear sclerotic cataract; PSC posterior subcapsular cataract; ERM epi-retinal membrane; PVD posterior vitreous detachment; RD retinal detachment; DM diabetes mellitus; DR diabetic retinopathy;  NPDR non-proliferative diabetic retinopathy; PDR proliferative diabetic retinopathy; CSME clinically significant macular edema; DME diabetic macular edema; dbh dot blot hemorrhages; CWS cotton wool spot; POAG primary open angle glaucoma; C/D cup-to-disc ratio; HVF humphrey visual field; GVF goldmann visual field; OCT optical coherence tomography; IOP intraocular pressure; BRVO Branch retinal vein occlusion; CRVO central retinal vein occlusion; CRAO central retinal artery occlusion; BRAO branch retinal artery occlusion; RT retinal tear; SB scleral buckle; PPV pars plana vitrectomy; VH Vitreous hemorrhage; PRP panretinal laser photocoagulation; IVK intravitreal kenalog; VMT vitreomacular traction; MH Macular hole;  NVD neovascularization of the disc; NVE neovascularization elsewhere; AREDS age related eye disease study; ARMD age related macular degeneration; POAG primary open angle glaucoma; EBMD epithelial/anterior basement membrane dystrophy; ACIOL anterior chamber intraocular lens; IOL intraocular lens; PCIOL posterior chamber intraocular lens; Phaco/IOL phacoemulsification with intraocular lens placement; PRK photorefractive keratectomy; LASIK laser assisted in situ keratomileusis; HTN hypertension; DM diabetes mellitus; COPD chronic obstructive pulmonary disease

## 2023-03-25 ENCOUNTER — Ambulatory Visit (INDEPENDENT_AMBULATORY_CARE_PROVIDER_SITE_OTHER): Payer: BC Managed Care – PPO | Admitting: Ophthalmology

## 2023-03-25 ENCOUNTER — Encounter (INDEPENDENT_AMBULATORY_CARE_PROVIDER_SITE_OTHER): Payer: Self-pay | Admitting: Ophthalmology

## 2023-03-25 DIAGNOSIS — H35033 Hypertensive retinopathy, bilateral: Secondary | ICD-10-CM

## 2023-03-25 DIAGNOSIS — E1169 Type 2 diabetes mellitus with other specified complication: Secondary | ICD-10-CM

## 2023-03-25 DIAGNOSIS — Z794 Long term (current) use of insulin: Secondary | ICD-10-CM

## 2023-03-25 DIAGNOSIS — I10 Essential (primary) hypertension: Secondary | ICD-10-CM

## 2023-03-25 DIAGNOSIS — E113513 Type 2 diabetes mellitus with proliferative diabetic retinopathy with macular edema, bilateral: Secondary | ICD-10-CM | POA: Diagnosis not present

## 2023-03-25 DIAGNOSIS — Z7984 Long term (current) use of oral hypoglycemic drugs: Secondary | ICD-10-CM | POA: Diagnosis not present

## 2023-03-25 MED ORDER — AFLIBERCEPT 2MG/0.05ML IZ SOLN FOR KALEIDOSCOPE
2.0000 mg | INTRAVITREAL | Status: AC | PRN
  Administered 2023-03-25: 2 mg via INTRAVITREAL

## 2023-03-30 ENCOUNTER — Other Ambulatory Visit: Payer: Self-pay

## 2023-03-30 DIAGNOSIS — E538 Deficiency of other specified B group vitamins: Secondary | ICD-10-CM

## 2023-03-30 DIAGNOSIS — E611 Iron deficiency: Secondary | ICD-10-CM

## 2023-03-30 DIAGNOSIS — D696 Thrombocytopenia, unspecified: Secondary | ICD-10-CM

## 2023-04-01 ENCOUNTER — Inpatient Hospital Stay: Payer: BC Managed Care – PPO | Attending: Oncology

## 2023-04-01 DIAGNOSIS — R55 Syncope and collapse: Secondary | ICD-10-CM | POA: Diagnosis not present

## 2023-04-01 DIAGNOSIS — E11319 Type 2 diabetes mellitus with unspecified diabetic retinopathy without macular edema: Secondary | ICD-10-CM | POA: Diagnosis not present

## 2023-04-01 DIAGNOSIS — E538 Deficiency of other specified B group vitamins: Secondary | ICD-10-CM | POA: Diagnosis not present

## 2023-04-01 DIAGNOSIS — D509 Iron deficiency anemia, unspecified: Secondary | ICD-10-CM | POA: Insufficient documentation

## 2023-04-01 DIAGNOSIS — K5900 Constipation, unspecified: Secondary | ICD-10-CM | POA: Insufficient documentation

## 2023-04-01 DIAGNOSIS — R059 Cough, unspecified: Secondary | ICD-10-CM | POA: Diagnosis not present

## 2023-04-01 DIAGNOSIS — R161 Splenomegaly, not elsewhere classified: Secondary | ICD-10-CM | POA: Insufficient documentation

## 2023-04-01 DIAGNOSIS — Z9181 History of falling: Secondary | ICD-10-CM | POA: Insufficient documentation

## 2023-04-01 DIAGNOSIS — D696 Thrombocytopenia, unspecified: Secondary | ICD-10-CM | POA: Diagnosis not present

## 2023-04-01 DIAGNOSIS — E611 Iron deficiency: Secondary | ICD-10-CM

## 2023-04-01 LAB — COMPREHENSIVE METABOLIC PANEL
ALT: 27 U/L (ref 0–44)
AST: 23 U/L (ref 15–41)
Albumin: 3.8 g/dL (ref 3.5–5.0)
Alkaline Phosphatase: 48 U/L (ref 38–126)
Anion gap: 11 (ref 5–15)
BUN: 31 mg/dL — ABNORMAL HIGH (ref 6–20)
CO2: 21 mmol/L — ABNORMAL LOW (ref 22–32)
Calcium: 8.8 mg/dL — ABNORMAL LOW (ref 8.9–10.3)
Chloride: 103 mmol/L (ref 98–111)
Creatinine, Ser: 2.08 mg/dL — ABNORMAL HIGH (ref 0.61–1.24)
GFR, Estimated: 38 mL/min — ABNORMAL LOW (ref >60)
Glucose, Bld: 199 mg/dL — ABNORMAL HIGH (ref 70–99)
Potassium: 4.7 mmol/L (ref 3.5–5.1)
Sodium: 135 mmol/L (ref 135–145)
Total Bilirubin: 0.6 mg/dL (ref 0.0–1.2)
Total Protein: 7.2 g/dL (ref 6.5–8.1)

## 2023-04-01 LAB — LACTATE DEHYDROGENASE: LDH: 121 U/L (ref 98–192)

## 2023-04-01 LAB — CBC WITH DIFFERENTIAL/PLATELET
Abs Immature Granulocytes: 0.04 10*3/uL (ref 0.00–0.07)
Basophils Absolute: 0 10*3/uL (ref 0.0–0.1)
Basophils Relative: 1 %
Eosinophils Absolute: 0.1 10*3/uL (ref 0.0–0.5)
Eosinophils Relative: 1 %
HCT: 31 % — ABNORMAL LOW (ref 39.0–52.0)
Hemoglobin: 10 g/dL — ABNORMAL LOW (ref 13.0–17.0)
Immature Granulocytes: 1 %
Lymphocytes Relative: 29 %
Lymphs Abs: 1.6 10*3/uL (ref 0.7–4.0)
MCH: 28.5 pg (ref 26.0–34.0)
MCHC: 32.3 g/dL (ref 30.0–36.0)
MCV: 88.3 fL (ref 80.0–100.0)
Monocytes Absolute: 0.4 10*3/uL (ref 0.1–1.0)
Monocytes Relative: 8 %
Neutro Abs: 3.4 10*3/uL (ref 1.7–7.7)
Neutrophils Relative %: 60 %
Platelets: 161 10*3/uL (ref 150–400)
RBC: 3.51 MIL/uL — ABNORMAL LOW (ref 4.22–5.81)
RDW: 13.2 % (ref 11.5–15.5)
WBC: 5.6 10*3/uL (ref 4.0–10.5)
nRBC: 0 % (ref 0.0–0.2)

## 2023-04-01 LAB — IRON AND TIBC
Iron: 58 ug/dL (ref 45–182)
Saturation Ratios: 13 % — ABNORMAL LOW (ref 17.9–39.5)
TIBC: 452 ug/dL — ABNORMAL HIGH (ref 250–450)
UIBC: 394 ug/dL

## 2023-04-01 LAB — VITAMIN B12: Vitamin B-12: 438 pg/mL (ref 180–914)

## 2023-04-01 LAB — FOLATE: Folate: 17.5 ng/mL (ref >5.9)

## 2023-04-01 LAB — FERRITIN: Ferritin: 437 ng/mL — ABNORMAL HIGH (ref 24–336)

## 2023-04-08 ENCOUNTER — Inpatient Hospital Stay: Payer: BC Managed Care – PPO | Admitting: Oncology

## 2023-04-08 DIAGNOSIS — D696 Thrombocytopenia, unspecified: Secondary | ICD-10-CM

## 2023-04-08 DIAGNOSIS — E13319 Other specified diabetes mellitus with unspecified diabetic retinopathy without macular edema: Secondary | ICD-10-CM

## 2023-04-08 DIAGNOSIS — R161 Splenomegaly, not elsewhere classified: Secondary | ICD-10-CM | POA: Diagnosis not present

## 2023-04-08 DIAGNOSIS — E538 Deficiency of other specified B group vitamins: Secondary | ICD-10-CM

## 2023-04-08 NOTE — Progress Notes (Signed)
Virtual Visit via Telephone Note  I connected with Jose Koch on 04/08/23 at  2:30 PM EST by telephone and verified that I am speaking with the correct person using two identifiers.  Location: Patient: Home  Provider: Clinic    I discussed the limitations, risks, security and privacy concerns of performing an evaluation and management service by telephone and the availability of in person appointments. I also discussed with the patient that there may be a patient responsible charge related to this service. The patient expressed understanding and agreed to proceed.   History of Present Illness: Patient is a 53 year old male with past medical history significant for thrombocytopenia and splenomegaly.  He was last seen in clinic on 11/26/2022 by me.  He continues to be followed closely by ophthalmology for newly diagnosed diabetic retinopathy in both eyes.  He continues to receive bilateral eye injections (Eylea).   Reports a few falls over the past few months over christmas.  No injuries.  Reports loss of consciousness.  He does see neurology and has an appointment in May.  Reports overall he is doing well.  No pain.  Has an occasional cough.  Reports constipation secondary to iron tablets.  Denies any bleeding.  Observations/Objective: Review of Systems  Respiratory:  Positive for cough.   Gastrointestinal:  Positive for constipation.  Musculoskeletal:  Positive for falls.  Neurological:  Positive for dizziness.   Physical Exam Vitals reviewed.  Neurological:     Mental Status: He is alert and oriented to person, place, and time.      Assessment and Plan: 1. Splenomegaly (Primary) -CT AP on 12/03/2020 showed stable splenomegaly with no significant change in multiple splenic hypodensities.  No adenopathy. -Denies any B symptoms. -He is currently on aspirin and Brilinta for right ICA stent placement. -Labs from 04/01/2023 show hemoglobin of 10.0, platelets 161 with normal  differential.  Ferritin 437.  2. Thrombocytopenia (HCC) -Mild to moderate thrombocytopenia since 2017 likely from splenomegaly. -Most recent labs show a platelet count of 161. -Recheck labs in 6 months.  3. Folate deficiency -Previously noted to have a low folate level at 4.2. -He was started on 1 mg folic acid supplements daily. -Repeat folate levels from 04/01/2023 are 17.5. -Continue every other day folic acid.  4. Diabetic retinopathy of both eyes associated with diabetes mellitus of other type, macular edema presence unspecified, unspecified retinopathy severity (HCC) -He is followed by Dr. Vanessa Barbara ophthalmology and has been receiving Eylea injections into both his eyes.  This appears to be helping his vision.  5. Syncope -Patient had two falls where he lost consciousness. -Reports he was standing and then the next second he was on the ground. -Sustained superficial injuries but did not seek medical treatment. -Last fall was around Christmas time. -Patient does see neurology and next appointment is in May.  6. IDA -Patient has been anemic since February 2022. -He has been anywhere between 9.5 and 12.5. -he denies any bleeding.  He is taking oral iron supplements daily with mild constipation. -We discussed increasing oral iron from once per day to twice daily versus IV iron to get ferritin closer to 100.  Follow Up Instructions: -Continue every other day folic acid. -Increase iron from once daily to twice per day along with vitamin C to increase absorption.  If unable to tolerate, reduce back down to 1 daily. -Review information regarding IV iron. -Return to clinic in 4 months with labs a few days before and office visit.   I discussed  the assessment and treatment plan with the patient. The patient was provided an opportunity to ask questions and all were answered. The patient agreed with the plan and demonstrated an understanding of the instructions.   The patient was advised  to call back or seek an in-person evaluation if the symptoms worsen or if the condition fails to improve as anticipated.  I provided 20 minutes of non-face-to-face time during this encounter.   Mauro Kaufmann, NP

## 2023-04-21 NOTE — Progress Notes (Signed)
 [facility] - Clinic Note  04/22/2023     CHIEF COMPLAINT Patient presents for Retina Follow Up   HISTORY OF PRESENT ILLNESS: Jose Koch is a 53 y.o. male who presents to the clinic today for:   HPI     Retina Follow Up   Patient presents with  Diabetic Retinopathy.  In both eyes.  Severity is moderate.  Duration of 4 weeks.  Since onset it is gradually improving.  I, the attending physician,  performed the HPI with the patient and updated documentation appropriately.        Comments   4 week Retina eval . Patient states some improvement. Blood sugar 193      Last edited by Rennis Chris, MD on 04/22/2023 10:03 AM.     Referring physician: Benita Stabile, MD [address] [place],  [place] [address]  HISTORICAL INFORMATION:   Selected notes from the MEDICAL RECORD NUMBER Referred by Dr. Illene Labrador LEE:  Ocular Hx- PMH-    CURRENT MEDICATIONS: No current outpatient medications on file. (Ophthalmic Drugs)   No current facility-administered medications for this visit. (Ophthalmic Drugs)   Current Outpatient Medications (Other)  Medication Sig   aspirin EC 81 MG tablet Take 81 mg by mouth daily. Swallow whole.   atorvastatin (LIPITOR) 40 MG tablet Take 40 mg by mouth daily.   blood glucose meter kit and supplies KIT Dispense based on patient and insurance preference (something with affordable test strips). Use to check glucose twice daily as directed.   fenofibrate 160 MG tablet Take 160 mg by mouth daily.   ferrous sulfate 325 (65 FE) MG tablet Take by mouth.   fluticasone (FLONASE) 50 MCG/ACT nasal spray Place 2 sprays into both nostrils daily.   folic acid (FOLVITE) 1 MG tablet TAKE 1 TABLET BY MOUTH EVERY DAY   glucose blood (ACCU-CHEK GUIDE) test strip Use as instructed to monitor glucose twice daily, before breakfast and before bed   Insulin Glargine (BASAGLAR KWIKPEN) 100 UNIT/ML Inject 30 Units into the skin at bedtime.   Insulin  Pen Needle 31G X 5 MM MISC 1 Units by Does not apply route daily.   JANUVIA 100 MG tablet Take 100 mg by mouth daily.   JARDIANCE 25 MG TABS tablet Take 25 mg by mouth daily.   levETIRAcetam (KEPPRA) 500 MG tablet Take 1 tablet (500 mg total) by mouth 2 (two) times daily.   metFORMIN (GLUCOPHAGE XR) 500 MG 24 hr tablet Take 1 tablet (500 mg total) by mouth daily with breakfast.   metFORMIN (GLUCOPHAGE) 1000 MG tablet Take 1,000 mg by mouth daily.   metoCLOPramide (REGLAN) 10 MG tablet Take 10 mg by mouth 3 (three) times daily before meals.   Misc Natural Products (OSTEO BI-FLEX ADV DOUBLE ST PO) Take 1 tablet by mouth.   OneTouch Delica Lancets 33G MISC PLEASE SEE ATTACHED FOR DETAILED DIRECTIONS   pantoprazole (PROTONIX) 40 MG tablet TAKE 1 TABLET BY MOUTH 2 TIMES DAILY BEFORE A MEAL 30 MINUTES BEFORE BREAKFAST AND DINNER   ticagrelor (BRILINTA) 90 MG TABS tablet Take 1 tablet (90 mg total) by mouth 2 (two) times daily.   No current facility-administered medications for this visit. (Other)   REVIEW OF SYSTEMS: ROS   Positive for: Cardiovascular, Eyes, Respiratory Negative for: Constitutional, Gastrointestinal, Neurological, Skin, Genitourinary, Musculoskeletal, HENT, Endocrine, Psychiatric, Allergic/Imm, Heme/Lymph Last edited by Lana Fish, COT on 04/22/2023  8:38 AM.     ALLERGIES No Known Allergies  PAST MEDICAL HISTORY Past Medical History:  Diagnosis Date   COVID    COVID 02/2020   very sick - admitted to the hospital   Diabetes mellitus without complication (HCC)    GERD (gastroesophageal reflux disease)    Headache    High cholesterol    History of kidney stones    Hypertension    was diagnosed 2 years ago, never treated and states BP is always - documented 07/13/21   Ichthyosis vulgaris    Pneumonia    Sleep apnea    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   Past Surgical History:  Procedure Laterality Date   IR ANGIO INTRA EXTRACRAN SEL COM CAROTID  INNOMINATE BILAT MOD SED  04/17/2021   IR ANGIO INTRA EXTRACRAN SEL INTERNAL CAROTID UNI R MOD SED  07/17/2021   IR ANGIO VERTEBRAL SEL VERTEBRAL BILAT MOD SED  04/17/2021   IR CT HEAD LTD  07/15/2021   IR INTRA CRAN STENT  07/15/2021   IR RADIOLOGIST EVAL & MGMT  03/05/2021   IR RADIOLOGIST EVAL & MGMT  05/04/2021   IR RADIOLOGIST EVAL & MGMT  07/31/2021   IR US GUIDE VASC ACCESS RIGHT  04/17/2021   IR US GUIDE VASC ACCESS RIGHT  07/15/2021   RADIOLOGY WITH ANESTHESIA N/A 07/15/2021   Procedure: STENTING;  Surgeon: Julieanne Cotton, MD;  Location: [facility] OR;  Service: Radiology;  Laterality: N/A;   TONSILLECTOMY     wisdom teeth removal     FAMILY HISTORY Family History  Family history unknown: Yes   SOCIAL HISTORY Social History   Tobacco Use   Smoking status: Never   Smokeless tobacco: Never  Vaping Use   Vaping status: Never Used  Substance Use Topics   Alcohol use: Not Currently   Drug use: Never       OPHTHALMIC EXAM:  Base Eye Exam     Visual Acuity (Snellen - Linear)       Right Left   Dist cc 20/60+1 20/200-1   Dist ph cc 20/50 20/150-2    Correction: Glasses         Tonometry (Tonopen, 10:06 AM)       Right Left   Pressure 22 19         Pupils       Dark Light Shape React APD   Right 3 2 Round Brisk None   Left 3 2 Round Brisk None         Visual Fields (Counting fingers)       Left Right    Full Full         Extraocular Movement       Right Left    Full, Ortho Full, Ortho         Neuro/Psych     Oriented x3: Yes   Mood/Affect: Normal         Dilation     Both eyes: 1.0% Mydriacyl, 2.5% Phenylephrine @ 8:40 AM           Slit Lamp and Fundus Exam     Slit Lamp Exam       Right Left   Lids/Lashes Dermatochalasis - upper lid, Meibomian gland dysfunction, +Scurf Dermatochalasis - upper lid, Meibomian gland dysfunction, +Scurf   Conjunctiva/Sclera White and quiet White and quiet   Cornea trace PEE, mild tear film debris  1+PEE, mild tear film debris   Anterior Chamber deep and clear deep and clear   Iris Round and dilated,  No NVI Round and dilated, No NVI   Lens Clear Clear   Anterior Vitreous Vitreous syneresis mild syneresis         Fundus Exam       Right Left   Disc Pink and Sharp, no heme Pink and sharp, fine NVD -- regressed, 360 exudates   C/D Ratio 0.2 0.2   Macula Blunted foveal reflex, severe edema with severe exudation, scattered DBH -- improved Blunted foveal reflex, severe edema and exudation, scattered DBH   Vessels attenuated, Tortuous, +NVE greatest along inferior arcades -- improving, +Sheathing -- improving attenuated, Tortuous, +NV, +Sheathing of arterioles   Periphery Attached, scattered MA/DBH, heavy exudates greatest posteriorly Attached, severe exudation posteriorly, scattered MA / DBH           Refraction     Wearing Rx       Sphere Cylinder Axis Add   Right -2.50 +1.00 049 +1.00   Left -0.75 +1.25 178 +1.00            IMAGING AND PROCEDURES  Imaging and Procedures for 04/22/2023  OCT, Retina - OU - Both Eyes       Right Eye Quality was good. Central Foveal Thickness: 367. Progression has worsened. Findings include no SRF, abnormal foveal contour, subretinal hyper-reflective material, intraretinal hyper-reflective material, intraretinal fluid, vitreomacular adhesion (persistent IRF, IRHM and SRHM -- slightly increased).   Left Eye Quality was good. Central Foveal Thickness: 389. Progression has been stable. Findings include no SRF, abnormal foveal contour, subretinal hyper-reflective material, intraretinal hyper-reflective material, epiretinal membrane, intraretinal fluid, lamellar hole, macular pucker (Persistent edema, IRHM and SRHM, +ERM with pucker).   Notes *Images captured and stored on drive  Diagnosis / Impression:  OD: persistent IRF, IRHM and SRHM -- slightly increased OS: Persistent edema, IRHM and SRHM, +ERM with pucker  Clinical management:   See below  Abbreviations: NFP - Normal foveal profile. CME - cystoid macular edema. PED - pigment epithelial detachment. IRF - intraretinal fluid. SRF - subretinal fluid. EZ - ellipsoid zone. ERM - epiretinal membrane. ORA - outer retinal atrophy. ORT - outer retinal tubulation. SRHM - subretinal hyper-reflective material. IRHM - intraretinal hyper-reflective material      Intravitreal Injection, Pharmacologic Agent - OD - Right Eye       Time Out 04/22/2023. 10:09 AM. Confirmed correct patient, procedure, site, and patient consented.   Anesthesia Topical anesthesia was used. Anesthetic medications included Lidocaine 2%, Proparacaine 0.5%.   Procedure Preparation included 5% betadine to ocular surface, eyelid speculum. A (32g) needle was used.   Injection: 2 mg aflibercept 2 MG/0.05ML   Route: Intravitreal, Site: Right Eye   NDC: L6038910, Lot: [id number], Expiration date: 03/24/2024, Waste: 0 mL   Post-op Post injection exam found visual acuity of at least counting fingers. The patient tolerated the procedure well. There were no complications. The patient received written and verbal post procedure care education.      Intravitreal Injection, Pharmacologic Agent - OS - Left Eye       Time Out 04/22/2023. 10:10 AM. Confirmed correct patient, procedure, site, and patient consented.   Anesthesia Topical anesthesia was used. Anesthetic medications included Lidocaine 2%, Proparacaine 0.5%.   Procedure Preparation included 5% betadine to ocular surface, eyelid speculum. A (32g) needle was used.   Injection: 2 mg aflibercept 2 MG/0.05ML   Route: Intravitreal, Site: Left Eye   NDC: L6038910, Lot: [id number], Expiration date: 07/21/2024, Waste: 0 mL   Post-op Post injection exam found visual acuity of at least  counting fingers. The patient tolerated the procedure well. There were no complications. The patient received written and verbal post procedure care education.             ASSESSMENT/PLAN:    ICD-10-CM   1. Proliferative diabetic retinopathy of both eyes with macular edema associated with type 2 diabetes mellitus (HCC)  E11.3513 OCT, Retina - OU - Both Eyes    Intravitreal Injection, Pharmacologic Agent - OD - Right Eye    Intravitreal Injection, Pharmacologic Agent - OS - Left Eye    aflibercept (EYLEA) SOLN 2 mg    aflibercept (EYLEA) SOLN 2 mg    2. Current use of insulin (HCC)  Z79.4     3. Long term (current) use of oral hypoglycemic drugs  Z79.84     4. Hyperlipidemia associated with type 2 diabetes mellitus (HCC)  E11.69    E78.5     5. Essential hypertension  I10      1-3. Proliferative diabetic retinopathy, both eyes  - A1c 7.4 on 08.11.23  - s/p IVA OS #1 (02.22.24), #2 (03.22.24) #3 (04.19.24)  -s/p IVA OD #1 (02.23.24), #2 (03.22.24) #3 (04.19.24), #4 (05.17.24), #5 (06.18.24) #6(11.08.24)  - s/p IVE OD #1 (06.18.24), #2 (07.16.24), #3 (08.13.24), #4 (09.10.24), #5 (10.08.24), #6 (11.08.24), #7 (12.06.24), #8 (01.03.25) #9(01.31.25) - s/p IVE OS #1 (05.17.24), #2 (06.18.24), #3 (7.16.24), #4 (08.13.24), #5 (09.10.24), #6 (10.08.24), #7 (11.08.24), #8 (12.06.24) #9(01.03.25) #10(01.31.25) - exam shows severe edema with scattered MA and DBH, but significant lipid component to edema OU - FA (02.21.24) shows OD: Large cluster of leaking NV along IT arcades, scattered patches of vascular non-perfusion; OS: +NVD and NVE along proximal arcades - BCVA OD stable at 20/50; OS stable at 20/150 - OCT shows OD: persistent IRF, IRHM and SRHM -- slightly increased; OS: Persistent edema, IRHM and SRHM, +ERM with pucker at 4 wks **discussed decreased efficacy / resistance to Eylea and potential benefit of switching medication* - recommend IVE OU (OD #10 and OS #11 ) today, 02.28.25 w/ f/u in 4-5 wks - pt wishes to proceed - RBA of procedure discussed, questions answered - IVE informed consent obtained and signed, 05.17.24 (OU) - see procedure  note - will check [employer] auth for next time - f/u 4 weeks -- DFE/OCT, possible injection OU -- possible FA  4. Hyperlipidemia  - review of labs shows severely elevated cholesterol and lipids  - 08.11.23:  Trig  785 Chol  598  LDL  410 - 01.17.25: Trig 335   Chol 279   LDL 173 - likely contributing to severe edema and exudates  5,6. Hypertensive retinopathy OU - discussed importance of tight BP control - continue to monitor   Ophthalmic Meds Ordered this visit:  Meds ordered this encounter  Medications   aflibercept (EYLEA) SOLN 2 mg   aflibercept (EYLEA) SOLN 2 mg     Return in 4 weeks (on 05/20/2023) for f/u 4 weeks, PDR OU, DFE, OCT, Possible Injxn.  There are no Patient Instructions on file for this visit. This document serves as a record of services personally performed by Karie Chimera, MD, PhD. It was created on their behalf by Berlin Hun COT, an ophthalmic technician. The creation of this record is the provider's dictation and/or activities during the visit.    Electronically signed by: Berlin Hun COT 02.27.25 11:12 PM  This document serves as a record of services personally performed by Karie Chimera, MD, PhD. It was created on their  behalf by Glee Arvin. Manson Passey, OA an ophthalmic technician. The creation of this record is the provider's dictation and/or activities during the visit.    Electronically signed by: Glee Arvin. Manson Passey, OA 04/24/23 11:12 PM  Karie Chimera, M.D., Ph.D. Diseases & Surgery of the Retina and Vitreous [facility] 04/22/2023   I have reviewed the above documentation for accuracy and completeness, and I agree with the above. Karie Chimera, M.D., Ph.D. 04/24/23 11:14 PM   Abbreviations: M myopia (nearsighted); A astigmatism; H hyperopia (farsighted); P presbyopia; Mrx spectacle prescription;  CTL contact lenses; OD right eye; OS left eye; OU both eyes  XT exotropia; ET esotropia; PEK punctate epithelial  keratitis; PEE punctate epithelial erosions; DES dry eye syndrome; MGD meibomian gland dysfunction; ATs artificial tears; PFAT's preservative free artificial tears; NSC nuclear sclerotic cataract; PSC posterior subcapsular cataract; ERM epi-retinal membrane; PVD posterior vitreous detachment; RD retinal detachment; DM diabetes mellitus; DR diabetic retinopathy; NPDR non-proliferative diabetic retinopathy; PDR proliferative diabetic retinopathy; CSME clinically significant macular edema; DME diabetic macular edema; dbh dot blot hemorrhages; CWS cotton wool spot; POAG primary open angle glaucoma; C/D cup-to-disc ratio; HVF humphrey visual field; GVF goldmann visual field; OCT optical coherence tomography; IOP intraocular pressure; BRVO Branch retinal vein occlusion; CRVO central retinal vein occlusion; CRAO central retinal artery occlusion; BRAO branch retinal artery occlusion; RT retinal tear; SB scleral buckle; PPV pars plana vitrectomy; VH Vitreous hemorrhage; PRP panretinal laser photocoagulation; IVK intravitreal kenalog; VMT vitreomacular traction; MH Macular hole;  NVD neovascularization of the disc; NVE neovascularization elsewhere; AREDS age related eye disease study; ARMD age related macular degeneration; POAG primary open angle glaucoma; EBMD epithelial/anterior basement membrane dystrophy; ACIOL anterior chamber intraocular lens; IOL intraocular lens; PCIOL posterior chamber intraocular lens; Phaco/IOL phacoemulsification with intraocular lens placement; PRK photorefractive keratectomy; LASIK laser assisted in situ keratomileusis; HTN hypertension; DM diabetes mellitus; COPD chronic obstructive pulmonary disease

## 2023-04-22 ENCOUNTER — Ambulatory Visit (INDEPENDENT_AMBULATORY_CARE_PROVIDER_SITE_OTHER): Payer: BC Managed Care – PPO | Admitting: Ophthalmology

## 2023-04-22 ENCOUNTER — Encounter (INDEPENDENT_AMBULATORY_CARE_PROVIDER_SITE_OTHER): Payer: Self-pay | Admitting: Ophthalmology

## 2023-04-22 DIAGNOSIS — I10 Essential (primary) hypertension: Secondary | ICD-10-CM | POA: Diagnosis not present

## 2023-04-22 DIAGNOSIS — Z794 Long term (current) use of insulin: Secondary | ICD-10-CM

## 2023-04-22 DIAGNOSIS — Z7984 Long term (current) use of oral hypoglycemic drugs: Secondary | ICD-10-CM | POA: Diagnosis not present

## 2023-04-22 DIAGNOSIS — E113513 Type 2 diabetes mellitus with proliferative diabetic retinopathy with macular edema, bilateral: Secondary | ICD-10-CM | POA: Diagnosis not present

## 2023-04-22 DIAGNOSIS — E1169 Type 2 diabetes mellitus with other specified complication: Secondary | ICD-10-CM

## 2023-04-22 MED ORDER — AFLIBERCEPT 2MG/0.05ML IZ SOLN FOR KALEIDOSCOPE
2.0000 mg | INTRAVITREAL | Status: AC | PRN
  Administered 2023-04-22: 2 mg via INTRAVITREAL

## 2023-04-30 ENCOUNTER — Telehealth: Admitting: Nurse Practitioner

## 2023-04-30 DIAGNOSIS — J208 Acute bronchitis due to other specified organisms: Secondary | ICD-10-CM | POA: Diagnosis not present

## 2023-04-30 MED ORDER — PREDNISONE 20 MG PO TABS
40.0000 mg | ORAL_TABLET | Freq: Every day | ORAL | 0 refills | Status: AC
Start: 2023-04-30 — End: 2023-05-05

## 2023-04-30 NOTE — Progress Notes (Signed)
 Virtual Visit Consent   Jose Koch, you are scheduled for a virtual visit with a [facility] provider today. Just as with appointments in the office, your consent must be obtained to participate. Your consent will be active for this visit and any virtual visit you may have with one of our providers in the next 365 days. If you have a MyChart account, a copy of this consent can be sent to you electronically.  As this is a virtual visit, video technology does not allow for your provider to perform a traditional examination. This may limit your provider's ability to fully assess your condition. If your provider identifies any concerns that need to be evaluated in person or the need to arrange testing (such as labs, EKG, etc.), we will make arrangements to do so. Although advances in technology are sophisticated, we cannot ensure that it will always work on either your end or our end. If the connection with a video visit is poor, the visit may have to be switched to a telephone visit. With either a video or telephone visit, we are not always able to ensure that we have a secure connection.  By engaging in this virtual visit, you consent to the provision of healthcare and authorize for your insurance to be billed (if applicable) for the services provided during this visit. Depending on your insurance coverage, you may receive a charge related to this service.  I need to obtain your verbal consent now. Are you willing to proceed with your visit today? Jose Koch has provided verbal consent on 04/30/2023 for a virtual visit (video or telephone). Jose Rigg, NP  Date: 04/30/2023 4:53 PM   Virtual Visit via Video Note   I, Jose Koch, connected with  Jose Koch  ([id number], 12-Nov-1970) on 04/30/23 at  5:00 PM EST by a video-enabled telemedicine application and verified that I am speaking with the correct person using two identifiers.  Location: Patient: Virtual Visit Location Patient:  Home Provider: Virtual Visit Location Provider: Home Office   I discussed the limitations of evaluation and management by telemedicine and the availability of in person appointments. The patient expressed understanding and agreed to proceed.    History of Present Illness: Jose Koch is a 53 y.o. who identifies as a male who was assigned male at birth, and is being seen today for viral bronchitis.   Jose Koch has been experiencing persistent cough over the past 24 hours. States he and his significant other were around their parents who were also experiencing URI symptoms.  Negative for COVID and Flu. He is currently not taking any medication for cough due to concern for interaction with medications. He does not have a history of smoking, COPD or asthma.   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 07/15/2021   Aneurysm (HCC) 05/26/2021   Temporal lobe epilepsy (HCC) 05/26/2021   Statin intolerance 04/08/2020   Nausea & vomiting 04/06/2020   Abdominal pain 04/06/2020   COVID-19 virus infection 04/06/2020   Dehydration 04/06/2020   Thrombocytopenia (HCC) 04/06/2020   Hyperglycemia due to diabetes mellitus (HCC) 04/06/2020   Generalized weakness 04/06/2020   AKI (acute kidney injury) (HCC) 04/06/2020   Essential hypertension 04/06/2020   Cerebral parenchymal hemorrhage (HCC) 04/06/2020   Splenomegaly 04/06/2020   Multiple joint pain 12/12/2017   Primary osteoarthritis of right knee 06/25/2015   DM (diabetes mellitus), type 2, uncontrolled w/neurologic complication  02/23/2015   Hyperlipidemia 02/23/2015   Ichthyosis vulgaris 02/23/2015    Allergies: No Known Allergies Medications:  Current Outpatient Medications:    predniSONE (DELTASONE) 20 MG tablet, Take 2 tablets (40 mg total) by mouth daily with breakfast for 5 days., Disp: 10 tablet, Rfl: 0   aspirin EC 81 MG tablet, Take 81 mg by  mouth daily. Swallow whole., Disp: , Rfl:    atorvastatin (LIPITOR) 40 MG tablet, Take 40 mg by mouth daily., Disp: , Rfl:    blood glucose meter kit and supplies KIT, Dispense based on patient and insurance preference (something with affordable test strips). Use to check glucose twice daily as directed., Disp: 1 each, Rfl: 0   fenofibrate 160 MG tablet, Take 160 mg by mouth daily., Disp: , Rfl:    ferrous sulfate 325 (65 FE) MG tablet, Take by mouth., Disp: , Rfl:    fluticasone (FLONASE) 50 MCG/ACT nasal spray, Place 2 sprays into both nostrils daily., Disp: , Rfl:    folic acid (FOLVITE) 1 MG tablet, TAKE 1 TABLET BY MOUTH EVERY DAY, Disp: 90 tablet, Rfl: 1   glucose blood (ACCU-CHEK GUIDE) test strip, Use as instructed to monitor glucose twice daily, before breakfast and before bed, Disp: 100 each, Rfl: 12   Insulin Glargine (BASAGLAR KWIKPEN) 100 UNIT/ML, Inject 30 Units into the skin at bedtime., Disp: , Rfl:    Insulin Pen Needle 31G X 5 MM MISC, 1 Units by Does not apply route daily., Disp: 100 each, Rfl: 1   JANUVIA 100 MG tablet, Take 100 mg by mouth daily., Disp: , Rfl:    JARDIANCE 25 MG TABS tablet, Take 25 mg by mouth daily., Disp: , Rfl:    levETIRAcetam (KEPPRA) 500 MG tablet, Take 1 tablet (500 mg total) by mouth 2 (two) times daily., Disp: 180 tablet, Rfl: 3   metFORMIN (GLUCOPHAGE XR) 500 MG 24 hr tablet, Take 1 tablet (500 mg total) by mouth daily with breakfast., Disp: 90 tablet, Rfl: 0   metFORMIN (GLUCOPHAGE) 1000 MG tablet, Take 1,000 mg by mouth daily., Disp: , Rfl:    metoCLOPramide (REGLAN) 10 MG tablet, Take 10 mg by mouth 3 (three) times daily before meals., Disp: , Rfl:    Misc Natural Products (OSTEO BI-FLEX ADV DOUBLE ST PO), Take 1 tablet by mouth., Disp: , Rfl:    OneTouch Delica Lancets 33G MISC, PLEASE SEE ATTACHED FOR DETAILED DIRECTIONS, Disp: , Rfl:    pantoprazole (PROTONIX) 40 MG tablet, TAKE 1 TABLET BY MOUTH 2 TIMES DAILY BEFORE A MEAL 30 MINUTES BEFORE  BREAKFAST AND DINNER, Disp: 60 tablet, Rfl: 3   ticagrelor (BRILINTA) 90 MG TABS tablet, Take 1 tablet (90 mg total) by mouth 2 (two) times daily., Disp: 60 tablet, Rfl: 5  Observations/Objective: Patient is well-developed, well-nourished in no acute distress.  Resting comfortably at home.  Head is normocephalic, atraumatic.  No labored breathing.  Speech is clear and coherent with logical content.  Patient is alert and oriented at baseline.    Assessment and Plan: 1. Viral bronchitis (Primary) - predniSONE (DELTASONE) 20 MG tablet; Take 2 tablets (40 mg total) by mouth daily with breakfast for 5 days.  Dispense: 10 tablet; Refill: 0  Recommend diabetic cough syrups OTC  Follow Up Instructions: I discussed the assessment and treatment plan with the patient. The patient was provided an opportunity to ask questions and all were answered. The patient agreed with the plan and demonstrated an understanding of the instructions.  A copy  of instructions were sent to the patient via MyChart unless otherwise noted below.    The patient was advised to call back or seek an in-person evaluation if the symptoms worsen or if the condition fails to improve as anticipated.    Jose Rigg, NP

## 2023-04-30 NOTE — Patient Instructions (Signed)
 Jose Koch, thank you for joining [name], NP for today's virtual visit.  While this provider is not your primary care provider (PCP), if your PCP is located in our provider database this encounter information will be shared with them immediately following your visit.   A [facility] MyChart account gives you access to today's visit and all your visits, tests, and labs performed at [facility] " click here if you don't have a  MyChart account or go to [email or web address]  Consent: (Patient) Jose Koch provided verbal consent for this virtual visit at the beginning of the encounter.  Current Medications:  Current Outpatient Medications:    predniSONE (DELTASONE) 20 MG tablet, Take 2 tablets (40 mg total) by mouth daily with breakfast for 5 days., Disp: 10 tablet, Rfl: 0   aspirin EC 81 MG tablet, Take 81 mg by mouth daily. Swallow whole., Disp: , Rfl:    atorvastatin (LIPITOR) 40 MG tablet, Take 40 mg by mouth daily., Disp: , Rfl:    blood glucose meter kit and supplies KIT, Dispense based on patient and insurance preference (something with affordable test strips). Use to check glucose twice daily as directed., Disp: 1 each, Rfl: 0   fenofibrate 160 MG tablet, Take 160 mg by mouth daily., Disp: , Rfl:    ferrous sulfate 325 (65 FE) MG tablet, Take by mouth., Disp: , Rfl:    fluticasone (FLONASE) 50 MCG/ACT nasal spray, Place 2 sprays into both nostrils daily., Disp: , Rfl:    folic acid (FOLVITE) 1 MG tablet, TAKE 1 TABLET BY MOUTH EVERY DAY, Disp: 90 tablet, Rfl: 1   glucose blood (ACCU-CHEK GUIDE) test strip, Use as instructed to monitor glucose twice daily, before breakfast and before bed, Disp: 100 each, Rfl: 12   Insulin Glargine (BASAGLAR KWIKPEN) 100 UNIT/ML, Inject 30 Units into the skin at bedtime., Disp: , Rfl:    Insulin Pen Needle 31G X 5 MM MISC, 1 Units by Does not apply route daily., Disp: 100 each, Rfl: 1   JANUVIA 100 MG tablet, Take  100 mg by mouth daily., Disp: , Rfl:    JARDIANCE 25 MG TABS tablet, Take 25 mg by mouth daily., Disp: , Rfl:    levETIRAcetam (KEPPRA) 500 MG tablet, Take 1 tablet (500 mg total) by mouth 2 (two) times daily., Disp: 180 tablet, Rfl: 3   metFORMIN (GLUCOPHAGE XR) 500 MG 24 hr tablet, Take 1 tablet (500 mg total) by mouth daily with breakfast., Disp: 90 tablet, Rfl: 0   metFORMIN (GLUCOPHAGE) 1000 MG tablet, Take 1,000 mg by mouth daily., Disp: , Rfl:    metoCLOPramide (REGLAN) 10 MG tablet, Take 10 mg by mouth 3 (three) times daily before meals., Disp: , Rfl:    Misc Natural Products (OSTEO BI-FLEX ADV DOUBLE ST PO), Take 1 tablet by mouth., Disp: , Rfl:    OneTouch Delica Lancets 33G MISC, PLEASE SEE ATTACHED FOR DETAILED DIRECTIONS, Disp: , Rfl:    pantoprazole (PROTONIX) 40 MG tablet, TAKE 1 TABLET BY MOUTH 2 TIMES DAILY BEFORE A MEAL 30 MINUTES BEFORE BREAKFAST AND DINNER, Disp: 60 tablet, Rfl: 3   ticagrelor (BRILINTA) 90 MG TABS tablet, Take 1 tablet (90 mg total) by mouth 2 (two) times daily., Disp: 60 tablet, Rfl: 5   Medications ordered in this encounter:  Meds ordered this encounter  Medications   predniSONE (DELTASONE) 20 MG tablet    Sig: Take 2 tablets (40 mg total) by mouth daily with breakfast for 5  days.    Dispense:  10 tablet    Refill:  0    Supervising Provider:   Merrilee Jansky [phone]     *If you need refills on other medications prior to your next appointment, please contact your pharmacy*  Follow-Up: Call back or seek an in-person evaluation if the symptoms worsen or if the condition fails to improve as anticipated.  [facility] [phone]  Other Instructions Recommend diabetic cough syrups OTC   If you have been instructed to have an in-person evaluation today at a local Urgent Care facility, please use the link below. It will take you to a list of all of our available [facility], including address, phone number and hours of  operation. Please do not delay care.  [facility]  If you or a family member do not have a primary care provider, use the link below to schedule a visit and establish care. When you choose a [facility] primary care physician or advanced practice provider, you gain a long-term partner in health. Find a Primary Care Provider  Learn more about [facility] in-office and virtual care options: [facility] - Get Care Now

## 2023-05-05 ENCOUNTER — Ambulatory Visit: Payer: 59 | Admitting: Medical Genetics

## 2023-05-05 VITALS — BP 111/72 | HR 77 | Wt 163.8 lb

## 2023-05-05 DIAGNOSIS — R161 Splenomegaly, not elsewhere classified: Secondary | ICD-10-CM | POA: Diagnosis not present

## 2023-05-05 DIAGNOSIS — E782 Mixed hyperlipidemia: Secondary | ICD-10-CM | POA: Diagnosis not present

## 2023-05-05 DIAGNOSIS — Q800 Ichthyosis vulgaris: Secondary | ICD-10-CM | POA: Diagnosis not present

## 2023-05-05 DIAGNOSIS — I729 Aneurysm of unspecified site: Secondary | ICD-10-CM | POA: Diagnosis not present

## 2023-05-05 NOTE — Progress Notes (Signed)
 MEDICAL GENETICS NEW PATIENT EVALUATION  Patient name: Jose Koch DOB: 12-18-1970 Age: 53 y.o. MRN: [id number]  Referring Provider/Specialty: Micael Hampshire, FNP  Date of Evaluation: 05/05/2023 Chief Complaint/Reason for Referral: Ichthyosis, hyperlipidemia  Assessment: We discussed with Jose Koch that it is unclear at this time if he has a unifying genetic condition vs more than one condition as the cause to his clinical features. Some of his features could possibly overlap with lipodystrophy disorders, but this may not explain all of his symptoms. It is also possible he has a primary genetic abnormality with several subsequent secondary medical issues. Therefore, appropriate genetic testing at this time should be broad in order to cover these possibilities, which could potentially affect his management. Jose Koch was interested in genetic testing being performed, and consent and buccal samples were obtained for a proband-only exome sequencing study through [facility] Results are expected in about 2 months, and we will contact him when they are available. Jose Koch should otherwise continue follow up with his current providers per their recommendations.  Recommendations: Proband-only exome sequencing through [facility] - results expected in 2 months. Continue follow up with current medical providers per their recommendations.  Follow up will be based on the results of the testing.   HPI: Jose Koch is a 53 y.o. assigned male at birth who presents today for an initial genetics evaluation for multiple medical concerns. He is accompanied by his firlfriend's mother, who helped provide the history. This information, along with a review of pertinent records, labs, and radiology studies, is summarized below.  Jose Koch has has many medical concerns as noted below. 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, referred to genetics by his dermatologist for ichthyosis testing - Heme: spenomegaly  Past Medical History: Patient Active Problem List   Diagnosis Date Noted   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 07/15/2021   Aneurysm (HCC) 05/26/2021   Temporal lobe epilepsy (HCC) 05/26/2021   Statin intolerance 04/08/2020   Nausea & vomiting 04/06/2020   Abdominal pain 04/06/2020   COVID-19 virus infection 04/06/2020   Dehydration 04/06/2020   Thrombocytopenia (HCC) 04/06/2020   Hyperglycemia due to diabetes mellitus (HCC) 04/06/2020   Generalized weakness 04/06/2020   AKI (acute kidney injury) (HCC) 04/06/2020   Essential hypertension 04/06/2020   Cerebral parenchymal hemorrhage (HCC) 04/06/2020   Splenomegaly 04/06/2020   Multiple joint pain 12/12/2017   Primary osteoarthritis of right knee 06/25/2015   DM (diabetes mellitus), type 2, uncontrolled w/neurologic complication 02/23/2015   Hyperlipidemia 02/23/2015   Ichthyosis vulgaris 02/23/2015   Medications: Current Outpatient Medications on File Prior to Visit  Medication Sig Dispense Refill   aspirin EC 81 MG tablet Take 81 mg by mouth daily. Swallow whole.     atorvastatin (LIPITOR) 40 MG tablet Take 40 mg by mouth daily.     blood glucose meter kit and supplies KIT Dispense based on patient and insurance preference (something with affordable test strips). Use to check glucose twice daily as  directed. 1 each 0   fenofibrate 160 MG tablet Take 160 mg by mouth daily.     ferrous  sulfate 325 (65 FE) MG tablet Take by mouth.     fluticasone (FLONASE) 50 MCG/ACT nasal spray Place 2 sprays into both nostrils daily.     folic acid (FOLVITE) 1 MG tablet TAKE 1 TABLET BY MOUTH EVERY DAY 90 tablet 1   glucose blood (ACCU-CHEK GUIDE) test strip Use as instructed to monitor glucose twice daily, before breakfast and before bed 100 each 12   Insulin Glargine (BASAGLAR KWIKPEN) 100 UNIT/ML Inject 30 Units into the skin at bedtime.     Insulin Pen Needle 31G X 5 MM MISC 1 Units by Does not apply route daily. 100 each 1   JANUVIA 100 MG tablet Take 100 mg by mouth daily.     JARDIANCE 25 MG TABS tablet Take 25 mg by mouth daily.     levETIRAcetam (KEPPRA) 500 MG tablet Take 1 tablet (500 mg total) by mouth 2 (two) times daily. 180 tablet 3   metFORMIN (GLUCOPHAGE XR) 500 MG 24 hr tablet Take 1 tablet (500 mg total) by mouth daily with breakfast. 90 tablet 0   metFORMIN (GLUCOPHAGE) 1000 MG tablet Take 1,000 mg by mouth daily.     metoCLOPramide (REGLAN) 10 MG tablet Take 10 mg by mouth 3 (three) times daily before meals.     Misc Natural Products (OSTEO BI-FLEX ADV DOUBLE ST PO) Take 1 tablet by mouth.     OneTouch Delica Lancets 33G MISC PLEASE SEE ATTACHED FOR DETAILED DIRECTIONS     pantoprazole (PROTONIX) 40 MG tablet TAKE 1 TABLET BY MOUTH 2 TIMES DAILY BEFORE A MEAL 30 MINUTES BEFORE BREAKFAST AND DINNER 60 tablet 3   predniSONE (DELTASONE) 20 MG tablet Take 2 tablets (40 mg total) by mouth daily with breakfast for 5 days. 10 tablet 0   ticagrelor (BRILINTA) 90 MG TABS tablet Take 1 tablet (90 mg total) by mouth 2 (two) times daily. 60 tablet 5   No current facility-administered medications on file prior to visit.   Allergies:  No Known Allergies  Review of Systems: Negative except as noted in the HPI  Family History: None available as his biological mother was adopted and he does not have contact with his biological father Self-reported ancestry: Mixed  European Consanguinity: Denies Please see the genetic counselor note for additional information  Social History: Lives with girlfriend, girlfriend's mother, and daughter in [place]; currently unemployed  Vitals: Weight: 163.8 lb Height: 5'8" Head circumference: 57 cm   Genetics Physical Exam:  Constitution: The patient is active and alert  Head: No abnormalities detected in: head, hairline, shape or size    Anterior fontanelle flat: not flat    Anterior fontanelle open: not open    Bitemporal narrowing: forehead not narrow    Frontal bossing: no frontal bossing    Macrocephaly: not macrocephalic    Microcephaly: not microcephalic    Plagiocephaly: not plagiocephalic  Face: No abnormalities detected in: face, midface or shape    Coarse facial features: no coarse facies    Midfacial hypoplasia: no midfacial hypoplasia  Eyes: (comments: Decreased acuity with areas of blurry vision)  Ears: (comments: Small with overfolding of helices and some fusion of helix to the antihelix)  Nose: No abnormalities detected in: nose, nasal bridge or nasal tip    Bulbous nasal tip: no prominent nasal tip    Columella below nares: no columella below nares  Depressed nasal bridge: no depressed nasal bridge    Flat nasal bridge: no flat nasal bridge    Hypoplastic alae nasi: nasal alae not underdeveloped     Upturned nasal tip: non-upturned nasal tip  Mouth: (comments: Missing teeth)  Neck: No abnormalities detected in: neck    Cysts: no cysts    Pits: no pits in neck    Redundant nuchal skin: no redundant neck skin    Webbing: no webbed neck  Chest: No abnormalities detected in: chest, appearance, clavicles or scapulae    Inverted nipples: nipples not inverted    Pectus excavatum: no pectus excavatum  Cardiac: No abnormalities detected in: cardiovascular system    Abnormal distal perfusion: normal distal perfusion    Irregular rate: heart rate regular    Irregular  rhythm: regular rhythm    Murmur: no murmur  Lungs: No abnormalities detected in: pulmonary system, bilateral auscultation or effort  Abdomen: No abnormalities detected in: abdomen or appearance    Abnormal umbilicus: normal umbilicus    Diastasis recti: no diastasis recti    Distended abdomen: no distension    Hepatosplenomegaly: no hepatosplenomegaly    Umbilical hernia: no umbilical hernia  Neurological: No abnormalities detected in: neurological system, deep tendon reflexes, antigravity movement of extremities, strength, facial movement or tone    Hypertonia: not hypertonic    Hypotonia: not hypotonic  Genitourinary: not assessed  Hair, Nails, and Skin: (comments: Significant ichthyosis over lower legs with hyperpigmentation, mild ichthyosis on arms, seborrhea on scalp, face, ears, and neck)  Extremities: No abnormalities detected in: extremities    Asymmetric girth: symmetric girth    Contractures: no joint contractures    Limited range of motion: non-limited ROM  Hands and Feet: No abnormalities detected in: distal extremities    Clinodactyly: no clinodactyly    Polydactyly: no polydactyly    Single palmar crease: multiple palmar creases    Syndactyly: no syndactyly   Photo of patient available (verbal consent obtained)   Jose Haldeman-Englert, MD [facility]/Genetics Date: 05/05/2023 Time: 1130   Total time spent: 70 minutes Time spent includes face to face and non-face to face care for the patient on the date of this encounter (history and physical, genetic counseling, coordination of care, data gathering and/or documentation as outlined).  Genetic counselor: Lambert Mody, MS, [facility]

## 2023-05-06 DIAGNOSIS — N189 Chronic kidney disease, unspecified: Secondary | ICD-10-CM | POA: Diagnosis not present

## 2023-05-06 DIAGNOSIS — N1832 Chronic kidney disease, stage 3b: Secondary | ICD-10-CM | POA: Diagnosis not present

## 2023-05-06 DIAGNOSIS — E1122 Type 2 diabetes mellitus with diabetic chronic kidney disease: Secondary | ICD-10-CM | POA: Diagnosis not present

## 2023-05-06 DIAGNOSIS — N170 Acute kidney failure with tubular necrosis: Secondary | ICD-10-CM | POA: Diagnosis not present

## 2023-05-06 NOTE — Progress Notes (Signed)
 GENETIC COUNSELING NEW PATIENT EVALUATION Patient name: Jose Koch DOB: 05-03-1970 Age: 53 y.o. MRN: [id number]  Referring Provider/Specialty: Micael Hampshire, FNP  Date of Evaluation: 05/05/2023 Chief Complaint/Reason for Referral: Ichthyosis, hyperlipidemia   Brief Summary: Jose Koch is a 53 y.o. male who presents today for an initial genetics evaluation for ichthyosis and hyperlipidemia. He is accompanied by his partner's mother at today's visit.  Prior genetic testing has not been performed.   Family History: There is limited family history information available.  Jose Koch mother was adopted and he does not have contact with his biological father.   Prior Genetic testing: None  Genetic Counseling: Jose Koch is a 53 y.o. male with ichthyosis, hyperlipidemia, and other medical concerns.  Jose Koch reports having ichthyosis since his birth.  He has significantly dry and scaly skin across his body including his face, scalp, arms, legs, and feet.  He is most severely affected on his lower legs and feet.  The areas typically do not cause pain and he is not currently on any treatment.    Jose Koch has also been diagnosed with hyperlipidemia.  He is unsure of when this was first noted, however records indicate elevated lipids in 2017 at age 81 yo, it is unclear if this was a concern prior to this time.  Most recently, his cholesterol was elevated at 492 mg/dL, his triglycerides were elevated at 747 mg/dL, and his HDL was reduced at 34 mg/dL.  He has associated vision loss and it is suspected that his stenosis of the intracranial portions of the right internal carotid artery may also be associated with his persistently high lipids.  Jose Koch also has many other health concerns including hearing loss of unknown etiology, a cerebral cavernoma, small ears, chronic kidney disease, type 2 diabetes, splenomegaly, thrombocytopenia, syncope, and temporal lobe epilepsy.  Genetic  considerations were reviewed with Jose Koch. He is aware that we have over 20,000 genes, each with an important role in the body. All of the genes are packaged into structures called chromosomes. We have two copies of every chromosome- one that is inherited from each parent- and thus two copies of every gene. Given Mr. Burcher's features, concern for a genetic cause of his symptoms has arisen. If a specific genetic abnormality can be identified, it may help provide further insight into prognosis, management, and recurrence risk.  At this time, there is no specific genetic diagnosis evident in Jose Koch. Given his complicated medical and developmental history, a broad approach to genetic testing is recommended. We discussed that it is possible Jose Koch may have more than one genetic condition given his symptoms of ichthyosis, hyperlipidemia, and other health concerns. Specifically, we recommend exome sequencing (ES). Exome sequencing assesses all of the coding regions (exons) of the genes for any variants that could be associated with an individual's symptoms.   Jose Koch is interested in pursuing this testing today and would like to know of secondary findings as well. The consent form, possible results (positive, negative, and variant of uncertain significance), and expected timeline were reviewed. A buccal sample was collected today from Jose Koch to be sent to [facility] for [employer]. Results are expected in 2-4 months, at which point we will reach out with more information.  Recommendations: [facility] Exome Sequencing Continue follow-up with other healthcare providers as recommended.  Date: 05/06/2023 Total time spent: 75 minutes Genetic Counselor-only time: 25 minutes  Time spent includes face to face and non-face to face  care for the patient on the date of this encounter (history, genetic counseling, coordination of care, data gathering and/or documentation as outlined).    Lambert Mody MS [facility] Certified Genetic Counselor [facility] [employer]

## 2023-05-12 ENCOUNTER — Encounter: Payer: Self-pay | Admitting: Neurology

## 2023-05-12 DIAGNOSIS — R809 Proteinuria, unspecified: Secondary | ICD-10-CM | POA: Diagnosis not present

## 2023-05-12 DIAGNOSIS — D631 Anemia in chronic kidney disease: Secondary | ICD-10-CM | POA: Diagnosis not present

## 2023-05-12 DIAGNOSIS — N1832 Chronic kidney disease, stage 3b: Secondary | ICD-10-CM | POA: Diagnosis not present

## 2023-05-12 DIAGNOSIS — E1122 Type 2 diabetes mellitus with diabetic chronic kidney disease: Secondary | ICD-10-CM | POA: Insufficient documentation

## 2023-05-12 DIAGNOSIS — N189 Chronic kidney disease, unspecified: Secondary | ICD-10-CM | POA: Diagnosis not present
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
## 2023-05-13 ENCOUNTER — Telehealth: Payer: Self-pay | Admitting: Neurology

## 2023-05-13 ENCOUNTER — Ambulatory Visit: Admitting: Neurology

## 2023-05-13 ENCOUNTER — Encounter: Payer: Self-pay | Admitting: Neurology

## 2023-05-13 VITALS — BP 113/75 | HR 96 | Ht 68.0 in | Wt 161.0 lb

## 2023-05-13 DIAGNOSIS — R251 Tremor, unspecified: Secondary | ICD-10-CM | POA: Diagnosis not present

## 2023-05-13 MED ORDER — CARBIDOPA-LEVODOPA 25-100 MG PO TABS: ORAL_TABLET | ORAL | 0 refills | Status: DC

## 2023-05-13 NOTE — Telephone Encounter (Signed)
 Pt's girlfriend called in stating the pharmacy was concerned that the carbidopa-levodopa he went to get today might interfere with his metoclopramide.

## 2023-05-13 NOTE — Addendum Note (Signed)
 Addended byEverlena Cooper, Lex Linhares R on: 05/13/2023 12:29 PM   Modules accepted: Orders

## 2023-05-13 NOTE — Patient Instructions (Addendum)
 1. Start carbidopa (Sinemet) 25/100mg  tablets.  Week 1: Take 0.5 tablet in morning and 0.5 tablet in evening/bedtime.  Week 2: Take 0.5 tablet in morning, 0.5 tablet in afternoon, and 0.5 tablet in evening/bedtime.  Week 3 & thereafter: Take 1 tablet three times daily.   Take the medication at the same time everyday. The medication does not get absorbed into your body as well, if you take it with protein-containing foods (meat, dairy, beans). Try taking the medication about one hour before meals. If you experience nausea by taking it on an empty stomach, you may take it with carbohydrate-containing food,such as bread or crackers. Side effects to look out for, include dizziness, nausea, vivid dreams and hallucinations. If you experience any of these symptoms, please call us.  2.  Check MRI of brain without contrast 3.  Exercise 4.  Follow up 4 months.    Parkinson's Disease Parkinson's disease causes problems with movement. It makes it harder for you to walk or control your body. Each person with the disease is affected differently. Treatments can help you manage your symptoms. Parkinson's disease can range from mild to very bad, but it gets worse over time. This often happens slowly over many years. What are the causes? Parkinson's disease is caused by a loss of brain cells called neurons. These brain cells make a substance called dopamine, which is needed to control body movement. It's not known what causes the brain cells to die. What increases the risk? Being male. Being 11 years of age or older. Having a family history of Parkinson's disease. Having an injury to the brain in the past. Being around things that are harmful or poisonous, such as pesticides. Having depression. This is when you feel sad or hopeless. What are the signs or symptoms? Symptoms can vary and get worse over time. The main symptoms can be seen in your movement. These include: Shaking or tremors that you can't  control. This happens while you're resting. Stiffness in your arms and legs. Losing facial expressions. Walking in a way that isn't normal. You may walk with short, shuffling steps. Loss of balance when standing. You may sway, fall backward, or have trouble making turns. Other symptoms include: Being very sad, worried, or nervous. Having delusions. These are strong beliefs that aren't true. Having hallucinations. This is when you see, hear, taste, smell, or feel things that aren't real. Trouble speaking or swallowing. Trouble pooping (constipation). Needing to pee (urinate) right away, peeing often, or losing control of when you pee or poop. Sleep problems. How is this diagnosed? A diagnosis may be made based on symptoms, your medical history, and a physical exam. You may also have imaging tests that make pictures of your brain. How is this treated? There is no cure for Parkinson's disease. The goal of treatment is to manage your symptoms. It may include: Medicines. Therapy to help with talking or movement. Surgery to reduce shaking and other movements that you can't control. Follow these instructions at home: Medicines Take your medicines only as told by your health care provider. Avoid taking pain or sleeping medicines. These can affect your thinking. Activity Ask your provider if it's safe for you to drive. Exercise as told by your provider or physical therapist. Lifestyle  Put grab bars and railings in your home, especially near the toilet and in the shower. These help prevent falls. Do not smoke, vape, or use products with nicotine or tobacco in them. If you need help quitting, talk with  your provider. Do not drink alcohol. General instructions Talk with your provider to find out what kind of help you need at home. Ask about ways to stay safe. Join a support group for people with Parkinson's disease. Where to find more information [employer] of Neurological Disorders  and Stroke: [employer] Parkinson's Foundation: parkinson.org Contact a health care provider if: Medicines don't help your symptoms. You have a lot of side effects from your medicines. You keep losing your balance or you fall. You need more help at home. You have trouble swallowing. You have a very hard time pooping. You feel very sad, worried, or confused. You see, hear, taste, smell, or feel things that aren't real. Get help right away if: You were hurt in a fall. You can't swallow without choking. You have chest pain or trouble breathing. You don't feel safe at home. These symptoms may be an emergency. Call 911 right away. Do not wait to see if the symptoms will go away. Do not drive yourself to the hospital. Also, get help right away if: You feel like you may hurt yourself or others. You have thoughts about taking your own life. Take one of these steps: Go to your nearest emergency room. Call 911. Call the National Suicide Prevention Lifeline at (941) 163-8225 or 988. Text the Crisis Text Line at 340-311-4414. This information is not intended to replace advice given to you by your health care provider. Make sure you discuss any questions you have with your health care provider. Document Revised: 11/11/2022 Document Reviewed: 04/26/2022 Elsevier Patient Education  2024 [employer].

## 2023-05-13 NOTE — Telephone Encounter (Signed)
 This has been ordered needs to send to [facility] cone and prior authorization, pending at this time

## 2023-05-18 ENCOUNTER — Other Ambulatory Visit: Payer: Self-pay

## 2023-05-18 DIAGNOSIS — R251 Tremor, unspecified: Secondary | ICD-10-CM

## 2023-05-18 NOTE — Progress Notes (Signed)
 [facility] - Clinic Note  05/20/2023     CHIEF COMPLAINT Patient presents for Retina Follow Up   HISTORY OF PRESENT ILLNESS: Jose Koch is a 53 y.o. male who presents to the clinic today for:   HPI     Retina Follow Up   Patient presents with  Diabetic Retinopathy.  In both eyes.  This started 4 weeks ago.  I, the attending physician,  performed the HPI with the patient and updated documentation appropriately.        Comments   Patient here for 4 weeks retina follow up for PDR OU. Patient states vision been doing alright. No eye pain.       Last edited by Rennis Chris, MD on 05/22/2023  3:01 PM.    Pt states he was having tremors and was told he has Parkinson's, but it was just a medication he was taking for nausea, he had genetic testing done a couple weeks ago and is waiting on those resilts  Referring physician: Benita Stabile, MD [address] [place],  [place] [address]  HISTORICAL INFORMATION:   Selected notes from the MEDICAL RECORD NUMBER Referred by Dr. Illene Labrador LEE:  Ocular Hx- PMH-    CURRENT MEDICATIONS: No current outpatient medications on file. (Ophthalmic Drugs)   No current facility-administered medications for this visit. (Ophthalmic Drugs)   Current Outpatient Medications (Other)  Medication Sig   aspirin EC 81 MG tablet Take 81 mg by mouth daily. Swallow whole.   atorvastatin (LIPITOR) 40 MG tablet Take 40 mg by mouth daily.   blood glucose meter kit and supplies KIT Dispense based on patient and insurance preference (something with affordable test strips). Use to check glucose twice daily as directed.   fenofibrate 160 MG tablet Take 160 mg by mouth daily.   ferrous sulfate 325 (65 FE) MG tablet Take by mouth.   folic acid (FOLVITE) 1 MG tablet TAKE 1 TABLET BY MOUTH EVERY DAY   glucose blood (ACCU-CHEK GUIDE) test strip Use as instructed to monitor glucose twice daily, before breakfast and before bed   Insulin  Glargine (BASAGLAR KWIKPEN) 100 UNIT/ML Inject 30 Units into the skin at bedtime.   Insulin Pen Needle 31G X 5 MM MISC 1 Units by Does not apply route daily.   JANUVIA 100 MG tablet Take 100 mg by mouth daily.   JARDIANCE 25 MG TABS tablet Take 25 mg by mouth daily.   levETIRAcetam (KEPPRA) 500 MG tablet Take 1 tablet (500 mg total) by mouth 2 (two) times daily.   Misc Natural Products (OSTEO BI-FLEX ADV DOUBLE ST PO) Take 1 tablet by mouth.   OneTouch Delica Lancets 33G MISC PLEASE SEE ATTACHED FOR DETAILED DIRECTIONS   pantoprazole (PROTONIX) 40 MG tablet TAKE 1 TABLET BY MOUTH 2 TIMES DAILY BEFORE A MEAL 30 MINUTES BEFORE BREAKFAST AND DINNER   predniSONE (DELTASONE) 20 MG tablet Take 20 mg by mouth daily with breakfast.   ticagrelor (BRILINTA) 90 MG TABS tablet Take 1 tablet (90 mg total) by mouth 2 (two) times daily.   fluticasone (FLONASE) 50 MCG/ACT nasal spray Place 2 sprays into both nostrils daily. (Patient not taking: Reported on 05/20/2023)   metoCLOPramide (REGLAN) 10 MG tablet Take 10 mg by mouth 3 (three) times daily before meals. (Patient not taking: Reported on 05/20/2023)   No current facility-administered medications for this visit. (Other)   REVIEW OF SYSTEMS: ROS   Positive for: Cardiovascular, Eyes, Respiratory Negative for:  Constitutional, Gastrointestinal, Neurological, Skin, Genitourinary, Musculoskeletal, HENT, Endocrine, Psychiatric, Allergic/Imm, Heme/Lymph Last edited by Laddie Aquas, COA on 05/20/2023  1:30 PM.      ALLERGIES No Known Allergies  PAST MEDICAL HISTORY Past Medical History:  Diagnosis Date   COVID    COVID 02/2020   very sick - admitted to the hospital   Diabetes mellitus without complication (HCC)    GERD (gastroesophageal reflux disease)    Headache    High cholesterol    History of kidney stones    Hypertension    was diagnosed 2 years ago, never treated and states BP is always - documented 07/13/21   Ichthyosis vulgaris     Pneumonia    Sleep apnea    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   Past Surgical History:  Procedure Laterality Date   IR ANGIO INTRA EXTRACRAN SEL COM CAROTID INNOMINATE BILAT MOD SED  04/17/2021   IR ANGIO INTRA EXTRACRAN SEL INTERNAL CAROTID UNI R MOD SED  07/17/2021   IR ANGIO VERTEBRAL SEL VERTEBRAL BILAT MOD SED  04/17/2021   IR CT HEAD LTD  07/15/2021   IR INTRA CRAN STENT  07/15/2021   IR RADIOLOGIST EVAL & MGMT  03/05/2021   IR RADIOLOGIST EVAL & MGMT  05/04/2021   IR RADIOLOGIST EVAL & MGMT  07/31/2021   IR US GUIDE VASC ACCESS RIGHT  04/17/2021   IR US GUIDE VASC ACCESS RIGHT  07/15/2021   RADIOLOGY WITH ANESTHESIA N/A 07/15/2021   Procedure: STENTING;  Surgeon: Julieanne Cotton, MD;  Location: [facility] OR;  Service: Radiology;  Laterality: N/A;   TONSILLECTOMY     wisdom teeth removal     FAMILY HISTORY Family History  Problem Relation Age of Onset   Bladder Cancer Mother 65       smoker   SOCIAL HISTORY Social History   Tobacco Use   Smoking status: Never   Smokeless tobacco: Never  Vaping Use   Vaping status: Never Used  Substance Use Topics   Alcohol use: Not Currently   Drug use: Never       OPHTHALMIC EXAM:  Base Eye Exam     Visual Acuity (Snellen - Linear)       Right Left   Dist cc 20/60 -1 20/150 -1   Dist ph cc 20/50 -2 20/100 -2    Correction: Glasses         Tonometry (Tonopen, 1:28 PM)       Right Left   Pressure 13 12         Pupils       Dark Light Shape React APD   Right 3 2 Round Brisk None   Left 3 2 Round Brisk None         Visual Fields (Counting fingers)       Left Right    Full Full         Extraocular Movement       Right Left    Full, Ortho Full, Ortho         Neuro/Psych     Oriented x3: Yes   Mood/Affect: Normal         Dilation     Both eyes: 1.0% Mydriacyl, 2.5% Phenylephrine @ 1:26 PM           Slit Lamp and Fundus Exam     Slit Lamp Exam       Right Left   Lids/Lashes  Dermatochalasis - upper lid,  Meibomian gland dysfunction, +Scurf Dermatochalasis - upper lid, Meibomian gland dysfunction, +Scurf   Conjunctiva/Sclera White and quiet White and quiet   Cornea trace PEE, mild tear film debris 1+PEE, mild tear film debris   Anterior Chamber deep and clear deep and clear   Iris Round and dilated, No NVI Round and dilated, No NVI   Lens Clear Clear   Anterior Vitreous Vitreous syneresis mild syneresis         Fundus Exam       Right Left   Disc Pink and Sharp, no heme Pink and sharp, fine NVD -- regressed, 360 exudates   C/D Ratio 0.2 0.2   Macula Blunted foveal reflex, severe edema with severe exudation, scattered DBH -- improved Blunted foveal reflex, severe edema and exudation, scattered DBH -- improving   Vessels attenuated, Tortuous, +NVE greatest along inferior arcades -- improving, +Sheathing -- improving attenuated, Tortuous, +NV, +Sheathing of arterioles   Periphery Attached, scattered MA/DBH, heavy exudates greatest posteriorly Attached, severe exudation posteriorly, scattered MA / DBH           Refraction     Wearing Rx       Sphere Cylinder Axis Add   Right -2.50 +1.00 049 +1.00   Left -0.75 +1.25 178 +1.00            IMAGING AND PROCEDURES  Imaging and Procedures for 05/20/2023  OCT, Retina - OU - Both Eyes       Right Eye Quality was good. Central Foveal Thickness: 350. Progression has improved. Findings include no SRF, abnormal foveal contour, subretinal hyper-reflective material, intraretinal hyper-reflective material, intraretinal fluid, vitreomacular adhesion (Interval improvement in IRF, IRHM and [facility]).   Left Eye Quality was good. Central Foveal Thickness: 379. Progression has improved. Findings include no SRF, abnormal foveal contour, subretinal hyper-reflective material, intraretinal hyper-reflective material, epiretinal membrane, intraretinal fluid, lamellar hole, macular pucker (Persistent edema, IRHM and  SRHM-slightly improved, +ERM with pucker).   Notes *Images captured and stored on drive  Diagnosis / Impression:  OD: Interval improvement in IRF, IRHM and SRHM OS: Persistent edema, IRHM and SRHM--slightly improved, +ERM with pucker  Clinical management:  See below  Abbreviations: NFP - Normal foveal profile. CME - cystoid macular edema. PED - pigment epithelial detachment. IRF - intraretinal fluid. SRF - subretinal fluid. EZ - ellipsoid zone. ERM - epiretinal membrane. ORA - outer retinal atrophy. ORT - outer retinal tubulation. SRHM - subretinal hyper-reflective material. IRHM - intraretinal hyper-reflective material      Intravitreal Injection, Pharmacologic Agent - OD - Right Eye       Time Out 05/20/2023. 1:47 PM. Confirmed correct patient, procedure, site, and patient consented.   Anesthesia Topical anesthesia was used. Anesthetic medications included Lidocaine 2%, Proparacaine 0.5%.   Procedure Preparation included 5% betadine to ocular surface, eyelid speculum. A (32g) needle was used.   Injection: 2 mg aflibercept 2 MG/0.05ML   Route: Intravitreal, Site: Right Eye   NDC: L6038910, Lot: [id number], Expiration date: 07/21/2024, Waste: 0 mL   Post-op Post injection exam found visual acuity of at least counting fingers. The patient tolerated the procedure well. There were no complications. The patient received written and verbal post procedure care education.      Intravitreal Injection, Pharmacologic Agent - OS - Left Eye       Time Out 05/20/2023. 1:47 PM. Confirmed correct patient, procedure, site, and patient consented.   Anesthesia Topical anesthesia was used. Anesthetic medications included Lidocaine 2%, Proparacaine 0.5%.   Procedure Preparation  included 5% betadine to ocular surface, eyelid speculum. A (32g) needle was used.   Injection: 2 mg aflibercept 2 MG/0.05ML   Route: Intravitreal, Site: Left Eye   NDC: L6038910, Lot: [id number],  Expiration date: 08/21/2024, Waste: 0 mL   Post-op Post injection exam found visual acuity of at least counting fingers. The patient tolerated the procedure well. There were no complications. The patient received written and verbal post procedure care education.            ASSESSMENT/PLAN:    ICD-10-CM   1. Proliferative diabetic retinopathy of both eyes with macular edema associated with type 2 diabetes mellitus (HCC)  E11.3513 OCT, Retina - OU - Both Eyes    Intravitreal Injection, Pharmacologic Agent - OD - Right Eye    Intravitreal Injection, Pharmacologic Agent - OS - Left Eye    aflibercept (EYLEA) SOLN 2 mg    aflibercept (EYLEA) SOLN 2 mg    2. Current use of insulin (HCC)  Z79.4     3. Long term (current) use of oral hypoglycemic drugs  Z79.84     4. Hyperlipidemia associated with type 2 diabetes mellitus (HCC)  E11.69    E78.5     5. Essential hypertension  I10     6. Hypertensive retinopathy of both eyes  H35.033      1-3. Proliferative diabetic retinopathy, both eyes  - A1c 7.4 on 08.11.23  - s/p IVA OS #1 (02.22.24), #2 (03.22.24) #3 (04.19.24)  -s/p IVA OD #1 (02.23.24), #2 (03.22.24) #3 (04.19.24), #4 (05.17.24), #5 (06.18.24) #6(11.08.24)  - s/p IVE OD #1 (06.18.24), #2 (07.16.24), #3 (08.13.24), #4 (09.10.24), #5 (10.08.24), #6 (11.08.24), #7 (12.06.24), #8 (01.03.25), #9 (01.31.25), #10 (02.28.25) - s/p IVE OS #1 (05.17.24), #2 (06.18.24), #3 (7.16.24), #4 (08.13.24), #5 (09.10.24), #6 (10.08.24), #7 (11.08.24), #8 (12.06.24), #9 (01.03.25), #10 (01.31.25), #11 (02.28.25) - exam shows severe edema with scattered MA and DBH, but significant lipid component to edema OU - FA (02.21.24) shows OD: Large cluster of leaking NV along IT arcades, scattered patches of vascular non-perfusion; OS: +NVD and NVE along proximal arcades - BCVA OD stable at 20/50; OS improved to 20/100 from 20/150 - OCT shows OD: Interval improvement in IRF, IRHM and SRHM; OS: Persistent  edema, IRHM and SRHM, +ERM with pucker at 4 wks - recommend IVE OU (OD #11 and OS #12) today, 03.28.25 w/ f/u in 4 wks - pt wishes to proceed - RBA of procedure discussed, questions answered - IVE informed consent obtained and signed, 05.17.24 (OU) - see procedure note - Vabysmo is approved for 2025, but waiting for Vabysmo co-pay to be approved - f/u 4 weeks -- DFE/OCT, possible injection OU -- possible FA  4. Hyperlipidemia  - review of labs shows severely elevated cholesterol and lipids  - 08.11.23:  Trig  785 Chol  598  LDL  410 - 01.17.25: Trig 335   Chol 279   LDL 173 - likely contributing to severe edema and exudates  5,6. Hypertensive retinopathy OU - discussed importance of tight BP control - continue to monitor   Ophthalmic Meds Ordered this visit:  Meds ordered this encounter  Medications   aflibercept (EYLEA) SOLN 2 mg   aflibercept (EYLEA) SOLN 2 mg     Return in about 4 weeks (around 06/17/2023) for f/u PDR OU, DFE, OCT, Possible Injxn.  There are no Patient Instructions on file for this visit.  This document serves as a record of services personally performed by Isaias Cowman.  Vanessa Barbara, MD, PhD. It was created on their behalf by Glee Arvin. Manson Passey, OA an ophthalmic technician. The creation of this record is the provider's dictation and/or activities during the visit.    Electronically signed by: Glee Arvin. Manson Passey, OA 05/22/23 3:06 PM  Karie Chimera, M.D., Ph.D. Diseases & Surgery of the Retina and Vitreous [facility] 05/20/2023   I have reviewed the above documentation for accuracy and completeness, and I agree with the above. Karie Chimera, M.D., Ph.D. 05/22/23 3:13 PM   Abbreviations: M myopia (nearsighted); A astigmatism; H hyperopia (farsighted); P presbyopia; Mrx spectacle prescription;  CTL contact lenses; OD right eye; OS left eye; OU both eyes  XT exotropia; ET esotropia; PEK punctate epithelial keratitis; PEE punctate epithelial  erosions; DES dry eye syndrome; MGD meibomian gland dysfunction; ATs artificial tears; PFAT's preservative free artificial tears; NSC nuclear sclerotic cataract; PSC posterior subcapsular cataract; ERM epi-retinal membrane; PVD posterior vitreous detachment; RD retinal detachment; DM diabetes mellitus; DR diabetic retinopathy; NPDR non-proliferative diabetic retinopathy; PDR proliferative diabetic retinopathy; CSME clinically significant macular edema; DME diabetic macular edema; dbh dot blot hemorrhages; CWS cotton wool spot; POAG primary open angle glaucoma; C/D cup-to-disc ratio; HVF humphrey visual field; GVF goldmann visual field; OCT optical coherence tomography; IOP intraocular pressure; BRVO Branch retinal vein occlusion; CRVO central retinal vein occlusion; CRAO central retinal artery occlusion; BRAO branch retinal artery occlusion; RT retinal tear; SB scleral buckle; PPV pars plana vitrectomy; VH Vitreous hemorrhage; PRP panretinal laser photocoagulation; IVK intravitreal kenalog; VMT vitreomacular traction; MH Macular hole;  NVD neovascularization of the disc; NVE neovascularization elsewhere; AREDS age related eye disease study; ARMD age related macular degeneration; POAG primary open angle glaucoma; EBMD epithelial/anterior basement membrane dystrophy; ACIOL anterior chamber intraocular lens; IOL intraocular lens; PCIOL posterior chamber intraocular lens; Phaco/IOL phacoemulsification with intraocular lens placement; PRK photorefractive keratectomy; LASIK laser assisted in situ keratomileusis; HTN hypertension; DM diabetes mellitus; COPD chronic obstructive pulmonary disease

## 2023-05-20 ENCOUNTER — Encounter (INDEPENDENT_AMBULATORY_CARE_PROVIDER_SITE_OTHER): Payer: Self-pay | Admitting: Ophthalmology

## 2023-05-20 ENCOUNTER — Ambulatory Visit (INDEPENDENT_AMBULATORY_CARE_PROVIDER_SITE_OTHER): Payer: BC Managed Care – PPO | Admitting: Ophthalmology

## 2023-05-20 DIAGNOSIS — Z794 Long term (current) use of insulin: Secondary | ICD-10-CM | POA: Diagnosis not present

## 2023-05-20 DIAGNOSIS — I10 Essential (primary) hypertension: Secondary | ICD-10-CM | POA: Diagnosis not present

## 2023-05-20 DIAGNOSIS — E113513 Type 2 diabetes mellitus with proliferative diabetic retinopathy with macular edema, bilateral: Secondary | ICD-10-CM | POA: Diagnosis not present

## 2023-05-20 DIAGNOSIS — E1169 Type 2 diabetes mellitus with other specified complication: Secondary | ICD-10-CM

## 2023-05-20 DIAGNOSIS — Z7984 Long term (current) use of oral hypoglycemic drugs: Secondary | ICD-10-CM

## 2023-05-20 DIAGNOSIS — H35033 Hypertensive retinopathy, bilateral: Secondary | ICD-10-CM

## 2023-05-20 MED ORDER — AFLIBERCEPT 2MG/0.05ML IZ SOLN FOR KALEIDOSCOPE
2.0000 mg | INTRAVITREAL | Status: AC | PRN
  Administered 2023-05-20: 2 mg via INTRAVITREAL

## 2023-05-22 ENCOUNTER — Encounter (INDEPENDENT_AMBULATORY_CARE_PROVIDER_SITE_OTHER): Payer: Self-pay | Admitting: Ophthalmology

## 2023-05-27 ENCOUNTER — Encounter: Payer: Self-pay | Admitting: Neurology

## 2023-05-30 ENCOUNTER — Ambulatory Visit
Admission: RE | Admit: 2023-05-30 | Discharge: 2023-05-30 | Disposition: A | Discharge status: Home / Self Care | Source: Ambulatory Visit | Attending: Neurology

## 2023-05-30 DIAGNOSIS — R251 Tremor, unspecified: Secondary | ICD-10-CM | POA: Diagnosis not present

## 2023-06-01 ENCOUNTER — Encounter (HOSPITAL_COMMUNITY)
Admission: RE | Admit: 2023-06-01 | Discharge: 2023-06-01 | Disposition: A | Discharge status: Home / Self Care | Source: Ambulatory Visit | Attending: Neurology | Admitting: Neurology

## 2023-06-01 ENCOUNTER — Ambulatory Visit (HOSPITAL_COMMUNITY)
Admission: RE | Admit: 2023-06-01 | Discharge: 2023-06-01 | Disposition: A | Discharge status: Home / Self Care | Source: Ambulatory Visit | Attending: Neurology | Admitting: Neurology

## 2023-06-01 DIAGNOSIS — R251 Tremor, unspecified: Secondary | ICD-10-CM | POA: Diagnosis not present

## 2023-06-01 MED ORDER — POTASSIUM IODIDE (ANTIDOTE) 130 MG PO TABS
ORAL_TABLET | ORAL | Status: AC
  Filled 2023-06-01: qty 1

## 2023-06-01 MED ORDER — IOFLUPANE I 123 185 MBQ/2.5ML IV SOLN
4.4400 | Freq: Once | INTRAVENOUS | Status: AC | PRN
  Administered 2023-06-01: 4.44 via INTRAVENOUS
  Filled 2023-06-01: qty 5

## 2023-06-02 ENCOUNTER — Telehealth: Payer: Self-pay

## 2023-06-02 NOTE — Progress Notes (Signed)
 Patient advised.

## 2023-06-02 NOTE — Telephone Encounter (Signed)
 Per patient he stop the Reglan a week ago.  Front desk please call patient reschedule for six months.

## 2023-06-02 NOTE — Telephone Encounter (Signed)
-----   Message from Cira Servant sent at 06/02/2023  7:46 AM EDT ----- The DatScan is normal which is highly suggestive that he doesn't have Parkinson's disease and that this may be just due to the metoclopramide (Reglan).  He will need to stop the Reglan.  I want to see him in 6 months after stopping Reglan for re-evaluation.

## 2023-06-16 ENCOUNTER — Encounter: Payer: Self-pay | Admitting: Internal Medicine

## 2023-06-16 ENCOUNTER — Ambulatory Visit: Attending: Internal Medicine | Admitting: Internal Medicine

## 2023-06-16 VITALS — BP 104/64 | HR 101 | Ht 68.0 in | Wt 165.0 lb

## 2023-06-16 DIAGNOSIS — I739 Peripheral vascular disease, unspecified: Secondary | ICD-10-CM

## 2023-06-16 DIAGNOSIS — E781 Pure hyperglyceridemia: Secondary | ICD-10-CM

## 2023-06-16 DIAGNOSIS — I10 Essential (primary) hypertension: Secondary | ICD-10-CM

## 2023-06-16 DIAGNOSIS — E785 Hyperlipidemia, unspecified: Secondary | ICD-10-CM

## 2023-06-16 NOTE — Progress Notes (Signed)
 Cardiology Office Note  Date: 06/16/2023   ID: Jose Koch, DOB 16-Jun-1970, MRN [id number]  PCP:  Omie Bickers, MD  Cardiologist:  None Electrophysiologist:  None   History of Present Illness: Jose Koch is a 53 y.o. male known to have moderate lower EXTR PAD, s/p R ICA stent, hypertriglyceridemia, hyperlipidemia, DM 2 was referred to cardiology clinic for the management of HLD.  Accompanied by mother-in-law.  Lipid panel from January 2025 reviewed, LDL 173 and TG 335.  Currently on atorvastatin 40 mg nightly, fenofibrate 160 mg once daily.  His mother was adopted and he has no relation to his bilateral father.  Unknown family history.  Genetic testing for lipid disorders was sent out by his PCP.  He does not have any symptoms, no angina or DOE.  No dizziness, palpitations, syncope, leg swelling.  Mother-in-law reported that his PCP's office told them that he had a bulge in his heart.  I do not find an echocardiogram on file.  Past Medical History:  Diagnosis Date   COVID    COVID 02/2020   very sick - admitted to the hospital   Diabetes mellitus without complication (HCC)    GERD (gastroesophageal reflux disease)    Headache    High cholesterol    History of kidney stones    Hypertension    was diagnosed 2 years ago, never treated and states BP is always - documented 07/13/21   Ichthyosis vulgaris    Pneumonia    Sleep apnea    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    Past Surgical History:  Procedure Laterality Date   IR ANGIO INTRA EXTRACRAN SEL COM CAROTID INNOMINATE BILAT MOD SED  04/17/2021   IR ANGIO INTRA EXTRACRAN SEL INTERNAL CAROTID UNI R MOD SED  07/17/2021   IR ANGIO VERTEBRAL SEL VERTEBRAL BILAT MOD SED  04/17/2021   IR CT HEAD LTD  07/15/2021   IR INTRA CRAN STENT  07/15/2021   IR RADIOLOGIST EVAL & MGMT  03/05/2021   IR RADIOLOGIST EVAL & MGMT  05/04/2021   IR RADIOLOGIST EVAL & MGMT  07/31/2021   IR US  GUIDE VASC ACCESS RIGHT  04/17/2021   IR US  GUIDE  VASC ACCESS RIGHT  07/15/2021   RADIOLOGY WITH ANESTHESIA N/A 07/15/2021   Procedure: STENTING;  Surgeon: Luellen Sages, MD;  Location: [facility] OR;  Service: Radiology;  Laterality: N/A;   TONSILLECTOMY     wisdom teeth removal      Current Outpatient Medications  Medication Sig Dispense Refill   aspirin  EC 81 MG tablet Take 81 mg by mouth daily. Swallow whole.     atorvastatin (LIPITOR) 40 MG tablet Take 40 mg by mouth daily.     blood glucose meter kit and supplies KIT Dispense based on patient and insurance preference (something with affordable test strips). Use to check glucose twice daily as directed. 1 each 0   fenofibrate 160 MG tablet Take 160 mg by mouth daily.     ferrous sulfate 325 (65 FE) MG tablet Take by mouth.     fluticasone (FLONASE) 50 MCG/ACT nasal spray Place 2 sprays into both nostrils daily.     folic acid  (FOLVITE ) 1 MG tablet TAKE 1 TABLET BY MOUTH EVERY DAY 90 tablet 1   glucose blood (ACCU-CHEK GUIDE) test strip Use as instructed to monitor glucose twice daily, before breakfast and before bed 100 each 12   Insulin  Glargine (BASAGLAR  KWIKPEN) 100 UNIT/ML Inject 30 Units into the  skin at bedtime.     Insulin  Pen Needle 31G X 5 MM MISC 1 Units by Does not apply route daily. 100 each 1   JANUVIA 100 MG tablet Take 100 mg by mouth daily.     JARDIANCE 25 MG TABS tablet Take 25 mg by mouth daily.     levETIRAcetam  (KEPPRA ) 500 MG tablet Take 1 tablet (500 mg total) by mouth 2 (two) times daily. 180 tablet 3   Misc Natural Products (OSTEO BI-FLEX ADV DOUBLE ST PO) Take 1 tablet by mouth.     OneTouch Delica Lancets 33G MISC PLEASE SEE ATTACHED FOR DETAILED DIRECTIONS     pantoprazole  (PROTONIX ) 40 MG tablet TAKE 1 TABLET BY MOUTH 2 TIMES DAILY BEFORE A MEAL 30 MINUTES BEFORE BREAKFAST AND DINNER 60 tablet 3   ticagrelor  (BRILINTA ) 90 MG TABS tablet Take 1 tablet (90 mg total) by mouth 2 (two) times daily. 60 tablet 5   No current facility-administered medications for this  visit.   Allergies:  Reglan  [metoclopramide ]   Social History: The patient  reports that he has never smoked. He has never used smokeless tobacco. He reports that he does not currently use alcohol. He reports that he does not use drugs.   Family History: The patient's family history includes Bladder Cancer (age of onset: 61) in his mother; Bursitis in his mother.   ROS:  Please see the history of present illness. Otherwise, complete review of systems is positive for none.  All other systems are reviewed and negative.   Physical Exam: VS:  BP 104/64 (BP Location: Left Arm, Patient Position: Sitting, Cuff Size: Normal)   Pulse (!) 101   Ht 5\' 8"  (1.727 m)   Wt 165 lb (74.8 kg)   SpO2 99%   BMI 25.09 kg/m , BMI Body mass index is 25.09 kg/m.  Wt Readings from Last 3 Encounters:  06/16/23 165 lb (74.8 kg)  05/13/23 161 lb (73 kg)  05/05/23 163 lb 12.8 oz (74.3 kg)    General: Patient appears comfortable at rest. HEENT: Conjunctiva and lids normal, oropharynx clear with moist mucosa. Neck: Supple, no elevated JVP or carotid bruits, no thyromegaly. Lungs: Clear to auscultation, nonlabored breathing at rest. Cardiac: Regular rate and rhythm, no S3 or significant systolic murmur, no pericardial rub. Abdomen: Soft, nontender, no hepatomegaly, bowel sounds present, no guarding or rebound. Extremities: No pitting edema, distal pulses 2+. Skin: Warm and dry. Musculoskeletal: No kyphosis. Neuropsychiatric: Alert and oriented x3, affect grossly appropriate.  Recent Labwork: 04/01/2023: ALT 27; AST 23; BUN 31; Creatinine, Ser 2.08; Hemoglobin 10.0; Platelets 161; Potassium 4.7; Sodium 135     Component Value Date/Time   CHOL 492 (H) 04/14/2020 1551   TRIG 747 (H) 04/14/2020 1551   HDL 34 (L) 04/14/2020 1551   CHOLHDL 14.5 04/14/2020 1551   VLDL UNABLE TO CALCULATE IF TRIGLYCERIDE OVER 400 mg/dL 29/56/2130 8657   LDLCALC UNABLE TO CALCULATE IF TRIGLYCERIDE OVER 400 mg/dL 84/69/6295 2841    LDLDIRECT 106.3 (H) 04/14/2020 1551     Assessment and Plan:  Hypertriglyceridemia, hyperlipidemia: Lipid panel from January 2025 showed LDL 173, TG 335.  Improved compared to TG 747 in 2022.  Currently on atorvastatin 40 mg nightly and fenofibrate 160 mg once daily, will continue these medications.  Obtain fasting lipid panel.  If TG is more than 150, will start Vascepa 2 g twice daily.  Unknown family history.  Genetic testing for lipid disorders is sent out by PCPs office.  Will obtain  the report, was told it will take couple of months.  Follow-up in 3 months, telephone visit.  He probably might benefit from referral to lipid clinic with Dr. Maximo Spar if he is noted to have lipid disorder with genetic component.  HbA1c well-controlled.  S/p R ICA in 2023: Currently on DAPT, aspirin  81 mg once daily and Brilinta  90 mg a day.  Follow-up with neurology for DAPT recommendations.  Moderate lower extremity PAD: He has bilateral lower EXTR claudication, L>R.  Follows up with vascular surgery.  Continue aspirin  81 mg once daily, atorvastatin 40 mg nightly and fenofibrate 160 mg once daily.  Follows up with vascular surgery.  Bulge in the heart: Patient was told he had a bulge in the heart by his PCPs office.  I did not find echocardiogram report.  Will obtain echocardiogram.    Medication Adjustments/Labs and Tests Ordered: Current medicines are reviewed at length with the patient today.  Concerns regarding medicines are outlined above.    Disposition:  Follow up 3 months, telephone visit.  Signed, Annette Bertelson Beauford Bounds, MD, 06/16/2023 11:52 AM    [facility] [address], [place], [place] [address]

## 2023-06-16 NOTE — Progress Notes (Signed)
 [facility] - Clinic Note  06/17/2023     CHIEF COMPLAINT Patient presents for Retina Follow Up   HISTORY OF PRESENT ILLNESS: Jose Koch is a 53 y.o. male who presents to the clinic today for:   HPI     Retina Follow Up   Patient presents with  Diabetic Retinopathy.  In both eyes.  This started 4 weeks ago.  Duration of 4 weeks.  Since onset it is stable.  I, the attending physician,  performed the HPI with the patient and updated documentation appropriately.        Comments   4 week retina follow up PDR OU and IVE OU pt is reporting no vision changes noticed he denies any flashes or floaters last reading 203      Last edited by Ronelle Coffee, MD on 06/17/2023  2:53 PM.     Pt is still waiting on genetic testing results to come in  Referring physician: Omie Bickers, MD [address] [place],  [place] [address]  HISTORICAL INFORMATION:   Selected notes from the MEDICAL RECORD NUMBER Referred by Dr. Donalynn Fry LEE:  Ocular Hx- PMH-    CURRENT MEDICATIONS: No current outpatient medications on file. (Ophthalmic Drugs)   No current facility-administered medications for this visit. (Ophthalmic Drugs)   Current Outpatient Medications (Other)  Medication Sig   aspirin  EC 81 MG tablet Take 81 mg by mouth daily. Swallow whole.   atorvastatin (LIPITOR) 40 MG tablet Take 40 mg by mouth daily.   blood glucose meter kit and supplies KIT Dispense based on patient and insurance preference (something with affordable test strips). Use to check glucose twice daily as directed.   fenofibrate 160 MG tablet Take 160 mg by mouth daily.   ferrous sulfate 325 (65 FE) MG tablet Take by mouth.   fluticasone (FLONASE) 50 MCG/ACT nasal spray Place 2 sprays into both nostrils daily.   folic acid  (FOLVITE ) 1 MG tablet TAKE 1 TABLET BY MOUTH EVERY DAY   glucose blood (ACCU-CHEK GUIDE) test strip Use as instructed to monitor glucose twice daily, before breakfast and  before bed   Insulin  Glargine (BASAGLAR  KWIKPEN) 100 UNIT/ML Inject 30 Units into the skin at bedtime.   Insulin  Pen Needle 31G X 5 MM MISC 1 Units by Does not apply route daily.   JANUVIA 100 MG tablet Take 100 mg by mouth daily.   JARDIANCE 25 MG TABS tablet Take 25 mg by mouth daily.   levETIRAcetam  (KEPPRA ) 500 MG tablet Take 1 tablet (500 mg total) by mouth 2 (two) times daily.   Misc Natural Products (OSTEO BI-FLEX ADV DOUBLE ST PO) Take 1 tablet by mouth.   OneTouch Delica Lancets 33G MISC PLEASE SEE ATTACHED FOR DETAILED DIRECTIONS   pantoprazole  (PROTONIX ) 40 MG tablet TAKE 1 TABLET BY MOUTH 2 TIMES DAILY BEFORE A MEAL 30 MINUTES BEFORE BREAKFAST AND DINNER   ticagrelor  (BRILINTA ) 90 MG TABS tablet Take 1 tablet (90 mg total) by mouth 2 (two) times daily.   No current facility-administered medications for this visit. (Other)   REVIEW OF SYSTEMS: ROS   Positive for: Cardiovascular, Eyes, Respiratory Negative for: Constitutional, Gastrointestinal, Neurological, Skin, Genitourinary, Musculoskeletal, HENT, Endocrine, Psychiatric, Allergic/Imm, Heme/Lymph Last edited by Alise Appl, COT on 06/17/2023  1:13 PM.       ALLERGIES Allergies  Allergen Reactions   Reglan  [Metoclopramide ] Other (See Comments)    Tremors, parkinson like symptoms     PAST MEDICAL HISTORY  Past Medical History:  Diagnosis Date   COVID    COVID 02/2020   very sick - admitted to the hospital   Diabetes mellitus without complication (HCC)    GERD (gastroesophageal reflux disease)    Headache    High cholesterol    History of kidney stones    Hypertension    was diagnosed 2 years ago, never treated and states BP is always - documented 07/13/21   Ichthyosis vulgaris    Pneumonia    Sleep apnea    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   Past Surgical History:  Procedure Laterality Date   IR ANGIO INTRA EXTRACRAN SEL COM CAROTID INNOMINATE BILAT MOD SED  04/17/2021   IR ANGIO INTRA  EXTRACRAN SEL INTERNAL CAROTID UNI R MOD SED  07/17/2021   IR ANGIO VERTEBRAL SEL VERTEBRAL BILAT MOD SED  04/17/2021   IR CT HEAD LTD  07/15/2021   IR INTRA CRAN STENT  07/15/2021   IR RADIOLOGIST EVAL & MGMT  03/05/2021   IR RADIOLOGIST EVAL & MGMT  05/04/2021   IR RADIOLOGIST EVAL & MGMT  07/31/2021   IR US  GUIDE VASC ACCESS RIGHT  04/17/2021   IR US  GUIDE VASC ACCESS RIGHT  07/15/2021   RADIOLOGY WITH ANESTHESIA N/A 07/15/2021   Procedure: STENTING;  Surgeon: Luellen Sages, MD;  Location: [facility] OR;  Service: Radiology;  Laterality: N/A;   TONSILLECTOMY     wisdom teeth removal     FAMILY HISTORY Family History  Problem Relation Age of Onset   Bursitis Mother    Bladder Cancer Mother 83       smoker   SOCIAL HISTORY Social History   Tobacco Use   Smoking status: Never   Smokeless tobacco: Never  Vaping Use   Vaping status: Never Used  Substance Use Topics   Alcohol use: Not Currently   Drug use: Never       OPHTHALMIC EXAM:  Base Eye Exam     Visual Acuity (Snellen - Linear)       Right Left   Dist cc 20/70 +1 20/150 -1   Dist ph cc NI 20/150    Correction: Glasses         Tonometry (Tonopen, 1:22 PM)       Right Left   Pressure 11 14         Pupils       Pupils Dark Light Shape React APD   Right PERRL 3 2 Round Brisk None   Left PERRL 3 2 Round Brisk None         Visual Fields       Left Right    Full Full         Extraocular Movement       Right Left    Full, Ortho Full, Ortho         Neuro/Psych     Oriented x3: Yes   Mood/Affect: Normal         Dilation     Both eyes: 2.5% Phenylephrine  @ 1:22 PM           Slit Lamp and Fundus Exam     Slit Lamp Exam       Right Left   Lids/Lashes Dermatochalasis - upper lid, Meibomian gland dysfunction, +Scurf Dermatochalasis - upper lid, Meibomian gland dysfunction, +Scurf   Conjunctiva/Sclera White and quiet White and quiet   Cornea trace PEE, mild tear film debris 1+PEE, mild  tear film  debris   Anterior Chamber deep and clear deep and clear   Iris Round and dilated, No NVI Round and dilated, No NVI   Lens Clear Clear   Anterior Vitreous Vitreous syneresis mild syneresis         Fundus Exam       Right Left   Disc Pink and Sharp, no heme Pink and sharp, fine NVD -- regressed, 360 exudates   C/D Ratio 0.2 0.2   Macula Blunted foveal reflex, severe edema with severe exudation, scattered DBH -- slightly improved Blunted foveal reflex, severe edema and exudation, scattered DBH -- improving   Vessels attenuated, Tortuous, +NVE greatest along inferior arcades -- improving, +Sheathing -- improving attenuated, Tortuous, +NV, +Sheathing of arterioles   Periphery Attached, scattered MA/DBH, heavy exudates greatest posteriorly Attached, severe exudation posteriorly, scattered MA / DBH           Refraction     Wearing Rx       Sphere Cylinder Axis Add   Right -2.50 +1.00 049 +1.00   Left -0.75 +1.25 178 +1.00            IMAGING AND PROCEDURES  Imaging and Procedures for 06/17/2023  OCT, Retina - OU - Both Eyes       Right Eye Quality was good. Central Foveal Thickness: 354. Progression has improved. Findings include no SRF, abnormal foveal contour, subretinal hyper-reflective material, intraretinal hyper-reflective material, intraretinal fluid, vitreomacular adhesion (Mild interval improvement in IRF, IRHM and [facility]).   Left Eye Quality was good. Central Foveal Thickness: 381. Progression has improved. Findings include no SRF, abnormal foveal contour, subretinal hyper-reflective material, intraretinal hyper-reflective material, epiretinal membrane, intraretinal fluid, lamellar hole, macular pucker (Persistent edema, IRHM and SRHM -- slightly improved, +ERM with pucker).   Notes *Images captured and stored on drive  Diagnosis / Impression:  OD: mild interval improvement in IRF, IRHM and SRHM OS: Persistent edema, IRHM and SRHM -- slightly improved,  +ERM with pucker  Clinical management:  See below  Abbreviations: NFP - Normal foveal profile. CME - cystoid macular edema. PED - pigment epithelial detachment. IRF - intraretinal fluid. SRF - subretinal fluid. EZ - ellipsoid zone. ERM - epiretinal membrane. ORA - outer retinal atrophy. ORT - outer retinal tubulation. SRHM - subretinal hyper-reflective material. IRHM - intraretinal hyper-reflective material      Intravitreal Injection, Pharmacologic Agent - OD - Right Eye       Time Out 06/17/2023. 1:46 PM. Confirmed correct patient, procedure, site, and patient consented.   Anesthesia Topical anesthesia was used. Anesthetic medications included Lidocaine  2%, Proparacaine 0.5%.   Procedure Preparation included 5% betadine to ocular surface, eyelid speculum. A supplied (32g) needle was used.   Injection: 6 mg faricimab -svoa 6 MG/0.05ML   Route: Intravitreal, Site: Right Eye   NDC: 64403-474-25, Lot: [id number], Expiration date: 06/20/2024, Waste: 0 mL   Post-op Post injection exam found visual acuity of at least counting fingers. The patient tolerated the procedure well. There were no complications. The patient received written and verbal post procedure care education.      Intravitreal Injection, Pharmacologic Agent - OS - Left Eye       Time Out 06/17/2023. 1:47 PM. Confirmed correct patient, procedure, site, and patient consented.   Anesthesia Topical anesthesia was used. Anesthetic medications included Lidocaine  2%, Proparacaine 0.5%.   Procedure Preparation included 5% betadine to ocular surface, eyelid speculum. A (32g) needle was used.   Injection: 6 mg faricimab -svoa 6 MG/0.05ML   Route: Intravitreal, Site:  Left Eye   NDC: G645945, Lot: [id number], Expiration date: 06/20/2024, Waste: 0 mL   Post-op Post injection exam found visual acuity of at least counting fingers. The patient tolerated the procedure well. There were no complications. The patient received  written and verbal post procedure care education.             ASSESSMENT/PLAN:    ICD-10-CM   1. Proliferative diabetic retinopathy of both eyes with macular edema associated with type 2 diabetes mellitus (HCC)  E11.3513 OCT, Retina - OU - Both Eyes    Intravitreal Injection, Pharmacologic Agent - OD - Right Eye    Intravitreal Injection, Pharmacologic Agent - OS - Left Eye    faricimab-svoa (VABYSMO) 6mg /0.30mL intravitreal injection    faricimab-svoa (VABYSMO) 6mg /0.98mL intravitreal injection    2. Current use of insulin  (HCC)  Z79.4     3. Long term (current) use of oral hypoglycemic drugs  Z79.84     4. Hyperlipidemia associated with type 2 diabetes mellitus (HCC)  E11.69    E78.5     5. Essential hypertension  I10     6. Hypertensive retinopathy of both eyes  H35.033       1-3. Proliferative diabetic retinopathy, both eyes  - A1c 7.4 on 08.11.23  - s/p IVA OS #1 (02.22.24), #2 (03.22.24), #3 (04.19.24)  -s/p IVA OD #1 (02.23.24), #2 (03.22.24) #3 (04.19.24), #4 (05.17.24), #5 (06.18.24), #6 (11.08.24) ==================================================  - s/p IVE OD #1 (06.18.24), #2 (07.16.24), #3 (08.13.24), #4 (09.10.24), #5 (10.08.24), #6 (11.08.24), #7 (12.06.24), #8 (01.03.25), #9 (01.31.25), #10 (02.28.25), #11 (03.28.25) - s/p IVE OS #1 (05.17.24), #2 (06.18.24), #3 (7.16.24), #4 (08.13.24), #5 (09.10.24), #6 (10.08.24), #7 (11.08.24), #8 (12.06.24), #9 (01.03.25), #10 (01.31.25), #11 (02.28.25), #12 (03.28.25) ===================================================== - exam shows severe edema with scattered MA and DBH, but significant lipid component to edema OU - FA (02.21.24) shows OD: Large cluster of leaking NV along IT arcades, scattered patches of vascular non-perfusion; OS: +NVD and NVE along proximal arcades - BCVA OD 20/70 from 20/50; OS 20/150 from 20/100 - OCT shows OD: mild interval improvement in IRF, IRHM and SRHM; OS: Persistent edema, IRHM and SRHM  -- slightly improved, +ERM with pucker at 4 wks **discussed decreased efficacy / resistance to Eylea  and potential benefit of switching medication**  - recommend switching to IVV OU #1 today, 04.25.25 w/ f/u in 4 wks - pt wishes to proceed - RBA of procedure discussed, questions answered - IVE informed consent obtained and signed, 05.17.24 (OU) - IVV informed consent obtained and signed, 04.25.25 (OU) - see procedure note - Eylea  is approved for 2025, Eylea4u approved - Vabysmo is approved for 2025, co-pay program approved - f/u 4 weeks -- DFE/OCT, possible injection OU -- possible FA  4. Hyperlipidemia  - review of labs shows severely elevated cholesterol and lipids  - 08.11.23:  Trig  785 Chol  598  LDL  410 - 01.17.25: Trig 335   Chol 279   LDL 173 - likely contributing to severe edema and exudates  5,6. Hypertensive retinopathy OU - discussed importance of tight BP control - continue to monitor   Ophthalmic Meds Ordered this visit:  Meds ordered this encounter  Medications   faricimab-svoa (VABYSMO) 6mg /0.38mL intravitreal injection   faricimab-svoa (VABYSMO) 6mg /0.17mL intravitreal injection     Return in about 4 weeks (around 07/15/2023) for f/u PDR OU, DFE, OCT, Possible Injxn.  There are no Patient Instructions on file for this visit.  This document serves as  a record of services personally performed by Jeanice Millard, MD, PhD. It was created on their behalf by Morley Arabia. Bevin Bucks, OA an ophthalmic technician. The creation of this record is the provider's dictation and/or activities during the visit.    Electronically signed by: Morley Arabia. Bevin Bucks, OA 06/17/23 2:56 PM  Jeanice Millard, M.D., Ph.D. Diseases & Surgery of the Retina and Vitreous [facility] 06/16/2023   I have reviewed the above documentation for accuracy and completeness, and I agree with the above. Jeanice Millard, M.D., Ph.D. 06/17/23 3:02 PM   Abbreviations: M myopia  (nearsighted); A astigmatism; H hyperopia (farsighted); P presbyopia; Mrx spectacle prescription;  CTL contact lenses; OD right eye; OS left eye; OU both eyes  XT exotropia; ET esotropia; PEK punctate epithelial keratitis; PEE punctate epithelial erosions; DES dry eye syndrome; MGD meibomian gland dysfunction; ATs artificial tears; PFAT's preservative free artificial tears; NSC nuclear sclerotic cataract; PSC posterior subcapsular cataract; ERM epi-retinal membrane; PVD posterior vitreous detachment; RD retinal detachment; DM diabetes mellitus; DR diabetic retinopathy; NPDR non-proliferative diabetic retinopathy; PDR proliferative diabetic retinopathy; CSME clinically significant macular edema; DME diabetic macular edema; dbh dot blot hemorrhages; CWS cotton wool spot; POAG primary open angle glaucoma; C/D cup-to-disc ratio; HVF humphrey visual field; GVF goldmann visual field; OCT optical coherence tomography; IOP intraocular pressure; BRVO Branch retinal vein occlusion; CRVO central retinal vein occlusion; CRAO central retinal artery occlusion; BRAO branch retinal artery occlusion; RT retinal tear; SB scleral buckle; PPV pars plana vitrectomy; VH Vitreous hemorrhage; PRP panretinal laser photocoagulation; IVK intravitreal kenalog; VMT vitreomacular traction; MH Macular hole;  NVD neovascularization of the disc; NVE neovascularization elsewhere; AREDS age related eye disease study; ARMD age related macular degeneration; POAG primary open angle glaucoma; EBMD epithelial/anterior basement membrane dystrophy; ACIOL anterior chamber intraocular lens; IOL intraocular lens; PCIOL posterior chamber intraocular lens; Phaco/IOL phacoemulsification with intraocular lens placement; PRK photorefractive keratectomy; LASIK laser assisted in situ keratomileusis; HTN hypertension; DM diabetes mellitus; COPD chronic obstructive pulmonary disease

## 2023-06-16 NOTE — Patient Instructions (Addendum)
 Medication Instructions:  Your physician recommends that you continue on your current medications as directed. Please refer to the Current Medication list given to you today.  *If you need a refill on your cardiac medications before your next appointment, please call your pharmacy*  Lab Work: Fasting Lipid Panel If you have labs (blood work) drawn today and your tests are completely normal, you will receive your results only by: MyChart Message (if you have MyChart) OR A paper copy in the mail If you have any lab test that is abnormal or we need to change your treatment, we will call you to review the results.  Testing/Procedures: Your physician has requested that you have an echocardiogram. Echocardiography is a painless test that uses sound waves to create images of your heart. It provides your doctor with information about the size and shape of your heart and how well your heart's chambers and valves are working. This procedure takes approximately one hour. There are no restrictions for this procedure. Please do NOT wear cologne, perfume, aftershave, or lotions (deodorant is allowed). Please arrive 15 minutes prior to your appointment time.  Please note: We ask at that you not bring children with you during ultrasound (echo/ vascular) testing. Due to room size and safety concerns, children are not allowed in the ultrasound rooms during exams. Our front office staff cannot provide observation of children in our lobby area while testing is being conducted. An adult accompanying a patient to their appointment will only be allowed in the ultrasound room at the discretion of the ultrasound technician under special circumstances. We apologize for any inconvenience.   Follow-Up: At [facility], you and your health needs are our priority.  As part of our continuing mission to provide you with exceptional heart care, our providers are all part of one team.  This team includes your primary  Cardiologist (physician) and Advanced Practice Providers or APPs (Physician Assistants and Nurse Practitioners) who all work together to provide you with the care you need, when you need it.  Your next appointment:   3 month(s)  Provider:   You may see Vishnu Mallipeddi, MD or one of the following Advanced Practice Providers on your designated Care Team:   [place], PA-C  Scotesia [place], [place] Theotis Flake, [place]     We recommend signing up for the patient portal called "MyChart".  Sign up information is provided on this After Visit Summary.  MyChart is used to connect with patients for Virtual Visits (Telemedicine).  Patients are able to view lab/test results, encounter notes, upcoming appointments, etc.  Non-urgent messages can be sent to your provider as well.   To learn more about what you can do with MyChart, go to [email or web address].   Other Instructions

## 2023-06-17 ENCOUNTER — Ambulatory Visit (INDEPENDENT_AMBULATORY_CARE_PROVIDER_SITE_OTHER): Admitting: Ophthalmology

## 2023-06-17 ENCOUNTER — Encounter (INDEPENDENT_AMBULATORY_CARE_PROVIDER_SITE_OTHER): Payer: Self-pay | Admitting: Ophthalmology

## 2023-06-17 DIAGNOSIS — Z794 Long term (current) use of insulin: Secondary | ICD-10-CM | POA: Diagnosis not present

## 2023-06-17 DIAGNOSIS — Z7984 Long term (current) use of oral hypoglycemic drugs: Secondary | ICD-10-CM | POA: Diagnosis not present

## 2023-06-17 DIAGNOSIS — E1169 Type 2 diabetes mellitus with other specified complication: Secondary | ICD-10-CM

## 2023-06-17 DIAGNOSIS — I10 Essential (primary) hypertension: Secondary | ICD-10-CM

## 2023-06-17 DIAGNOSIS — H35033 Hypertensive retinopathy, bilateral: Secondary | ICD-10-CM

## 2023-06-17 DIAGNOSIS — E113513 Type 2 diabetes mellitus with proliferative diabetic retinopathy with macular edema, bilateral: Secondary | ICD-10-CM | POA: Diagnosis not present

## 2023-06-17 MED ORDER — FARICIMAB-SVOA 6 MG/0.05ML IZ SOSY
6.0000 mg | PREFILLED_SYRINGE | INTRAVITREAL | Status: AC | PRN
  Administered 2023-06-17: 6 mg via INTRAVITREAL

## 2023-06-21 ENCOUNTER — Ambulatory Visit: Payer: 59 | Admitting: Neurology

## 2023-06-28 ENCOUNTER — Telehealth: Payer: Self-pay

## 2023-06-28 DIAGNOSIS — R9389 Abnormal findings on diagnostic imaging of other specified body structures: Secondary | ICD-10-CM

## 2023-06-28 NOTE — Telephone Encounter (Signed)
 Patient advised.

## 2023-06-28 NOTE — Telephone Encounter (Signed)
-----   Message from Nathaniel Bald sent at 06/27/2023  4:07 PM EDT ----- MRI of brain does not reveal anything that would be cause for tremors.  There are some scattered old micro-bleeds. It may be due to thrombocytopenia or sometimes may be due to high blood pressure (which you do not have).  I would just monitor and repeat in a year

## 2023-06-29 ENCOUNTER — Ambulatory Visit: Payer: 59 | Admitting: Neurology

## 2023-06-30 DIAGNOSIS — E1121 Type 2 diabetes mellitus with diabetic nephropathy: Secondary | ICD-10-CM | POA: Diagnosis not present

## 2023-07-05 ENCOUNTER — Ambulatory Visit: Payer: Self-pay | Admitting: Internal Medicine

## 2023-07-06 DIAGNOSIS — I700 Atherosclerosis of aorta: Secondary | ICD-10-CM | POA: Diagnosis not present

## 2023-07-06 DIAGNOSIS — E11319 Type 2 diabetes mellitus with unspecified diabetic retinopathy without macular edema: Secondary | ICD-10-CM | POA: Diagnosis not present

## 2023-07-06 DIAGNOSIS — L409 Psoriasis, unspecified: Secondary | ICD-10-CM | POA: Insufficient documentation

## 2023-07-06 DIAGNOSIS — E1143 Type 2 diabetes mellitus with diabetic autonomic (poly)neuropathy: Secondary | ICD-10-CM | POA: Diagnosis not present

## 2023-07-06 DIAGNOSIS — E7800 Pure hypercholesterolemia, unspecified: Secondary | ICD-10-CM | POA: Diagnosis not present

## 2023-07-12 ENCOUNTER — Other Ambulatory Visit: Payer: Self-pay | Admitting: Physician Assistant

## 2023-07-12 ENCOUNTER — Ambulatory Visit (HOSPITAL_COMMUNITY)
Admission: RE | Admit: 2023-07-12 | Discharge: 2023-07-12 | Disposition: A | Discharge status: Home / Self Care | Source: Ambulatory Visit | Attending: Internal Medicine | Admitting: Internal Medicine

## 2023-07-12 DIAGNOSIS — I7389 Other specified peripheral vascular diseases: Secondary | ICD-10-CM

## 2023-07-12 DIAGNOSIS — I739 Peripheral vascular disease, unspecified: Secondary | ICD-10-CM | POA: Diagnosis not present

## 2023-07-12 LAB — ECHOCARDIOGRAM COMPLETE
Area-P 1/2: 2.91 cm2
Est EF: 45
S' Lateral: 4 cm

## 2023-07-12 MED ORDER — TICAGRELOR 90 MG PO TABS: 90.0000 mg | ORAL_TABLET | Freq: Two times a day (BID) | ORAL | 5 refills | Status: DC

## 2023-07-12 NOTE — Progress Notes (Signed)
*  PRELIMINARY RESULTS* Echocardiogram 2D Echocardiogram has been performed.  Jose Koch 07/12/2023, 3:48 PM

## 2023-07-13 ENCOUNTER — Encounter: Payer: Self-pay | Admitting: Neurology

## 2023-07-13 NOTE — Progress Notes (Addendum)
 [facility] - Clinic Note  07/15/2023     CHIEF COMPLAINT Patient presents for Retina Follow Up   HISTORY OF PRESENT ILLNESS: Jose Koch is a 53 y.o. male who presents to the clinic today for:   HPI     Retina Follow Up   Patient presents with  Diabetic Retinopathy.  In both eyes.  This started 4 weeks ago.  I, the attending physician,  performed the HPI with the patient and updated documentation appropriately.        Comments   Patient here for 4 weeks retina follow up for PDR OU. Patient states vision  seems to be doing alright. No eye pain. Had an EKG done by cardiologist. PCP saw a bulge on heart. Cardiologist wanted checked out.      Last edited by Ronelle Coffee, MD on 07/15/2023  2:47 PM.      Referring physician: Omie Bickers, MD [address] [place],  [place] [address]  HISTORICAL INFORMATION:   Selected notes from the MEDICAL RECORD NUMBER Referred by Dr. Donalynn Fry LEE:  Ocular Hx- PMH-    CURRENT MEDICATIONS: No current outpatient medications on file. (Ophthalmic Drugs)   No current facility-administered medications for this visit. (Ophthalmic Drugs)   Current Outpatient Medications (Other)  Medication Sig   aspirin  EC 81 MG tablet Take 81 mg by mouth daily. Swallow whole.   atorvastatin (LIPITOR) 40 MG tablet Take 40 mg by mouth daily.   blood glucose meter kit and supplies KIT Dispense based on patient and insurance preference (something with affordable test strips). Use to check glucose twice daily as directed.   fenofibrate 160 MG tablet Take 160 mg by mouth daily.   ferrous sulfate 325 (65 FE) MG tablet Take by mouth.   fluticasone (FLONASE) 50 MCG/ACT nasal spray Place 2 sprays into both nostrils daily.   folic acid  (FOLVITE ) 1 MG tablet TAKE 1 TABLET BY MOUTH EVERY DAY   glucose blood (ACCU-CHEK GUIDE) test strip Use as instructed to monitor glucose twice daily, before breakfast and before bed   Insulin  Glargine  (BASAGLAR  KWIKPEN) 100 UNIT/ML Inject 30 Units into the skin at bedtime.   Insulin  Pen Needle 31G X 5 MM MISC 1 Units by Does not apply route daily.   JANUVIA 100 MG tablet Take 100 mg by mouth daily.   JARDIANCE 25 MG TABS tablet Take 25 mg by mouth daily.   levETIRAcetam  (KEPPRA ) 500 MG tablet Take 1 tablet (500 mg total) by mouth 2 (two) times daily.   Misc Natural Products (OSTEO BI-FLEX ADV DOUBLE ST PO) Take 1 tablet by mouth.   OneTouch Delica Lancets 33G MISC PLEASE SEE ATTACHED FOR DETAILED DIRECTIONS   pantoprazole  (PROTONIX ) 40 MG tablet TAKE 1 TABLET BY MOUTH 2 TIMES DAILY BEFORE A MEAL 30 MINUTES BEFORE BREAKFAST AND DINNER   ticagrelor  (BRILINTA ) 90 MG TABS tablet Take 1 tablet (90 mg total) by mouth 2 (two) times daily.   No current facility-administered medications for this visit. (Other)   REVIEW OF SYSTEMS: ROS   Positive for: Cardiovascular, Eyes, Respiratory Negative for: Constitutional, Gastrointestinal, Neurological, Skin, Genitourinary, Musculoskeletal, HENT, Endocrine, Psychiatric, Allergic/Imm, Heme/Lymph Last edited by Sylvan Evener, COA on 07/15/2023  2:06 PM.        ALLERGIES Allergies  Allergen Reactions   Reglan  [Metoclopramide ] Other (See Comments)    Tremors, parkinson like symptoms     PAST MEDICAL HISTORY Past Medical History:  Diagnosis Date  COVID    COVID 02/2020   very sick - admitted to the hospital   Diabetes mellitus without complication (HCC)    GERD (gastroesophageal reflux disease)    Headache    High cholesterol    History of kidney stones    Hypertension    was diagnosed 2 years ago, never treated and states BP is always - documented 07/13/21   Ichthyosis vulgaris    Pneumonia    Sleep apnea    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   Past Surgical History:  Procedure Laterality Date   IR ANGIO INTRA EXTRACRAN SEL COM CAROTID INNOMINATE BILAT MOD SED  04/17/2021   IR ANGIO INTRA EXTRACRAN SEL INTERNAL CAROTID UNI R  MOD SED  07/17/2021   IR ANGIO VERTEBRAL SEL VERTEBRAL BILAT MOD SED  04/17/2021   IR CT HEAD LTD  07/15/2021   IR INTRA CRAN STENT  07/15/2021   IR RADIOLOGIST EVAL & MGMT  03/05/2021   IR RADIOLOGIST EVAL & MGMT  05/04/2021   IR RADIOLOGIST EVAL & MGMT  07/31/2021   IR US  GUIDE VASC ACCESS RIGHT  04/17/2021   IR US  GUIDE VASC ACCESS RIGHT  07/15/2021   RADIOLOGY WITH ANESTHESIA N/A 07/15/2021   Procedure: STENTING;  Surgeon: Luellen Sages, MD;  Location: [facility] OR;  Service: Radiology;  Laterality: N/A;   TONSILLECTOMY     wisdom teeth removal     FAMILY HISTORY Family History  Problem Relation Age of Onset   Bursitis Mother    Bladder Cancer Mother 29       smoker   SOCIAL HISTORY Social History   Tobacco Use   Smoking status: Never   Smokeless tobacco: Never  Vaping Use   Vaping status: Never Used  Substance Use Topics   Alcohol use: Not Currently   Drug use: Never       OPHTHALMIC EXAM:  Base Eye Exam     Visual Acuity (Snellen - Linear)       Right Left   Dist cc 20/60 -2 20/150 -1   Dist ph cc 20/50 -1 NI    Correction: Glasses         Tonometry (Tonopen, 2:02 PM)       Right Left   Pressure 14 13         Pupils       Dark Light Shape React APD   Right 3 2 Round Brisk None   Left 3 2 Round Brisk None         Visual Fields (Counting fingers)       Left Right    Full Full         Extraocular Movement       Right Left    Full, Ortho Full, Ortho         Neuro/Psych     Oriented x3: Yes   Mood/Affect: Normal         Dilation     Both eyes: 1.0% Mydriacyl, 2.5% Phenylephrine  @ 2:02 PM           Slit Lamp and Fundus Exam     Slit Lamp Exam       Right Left   Lids/Lashes Dermatochalasis - upper lid, Meibomian gland dysfunction, +Scurf Dermatochalasis - upper lid, Meibomian gland dysfunction, +Scurf   Conjunctiva/Sclera White and quiet White and quiet   Cornea trace PEE, mild tear film debris 1+PEE, mild tear film debris    Anterior Chamber deep  and clear deep and clear   Iris Round and dilated, No NVI Round and dilated, No NVI   Lens Clear Clear   Anterior Vitreous Vitreous syneresis mild syneresis         Fundus Exam       Right Left   Disc Pink and Sharp, no heme Pink and sharp, fine NVD -- regressed, 360 exudates   C/D Ratio 0.2 0.2   Macula Blunted foveal reflex, severe edema with severe exudation, scattered DBH -- slightly improved Blunted foveal reflex, severe edema and exudation, scattered DBH -- improving   Vessels attenuated, Tortuous, +NVE greatest along inferior arcades -- improving, +Sheathing -- improving attenuated, Tortuous, +NV, +Sheathing of arterioles   Periphery Attached, scattered MA/DBH, heavy exudates greatest posteriorly Attached, severe exudation posteriorly, scattered MA / DBH           Refraction     Wearing Rx       Sphere Cylinder Axis Add   Right -2.50 +1.00 049 +1.00   Left -0.75 +1.25 178 +1.00            IMAGING AND PROCEDURES  Imaging and Procedures for 07/15/2023  OCT, Retina - OU - Both Eyes        Right Eye Quality was good. Central Foveal Thickness: 361. Progression has worsened. Findings include no SRF, abnormal foveal contour, subretinal hyper-reflective material, intraretinal hyper-reflective material, intraretinal fluid, vitreomacular adhesion (Persistent IRF, IRHM and SRHM greatest IN macula -- slightly increased).   Left Eye Quality was good. Central Foveal Thickness: 363. Progression has improved. Findings include no SRF, abnormal foveal contour, subretinal hyper-reflective material, intraretinal hyper-reflective material, epiretinal membrane, intraretinal fluid, lamellar hole, macular pucker (Persistent edema, IRHM and SRHM -- slightly improved inferior macula, SN macula unchanged, +ERM with pucker).   Notes  *Images captured and stored on drive  Diagnosis / Impression:  OD: Persistent IRF, IRHM and SRHM greatest IN macula -- slightly  increased OS: Persistent edema, IRHM and SRHM -- slightly improved inferior macula, SN macula unchanged, +ERM with pucker  Clinical management:  See below  Abbreviations: NFP - Normal foveal profile. CME - cystoid macular edema. PED - pigment epithelial detachment. IRF - intraretinal fluid. SRF - subretinal fluid. EZ - ellipsoid zone. ERM - epiretinal membrane. ORA - outer retinal atrophy. ORT - outer retinal tubulation. SRHM - subretinal hyper-reflective material. IRHM - intraretinal hyper-reflective material      Intravitreal Injection, Pharmacologic Agent - OS - Left Eye       Time Out 07/15/2023. 2:14 PM. Confirmed correct patient, procedure, site, and patient consented.   Anesthesia Topical anesthesia was used. Anesthetic medications included Lidocaine  2%, Proparacaine 0.5%.   Procedure Preparation included 5% betadine to ocular surface, eyelid speculum. A supplied (32g) needle was used.   Injection: 6 mg faricimab -svoa 6 MG/0.05ML   Route: Intravitreal, Site: Left Eye   NDC: 04540-981-19, Lot: [id number], Expiration date: 06/21/2024, Waste: 0 mL   Post-op Post injection exam found visual acuity of at least counting fingers. The patient tolerated the procedure well. There were no complications. The patient received written and verbal post procedure care education.      Intravitreal Injection, Pharmacologic Agent - OD - Right Eye       Time Out 07/15/2023. 2:14 PM. Confirmed correct patient, procedure, site, and patient consented.   Anesthesia Topical anesthesia was used. Anesthetic medications included Lidocaine  2%, Proparacaine 0.5%.   Procedure Preparation included 5% betadine to ocular surface, eyelid speculum. A supplied (32g) needle was used.  Injection: 6 mg faricimab -svoa 6 MG/0.05ML   Route: Intravitreal, Site: Right Eye   NDC: 16109-604-54, Lot: [id number], Expiration date: 06/21/2024, Waste: 0 mL   Post-op Post injection exam found visual acuity of at least  counting fingers. The patient tolerated the procedure well. There were no complications. The patient received written and verbal post procedure care education.            ASSESSMENT/PLAN:    ICD-10-CM   1. Proliferative diabetic retinopathy of both eyes with macular edema associated with type 2 diabetes mellitus (HCC)  E11.3513 OCT, Retina - OU - Both Eyes    Intravitreal Injection, Pharmacologic Agent - OS - Left Eye    Intravitreal Injection, Pharmacologic Agent - OD - Right Eye    faricimab -svoa (VABYSMO ) 6mg /0.55mL intravitreal injection    faricimab -svoa (VABYSMO ) 6mg /0.47mL intravitreal injection    2. Current use of insulin  (HCC)  Z79.4     3. Long term (current) use of oral hypoglycemic drugs  Z79.84     4. Hyperlipidemia associated with type 2 diabetes mellitus (HCC)  E11.69    E78.5     5. Essential hypertension  I10     6. Hypertensive retinopathy of both eyes  H35.033      1-3. Proliferative diabetic retinopathy, both eyes  - A1c 7.4 on 08.11.23  - s/p IVA OS #1 (02.22.24), #2 (03.22.24), #3 (04.19.24)  -s/p IVA OD #1 (02.23.24), #2 (03.22.24) #3 (04.19.24), #4 (05.17.24), #5 (06.18.24), #6 (11.08.24) -- IVA resistance OU ==================================================  - s/p IVE OD #1 (06.18.24), #2 (07.16.24), #3 (08.13.24), #4 (09.10.24), #5 (10.08.24), #6 (11.08.24), #7 (12.06.24), #8 (01.03.25), #9 (01.31.25), #10 (02.28.25), #11 (03.28.25) - s/p IVE OS #1 (05.17.24), #2 (06.18.24), #3 (7.16.24), #4 (08.13.24), #5 (09.10.24), #6 (10.08.24), #7 (11.08.24), #8 (12.06.24), #9 (01.03.25), #10 (01.31.25), #11 (02.28.25), #12 (03.28.25) -- IVE resistance OU - IVV OU #1 (04.25.25) ===================================================== - exam shows severe edema with scattered MA and DBH, but significant lipid component to edema OU - FA (02.21.24) shows OD: Large cluster of leaking NV along IT arcades, scattered patches of vascular non-perfusion; OS: +NVD and NVE along  proximal arcades - BCVA OD 20/50 from 20/70; OS stable at 20/150 - OCT shows OD: Persistent IRF, IRHM and SRHM greatest IN macula -- slightly increased; OS: Persistent edema, IRHM and SRHM -- slightly improved inferior macula, SN macula unchanged, +ERM with pucker at 4 wks - recommend IVV OU #2 today, 05.23.25 w/ f/u in 4 wks - pt wishes to proceed - RBA of procedure discussed, questions answered - IVE informed consent obtained and signed, 05.17.24 (OU) - IVV informed consent obtained and signed, 04.25.25 (OU) - see procedure note - Eylea  is approved for 2025, [email or web address] approved - Vabysmo  is approved for 2025, co-pay program approved - f/u 4 weeks -- DFE/OCT, possible injection OU -- possible FA  4. Hyperlipidemia  - review of labs shows severely elevated cholesterol and lipids  - 08.11.23:  Trig  785 Chol  598  LDL  410 - 01.17.25: Trig 335   Chol 279   LDL 173 - likely contributing to severe edema and exudates  5,6. Hypertensive retinopathy OU - discussed importance of tight BP control - continue to monitor   Ophthalmic Meds Ordered this visit:  Meds ordered this encounter  Medications   faricimab -svoa (VABYSMO ) 6mg /0.1mL intravitreal injection   faricimab -svoa (VABYSMO ) 6mg /0.4mL intravitreal injection     Return in about 4 weeks (around 08/12/2023) for f/u PDR OU, DFE, OCT, Possible Injxn.  There  are no Patient Instructions on file for this visit.  This document serves as a record of services personally performed by Jeanice Millard, MD, PhD. It was created on their behalf by Eller Gut COT, an ophthalmic technician. The creation of this record is the provider's dictation and/or activities during the visit.    Electronically signed by: Eller Gut COT 05.21.25 2:54 PM  This document serves as a record of services personally performed by Jeanice Millard, MD, PhD. It was created on their behalf by Morley Arabia. Bevin Bucks, OA an ophthalmic technician. The creation of  this record is the provider's dictation and/or activities during the visit.    Electronically signed by: Morley Arabia. Bevin Bucks, OA 07/15/23 2:54 PM  Jeanice Millard, M.D., Ph.D. Diseases & Surgery of the Retina and Vitreous [facility] 07/15/2023   I have reviewed the above documentation for accuracy and completeness, and I agree with the above. Jeanice Millard, M.D., Ph.D. 07/15/23 2:54 PM   Abbreviations: M myopia (nearsighted); A astigmatism; H hyperopia (farsighted); P presbyopia; Mrx spectacle prescription;  CTL contact lenses; OD right eye; OS left eye; OU both eyes  XT exotropia; ET esotropia; PEK punctate epithelial keratitis; PEE punctate epithelial erosions; DES dry eye syndrome; MGD meibomian gland dysfunction; ATs artificial tears; PFAT's preservative free artificial tears; NSC nuclear sclerotic cataract; PSC posterior subcapsular cataract; ERM epi-retinal membrane; PVD posterior vitreous detachment; RD retinal detachment; DM diabetes mellitus; DR diabetic retinopathy; NPDR non-proliferative diabetic retinopathy; PDR proliferative diabetic retinopathy; CSME clinically significant macular edema; DME diabetic macular edema; dbh dot blot hemorrhages; CWS cotton wool spot; POAG primary open angle glaucoma; C/D cup-to-disc ratio; HVF humphrey visual field; GVF goldmann visual field; OCT optical coherence tomography; IOP intraocular pressure; BRVO Branch retinal vein occlusion; CRVO central retinal vein occlusion; CRAO central retinal artery occlusion; BRAO branch retinal artery occlusion; RT retinal tear; SB scleral buckle; PPV pars plana vitrectomy; VH Vitreous hemorrhage; PRP panretinal laser photocoagulation; IVK intravitreal kenalog; VMT vitreomacular traction; MH Macular hole;  NVD neovascularization of the disc; NVE neovascularization elsewhere; AREDS age related eye disease study; ARMD age related macular degeneration; POAG primary open angle glaucoma; EBMD  epithelial/anterior basement membrane dystrophy; ACIOL anterior chamber intraocular lens; IOL intraocular lens; PCIOL posterior chamber intraocular lens; Phaco/IOL phacoemulsification with intraocular lens placement; PRK photorefractive keratectomy; LASIK laser assisted in situ keratomileusis; HTN hypertension; DM diabetes mellitus; COPD chronic obstructive pulmonary disease

## 2023-07-15 ENCOUNTER — Encounter (INDEPENDENT_AMBULATORY_CARE_PROVIDER_SITE_OTHER): Payer: Self-pay | Admitting: Ophthalmology

## 2023-07-15 ENCOUNTER — Ambulatory Visit (INDEPENDENT_AMBULATORY_CARE_PROVIDER_SITE_OTHER): Admitting: Ophthalmology

## 2023-07-15 DIAGNOSIS — E113513 Type 2 diabetes mellitus with proliferative diabetic retinopathy with macular edema, bilateral: Secondary | ICD-10-CM

## 2023-07-15 DIAGNOSIS — Z7984 Long term (current) use of oral hypoglycemic drugs: Secondary | ICD-10-CM

## 2023-07-15 DIAGNOSIS — H35033 Hypertensive retinopathy, bilateral: Secondary | ICD-10-CM

## 2023-07-15 DIAGNOSIS — I10 Essential (primary) hypertension: Secondary | ICD-10-CM

## 2023-07-15 DIAGNOSIS — Z794 Long term (current) use of insulin: Secondary | ICD-10-CM | POA: Diagnosis not present

## 2023-07-15 DIAGNOSIS — E1169 Type 2 diabetes mellitus with other specified complication: Secondary | ICD-10-CM

## 2023-07-15 MED ORDER — FARICIMAB-SVOA 6 MG/0.05ML IZ SOSY
6.0000 mg | PREFILLED_SYRINGE | INTRAVITREAL | Status: AC | PRN
  Administered 2023-07-15: 6 mg via INTRAVITREAL

## 2023-07-16 ENCOUNTER — Inpatient Hospital Stay: Admission: RE | Admit: 2023-07-16 | Source: Ambulatory Visit

## 2023-07-27 ENCOUNTER — Telehealth: Payer: Self-pay | Admitting: Student

## 2023-07-27 DIAGNOSIS — I6521 Occlusion and stenosis of right carotid artery: Secondary | ICD-10-CM

## 2023-07-27 MED ORDER — TICAGRELOR 90 MG PO TABS: 90.0000 mg | ORAL_TABLET | Freq: Two times a day (BID) | ORAL | 0 refills | Status: DC

## 2023-07-27 NOTE — Telephone Encounter (Signed)
 Brilinta  recently refilled, however due to cost patient requesting alternative rx.  Brilinta  90 day supply sent to [facility].  Prior Siegfried Dress may be required.  Symantha Steeber, MS RD PA-C

## 2023-07-28 ENCOUNTER — Other Ambulatory Visit (HOSPITAL_COMMUNITY): Payer: Self-pay | Admitting: Interventional Radiology

## 2023-07-28 DIAGNOSIS — I6529 Occlusion and stenosis of unspecified carotid artery: Secondary | ICD-10-CM

## 2023-07-29 ENCOUNTER — Ambulatory Visit: Payer: Self-pay | Admitting: Internal Medicine

## 2023-07-29 DIAGNOSIS — I429 Cardiomyopathy, unspecified: Secondary | ICD-10-CM

## 2023-08-03 DIAGNOSIS — E1165 Type 2 diabetes mellitus with hyperglycemia: Secondary | ICD-10-CM | POA: Diagnosis not present

## 2023-08-03 DIAGNOSIS — E1121 Type 2 diabetes mellitus with diabetic nephropathy: Secondary | ICD-10-CM | POA: Diagnosis not present

## 2023-08-03 DIAGNOSIS — Z79899 Other long term (current) drug therapy: Secondary | ICD-10-CM | POA: Diagnosis not present

## 2023-08-03 NOTE — Progress Notes (Signed)
 [facility] - Clinic Note  08/15/2023     CHIEF COMPLAINT Patient presents for Retina Follow Up   HISTORY OF PRESENT ILLNESS: Jose Koch is a 53 y.o. male who presents to the clinic today for:   HPI     Retina Follow Up   Patient presents with  Diabetic Retinopathy.  In both eyes.  This started 4 weeks ago.  I, the attending physician,  performed the HPI with the patient and updated documentation appropriately.        Comments   Patient states the cholesterol build up in his left eye seems to be better and blocking his central vision less. Pt denis FOL/floaters/pain. Pt is not using ats.   A1c = 9.2 due to prednisone  in May, 6.4 before that. BS = 202 last night.      Last edited by Calah Gershman, MD on 08/15/2023  9:08 PM.    Pt states he can read the text on his phone a little bit better, he is about to have a CT scan on his heart bc one side of his heart is pumping harder than the other   Referring physician: Shona Norleen PEDLAR, MD [address] [name],  [place] [address]  HISTORICAL INFORMATION:   Selected notes from the MEDICAL RECORD NUMBER Referred by Dr. Nanetta Sharps LEE:  Ocular Hx- PMH-    CURRENT MEDICATIONS: No current outpatient medications on file. (Ophthalmic Drugs)   No current facility-administered medications for this visit. (Ophthalmic Drugs)   Current Outpatient Medications (Other)  Medication Sig   aspirin  EC 81 MG tablet Take 81 mg by mouth daily. Swallow whole.   atorvastatin (LIPITOR) 40 MG tablet Take 40 mg by mouth daily.   blood glucose meter kit and supplies KIT Dispense based on patient and insurance preference (something with affordable test strips). Use to check glucose twice daily as directed.   carbidopa -levodopa  (SINEMET  IR) 25-100 MG tablet PLEASE SEE ATTACHED FOR DETAILED DIRECTIONS   fenofibrate 160 MG tablet Take 160 mg by mouth daily.   ferrous sulfate 325 (65 FE) MG tablet Take by mouth.   fluticasone  (FLONASE) 50 MCG/ACT nasal spray Place 2 sprays into both nostrils daily.   folic acid  (FOLVITE ) 1 MG tablet TAKE 1 TABLET BY MOUTH EVERY DAY   glucose blood (ACCU-CHEK GUIDE) test strip Use as instructed to monitor glucose twice daily, before breakfast and before bed   Insulin  Glargine (BASAGLAR  KWIKPEN) 100 UNIT/ML Inject 30 Units into the skin at bedtime.   Insulin  Pen Needle 31G X 5 MM MISC 1 Units by Does not apply route daily.   JANUVIA 100 MG tablet Take 100 mg by mouth daily.   JARDIANCE 25 MG TABS tablet Take 25 mg by mouth daily.   levETIRAcetam  (KEPPRA ) 500 MG tablet Take 1 tablet (500 mg total) by mouth 2 (two) times daily.   metoprolol  tartrate (LOPRESSOR ) 100 MG tablet Take 1 tablet (100 mg total) by mouth as directed. Take 2 Hours Prior to Cardiac CT Scan   Misc Natural Products (OSTEO BI-FLEX ADV DOUBLE ST PO) Take 1 tablet by mouth.   OneTouch Delica Lancets 33G MISC PLEASE SEE ATTACHED FOR DETAILED DIRECTIONS   pantoprazole  (PROTONIX ) 40 MG tablet TAKE 1 TABLET BY MOUTH 2 TIMES DAILY BEFORE A MEAL 30 MINUTES BEFORE BREAKFAST AND DINNER   REPATHA SURECLICK 140 MG/ML SOAJ INJECT 1 ML SUBCUTANEOUSLY EVERY 2 WEEKS   ticagrelor  (BRILINTA ) 90 MG TABS tablet Take 1 tablet (  90 mg total) by mouth 2 (two) times daily.   No current facility-administered medications for this visit. (Other)   REVIEW OF SYSTEMS: ROS   Positive for: Cardiovascular, Eyes, Respiratory Negative for: Constitutional, Gastrointestinal, Neurological, Skin, Genitourinary, Musculoskeletal, HENT, Endocrine, Psychiatric, Allergic/Imm, Heme/Lymph Last edited by [name], COT on 08/15/2023  1:17 PM.     ALLERGIES Allergies  Allergen Reactions   Reglan  [Metoclopramide ] Other (See Comments)    Tremors, parkinson like symptoms    PAST MEDICAL HISTORY Past Medical History:  Diagnosis Date   COVID    COVID 02/2020   very sick - admitted to the hospital   Diabetes mellitus without complication (HCC)     GERD (gastroesophageal reflux disease)    Headache    High cholesterol    History of kidney stones    Hypertension    was diagnosed 2 years ago, never treated and states BP is always - documented 07/13/21   Ichthyosis vulgaris    Pneumonia    Sleep apnea    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   Past Surgical History:  Procedure Laterality Date   IR ANGIO INTRA EXTRACRAN SEL COM CAROTID INNOMINATE BILAT MOD SED  04/17/2021   IR ANGIO INTRA EXTRACRAN SEL INTERNAL CAROTID UNI R MOD SED  07/17/2021   IR ANGIO VERTEBRAL SEL VERTEBRAL BILAT MOD SED  04/17/2021   IR CT HEAD LTD  07/15/2021   IR INTRA CRAN STENT  07/15/2021   IR RADIOLOGIST EVAL & MGMT  03/05/2021   IR RADIOLOGIST EVAL & MGMT  05/04/2021   IR RADIOLOGIST EVAL & MGMT  07/31/2021   IR US  GUIDE VASC ACCESS RIGHT  04/17/2021   IR US  GUIDE VASC ACCESS RIGHT  07/15/2021   RADIOLOGY WITH ANESTHESIA N/A 07/15/2021   Procedure: STENTING;  Surgeon: Dolphus Carrion, MD;  Location: [facility] OR;  Service: Radiology;  Laterality: N/A;   TONSILLECTOMY     wisdom teeth removal     FAMILY HISTORY Family History  Problem Relation Age of Onset   Bursitis Mother    Bladder Cancer Mother 87       smoker   SOCIAL HISTORY Social History   Tobacco Use   Smoking status: Never   Smokeless tobacco: Never  Vaping Use   Vaping status: Never Used  Substance Use Topics   Alcohol use: Not Currently   Drug use: Never       OPHTHALMIC EXAM:  Base Eye Exam     Visual Acuity (Snellen - Linear)       Right Left   Dist cc 20/60 -1 20/200 +2   Dist ph cc NI 20/150 -2    Correction: Glasses         Tonometry (Tonopen, 1:24 PM)       Right Left   Pressure 11 14         Pupils       Pupils Dark Light Shape React APD   Right PERRL 3 2 Round Brisk None   Left PERRL 3 2 Round Brisk None         Visual Fields       Left Right    Full Full         Extraocular Movement       Right Left    Full, Ortho Full, Ortho          Neuro/Psych     Oriented x3: Yes   Mood/Affect: Normal  Dilation     Both eyes: 1.0% Mydriacyl, 2.5% Phenylephrine  @ 1:25 PM           Slit Lamp and Fundus Exam     Slit Lamp Exam       Right Left   Lids/Lashes Dermatochalasis - upper lid, Meibomian gland dysfunction, +Scurf Dermatochalasis - upper lid, Meibomian gland dysfunction, +Scurf   Conjunctiva/Sclera White and quiet White and quiet   Cornea trace PEE, mild tear film debris 1+PEE, mild tear film debris   Anterior Chamber deep and clear deep and clear   Iris Round and dilated, No NVI Round and dilated, No NVI   Lens Clear Clear   Anterior Vitreous Vitreous syneresis mild syneresis         Fundus Exam       Right Left   Disc Pink and Sharp, no heme Pink and sharp, fine NVD -- regressed, 360 exudates   C/D Ratio 0.2 0.2   Macula Blunted foveal reflex, severe edema with severe exudation -- improving, scattered DBH -- slightly improved Blunted foveal reflex, severe edema and exudation, scattered DBH -- improving   Vessels attenuated, Tortuous, +NVE greatest along inferior arcades -- improving, +Sheathing -- improving attenuated, Tortuous, +NV, +Sheathing of arterioles   Periphery Attached, scattered MA/DBH, heavy exudates greatest posteriorly Attached, severe exudation posteriorly, scattered MA / DBH           Refraction     Wearing Rx       Sphere Cylinder Axis Add   Right -2.50 +1.00 049 +1.00   Left -0.75 +1.25 178 +1.00            IMAGING AND PROCEDURES  Imaging and Procedures for 08/15/2023  OCT, Retina - OU - Both Eyes       Right Eye Quality was good. Central Foveal Thickness: 369. Progression has improved. Findings include no SRF, abnormal foveal contour, subretinal hyper-reflective material, intraretinal hyper-reflective material, intraretinal fluid, vitreomacular adhesion (Interval improvement in IRF, IRHM and SRHM greatest IN macula ).   Left Eye Quality was good. Central  Foveal Thickness: 357. Progression has improved. Findings include no SRF, abnormal foveal contour, subretinal hyper-reflective material, intraretinal hyper-reflective material, epiretinal membrane, intraretinal fluid, lamellar hole, macular pucker (Persistent edema, IRHM and SRHM -- slightly improved SN macula, inferior macula unchanged, +ERM with pucker).   Notes *Images captured and stored on drive  Diagnosis / Impression:  OD: Interval improvement in IRF, IRHM and SRHM greatest IN macula  OS: Persistent edema, IRHM and SRHM -- slightly improved SN macula, inferior macula unchanged, +ERM with pucker  Clinical management:  See below  Abbreviations: NFP - Normal foveal profile. CME - cystoid macular edema. PED - pigment epithelial detachment. IRF - intraretinal fluid. SRF - subretinal fluid. EZ - ellipsoid zone. ERM - epiretinal membrane. ORA - outer retinal atrophy. ORT - outer retinal tubulation. SRHM - subretinal hyper-reflective material. IRHM - intraretinal hyper-reflective material      Intravitreal Injection, Pharmacologic Agent - OD - Right Eye       Time Out 08/15/2023. 2:00 PM. Confirmed correct patient, procedure, site, and patient consented.   Anesthesia Topical anesthesia was used. Anesthetic medications included Lidocaine  2%, Proparacaine 0.5%.   Procedure Preparation included 5% betadine to ocular surface, eyelid speculum. A supplied (32g) needle was used.   Injection: 6 mg faricimab -svoa 6 MG/0.05ML   Route: Intravitreal, Site: Right Eye   NDC: 49757-903-93, Lot: [id number], Expiration date: 06/21/2024, Waste: 0 mL   Post-op Post injection exam found visual  acuity of at least counting fingers. The patient tolerated the procedure well. There were no complications. The patient received written and verbal post procedure care education.      Intravitreal Injection, Pharmacologic Agent - OS - Left Eye       Time Out 08/15/2023. 2:00 PM. Confirmed correct patient,  procedure, site, and patient consented.   Anesthesia Topical anesthesia was used. Anesthetic medications included Lidocaine  2%, Proparacaine 0.5%.   Procedure Preparation included 5% betadine to ocular surface, eyelid speculum. A supplied (32g) needle was used.   Injection: 6 mg faricimab -svoa 6 MG/0.05ML   Route: Intravitreal, Site: Left Eye   NDC: 49757-903-93, Lot: [id number], Expiration date: 07/22/2024, Waste: 0 mL   Post-op Post injection exam found visual acuity of at least counting fingers. The patient tolerated the procedure well. There were no complications. The patient received written and verbal post procedure care education.            ASSESSMENT/PLAN:    ICD-10-CM   1. Proliferative diabetic retinopathy of both eyes with macular edema associated with type 2 diabetes mellitus (HCC)  E11.3513 OCT, Retina - OU - Both Eyes    Intravitreal Injection, Pharmacologic Agent - OD - Right Eye    Intravitreal Injection, Pharmacologic Agent - OS - Left Eye    faricimab -svoa (VABYSMO ) 6mg /0.5mL intravitreal injection    faricimab -svoa (VABYSMO ) 6mg /0.109mL intravitreal injection    2. Current use of insulin  (HCC)  Z79.4     3. Long term (current) use of oral hypoglycemic drugs  Z79.84     4. Hyperlipidemia associated with type 2 diabetes mellitus (HCC)  E11.69    E78.5     5. Essential hypertension  I10     6. Hypertensive retinopathy of both eyes  H35.033      1-3. Proliferative diabetic retinopathy, both eyes  - A1c 7.4 on 08.11.23  - s/p IVA OS #1 (02.22.24), #2 (03.22.24), #3 (04.19.24)  -s/p IVA OD #1 (02.23.24), #2 (03.22.24) #3 (04.19.24), #4 (05.17.24), #5 (06.18.24), #6 (11.08.24) -- IVA resistance OU ==================================================  - s/p IVE OD #1 (06.18.24), #2 (07.16.24), #3 (08.13.24), #4 (09.10.24), #5 (10.08.24), #6 (11.08.24), #7 (12.06.24), #8 (01.03.25), #9 (01.31.25), #10 (02.28.25), #11 (03.28.25) - s/p IVE OS #1 (05.17.24), #2  (06.18.24), #3 (7.16.24), #4 (08.13.24), #5 (09.10.24), #6 (10.08.24), #7 (11.08.24), #8 (12.06.24), #9 (01.03.25), #10 (01.31.25), #11 (02.28.25), #12 (03.28.25) -- IVE resistance OU - IVV OU #1 (04.25.25), #2 (05.23.25) ===================================================== - exam shows severe edema with scattered MA and DBH, but significant lipid component to edema OU - FA (02.21.24) shows OD: Large cluster of leaking NV along IT arcades, scattered patches of vascular non-perfusion; OS: +NVD and NVE along proximal arcades - BCVA OD 20/60 from 20/50; OS stable at 20/150 - OCT shows OD: Interval improvement in IRF, IRHM and SRHM greatest IN macula; OS: Persistent edema, IRHM and SRHM -- slightly improved SN macula, inferior macula unchanged, +ERM with pucker at 4 wks - recommend IVV OU #3 today, 06.23.25 w/ f/u in 4 wks - pt wishes to proceed - RBA of procedure discussed, questions answered - IVV informed consent obtained and signed, 04.25.25 (OU) - see procedure note - Eylea  is approved for 2025, Eylea4u approved - Vabysmo  is approved for 2025, co-pay program approved - f/u 4 weeks -- DFE/OCT, possible injection OU -- possible FA  4. Hyperlipidemia  - review of labs shows severely elevated cholesterol and lipids  - 08.11.23:  Trig  785 Chol  598  LDL  410 -  01.17.25: Trig 335   Chol 279   LDL 173 - likely contributing to severe edema and exudates  5,6. Hypertensive retinopathy OU - discussed importance of tight BP control - monitor   Ophthalmic Meds Ordered this visit:  Meds ordered this encounter  Medications   faricimab -svoa (VABYSMO ) 6mg /0.34mL intravitreal injection   faricimab -svoa (VABYSMO ) 6mg /0.89mL intravitreal injection     Return in about 4 weeks (around 09/12/2023) for f/u PDR OU, DFE, OCT, Possible Injxn.  There are no Patient Instructions on file for this visit.  This document serves as a record of services personally performed by [place] JUDITHANN Hans, MD, PhD. It  was created on their behalf by Avelina Pereyra, COA an ophthalmic technician. The creation of this record is the provider's dictation and/or activities during the visit.   Electronically signed by: Avelina GORMAN Pereyra, COT  08/15/23  9:13 PM   This document serves as a record of services personally performed by Redell JUDITHANN Hans, MD, PhD. It was created on their behalf by Alan PARAS. Delores, OA an ophthalmic technician. The creation of this record is the provider's dictation and/or activities during the visit.    Electronically signed by: Alan PARAS. Delores, OA 08/15/23 9:13 PM  [place] JUDITHANN Hans, M.D., Ph.D. Diseases & Surgery of the Retina and Vitreous [facility]  I have reviewed the above documentation for accuracy and completeness, and I agree with the above. [place] JUDITHANN Hans, M.D., Ph.D. 08/15/23 9:16 PM   Abbreviations: M myopia (nearsighted); A astigmatism; H hyperopia (farsighted); P presbyopia; Mrx spectacle prescription;  CTL contact lenses; OD right eye; OS left eye; OU both eyes  XT exotropia; ET esotropia; PEK punctate epithelial keratitis; PEE punctate epithelial erosions; DES dry eye syndrome; MGD meibomian gland dysfunction; ATs artificial tears; PFAT's preservative free artificial tears; NSC nuclear sclerotic cataract; PSC posterior subcapsular cataract; ERM epi-retinal membrane; PVD posterior vitreous detachment; RD retinal detachment; DM diabetes mellitus; DR diabetic retinopathy; NPDR non-proliferative diabetic retinopathy; PDR proliferative diabetic retinopathy; CSME clinically significant macular edema; DME diabetic macular edema; dbh dot blot hemorrhages; CWS cotton wool spot; POAG primary open angle glaucoma; C/D cup-to-disc ratio; HVF humphrey visual field; GVF goldmann visual field; OCT optical coherence tomography; IOP intraocular pressure; BRVO Branch retinal vein occlusion; CRVO central retinal vein occlusion; CRAO central retinal artery occlusion; BRAO branch  retinal artery occlusion; RT retinal tear; SB scleral buckle; PPV pars plana vitrectomy; VH Vitreous hemorrhage; PRP panretinal laser photocoagulation; IVK intravitreal kenalog; VMT vitreomacular traction; MH Macular hole;  NVD neovascularization of the disc; NVE neovascularization elsewhere; AREDS age related eye disease study; ARMD age related macular degeneration; POAG primary open angle glaucoma; EBMD epithelial/anterior basement membrane dystrophy; ACIOL anterior chamber intraocular lens; IOL intraocular lens; PCIOL posterior chamber intraocular lens; Phaco/IOL phacoemulsification with intraocular lens placement; PRK photorefractive keratectomy; LASIK laser assisted in situ keratomileusis; HTN hypertension; DM diabetes mellitus; COPD chronic obstructive pulmonary disease

## 2023-08-04 ENCOUNTER — Other Ambulatory Visit: Payer: Self-pay

## 2023-08-04 DIAGNOSIS — E611 Iron deficiency: Secondary | ICD-10-CM

## 2023-08-04 DIAGNOSIS — E538 Deficiency of other specified B group vitamins: Secondary | ICD-10-CM

## 2023-08-04 DIAGNOSIS — E13319 Other specified diabetes mellitus with unspecified diabetic retinopathy without macular edema: Secondary | ICD-10-CM

## 2023-08-04 DIAGNOSIS — D696 Thrombocytopenia, unspecified: Secondary | ICD-10-CM

## 2023-08-04 DIAGNOSIS — R161 Splenomegaly, not elsewhere classified: Secondary | ICD-10-CM

## 2023-08-05 ENCOUNTER — Encounter: Payer: Self-pay | Admitting: *Deleted

## 2023-08-05 ENCOUNTER — Inpatient Hospital Stay: Payer: BC Managed Care – PPO | Attending: Oncology

## 2023-08-05 DIAGNOSIS — E611 Iron deficiency: Secondary | ICD-10-CM

## 2023-08-05 DIAGNOSIS — D696 Thrombocytopenia, unspecified: Secondary | ICD-10-CM | POA: Insufficient documentation

## 2023-08-05 DIAGNOSIS — E13319 Other specified diabetes mellitus with unspecified diabetic retinopathy without macular edema: Secondary | ICD-10-CM

## 2023-08-05 DIAGNOSIS — Z7982 Long term (current) use of aspirin: Secondary | ICD-10-CM | POA: Insufficient documentation

## 2023-08-05 DIAGNOSIS — E11319 Type 2 diabetes mellitus with unspecified diabetic retinopathy without macular edema: Secondary | ICD-10-CM | POA: Diagnosis not present

## 2023-08-05 DIAGNOSIS — E538 Deficiency of other specified B group vitamins: Secondary | ICD-10-CM | POA: Diagnosis not present

## 2023-08-05 DIAGNOSIS — D509 Iron deficiency anemia, unspecified: Secondary | ICD-10-CM | POA: Insufficient documentation

## 2023-08-05 DIAGNOSIS — R161 Splenomegaly, not elsewhere classified: Secondary | ICD-10-CM

## 2023-08-05 LAB — CBC WITH DIFFERENTIAL/PLATELET
Abs Immature Granulocytes: 0.04 10*3/uL (ref 0.00–0.07)
Basophils Absolute: 0 10*3/uL (ref 0.0–0.1)
Basophils Relative: 1 %
Eosinophils Absolute: 0.1 10*3/uL (ref 0.0–0.5)
Eosinophils Relative: 2 %
HCT: 33.7 % — ABNORMAL LOW (ref 39.0–52.0)
Hemoglobin: 11.1 g/dL — ABNORMAL LOW (ref 13.0–17.0)
Immature Granulocytes: 1 %
Lymphocytes Relative: 28 %
Lymphs Abs: 1.7 10*3/uL (ref 0.7–4.0)
MCH: 28.1 pg (ref 26.0–34.0)
MCHC: 32.9 g/dL (ref 30.0–36.0)
MCV: 85.3 fL (ref 80.0–100.0)
Monocytes Absolute: 0.4 10*3/uL (ref 0.1–1.0)
Monocytes Relative: 7 %
Neutro Abs: 3.8 10*3/uL (ref 1.7–7.7)
Neutrophils Relative %: 61 %
Platelets: 187 10*3/uL (ref 150–400)
RBC: 3.95 MIL/uL — ABNORMAL LOW (ref 4.22–5.81)
RDW: 13.5 % (ref 11.5–15.5)
WBC: 6.1 10*3/uL (ref 4.0–10.5)
nRBC: 0 % (ref 0.0–0.2)

## 2023-08-05 LAB — IRON AND TIBC
Iron: 60 ug/dL (ref 45–182)
Saturation Ratios: 13 % — ABNORMAL LOW (ref 17.9–39.5)
TIBC: 450 ug/dL (ref 250–450)
UIBC: 390 ug/dL

## 2023-08-05 LAB — COMPREHENSIVE METABOLIC PANEL WITH GFR
ALT: 26 U/L (ref 0–44)
AST: 18 U/L (ref 15–41)
Albumin: 3.9 g/dL (ref 3.5–5.0)
Alkaline Phosphatase: 54 U/L (ref 38–126)
Anion gap: 9 (ref 5–15)
BUN: 29 mg/dL — ABNORMAL HIGH (ref 6–20)
CO2: 23 mmol/L (ref 22–32)
Calcium: 9.1 mg/dL (ref 8.9–10.3)
Chloride: 102 mmol/L (ref 98–111)
Creatinine, Ser: 1.98 mg/dL — ABNORMAL HIGH (ref 0.61–1.24)
GFR, Estimated: 40 mL/min — ABNORMAL LOW (ref >60)
Glucose, Bld: 247 mg/dL — ABNORMAL HIGH (ref 70–99)
Potassium: 4.6 mmol/L (ref 3.5–5.1)
Sodium: 134 mmol/L — ABNORMAL LOW (ref 135–145)
Total Bilirubin: 0.5 mg/dL (ref 0.0–1.2)
Total Protein: 7.5 g/dL (ref 6.5–8.1)

## 2023-08-05 LAB — VITAMIN B12: Vitamin B-12: 491 pg/mL (ref 180–914)

## 2023-08-05 LAB — FERRITIN: Ferritin: 331 ng/mL (ref 24–336)

## 2023-08-05 LAB — FOLATE: Folate: 18.1 ng/mL (ref >5.9)

## 2023-08-05 LAB — LACTATE DEHYDROGENASE: LDH: 115 U/L (ref 98–192)

## 2023-08-05 MED ORDER — METOPROLOL TARTRATE 100 MG PO TABS: 100.0000 mg | ORAL_TABLET | ORAL | 0 refills | Status: DC

## 2023-08-05 NOTE — Telephone Encounter (Signed)
-----   Message from Vishnu P Mallipeddi sent at 07/29/2023  2:45 PM EDT ----- LV function is 45% with hypokinesis of inferoseptal and inferior walls, normal RV function, no valvular heart disease and CVP 3 mmHg.  Patient had no symptoms during recent office visit, keep appointment in August 2025.  Obtain CT cardiac for ischemia evaluation due to LV function being mildly reduced, 45%.

## 2023-08-05 NOTE — Telephone Encounter (Signed)
 Pt notified of echo results and the need to have Cardiac CT done. Letter sent in MyChart and mailed to pt.

## 2023-08-12 ENCOUNTER — Inpatient Hospital Stay (HOSPITAL_BASED_OUTPATIENT_CLINIC_OR_DEPARTMENT_OTHER): Payer: BC Managed Care – PPO | Admitting: Oncology

## 2023-08-12 DIAGNOSIS — D696 Thrombocytopenia, unspecified: Secondary | ICD-10-CM

## 2023-08-12 DIAGNOSIS — E13319 Other specified diabetes mellitus with unspecified diabetic retinopathy without macular edema: Secondary | ICD-10-CM | POA: Diagnosis not present

## 2023-08-12 DIAGNOSIS — E538 Deficiency of other specified B group vitamins: Secondary | ICD-10-CM

## 2023-08-12 DIAGNOSIS — R161 Splenomegaly, not elsewhere classified: Secondary | ICD-10-CM

## 2023-08-12 DIAGNOSIS — E611 Iron deficiency: Secondary | ICD-10-CM

## 2023-08-12 DIAGNOSIS — D509 Iron deficiency anemia, unspecified: Secondary | ICD-10-CM

## 2023-08-12 NOTE — Progress Notes (Signed)
 Virtual Visit via Telephone Note  I connected with Jose Koch on 08/12/23 at 11:30 AM EDT by telephone and verified that I am speaking with the correct person using two identifiers.  Location: Patient: Home  Provider: Clinic    I discussed the limitations, risks, security and privacy concerns of performing an evaluation and management service by telephone and the availability of in person appointments. I also discussed with the patient that there may be a patient responsible charge related to this service. The patient expressed understanding and agreed to proceed.   History of Present Illness: Patient is a 53 year old male with past medical history significant for thrombocytopenia and splenomegaly.  He was last seen virtually on 04/18/23.  He continues to be followed closely by ophthalmology for newly diagnosed diabetic retinopathy in both eyes.  He continues to receive bilateral eye injections (Eylea ).  His eyesight has improved.  Reports overall he is doing well.  No pain.  Has an occasional cough.  Reports constipation secondary to iron tablets.  Denies any bleeding. Reports he has not had a fall since Christmas.  Things appear to be improving.  He has occasional dizziness and headaches.   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  Additional foci of susceptibility artifact in the right cerebral hemisphere may represent chronic microhemorrhages.  Several of these appear new since exam in 2022.  Additionally he had a nuclear medicine brain imaging for hand tremors and the potential for medication induced Parkinson's disease.  Imaging revealed no reduced radiotracer activity suggesting Parkinson syndrome pathology.  He is scheduled for a CT angio head neck on 08/16/2023 for follow-up for stenosis of carotid artery.   Observations/Objective: Review of Systems  Respiratory:  Positive for cough.    Gastrointestinal:  Positive for constipation.  Musculoskeletal:  Positive for falls.  Neurological:  Positive for dizziness.   Physical Exam Vitals reviewed.   Neurological:     Mental Status: He is alert and oriented to person, place, and time.      Assessment and Plan: 1. Splenomegaly (Primary) -CT AP on 12/03/2020 showed stable splenomegaly with no significant change in multiple splenic hypodensities.  No adenopathy. -Denies any B symptoms. -He is currently on aspirin  and Brilinta  for right ICA stent placement. -Labs from 08/05/2023 show hemoglobin of 11.1 (10.0), platelets 187 (161) with normal differential.  Ferritin 331.  2. Thrombocytopenia (HCC) -Mild to moderate thrombocytopenia since 2017 likely from splenomegaly. -Most recent labs show a platelet count of 187. -Recheck labs in 6 months.  3. Folate deficiency -Previously noted to have a low folate level at 4.2. -He was started on 1 mg folic acid  supplements daily. -Repeat folate levels from 08/05/23 are normal at 18.1.  -Continue every other day folic acid .  4. Diabetic retinopathy of both eyes associated with diabetes mellitus of other type, macular edema presence unspecified, unspecified retinopathy severity (HCC) -He is followed by Dr. Karyl Paget ophthalmology and has been receiving Eylea  injections into both his eyes.  This appears to be helping his vision.  6. IDA -Patient has been anemic since February 2022. -He has been anywhere between 9.5 and 12.5. -he denies any bleeding.  He is taking oral iron supplements daily with mild constipation. -We discussed increasing oral iron from once per day to twice daily versus IV iron.  -Iron levels from 08/05/2023 show iron saturations of 13%, ferritin 331 and TIBC of 450. -There is  significant improvement of his hemoglobin from 10-11.1.  I would continue oral iron for now.  Follow Up Instructions: -Continue every other day folic acid . -Continue iron twice per day along with  vitamin C to increase absorption.  Please take at least 6 hours apart from your Protonix  to increase absorption.  If unable to tolerate, reduce back down to 1 daily. -Return to clinic in 6 months with labs a few days before and office visit.   I discussed the assessment and treatment plan with the patient. The patient was provided an opportunity to ask questions and all were answered. The patient agreed with the plan and demonstrated an understanding of the instructions.   The patient was advised to call back or seek an in-person evaluation if the symptoms worsen or if the condition fails to improve as anticipated.  I spent 25 minutes dedicated to the care of this patient (face-to-face and non-face-to-face) on the date of the encounter to include what is described in the assessment and plan.  Aurther Blue, NP

## 2023-08-15 ENCOUNTER — Encounter (INDEPENDENT_AMBULATORY_CARE_PROVIDER_SITE_OTHER): Payer: Self-pay | Admitting: Ophthalmology

## 2023-08-15 ENCOUNTER — Ambulatory Visit (INDEPENDENT_AMBULATORY_CARE_PROVIDER_SITE_OTHER): Payer: Self-pay | Admitting: Ophthalmology

## 2023-08-15 DIAGNOSIS — I10 Essential (primary) hypertension: Secondary | ICD-10-CM

## 2023-08-15 DIAGNOSIS — H35033 Hypertensive retinopathy, bilateral: Secondary | ICD-10-CM

## 2023-08-15 DIAGNOSIS — E113513 Type 2 diabetes mellitus with proliferative diabetic retinopathy with macular edema, bilateral: Secondary | ICD-10-CM | POA: Diagnosis not present

## 2023-08-15 DIAGNOSIS — E1169 Type 2 diabetes mellitus with other specified complication: Secondary | ICD-10-CM

## 2023-08-15 DIAGNOSIS — Z794 Long term (current) use of insulin: Secondary | ICD-10-CM

## 2023-08-15 DIAGNOSIS — Z7984 Long term (current) use of oral hypoglycemic drugs: Secondary | ICD-10-CM | POA: Diagnosis not present

## 2023-08-15 MED ORDER — FARICIMAB-SVOA 6 MG/0.05ML IZ SOSY
6.0000 mg | PREFILLED_SYRINGE | INTRAVITREAL | Status: AC | PRN
  Administered 2023-08-15: 6 mg via INTRAVITREAL

## 2023-08-15 MED ORDER — FARICIMAB-SVOA 6 MG/0.05ML IZ SOSY
6.0000 mg | PREFILLED_SYRINGE | INTRAVITREAL | Status: AC | PRN
Start: 2023-08-15 — End: 2023-08-15
  Administered 2023-08-15: 6 mg via INTRAVITREAL

## 2023-08-16 ENCOUNTER — Ambulatory Visit
Admission: RE | Admit: 2023-08-16 | Discharge: 2023-08-16 | Discharge status: Home / Self Care | Source: Ambulatory Visit | Attending: Interventional Radiology | Admitting: Interventional Radiology

## 2023-08-16 DIAGNOSIS — I6523 Occlusion and stenosis of bilateral carotid arteries: Secondary | ICD-10-CM | POA: Diagnosis not present

## 2023-08-16 DIAGNOSIS — I6529 Occlusion and stenosis of unspecified carotid artery: Secondary | ICD-10-CM

## 2023-08-16 MED ORDER — IOPAMIDOL (ISOVUE-370) INJECTION 76%
75.0000 mL | Freq: Once | INTRAVENOUS | Status: AC | PRN
  Administered 2023-08-16: 75 mL via INTRAVENOUS

## 2023-08-19 ENCOUNTER — Encounter (HOSPITAL_COMMUNITY): Payer: Self-pay | Admitting: Interventional Radiology

## 2023-08-24 ENCOUNTER — Telehealth (HOSPITAL_COMMUNITY): Payer: Self-pay

## 2023-08-24 NOTE — Telephone Encounter (Signed)
Called regarding recent imaging, no answer, left vm. AB

## 2023-08-29 ENCOUNTER — Telehealth (HOSPITAL_COMMUNITY): Payer: Self-pay

## 2023-08-29 NOTE — Telephone Encounter (Signed)
 Pt is aware that Dr. Dolphus is leaving and will f/u with PCP regarding 1 year U/S imaging. AB

## 2023-09-01 NOTE — Progress Notes (Addendum)
 [facility] - Clinic Note  09/12/2023     CHIEF COMPLAINT Patient presents for Retina Follow Up   HISTORY OF PRESENT ILLNESS: Jose Koch is a 53 y.o. male who presents to the clinic today for:   HPI     Retina Follow Up   Patient presents with  Diabetic Retinopathy.  In both eyes.  This started 4 weeks ago.  Duration of 4 weeks.  Since onset it is stable.  I, the attending physician,  performed the HPI with the patient and updated documentation appropriately.        Comments   4 week retina follow up PDR and IVV OU pt is reporting no vision changes noticed he denies any flashes some floaters his last reading 182 last night       Last edited by Jose Rogue, MD on 09/12/2023  7:08 PM.     Referring physician: Shona Norleen PEDLAR, MD [address] [place],  [place] [address]  HISTORICAL INFORMATION:   Selected notes from the MEDICAL RECORD NUMBER Referred by Dr. Nanetta Sharps Koch:  Ocular Hx- PMH-    CURRENT MEDICATIONS: No current outpatient medications on file. (Ophthalmic Drugs)   No current facility-administered medications for this visit. (Ophthalmic Drugs)   Current Outpatient Medications (Other)  Medication Sig   aspirin  EC 81 MG tablet Take 81 mg by mouth daily. Swallow whole.   atorvastatin (LIPITOR) 40 MG tablet Take 40 mg by mouth daily.   blood glucose meter kit and supplies KIT Dispense based on patient and insurance preference (something with affordable test strips). Use to check glucose twice daily as directed.   carbidopa -levodopa  (SINEMET  IR) 25-100 MG tablet PLEASE SEE ATTACHED FOR DETAILED DIRECTIONS   fenofibrate 160 MG tablet Take 160 mg by mouth daily.   ferrous sulfate 325 (65 FE) MG tablet Take by mouth.   fluticasone (FLONASE) 50 MCG/ACT nasal spray Place 2 sprays into both nostrils daily.   folic acid  (FOLVITE ) 1 MG tablet TAKE 1 TABLET BY MOUTH EVERY DAY   glucose blood (ACCU-CHEK GUIDE) test strip Use as instructed to  monitor glucose twice daily, before breakfast and before bed   Insulin  Glargine (BASAGLAR  KWIKPEN) 100 UNIT/ML Inject 30 Units into the skin at bedtime.   Insulin  Pen Needle 31G X 5 MM MISC 1 Units by Does not apply route daily.   JANUVIA 100 MG tablet Take 100 mg by mouth daily.   JARDIANCE 25 MG TABS tablet Take 25 mg by mouth daily.   levETIRAcetam  (KEPPRA ) 500 MG tablet Take 1 tablet (500 mg total) by mouth 2 (two) times daily.   metoprolol  tartrate (LOPRESSOR ) 100 MG tablet Take 1 tablet (100 mg total) by mouth as directed. Take 2 Hours Prior to Cardiac CT Scan   Misc Natural Products (OSTEO BI-FLEX ADV DOUBLE ST PO) Take 1 tablet by mouth.   OneTouch Delica Lancets 33G MISC PLEASE SEE ATTACHED FOR DETAILED DIRECTIONS   pantoprazole  (PROTONIX ) 40 MG tablet TAKE 1 TABLET BY MOUTH 2 TIMES DAILY BEFORE A MEAL 30 MINUTES BEFORE BREAKFAST AND DINNER   REPATHA SURECLICK 140 MG/ML SOAJ INJECT 1 ML SUBCUTANEOUSLY EVERY 2 WEEKS   ticagrelor  (BRILINTA ) 90 MG TABS tablet Take 1 tablet (90 mg total) by mouth 2 (two) times daily.   No current facility-administered medications for this visit. (Other)   REVIEW OF SYSTEMS: ROS   Positive for: Cardiovascular, Eyes, Respiratory Negative for: Constitutional, Gastrointestinal, Neurological, Skin, Genitourinary, Musculoskeletal, HENT, Endocrine, Psychiatric, Allergic/Imm,  Heme/Lymph Last edited by Jose Koch ORN, COT on 09/12/2023  1:11 PM.      ALLERGIES Allergies  Allergen Reactions   Reglan  [Metoclopramide ] Other (See Comments)    Tremors, parkinson like symptoms    PAST MEDICAL HISTORY Past Medical History:  Diagnosis Date   COVID    COVID 02/2020   very sick - admitted to the hospital   Diabetes mellitus without complication (HCC)    GERD (gastroesophageal reflux disease)    Headache    High cholesterol    History of kidney stones    Hypertension    was diagnosed 2 years ago, never treated and states BP is always - documented  07/13/21   Ichthyosis vulgaris    Pneumonia    Sleep apnea    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   Past Surgical History:  Procedure Laterality Date   IR ANGIO INTRA EXTRACRAN SEL COM CAROTID INNOMINATE BILAT MOD SED  04/17/2021   IR ANGIO INTRA EXTRACRAN SEL INTERNAL CAROTID UNI R MOD SED  07/17/2021   IR ANGIO VERTEBRAL SEL VERTEBRAL BILAT MOD SED  04/17/2021   IR CT HEAD LTD  07/15/2021   IR INTRA CRAN STENT  07/15/2021   IR RADIOLOGIST EVAL & MGMT  03/05/2021   IR RADIOLOGIST EVAL & MGMT  05/04/2021   IR RADIOLOGIST EVAL & MGMT  07/31/2021   IR US  GUIDE VASC ACCESS RIGHT  04/17/2021   IR US  GUIDE VASC ACCESS RIGHT  07/15/2021   RADIOLOGY WITH ANESTHESIA N/A 07/15/2021   Procedure: STENTING;  Surgeon: Jose Carrion, MD;  Location: [facility] OR;  Service: Radiology;  Laterality: N/A;   TONSILLECTOMY     wisdom teeth removal     FAMILY HISTORY Family History  Problem Relation Age of Onset   Bursitis Mother    Bladder Cancer Mother 5       smoker   SOCIAL HISTORY Social History   Tobacco Use   Smoking status: Never   Smokeless tobacco: Never  Vaping Use   Vaping status: Never Used  Substance Use Topics   Alcohol use: Not Currently   Drug use: Never       OPHTHALMIC EXAM:  Base Eye Exam     Visual Acuity (Snellen - Linear)       Right Left   Dist cc 20/60 20/250 -1   Dist ph cc NI NI         Tonometry (Tonopen, 1:16 PM)       Right Left   Pressure 9 9         Pupils       Pupils Dark Light Shape React APD   Right PERRL 3 2 Round Brisk None   Left PERRL 3 2 Round Brisk None         Visual Fields       Left Right    Full Full         Extraocular Movement       Right Left    Full, Ortho Full, Ortho         Neuro/Psych     Oriented x3: Yes   Mood/Affect: Normal         Dilation     Both eyes: 2.5% Phenylephrine  @ 1:16 PM           Slit Lamp and Fundus Exam     Slit Lamp Exam       Right Left   Lids/Lashes  Dermatochalasis -  upper lid, Meibomian gland dysfunction, +Scurf Dermatochalasis - upper lid, Meibomian gland dysfunction, +Scurf   Conjunctiva/Sclera White and quiet White and quiet   Cornea trace PEE, mild tear film debris 1+PEE, mild tear film debris   Anterior Chamber deep and clear deep and clear   Iris Round and dilated, No NVI Round and dilated, No NVI   Lens Clear Clear   Anterior Vitreous Vitreous syneresis mild syneresis         Fundus Exam       Right Left   Disc Pink and Sharp, no heme Pink and sharp, fine NVD -- regressed, 360 exudates   C/D Ratio 0.2 0.2   Macula Blunted foveal reflex, severe edema with severe exudation -- improving, scattered DBH -- slightly improved Blunted foveal reflex, severe edema and exudation, scattered DBH -- improving   Vessels attenuated, Tortuous, +NVE greatest along inferior arcades -- improving, +Sheathing -- improving attenuated, Tortuous, +NV, +Sheathing of arterioles   Periphery Attached, scattered MA/DBH, heavy exudates greatest posteriorly Attached, severe exudation posteriorly, scattered MA / DBH           Refraction     Wearing Rx       Sphere Cylinder Axis Add   Right -2.50 +1.00 049 +1.00   Left -0.75 +1.25 178 +1.00            IMAGING AND PROCEDURES  Imaging and Procedures for 09/12/2023  OCT, Retina - OU - Both Eyes       Right Eye Quality was good. Central Foveal Thickness: 362. Progression has improved. Findings include no SRF, abnormal foveal contour, subretinal hyper-reflective material, intraretinal hyper-reflective material, intraretinal fluid, vitreomacular adhesion (Persistent IRF, IRHM and SRHM greatest IN macula--slightly improved).   Left Eye Quality was good. Central Foveal Thickness: 346. Progression has improved. Findings include no SRF, abnormal foveal contour, subretinal hyper-reflective material, intraretinal hyper-reflective material, epiretinal membrane, intraretinal fluid, lamellar hole,  macular pucker (Persistent edema, IRHM and SRHM -- slightly improved, +ERM with pucker).   Notes *Images captured and stored on drive  Diagnosis / Impression:  OD: Persistent IRF, IRHM and SRHM greatest IN macula--slightly improved  OS: Persistent edema, IRHM and SRHM -- slightly improved, +ERM with pucker  Clinical management:  See below  Abbreviations: NFP - Normal foveal profile. CME - cystoid macular edema. PED - pigment epithelial detachment. IRF - intraretinal fluid. SRF - subretinal fluid. EZ - ellipsoid zone. ERM - epiretinal membrane. ORA - outer retinal atrophy. ORT - outer retinal tubulation. SRHM - subretinal hyper-reflective material. IRHM - intraretinal hyper-reflective material      Intravitreal Injection, Pharmacologic Agent - OD - Right Eye       Time Out 09/12/2023. 2:13 PM. Confirmed correct patient, procedure, site, and patient consented.   Anesthesia Topical anesthesia was used. Anesthetic medications included Lidocaine  2%, Proparacaine 0.5%.   Procedure Preparation included 5% betadine to ocular surface, eyelid speculum. A supplied (32g) needle was used.   Injection: 6 mg faricimab -svoa 6 MG/0.05ML   Route: Intravitreal, Site: Right Eye   NDC: 49757-903-93, Lot: [id number], Expiration date: 06/21/2024, Waste: 0 mL   Post-op Post injection exam found visual acuity of at least counting fingers. The patient tolerated the procedure well. There were no complications. The patient received written and verbal post procedure care education.      Intravitreal Injection, Pharmacologic Agent - OS - Left Eye       Time Out 09/12/2023. 2:13 PM. Confirmed correct patient, procedure, site, and patient consented.   Anesthesia  Topical anesthesia was used. Anesthetic medications included Lidocaine  2%, Proparacaine 0.5%.   Procedure Preparation included 5% betadine to ocular surface, eyelid speculum. A supplied (32g) needle was used.   Injection: 6 mg faricimab -svoa 6  MG/0.05ML   Route: Intravitreal, Site: Left Eye   NDC: 49757-903-93, Lot: [id number], Expiration date: 09/20/2024, Waste: 0 mL   Post-op Post injection exam found visual acuity of at least counting fingers. The patient tolerated the procedure well. There were no complications. The patient received written and verbal post procedure care education.            ASSESSMENT/PLAN:    ICD-10-CM   1. Proliferative diabetic retinopathy of both eyes with macular edema associated with type 2 diabetes mellitus (HCC)  E11.3513 OCT, Retina - OU - Both Eyes    Intravitreal Injection, Pharmacologic Agent - OD - Right Eye    Intravitreal Injection, Pharmacologic Agent - OS - Left Eye    faricimab -svoa (VABYSMO ) 6mg /0.30mL intravitreal injection    faricimab -svoa (VABYSMO ) 6mg /0.76mL intravitreal injection    2. Current use of insulin  (HCC)  Z79.4     3. Long term (current) use of oral hypoglycemic drugs  Z79.84     4. Hyperlipidemia associated with type 2 diabetes mellitus (HCC)  E11.69    E78.5     5. Essential hypertension  I10     6. Hypertensive retinopathy of both eyes  H35.033      1-3. Proliferative diabetic retinopathy, both eyes  - A1c 7.4 on 08.11.23  - s/p IVA OS #1 (02.22.24), #2 (03.22.24), #3 (04.19.24)  -s/p IVA OD #1 (02.23.24), #2 (03.22.24) #3 (04.19.24), #4 (05.17.24), #5 (06.18.24), #6 (11.08.24) -- IVA resistance OU ==================================================  - s/p IVE OD #1 (06.18.24), #2 (07.16.24), #3 (08.13.24), #4 (09.10.24), #5 (10.08.24), #6 (11.08.24), #7 (12.06.24), #8 (01.03.25), #9 (01.31.25), #10 (02.28.25), #11 (03.28.25) - s/p IVE OS #1 (05.17.24), #2 (06.18.24), #3 (7.16.24), #4 (08.13.24), #5 (09.10.24), #6 (10.08.24), #7 (11.08.24), #8 (12.06.24), #9 (01.03.25), #10 (01.31.25), #11 (02.28.25), #12 (03.28.25) -- IVE resistance OU - IVV OU #1 (04.25.25), #2 (05.23.25), #3 (06.23.25) ===================================================== - exam shows  severe edema with scattered MA and DBH, but significant lipid component to edema OU - FA (02.21.24) shows OD: Large cluster of leaking NV along IT arcades, scattered patches of vascular non-perfusion; OS: +NVD and NVE along proximal arcades - BCVA OD 20/60 stable; OS 20/250 from 20/150 - OCT shows OD: Persistent IRF, IRHM and SRHM greatest IN macula--slightly improved; OS: Persistent edema, IRHM and SRHM -- slightly improved, +ERM with pucker at 4 wks - recommend IVV OU #4 today, 07.21.25 w/ f/u in 4 wks - pt wishes to proceed - RBA of procedure discussed, questions answered - IVV informed consent obtained and signed, 04.25.25 (OU) - see procedure note - Eylea  is approved for 2025, [email or web address] approved - Vabysmo  is approved for 2025, co-pay program approved - f/u 4 weeks -- DFE/OCT, possible injection OU -- possible FA  4. Hyperlipidemia  - review of labs shows severely elevated cholesterol and lipids  - 08.11.23:  Trig  785 Chol  598  LDL  410 - 01.17.25: Trig 335   Chol 279   LDL 173 - likely contributing to severe edema and exudates  5,6. Hypertensive retinopathy OU - discussed importance of tight BP control - monitor  Ophthalmic Meds Ordered this visit:  Meds ordered this encounter  Medications   faricimab -svoa (VABYSMO ) 6mg /0.88mL intravitreal injection   faricimab -svoa (VABYSMO ) 6mg /0.50mL intravitreal injection     Return in  about 4 weeks (around 10/10/2023) for PDR OU, DFE, OCT, Possible Injxn.  There are no Patient Instructions on file for this visit.  This document serves as a record of services personally performed by Redell JUDITHANN Hans, MD, PhD. It was created on their behalf by Avelina Pereyra, COA an ophthalmic technician. The creation of this record is the provider's dictation and/or activities during the visit.   Electronically signed by: Avelina GORMAN Pereyra, COT  09/12/23  7:20 PM    Redell JUDITHANN Hans, M.D., Ph.D. Diseases & Surgery of the Retina and Vitreous [facility]  I have reviewed the above documentation for accuracy and completeness, and I agree with the above. Redell JUDITHANN Hans, M.D., Ph.D. 09/12/23 7:20 PM   Abbreviations: M myopia (nearsighted); A astigmatism; H hyperopia (farsighted); P presbyopia; Mrx spectacle prescription;  CTL contact lenses; OD right eye; OS left eye; OU both eyes  XT exotropia; ET esotropia; PEK punctate epithelial keratitis; PEE punctate epithelial erosions; DES dry eye syndrome; MGD meibomian gland dysfunction; ATs artificial tears; PFAT's preservative free artificial tears; NSC nuclear sclerotic cataract; PSC posterior subcapsular cataract; ERM epi-retinal membrane; PVD posterior vitreous detachment; RD retinal detachment; DM diabetes mellitus; DR diabetic retinopathy; NPDR non-proliferative diabetic retinopathy; PDR proliferative diabetic retinopathy; CSME clinically significant macular edema; DME diabetic macular edema; dbh dot blot hemorrhages; CWS cotton wool spot; POAG primary open angle glaucoma; C/D cup-to-disc ratio; HVF humphrey visual field; GVF goldmann visual field; OCT optical coherence tomography; IOP intraocular pressure; BRVO Branch retinal vein occlusion; CRVO central retinal vein occlusion; CRAO central retinal artery occlusion; BRAO branch retinal artery occlusion; RT retinal tear; SB scleral buckle; PPV pars plana vitrectomy; VH Vitreous hemorrhage; PRP panretinal laser photocoagulation; IVK intravitreal kenalog; VMT vitreomacular traction; MH Macular hole;  NVD neovascularization of the disc; NVE neovascularization elsewhere; AREDS age related eye disease study; ARMD age related macular degeneration; POAG primary open angle glaucoma; EBMD epithelial/anterior basement membrane dystrophy; ACIOL anterior chamber intraocular lens; IOL intraocular lens; PCIOL posterior chamber intraocular lens; Phaco/IOL phacoemulsification with intraocular lens placement; PRK photorefractive keratectomy; LASIK  laser assisted in situ keratomileusis; HTN hypertension; DM diabetes mellitus; COPD chronic obstructive pulmonary disease

## 2023-09-05 ENCOUNTER — Ambulatory Visit: Admitting: Neurology

## 2023-09-07 ENCOUNTER — Telehealth (HOSPITAL_COMMUNITY): Payer: Self-pay | Admitting: *Deleted

## 2023-09-07 ENCOUNTER — Encounter (HOSPITAL_COMMUNITY): Payer: Self-pay

## 2023-09-07 NOTE — Telephone Encounter (Signed)
 Attempted to call patient regarding upcoming cardiac CT appointment. Left message on voicemail with name and callback number Johney Frame RN Navigator Cardiac Imaging [facility] [phone] Office

## 2023-09-08 ENCOUNTER — Ambulatory Visit (HOSPITAL_COMMUNITY)
Admission: RE | Admit: 2023-09-08 | Discharge: 2023-09-08 | Disposition: A | Discharge status: Home / Self Care | Payer: Self-pay | Source: Ambulatory Visit | Attending: Internal Medicine | Admitting: Internal Medicine

## 2023-09-08 DIAGNOSIS — K760 Fatty (change of) liver, not elsewhere classified: Secondary | ICD-10-CM | POA: Insufficient documentation

## 2023-09-08 DIAGNOSIS — I429 Cardiomyopathy, unspecified: Secondary | ICD-10-CM | POA: Diagnosis not present

## 2023-09-08 MED ORDER — IOHEXOL 350 MG/ML SOLN
100.0000 mL | Freq: Once | INTRAVENOUS | Status: AC | PRN
  Administered 2023-09-08: 100 mL via INTRAVENOUS

## 2023-09-08 MED ORDER — NITROGLYCERIN 0.4 MG SL SUBL
0.8000 mg | SUBLINGUAL_TABLET | Freq: Once | SUBLINGUAL | Status: AC
  Administered 2023-09-08: 0.8 mg via SUBLINGUAL

## 2023-09-09 ENCOUNTER — Other Ambulatory Visit (HOSPITAL_COMMUNITY): Payer: Self-pay | Admitting: *Deleted

## 2023-09-09 DIAGNOSIS — I519 Heart disease, unspecified: Secondary | ICD-10-CM

## 2023-09-09 DIAGNOSIS — I429 Cardiomyopathy, unspecified: Secondary | ICD-10-CM

## 2023-09-12 ENCOUNTER — Encounter (INDEPENDENT_AMBULATORY_CARE_PROVIDER_SITE_OTHER): Payer: Self-pay | Admitting: Ophthalmology

## 2023-09-12 ENCOUNTER — Ambulatory Visit (INDEPENDENT_AMBULATORY_CARE_PROVIDER_SITE_OTHER): Admitting: Ophthalmology

## 2023-09-12 DIAGNOSIS — E1122 Type 2 diabetes mellitus with diabetic chronic kidney disease: Secondary | ICD-10-CM | POA: Diagnosis not present

## 2023-09-12 DIAGNOSIS — I10 Essential (primary) hypertension: Secondary | ICD-10-CM

## 2023-09-12 DIAGNOSIS — Z794 Long term (current) use of insulin: Secondary | ICD-10-CM

## 2023-09-12 DIAGNOSIS — Z7984 Long term (current) use of oral hypoglycemic drugs: Secondary | ICD-10-CM

## 2023-09-12 DIAGNOSIS — N1832 Chronic kidney disease, stage 3b: Secondary | ICD-10-CM | POA: Diagnosis not present

## 2023-09-12 DIAGNOSIS — R809 Proteinuria, unspecified: Secondary | ICD-10-CM | POA: Diagnosis not present

## 2023-09-12 DIAGNOSIS — E113513 Type 2 diabetes mellitus with proliferative diabetic retinopathy with macular edema, bilateral: Secondary | ICD-10-CM

## 2023-09-12 DIAGNOSIS — E1169 Type 2 diabetes mellitus with other specified complication: Secondary | ICD-10-CM

## 2023-09-12 DIAGNOSIS — H35033 Hypertensive retinopathy, bilateral: Secondary | ICD-10-CM

## 2023-09-12 DIAGNOSIS — N189 Chronic kidney disease, unspecified: Secondary | ICD-10-CM | POA: Diagnosis not present

## 2023-09-12 MED ORDER — FARICIMAB-SVOA 6 MG/0.05ML IZ SOSY
6.0000 mg | PREFILLED_SYRINGE | INTRAVITREAL | Status: AC | PRN
  Administered 2023-09-12: 6 mg via INTRAVITREAL

## 2023-09-14 DIAGNOSIS — N509 Disorder of male genital organs, unspecified: Secondary | ICD-10-CM | POA: Diagnosis not present

## 2023-09-16 ENCOUNTER — Other Ambulatory Visit (HOSPITAL_COMMUNITY): Payer: Self-pay | Admitting: *Deleted

## 2023-09-16 MED ORDER — METOPROLOL TARTRATE 100 MG PO TABS: 100.0000 mg | ORAL_TABLET | ORAL | 0 refills | Status: DC

## 2023-09-20 ENCOUNTER — Telehealth (HOSPITAL_COMMUNITY): Payer: Self-pay | Admitting: *Deleted

## 2023-09-20 NOTE — Telephone Encounter (Signed)
 Attempted to call patient regarding upcoming cardiac CT appointment. Left message on voicemail with name and callback number Johney Frame RN Navigator Cardiac Imaging [facility] [phone] Office

## 2023-09-21 ENCOUNTER — Ambulatory Visit (HOSPITAL_COMMUNITY)
Admission: RE | Admit: 2023-09-21 | Discharge: 2023-09-21 | Disposition: A | Discharge status: Home / Self Care | Source: Ambulatory Visit | Attending: Cardiovascular Disease | Admitting: Cardiovascular Disease

## 2023-09-21 DIAGNOSIS — I429 Cardiomyopathy, unspecified: Secondary | ICD-10-CM | POA: Diagnosis not present

## 2023-09-21 DIAGNOSIS — I2510 Atherosclerotic heart disease of native coronary artery without angina pectoris: Secondary | ICD-10-CM | POA: Diagnosis not present

## 2023-09-21 MED ORDER — NITROGLYCERIN 0.4 MG SL SUBL
0.8000 mg | SUBLINGUAL_TABLET | Freq: Once | SUBLINGUAL | Status: AC
  Administered 2023-09-21: 0.8 mg via SUBLINGUAL

## 2023-09-21 MED ORDER — IOHEXOL 350 MG/ML SOLN
120.0000 mL | Freq: Once | INTRAVENOUS | Status: AC | PRN
  Administered 2023-09-21: 120 mL via INTRAVENOUS

## 2023-09-22 ENCOUNTER — Ambulatory Visit: Payer: Self-pay | Admitting: Internal Medicine

## 2023-09-22 MED ORDER — NITROGLYCERIN 0.4 MG SL SUBL
0.4000 mg | SUBLINGUAL_TABLET | SUBLINGUAL | 3 refills | Status: AC | PRN
  Filled 2023-11-18: qty 25, 1d supply, fill #0

## 2023-09-26 ENCOUNTER — Ambulatory Visit: Attending: Internal Medicine | Admitting: Internal Medicine

## 2023-09-26 ENCOUNTER — Encounter: Payer: Self-pay | Admitting: Internal Medicine

## 2023-09-26 VITALS — BP 100/68 | HR 95 | Ht 68.0 in | Wt 165.0 lb

## 2023-09-26 DIAGNOSIS — I10 Essential (primary) hypertension: Secondary | ICD-10-CM | POA: Diagnosis not present

## 2023-09-26 DIAGNOSIS — I5022 Chronic systolic (congestive) heart failure: Secondary | ICD-10-CM | POA: Diagnosis not present

## 2023-09-26 DIAGNOSIS — I2510 Atherosclerotic heart disease of native coronary artery without angina pectoris: Secondary | ICD-10-CM | POA: Diagnosis not present

## 2023-09-26 DIAGNOSIS — K219 Gastro-esophageal reflux disease without esophagitis: Secondary | ICD-10-CM | POA: Insufficient documentation

## 2023-09-26 DIAGNOSIS — K760 Fatty (change of) liver, not elsewhere classified: Secondary | ICD-10-CM | POA: Insufficient documentation

## 2023-09-26 MED ORDER — METOPROLOL SUCCINATE ER 25 MG PO TB24
12.5000 mg | ORAL_TABLET | Freq: Every day | ORAL | 2 refills | Status: AC
  Filled 2024-01-06: qty 45, 90d supply, fill #0

## 2023-09-26 MED ORDER — ICOSAPENT ETHYL 1 G PO CAPS
2.0000 g | ORAL_CAPSULE | Freq: Two times a day (BID) | ORAL | 5 refills | Status: AC
  Filled 2024-02-24 – 2024-02-27 (×2): qty 120, 30d supply, fill #0

## 2023-09-26 NOTE — Progress Notes (Signed)
 Cardiology Office Note  Date: 09/26/2023   ID: Jose Koch, DOB 1970-05-06, MRN [id number]  PCP:  Shona Norleen PEDLAR, MD  Cardiologist:  Diannah SHAUNNA Maywood, MD Electrophysiologist:  None   History of Present Illness: Jose Koch is a 53 y.o. male known to have moderate lower EXTR PAD, s/p R ICA stent, hypertriglyceridemia, hyperlipidemia, DM 2 is here for follow-up visit. accompanied by mother-in-law.  Initially referred to cardiology clinic for the management of hyper triglyceridemia.  Initial TG 335 after being on high intensity statin and fenofibrate but repeat TG was 446 in May 2025.  He was told he had a bulge in his heart for which echocardiogram was performed that showed LVEF 45% with hypokinesis of inferoseptal and inferior walls, normal RV function, no valvular heart disease and a CVP was 3 mm Hg.  Eventually CT cardiac was performed that showed coronary calcium score of 14.1, 68th percentile for age and sex matched control, occluded mid RCA with collateral filling of the distal RCA/PDA/PLV, occluded high OM1, severe hemodynamic significant stenosis of the proximal LAD.  This was a high risk study and cardiac catheterization was suggested.  Patient is here for follow-up visit.  Patient denies have any symptoms of angina or DOE.  No dizziness, syncope, leg swelling.  I reviewed her lipid panel from May 2025 that showed LDL 138, TG 446.  Previous lipid panel from January 2020 showed TG 335.  HbA1c was 9 ish.  Past Medical History:  Diagnosis Date   COVID    COVID 02/2020   very sick - admitted to the hospital   Diabetes mellitus without complication (HCC)    GERD (gastroesophageal reflux disease)    Headache    High cholesterol    History of kidney stones    Hypertension    was diagnosed 2 years ago, never treated and states BP is always - documented 07/13/21   Ichthyosis vulgaris    Pneumonia    Sleep apnea    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    Past Surgical  History:  Procedure Laterality Date   IR ANGIO INTRA EXTRACRAN SEL COM CAROTID INNOMINATE BILAT MOD SED  04/17/2021   IR ANGIO INTRA EXTRACRAN SEL INTERNAL CAROTID UNI R MOD SED  07/17/2021   IR ANGIO VERTEBRAL SEL VERTEBRAL BILAT MOD SED  04/17/2021   IR CT HEAD LTD  07/15/2021   IR INTRA CRAN STENT  07/15/2021   IR RADIOLOGIST EVAL & MGMT  03/05/2021   IR RADIOLOGIST EVAL & MGMT  05/04/2021   IR RADIOLOGIST EVAL & MGMT  07/31/2021   IR US  GUIDE VASC ACCESS RIGHT  04/17/2021   IR US  GUIDE VASC ACCESS RIGHT  07/15/2021   RADIOLOGY WITH ANESTHESIA N/A 07/15/2021   Procedure: STENTING;  Surgeon: Dolphus Carrion, MD;  Location: [facility] OR;  Service: Radiology;  Laterality: N/A;   TONSILLECTOMY     wisdom teeth removal      Current Outpatient Medications  Medication Sig Dispense Refill   aspirin  EC 81 MG tablet Take 81 mg by mouth daily. Swallow whole.     atorvastatin (LIPITOR) 40 MG tablet Take 40 mg by mouth daily.     blood glucose meter kit and supplies KIT Dispense based on patient and insurance preference (something with affordable test strips). Use to check glucose twice daily as directed. 1 each 0   carbidopa -levodopa  (SINEMET  IR) 25-100 MG tablet PLEASE SEE ATTACHED FOR DETAILED DIRECTIONS     fenofibrate 160 MG  tablet Take 160 mg by mouth daily.     ferrous sulfate 325 (65 FE) MG tablet Take by mouth.     fluticasone (FLONASE) 50 MCG/ACT nasal spray Place 2 sprays into both nostrils daily.     folic acid  (FOLVITE ) 1 MG tablet TAKE 1 TABLET BY MOUTH EVERY DAY 90 tablet 1   glucose blood (ACCU-CHEK GUIDE) test strip Use as instructed to monitor glucose twice daily, before breakfast and before bed 100 each 12   Insulin  Glargine (BASAGLAR  KWIKPEN) 100 UNIT/ML Inject 30 Units into the skin at bedtime.     Insulin  Pen Needle 31G X 5 MM MISC 1 Units by Does not apply route daily. 100 each 1   JANUVIA 100 MG tablet Take 100 mg by mouth daily.     JARDIANCE 25 MG TABS tablet Take 25 mg by mouth  daily.     levETIRAcetam  (KEPPRA ) 500 MG tablet Take 1 tablet (500 mg total) by mouth 2 (two) times daily. 180 tablet 3   metoprolol  tartrate (LOPRESSOR ) 100 MG tablet Take 1 tablet (100 mg total) by mouth as directed. Take 2 Hours Prior to Cardiac CT Scan 1 tablet 0   Misc Natural Products (OSTEO BI-FLEX ADV DOUBLE ST PO) Take 1 tablet by mouth.     nitroGLYCERIN  (NITROSTAT ) 0.4 MG SL tablet Place 1 tablet (0.4 mg total) under the tongue every 5 (five) minutes x 3 doses as needed (if no relief after 2nd dose, proceed to the ED or call 911). 25 tablet 3   OneTouch Delica Lancets 33G MISC PLEASE SEE ATTACHED FOR DETAILED DIRECTIONS     pantoprazole  (PROTONIX ) 40 MG tablet TAKE 1 TABLET BY MOUTH 2 TIMES DAILY BEFORE A MEAL 30 MINUTES BEFORE BREAKFAST AND DINNER 60 tablet 3   REPATHA SURECLICK 140 MG/ML SOAJ INJECT 1 ML SUBCUTANEOUSLY EVERY 2 WEEKS     ticagrelor  (BRILINTA ) 90 MG TABS tablet Take 1 tablet (90 mg total) by mouth 2 (two) times daily. 180 tablet 0   No current facility-administered medications for this visit.   Allergies:  Reglan  [metoclopramide ]   Social History: The patient  reports that he has never smoked. He has never used smokeless tobacco. He reports that he does not currently use alcohol. He reports that he does not use drugs.   Family History: The patient's family history includes Bladder Cancer (age of onset: 58) in his mother; Bursitis in his mother.   ROS:  Please see the history of present illness. Otherwise, complete review of systems is positive for none.  All other systems are reviewed and negative.   Physical Exam: VS:  There were no vitals taken for this visit., BMI There is no height or weight on file to calculate BMI.  Wt Readings from Last 3 Encounters:  06/16/23 165 lb (74.8 kg)  05/13/23 161 lb (73 kg)  05/05/23 163 lb 12.8 oz (74.3 kg)    General: Patient appears comfortable at rest. HEENT: Conjunctiva and lids normal, oropharynx clear with moist  mucosa. Neck: Supple, no elevated JVP or carotid bruits, no thyromegaly. Lungs: Clear to auscultation, nonlabored breathing at rest. Cardiac: Regular rate and rhythm, no S3 or significant systolic murmur, no pericardial rub. Abdomen: Soft, nontender, no hepatomegaly, bowel sounds present, no guarding or rebound. Extremities: No pitting edema, distal pulses 2+. Skin: Warm and dry. Musculoskeletal: No kyphosis. Neuropsychiatric: Alert and oriented x3, affect grossly appropriate.  Recent Labwork: 08/05/2023: ALT 26; AST 18; BUN 29; Creatinine, Ser 1.98; Hemoglobin 11.1; Platelets 187;  Potassium 4.6; Sodium 134     Component Value Date/Time   CHOL 492 (H) 04/14/2020 1551   TRIG 747 (H) 04/14/2020 1551   HDL 34 (L) 04/14/2020 1551   CHOLHDL 14.5 04/14/2020 1551   VLDL UNABLE TO CALCULATE IF TRIGLYCERIDE OVER 400 mg/dL 97/78/7977 8448   LDLCALC UNABLE TO CALCULATE IF TRIGLYCERIDE OVER 400 mg/dL 97/78/7977 8448   LDLDIRECT 106.3 (H) 04/14/2020 1551     Assessment and Plan:  Hypertriglyceridemia Hyperlipidemia - LDL 138 and TG 446 in 06/2023 - Continue atorvastatin 40 mg nightly, fenofibrate 160 mg once daily.  Start Vascepa  2 g twice daily. - General Testing for lipid disorders pending (sent out by PCP). - Low threshold to refer to lipid clinic - HbA1c was initially well-controlled but repeat labs showed HbA1c 9 ish and this could likely be secondary prednisone  use recently. he is off prednisone  now, repeat HbA1c will be checked soon.  Chronic systolic heart failure, compensated - No DOE, orthopnea, PND or leg swelling. - Echocardiogram showed LVEF 45% with hypokinesis of inferoseptal and inferior walls, normal RV function, no valvular heart disease and CVP 3 mmHg. - Blood pressure is too low to start any GDMT. - Start metoprolol  XL 12.5 mg once daily.  If he experiences any dizziness, okay to stop. - Not a candidate for ACE inhibitor/ARB/Arni/SGLT2 inhibitor/MRA due to CKD stage  IIIb-IV - Referred to cardiac rehab.  CAD - No angina. - CT cardiac showed coronary calcium score of 14.1, 68th percentile for age and sex matched control, occluded mid RCA with collateral filling of the distal RCA/PDA/PLV, occluded high OM1, severe hemodynamic significant stenosis of the proximal LAD.  This was a high risk study and cardiac catheterization was suggested.  - Due to no symptoms of angina or DOE and also CKD stage IIIb-IV, will defer LHC until he has symptoms or ACS event. - I discussed extensively with the patient and his mother-in-law about the symptoms of CAD and MI.  ER precautions for chest pain provided. - Refer to cardiac rehab.  S/p R ICA in 2023 - Currently on DAPT, aspirin  81 mg once daily and Brilinta  90 mg twice daily. - Follows with neurology. - Continue cardioprotective medications as above.  Moderate lower extremity PAD - He has bilateral lower EXTR claudication, L>R - Follows with vascular surgery. - Continue cardioprotective medications as above.   I spent 35 minutes with the patient reviewing the CT cardiac, echocardiogram reports, discussing management of CAD, systolic heart failure and hyperlipidemia.  Complex decision making involved.  10 to 15 minutes spent in documenting.  Medication Adjustments/Labs and Tests Ordered: Current medicines are reviewed at length with the patient today.  Concerns regarding medicines are outlined above.    Disposition:  Follow up 2 months  Signed, Raini Tiley Arleta Maywood, MD, 09/26/2023 1:38 PM    [facility] at [facility] [address], [place], [place] [address]

## 2023-09-26 NOTE — Patient Instructions (Addendum)
 Medication Instructions:  Your physician has recommended you make the following change in your medication:  Start taking Metoprolol  Succinate 12.5 mg once daily Vascepa  2g twice daily Continue taking all other medications as prescribed  Labwork: None  Testing/Procedures: None  Follow-Up: Your physician recommends that you schedule a follow-up appointment in: 8 weeks  Any Other Special Instructions Will Be Listed Below (If Applicable). Referral to Cardiac Rehab  Thank you for choosing [facility]!     If you need a refill on your cardiac medications before your next appointment, please call your pharmacy.

## 2023-09-26 NOTE — Progress Notes (Signed)
 [facility] - Clinic Note  10/10/2023     CHIEF COMPLAINT Patient presents for Retina Follow Up   HISTORY OF PRESENT ILLNESS: Jose Koch is a 53 y.o. male who presents to the clinic today for:   HPI     Retina Follow Up   Patient presents with  Diabetic Retinopathy.  In both eyes.  This started 4 weeks ago.  Duration of 4 weeks.  Since onset it is stable.        Comments   4 week retina follow up PDR OU and IVV OU pt is reporting no vision changes noticed he denies any floaters has some flashes       Last edited by Resa Delon ORN, COT on 10/10/2023  2:01 PM.      Pt states he feels he's reading a bit easier, clearer. Pt was told he had a 'bulge' on his heart which lead to further testing including a dye test. He has an 88% blockage but because of his kidneys they are hesitant to stent it. He's starting metoprolol  and another med tomorrow.   Referring physician: Shona Norleen PEDLAR, MD [address] [name],  [place] [address]  HISTORICAL INFORMATION:   Selected notes from the MEDICAL RECORD NUMBER Referred by Dr. Nanetta Sharps LEE:  Ocular Hx- PMH-    CURRENT MEDICATIONS: No current outpatient medications on file. (Ophthalmic Drugs)   No current facility-administered medications for this visit. (Ophthalmic Drugs)   Current Outpatient Medications (Other)  Medication Sig   aspirin  EC 81 MG tablet Take 81 mg by mouth daily. Swallow whole.   atorvastatin (LIPITOR) 40 MG tablet Take 40 mg by mouth daily.   blood glucose meter kit and supplies KIT Dispense based on patient and insurance preference (something with affordable test strips). Use to check glucose twice daily as directed.   fenofibrate 160 MG tablet Take 160 mg by mouth daily.   ferrous sulfate 325 (65 FE) MG tablet Take by mouth daily.   fluticasone (FLONASE) 50 MCG/ACT nasal spray Place 2 sprays into both nostrils as needed.   folic acid  (FOLVITE ) 1 MG tablet TAKE 1 TABLET BY  MOUTH EVERY DAY   glucose blood (ACCU-CHEK GUIDE) test strip Use as instructed to monitor glucose twice daily, before breakfast and before bed   icosapent  Ethyl (VASCEPA ) 1 g capsule Take 2 capsules (2 g total) by mouth 2 (two) times daily.   Insulin  Glargine (BASAGLAR  KWIKPEN) 100 UNIT/ML Inject 30 Units into the skin at bedtime.   Insulin  Pen Needle 31G X 5 MM MISC 1 Units by Does not apply route daily.   JANUVIA 100 MG tablet Take 100 mg by mouth daily.   JARDIANCE 25 MG TABS tablet Take 25 mg by mouth daily.   metoprolol  succinate (TOPROL  XL) 25 MG 24 hr tablet Take 0.5 tablets (12.5 mg total) by mouth daily.   Misc Natural Products (OSTEO BI-FLEX ADV DOUBLE ST PO) Take 1 tablet by mouth.   nitroGLYCERIN  (NITROSTAT ) 0.4 MG SL tablet Place 1 tablet (0.4 mg total) under the tongue every 5 (five) minutes x 3 doses as needed (if no relief after 2nd dose, proceed to the ED or call 911).   OneTouch Delica Lancets 33G MISC PLEASE SEE ATTACHED FOR DETAILED DIRECTIONS   pantoprazole  (PROTONIX ) 40 MG tablet TAKE 1 TABLET BY MOUTH 2 TIMES DAILY BEFORE A MEAL 30 MINUTES BEFORE BREAKFAST AND DINNER   REPATHA SURECLICK 140 MG/ML SOAJ INJECT 1 ML SUBCUTANEOUSLY  EVERY 2 WEEKS   ticagrelor  (BRILINTA ) 90 MG TABS tablet Take 1 tablet (90 mg total) by mouth 2 (two) times daily.   No current facility-administered medications for this visit. (Other)   REVIEW OF SYSTEMS: ROS   Positive for: Cardiovascular, Eyes, Respiratory Negative for: Constitutional, Gastrointestinal, Neurological, Skin, Genitourinary, Musculoskeletal, HENT, Endocrine, Psychiatric, Allergic/Imm, Heme/Lymph Last edited by Resa Delon ORN, COT on 10/10/2023  2:01 PM.       ALLERGIES Allergies  Allergen Reactions   Reglan  [Metoclopramide ] Other (See Comments)    Tremors, parkinson like symptoms    PAST MEDICAL HISTORY Past Medical History:  Diagnosis Date   COVID    COVID 02/2020   very sick - admitted to the hospital    Diabetes mellitus without complication (HCC)    GERD (gastroesophageal reflux disease)    Headache    High cholesterol    History of kidney stones    Hypertension    was diagnosed 2 years ago, never treated and states BP is always - documented 07/13/21   Ichthyosis vulgaris    Pneumonia    Sleep apnea    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   Past Surgical History:  Procedure Laterality Date   IR ANGIO INTRA EXTRACRAN SEL COM CAROTID INNOMINATE BILAT MOD SED  04/17/2021   IR ANGIO INTRA EXTRACRAN SEL INTERNAL CAROTID UNI R MOD SED  07/17/2021   IR ANGIO VERTEBRAL SEL VERTEBRAL BILAT MOD SED  04/17/2021   IR CT HEAD LTD  07/15/2021   IR INTRA CRAN STENT  07/15/2021   IR RADIOLOGIST EVAL & MGMT  03/05/2021   IR RADIOLOGIST EVAL & MGMT  05/04/2021   IR RADIOLOGIST EVAL & MGMT  07/31/2021   IR US  GUIDE VASC ACCESS RIGHT  04/17/2021   IR US  GUIDE VASC ACCESS RIGHT  07/15/2021   RADIOLOGY WITH ANESTHESIA N/A 07/15/2021   Procedure: STENTING;  Surgeon: Dolphus Carrion, MD;  Location: [facility] OR;  Service: Radiology;  Laterality: N/A;   TONSILLECTOMY     wisdom teeth removal     FAMILY HISTORY Family History  Problem Relation Age of Onset   Bursitis Mother    Bladder Cancer Mother 59       smoker   SOCIAL HISTORY Social History   Tobacco Use   Smoking status: Never   Smokeless tobacco: Never  Vaping Use   Vaping status: Never Used  Substance Use Topics   Alcohol use: Not Currently   Drug use: Never       OPHTHALMIC EXAM:  Base Eye Exam     Visual Acuity (Snellen - Linear)       Right Left   Dist cc 20/60 -1 20/200 -2   Dist ph cc NI NI    Correction: Glasses         Tonometry (Tonopen, 2:06 PM)       Right Left   Pressure 9 9         Pupils       Pupils Dark Light Shape React APD   Right PERRL 3 2 Round Brisk None   Left PERRL 3 2 Round Brisk None         Visual Fields       Left Right    Full Full         Extraocular Movement       Right  Left    Full, Ortho Full, Ortho         Neuro/Psych  Oriented x3: Yes   Mood/Affect: Normal         Dilation     Both eyes: 2.5% Phenylephrine  @ 2:06 PM           Slit Lamp and Fundus Exam     Slit Lamp Exam       Right Left   Lids/Lashes Dermatochalasis - upper lid, Meibomian gland dysfunction, +Scurf Dermatochalasis - upper lid, Meibomian gland dysfunction, +Scurf   Conjunctiva/Sclera White and quiet White and quiet   Cornea trace PEE, mild tear film debris 1+PEE, mild tear film debris   Anterior Chamber deep and clear deep and clear   Iris Round and dilated, No NVI Round and dilated, No NVI   Lens Clear Clear   Anterior Vitreous Vitreous syneresis mild syneresis         Fundus Exam       Right Left   Disc Pink and Sharp, no heme Pink and sharp, fine NVD -- regressed, 360 exudates   C/D Ratio 0.2 0.2   Macula Blunted foveal reflex, severe edema with severe exudation -- improving, scattered DBH -- slightly improved Blunted foveal reflex, severe edema and exudation, scattered DBH -- improving   Vessels attenuated, Tortuous, +NVE greatest along inferior arcades -- improving, +Sheathing -- improving attenuated, Tortuous, +NV, +Sheathing of arterioles   Periphery Attached, scattered MA/DBH, heavy exudates greatest posteriorly Attached, severe exudation posteriorly, scattered MA / DBH           Refraction     Wearing Rx       Sphere Cylinder Axis Add   Right -2.50 +1.00 049 +1.00   Left -0.75 +1.25 178 +1.00            IMAGING AND PROCEDURES  Imaging and Procedures for 10/10/2023          ASSESSMENT/PLAN:    ICD-10-CM   1. Proliferative diabetic retinopathy of both eyes with macular edema associated with type 2 diabetes mellitus (HCC)  E11.3513 OCT, Retina - OU - Both Eyes    2. Current use of insulin  (HCC)  Z79.4     3. Long term (current) use of oral hypoglycemic drugs  Z79.84     4. Hyperlipidemia associated with type 2 diabetes  mellitus (HCC)  E11.69    E78.5     5. Essential hypertension  I10     6. Hypertensive retinopathy of both eyes  H35.033       1-3. Proliferative diabetic retinopathy, both eyes  - A1c 9-pt reported on 08.18.25, 7.4 on 08.11.23  - s/p IVA OS #1 (02.22.24), #2 (03.22.24), #3 (04.19.24)  -s/p IVA OD #1 (02.23.24), #2 (03.22.24) #3 (04.19.24), #4 (05.17.24), #5 (06.18.24), #6 (11.08.24) -- IVA resistance OU ==================================================  - s/p IVE OD #1 (06.18.24), #2 (07.16.24), #3 (08.13.24), #4 (09.10.24), #5 (10.08.24), #6 (11.08.24), #7 (12.06.24), #8 (01.03.25), #9 (01.31.25), #10 (02.28.25), #11 (03.28.25) - s/p IVE OS #1 (05.17.24), #2 (06.18.24), #3 (7.16.24), #4 (08.13.24), #5 (09.10.24), #6 (10.08.24), #7 (11.08.24), #8 (12.06.24), #9 (01.03.25), #10 (01.31.25), #11 (02.28.25), #12 (03.28.25) -- IVE resistance OU - IVV OU #1 (04.25.25), #2 (05.23.25), #3 (06.23.25), #4 (07.21.25) ===================================================== - exam shows severe edema with scattered MA and DBH, but significant lipid component to edema OU - FA (02.21.24) shows OD: Large cluster of leaking NV along IT arcades, scattered patches of vascular non-perfusion; OS: +NVD and NVE along proximal arcades - BCVA OD 20/60 stable; OS 20/250 from 20/150 - OCT shows OD: Persistent IRF, IRHM and SRHM greatest  IN macula--slightly improved; OS: Persistent edema, IRHM and SRHM -- slightly improved, +ERM with pucker at 4 wks - recommend IVV OU today #5 (08.18.25) w/ f/u in 4 wks - pt wishes to proceed - RBA of procedure discussed, questions answered - IVV informed consent obtained and signed, 04.25.25 (OU) - see procedure note - Eylea  is approved for 2025, [email or web address] approved - Vabysmo  is approved for 2025, co-pay program approved - f/u 4 weeks -- DFE/OCT, possible injection OU -- possible FA  4. Hyperlipidemia  - review of labs shows severely elevated cholesterol and lipids  - 08.11.23:   Trig  785 Chol  598  LDL  410 - 01.17.25: Trig 335   Chol 279   LDL 173 - likely contributing to severe edema and exudates  5,6. Hypertensive retinopathy OU - discussed importance of tight BP control - monitor  Ophthalmic Meds Ordered this visit:  No orders of the defined types were placed in this encounter.    No follow-ups on file.  There are no Patient Instructions on file for this visit.  This document serves as a record of services personally performed by Redell JUDITHANN Hans, MD, PhD. It was created on their behalf by Avelina Pereyra, COA an ophthalmic technician. The creation of this record is the provider's dictation and/or activities during the visit.   Electronically signed by: Avelina GORMAN Pereyra, COT  10/10/23  3:06 PM    Redell JUDITHANN Hans, M.D., Ph.D. Diseases & Surgery of the Retina and Vitreous [facility]     Abbreviations: M myopia (nearsighted); A astigmatism; H hyperopia (farsighted); P presbyopia; Mrx spectacle prescription;  CTL contact lenses; OD right eye; OS left eye; OU both eyes  XT exotropia; ET esotropia; PEK punctate epithelial keratitis; PEE punctate epithelial erosions; DES dry eye syndrome; MGD meibomian gland dysfunction; ATs artificial tears; PFAT's preservative free artificial tears; NSC nuclear sclerotic cataract; PSC posterior subcapsular cataract; ERM epi-retinal membrane; PVD posterior vitreous detachment; RD retinal detachment; DM diabetes mellitus; DR diabetic retinopathy; NPDR non-proliferative diabetic retinopathy; PDR proliferative diabetic retinopathy; CSME clinically significant macular edema; DME diabetic macular edema; dbh dot blot hemorrhages; CWS cotton wool spot; POAG primary open angle glaucoma; C/D cup-to-disc ratio; HVF humphrey visual field; GVF goldmann visual field; OCT optical coherence tomography; IOP intraocular pressure; BRVO Branch retinal vein occlusion; CRVO central retinal vein occlusion; CRAO central retinal  artery occlusion; BRAO branch retinal artery occlusion; RT retinal tear; SB scleral buckle; PPV pars plana vitrectomy; VH Vitreous hemorrhage; PRP panretinal laser photocoagulation; IVK intravitreal kenalog; VMT vitreomacular traction; MH Macular hole;  NVD neovascularization of the disc; NVE neovascularization elsewhere; AREDS age related eye disease study; ARMD age related macular degeneration; POAG primary open angle glaucoma; EBMD epithelial/anterior basement membrane dystrophy; ACIOL anterior chamber intraocular lens; IOL intraocular lens; PCIOL posterior chamber intraocular lens; Phaco/IOL phacoemulsification with intraocular lens placement; PRK photorefractive keratectomy; LASIK laser assisted in situ keratomileusis; HTN hypertension; DM diabetes mellitus; COPD chronic obstructive pulmonary disease

## 2023-09-28 ENCOUNTER — Telehealth: Payer: Self-pay | Admitting: Pharmacy Technician

## 2023-09-28 NOTE — Telephone Encounter (Signed)
 Pharmacy Patient Advocate Encounter   Received notification from CoverMyMeds that prior authorization for vasecpa 1gm is required/requested.   Insurance verification completed.   The patient is insured through [facility] .   Per test claim: PA required; PA submitted to above mentioned insurance via latent Key/confirmation #/EOC [id number] Status is pending    Pharmacy Patient Advocate Encounter  Received notification from [facility] that Prior Authorization for vascepa  1gm has been APPROVED from 09/28/23 to 09/27/24   PA #/Case ID/Reference #: [id number]

## 2023-09-29 DIAGNOSIS — N1831 Chronic kidney disease, stage 3a: Secondary | ICD-10-CM | POA: Diagnosis not present

## 2023-09-29 DIAGNOSIS — N170 Acute kidney failure with tubular necrosis: Secondary | ICD-10-CM | POA: Diagnosis not present

## 2023-09-29 DIAGNOSIS — D631 Anemia in chronic kidney disease: Secondary | ICD-10-CM | POA: Diagnosis not present

## 2023-09-29 DIAGNOSIS — R809 Proteinuria, unspecified: Secondary | ICD-10-CM | POA: Diagnosis not present

## 2023-09-30 ENCOUNTER — Telehealth (HOSPITAL_COMMUNITY): Payer: Self-pay

## 2023-09-30 NOTE — Telephone Encounter (Signed)
 Pt insurance is active and benefits verified through [employer] Co-pay 0, DED $1,500/$1,500 met, out of pocket $7,800/$5,717.57 met, co-insurance 25%. no pre-authorization required, Vel/[employer]BS 09/30/2023@9 :55, REF# [id number]   TCR/ICR? ICR Visit(date of service)limitation? No limit Can multiple codes be used on the same date of service/visit?(IF ITS A LIMIT) n/a    Is this a lifetime maximum or an annual maximum? annual Has the member used any of these services to date? no Is there a time limit (weeks/months) on start of program and/or program completion? no

## 2023-09-30 NOTE — Telephone Encounter (Signed)
 Pt girlfriend stated that pt would like to come do his cardiac rehab here at [facility]. Will pass pt information to nurse navigator for review.

## 2023-10-06 DIAGNOSIS — R809 Proteinuria, unspecified: Secondary | ICD-10-CM | POA: Diagnosis not present

## 2023-10-06 DIAGNOSIS — N189 Chronic kidney disease, unspecified: Secondary | ICD-10-CM | POA: Diagnosis not present

## 2023-10-06 DIAGNOSIS — D631 Anemia in chronic kidney disease: Secondary | ICD-10-CM | POA: Diagnosis not present

## 2023-10-06 DIAGNOSIS — N1832 Chronic kidney disease, stage 3b: Secondary | ICD-10-CM | POA: Diagnosis not present

## 2023-10-07 ENCOUNTER — Telehealth (HOSPITAL_COMMUNITY): Payer: Self-pay

## 2023-10-07 NOTE — Telephone Encounter (Signed)
 Called patient to see if he was interested in participating in the Cardiac Rehab Program. Patient will come in for orientation on 8/26 and will attend the 12:30 exercise class.  Sent MyChart message.

## 2023-10-10 ENCOUNTER — Encounter (INDEPENDENT_AMBULATORY_CARE_PROVIDER_SITE_OTHER): Payer: Self-pay | Admitting: Ophthalmology

## 2023-10-10 ENCOUNTER — Ambulatory Visit (INDEPENDENT_AMBULATORY_CARE_PROVIDER_SITE_OTHER): Admitting: Ophthalmology

## 2023-10-10 DIAGNOSIS — Z7984 Long term (current) use of oral hypoglycemic drugs: Secondary | ICD-10-CM | POA: Diagnosis not present

## 2023-10-10 DIAGNOSIS — E113513 Type 2 diabetes mellitus with proliferative diabetic retinopathy with macular edema, bilateral: Secondary | ICD-10-CM | POA: Diagnosis not present

## 2023-10-10 DIAGNOSIS — Z794 Long term (current) use of insulin: Secondary | ICD-10-CM

## 2023-10-10 DIAGNOSIS — I10 Essential (primary) hypertension: Secondary | ICD-10-CM

## 2023-10-10 DIAGNOSIS — E1169 Type 2 diabetes mellitus with other specified complication: Secondary | ICD-10-CM

## 2023-10-10 DIAGNOSIS — H35033 Hypertensive retinopathy, bilateral: Secondary | ICD-10-CM

## 2023-10-14 ENCOUNTER — Encounter (INDEPENDENT_AMBULATORY_CARE_PROVIDER_SITE_OTHER): Payer: Self-pay | Admitting: Ophthalmology

## 2023-10-14 DIAGNOSIS — E113513 Type 2 diabetes mellitus with proliferative diabetic retinopathy with macular edema, bilateral: Secondary | ICD-10-CM | POA: Diagnosis not present

## 2023-10-14 MED ORDER — FARICIMAB-SVOA 6 MG/0.05ML IZ SOSY
6.0000 mg | PREFILLED_SYRINGE | INTRAVITREAL | Status: AC | PRN
  Administered 2023-10-14: 6 mg via INTRAVITREAL

## 2023-10-17 ENCOUNTER — Encounter (HOSPITAL_COMMUNITY): Payer: Self-pay

## 2023-10-18 ENCOUNTER — Encounter (HOSPITAL_COMMUNITY): Payer: Self-pay

## 2023-10-18 ENCOUNTER — Encounter (HOSPITAL_COMMUNITY)
Admission: RE | Admit: 2023-10-18 | Discharge: 2023-10-18 | Disposition: A | Discharge status: Home / Self Care | Source: Ambulatory Visit | Attending: Internal Medicine | Admitting: Internal Medicine

## 2023-10-18 VITALS — BP 114/80 | Ht 69.75 in | Wt 163.4 lb

## 2023-10-18 DIAGNOSIS — I5022 Chronic systolic (congestive) heart failure: Secondary | ICD-10-CM | POA: Insufficient documentation

## 2023-10-18 HISTORY — DX: Heart failure, unspecified: I50.9

## 2023-10-18 NOTE — Progress Notes (Signed)
 Cardiac Rehab Medication Review by a Nurse   Does the patient  feel that his/her medications are working for him/her?  yes  Has the patient been experiencing any side effects to the medications prescribed?  yes  Does the patient measure his/her own blood pressure or blood glucose at home?  yes   Does the patient have any problems obtaining medications due to transportation or finances?   no  Understanding of regimen: good Understanding of indications: excellent Potential of compliance: good    Nurse comments: Jose Koch is taking his medications as prescribed. Jose Koch has a good understanding of what his medications are for. Jose Koch says he sometimes has nausea from taking Vasepa. Jose Koch says that he checks his CBg's twice a day. Jose Koch has a BP cuff he does not check his blood pressure on a regular basis.Jose Jose Quan RN BSN     Jose Jose Koch 10/18/2023 2:58 PM

## 2023-10-18 NOTE — Progress Notes (Signed)
 Cardiac Individual Treatment Plan  Patient Details  Name: Oral Remache MRN: [id number] Date of Birth: February 10, 1971 Referring Provider:   Flowsheet Row INTENSIVE CARDIAC REHAB ORIENT from 10/18/2023 in [facility]  Referring Provider Diannah Maywood, MD    Initial Encounter Date:  Flowsheet Row INTENSIVE CARDIAC REHAB ORIENT from 10/18/2023 in [facility]  Date 10/18/23    Visit Diagnosis: Heart failure, chronic systolic (HCC)  Patient's Home Medications on Admission:  Current Outpatient Medications:    aspirin  EC 81 MG tablet, Take 81 mg by mouth daily. Swallow whole., Disp: , Rfl:    atorvastatin (LIPITOR) 40 MG tablet, Take 40 mg by mouth daily., Disp: , Rfl:    blood glucose meter kit and supplies KIT, Dispense based on patient and insurance preference (something with affordable test strips). Use to check glucose twice daily as directed., Disp: 1 each, Rfl: 0   fenofibrate 160 MG tablet, Take 160 mg by mouth daily., Disp: , Rfl:    ferrous sulfate 325 (65 FE) MG tablet, Take by mouth daily., Disp: , Rfl:    fluticasone (FLONASE) 50 MCG/ACT nasal spray, Place 2 sprays into both nostrils as needed., Disp: , Rfl:    folic acid  (FOLVITE ) 1 MG tablet, TAKE 1 TABLET BY MOUTH EVERY DAY, Disp: 90 tablet, Rfl: 1   glucose blood (ACCU-CHEK GUIDE) test strip, Use as instructed to monitor glucose twice daily, before breakfast and before bed, Disp: 100 each, Rfl: 12   icosapent  Ethyl (VASCEPA ) 1 g capsule, Take 2 capsules (2 g total) by mouth 2 (two) times daily., Disp: 120 capsule, Rfl: 5   Insulin  Glargine (BASAGLAR  KWIKPEN) 100 UNIT/ML, Inject 30 Units into the skin at bedtime., Disp: , Rfl:    Insulin  Pen Needle 31G X 5 MM MISC, 1 Units by Does not apply route daily., Disp: 100 each, Rfl: 1   JANUVIA 100 MG tablet, Take 100 mg by mouth daily., Disp: , Rfl:    JARDIANCE 25 MG  TABS tablet, Take 25 mg by mouth daily., Disp: , Rfl:    metoprolol  succinate (TOPROL  XL) 25 MG 24 hr tablet, Take 0.5 tablets (12.5 mg total) by mouth daily., Disp: 45 tablet, Rfl: 2   Misc Natural Products (OSTEO BI-FLEX ADV DOUBLE ST PO), Take 1 tablet by mouth., Disp: , Rfl:    nitroGLYCERIN  (NITROSTAT ) 0.4 MG SL tablet, Place 1 tablet (0.4 mg total) under the tongue every 5 (five) minutes x 3 doses as needed (if no relief after 2nd dose, proceed to the ED or call 911)., Disp: 25 tablet, Rfl: 3   OneTouch Delica Lancets 33G MISC, PLEASE SEE ATTACHED FOR DETAILED DIRECTIONS, Disp: , Rfl:    pantoprazole  (PROTONIX ) 40 MG tablet, TAKE 1 TABLET BY MOUTH 2 TIMES DAILY BEFORE A MEAL 30 MINUTES BEFORE BREAKFAST AND DINNER, Disp: 60 tablet, Rfl: 3   REPATHA SURECLICK 140 MG/ML SOAJ, INJECT 1 ML SUBCUTANEOUSLY EVERY 2 WEEKS, Disp: , Rfl:    ticagrelor  (BRILINTA ) 90 MG TABS tablet, Take 1 tablet (90 mg total) by mouth 2 (two) times daily., Disp: 180 tablet, Rfl: 0  Past Medical History: Past Medical History:  Diagnosis Date   CHF (congestive heart failure) (HCC)    COVID    COVID 02/2020   very sick - admitted to the hospital   Diabetes mellitus without complication (HCC)    GERD (gastroesophageal reflux disease)    Headache  High cholesterol    History of kidney stones    Hypertension    was diagnosed 2 years ago, never treated and states BP is always - documented 07/13/21   Ichthyosis vulgaris    Pneumonia    Sleep apnea    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    Tobacco Use: Social History   Tobacco Use  Smoking Status Never  Smokeless Tobacco Never    Labs: Review Flowsheet       Latest Ref Rng & Units 04/06/2020 04/14/2020 08/18/2020 06/12/2021  Labs for ITP Cardiac and Pulmonary Rehab  Cholestrol 0 - 200 mg/dL - 507  - -  LDL (calc) 0 - 99 mg/dL - UNABLE TO CALCULATE IF TRIGLYCERIDE OVER 400 mg/dL  - -  Direct LDL 0 - 99 mg/dL - 893.6  - -  HDL-C >59 mg/dL - 34  - -   Trlycerides <150 mg/dL - 252  - -  Hemoglobin A1c 4.8 - 5.6 % 12.0  - 12.5  8.1     Capillary Blood Glucose: Lab Results  Component Value Date   GLUCAP 213 (H) 07/16/2021   GLUCAP 218 (H) 07/15/2021   GLUCAP 164 (H) 07/15/2021   GLUCAP 133 (H) 07/15/2021   GLUCAP 131 (H) 07/15/2021     Exercise Target Goals: Exercise Program Goal: Individual exercise prescription set using results from initial 6 min walk test and THRR while considering  patient's activity barriers and safety.   Exercise Prescription Goal: Initial exercise prescription builds to 30-45 minutes a day of aerobic activity, 2-3 days per week.  Home exercise guidelines will be given to patient during program as part of exercise prescription that the participant will acknowledge.  Activity Barriers & Risk Stratification:  Activity Barriers & Cardiac Risk Stratification - 10/18/23 1249       Activity Barriers & Cardiac Risk Stratification   Activity Barriers Back Problems;Neck/Spine Problems;Deconditioning;Muscular Weakness;Decreased Ventricular Function;Balance Concerns    Cardiac Risk Stratification High          6 Minute Walk:  6 Minute Walk     Row Name 10/18/23 1243         6 Minute Walk   Phase Initial     Distance 1080 feet     Walk Time 6 minutes     # of Rest Breaks 1  5:52-6:00 due to ankle pain     MPH 2     METS 3.6     RPE 11     Perceived Dyspnea  0     VO2 Peak 12.5     Symptoms Yes (comment)     Comments Chronic Left ankle/calf pain 6/10;  chronic rt calf pain 3/10     Resting HR 95 bpm     Resting BP 114/80     Resting Oxygen Saturation  98 %     Exercise Oxygen Saturation  during 6 min walk 99 %     Max Ex. HR 108 bpm     Max Ex. BP 88/67  pt asymptomatic     2 Minute Post BP 115/75        Oxygen Initial Assessment:   Oxygen Re-Evaluation:   Oxygen Discharge (Final Oxygen Re-Evaluation):   Initial Exercise Prescription:  Initial Exercise Prescription - 10/18/23 1200        Date of Initial Exercise RX and Referring Provider   Date 10/18/23    Referring Provider Vishnu Mallipeddi, MD    Expected Discharge Date  01/13/24      Recumbant Bike   Level 1    RPM 60    Watts 36    Minutes 15    METs 3.6      T5 Nustep   Level 1    SPM 75    Minutes 15    METs 3.6      Prescription Details   Frequency (times per week) 3    Duration Progress to 30 minutes of continuous aerobic without signs/symptoms of physical distress      Intensity   THRR 40-80% of Max Heartrate 68-135    Ratings of Perceived Exertion 11-13    Perceived Dyspnea 0-4      Progression   Progression Continue progressive overload as per policy without signs/symptoms or physical distress.      Resistance Training   Training Prescription Yes    Weight 3lbs    Reps 10-15          Perform Capillary Blood Glucose checks as needed.  Exercise Prescription Changes:   Exercise Comments:   Exercise Goals and Review:   Exercise Goals     Row Name 10/18/23 1039             Exercise Goals   Increase Physical Activity Yes       Intervention Provide advice, education, support and counseling about physical activity/exercise needs.;Develop an individualized exercise prescription for aerobic and resistive training based on initial evaluation findings, risk stratification, comorbidities and participant's personal goals.       Expected Outcomes Short Term: Attend rehab on a regular basis to increase amount of physical activity.;Long Term: Add in home exercise to make exercise part of routine and to increase amount of physical activity.;Long Term: Exercising regularly at least 3-5 days a week.       Increase Strength and Stamina Yes       Intervention Provide advice, education, support and counseling about physical activity/exercise needs.;Develop an individualized exercise prescription for aerobic and resistive training based on initial evaluation findings, risk stratification,  comorbidities and participant's personal goals.       Expected Outcomes Short Term: Increase workloads from initial exercise prescription for resistance, speed, and METs.;Short Term: Perform resistance training exercises routinely during rehab and add in resistance training at home;Long Term: Improve cardiorespiratory fitness, muscular endurance and strength as measured by increased METs and functional capacity ( )       Able to understand and use rate of perceived exertion (RPE) scale Yes       Intervention Provide education and explanation on how to use RPE scale       Expected Outcomes Short Term: Able to use RPE daily in rehab to express subjective intensity level;Long Term:  Able to use RPE to guide intensity level when exercising independently       Knowledge and understanding of Target Heart Rate Range (THRR) Yes       Intervention Provide education and explanation of THRR including how the numbers were predicted and where they are located for reference       Expected Outcomes Short Term: Able to state/look up THRR;Short Term: Able to use daily as guideline for intensity in rehab;Long Term: Able to use THRR to govern intensity when exercising independently       Understanding of Exercise Prescription Yes       Intervention Provide education, explanation, and written materials on patient's individual exercise prescription       Expected Outcomes Short Term: Able to  explain program exercise prescription;Long Term: Able to explain home exercise prescription to exercise independently          Exercise Goals Re-Evaluation :   Discharge Exercise Prescription (Final Exercise Prescription Changes):   Nutrition:  Target Goals: Understanding of nutrition guidelines, daily intake of sodium 1500mg , cholesterol 200mg , calories 30% from fat and 7% or less from saturated fats, daily to have 5 or more servings of fruits and vegetables.  Biometrics:  Pre Biometrics - 10/18/23 1100       Pre  Biometrics   Waist Circumference 37.5 inches    Hip Circumference 39 inches    Waist to Hip Ratio 0.96 %    Triceps Skinfold 14 mm    % Body Fat 23.9 %    Grip Strength 23 kg    Flexibility 13.5 in    Single Leg Stand 8 seconds           Nutrition Therapy Plan and Nutrition Goals:   Nutrition Assessments:  MEDIFICTS Score Key: >=70 Need to make dietary changes  40-70 Heart Healthy Diet <= 40 Therapeutic Level Cholesterol Diet    Picture Your Plate Scores: <59 Unhealthy dietary pattern with much room for improvement. 41-50 Dietary pattern unlikely to meet recommendations for good health and room for improvement. 51-60 More healthful dietary pattern, with some room for improvement.  >60 Healthy dietary pattern, although there may be some specific behaviors that could be improved.    Nutrition Goals Re-Evaluation:   Nutrition Goals Re-Evaluation:   Nutrition Goals Discharge (Final Nutrition Goals Re-Evaluation):   Psychosocial: Target Goals: Acknowledge presence or absence of significant depression and/or stress, maximize coping skills, provide positive support system. Participant is able to verbalize types and ability to use techniques and skills needed for reducing stress and depression.  Initial Review & Psychosocial Screening:  Initial Psych Review & Screening - 10/18/23 1450       Initial Review   Current issues with Current Stress Concerns    Source of Stress Concerns Chronic Illness;Family    Comments Thaddaeus has mutiple health conditions. Thoma's fiancee is bedridden. Lex's finacee's Mom takes him to his appointments,      Family Dynamics   Good Support System? Yes   Ulus lives with his fiancee, his Fiancee's mother and father     Barriers   Psychosocial barriers to participate in program The patient should benefit from training in stress management and relaxation.      Screening Interventions   Interventions Encouraged to exercise;To provide  support and resources with identified psychosocial needs;Provide feedback about the scores to participant    Expected Outcomes Long Term Goal: Stressors or current issues are controlled or eliminated.;Short Term goal: Identification and review with participant of any Quality of Life or Depression concerns found by scoring the questionnaire.          Quality of Life Scores:  Quality of Life - 10/18/23 1123       Quality of Life   Select Quality of Life      Quality of Life Scores   Health/Function Pre 23.4 %    Socioeconomic Pre 27.14 %    Psych/Spiritual Pre 26.14 %    Family Pre 26.4 %    GLOBAL Pre 25.18 %         Scores of 19 and below usually indicate a poorer quality of life in these areas.  A difference of  2-3 points is a clinically meaningful difference.  A difference of 2-3 points  in the total score of the Quality of Life Index has been associated with significant improvement in overall quality of life, self-image, physical symptoms, and general health in studies assessing change in quality of life.  PHQ-9: Review Flowsheet       10/18/2023 05/26/2020 05/12/2020  Depression screen PHQ 2/9  Decreased Interest 0 0 0  Down, Depressed, Hopeless 0 0 0  PHQ - 2 Score 0 0 0  Altered sleeping 1 - -  Tired, decreased energy 1 - -  Change in appetite 0 - -  Feeling bad or failure about yourself  0 - -  Trouble concentrating 0 - -  Moving slowly or fidgety/restless 0 - -  Suicidal thoughts 0 - -  PHQ-9 Score 2 - -  Difficult doing work/chores Not difficult at all - -   Interpretation of Total Score  Total Score Depression Severity:  1-4 = Minimal depression, 5-9 = Mild depression, 10-14 = Moderate depression, 15-19 = Moderately severe depression, 20-27 = Severe depression   Psychosocial Evaluation and Intervention:   Psychosocial Re-Evaluation:   Psychosocial Discharge (Final Psychosocial Re-Evaluation):   Vocational Rehabilitation: Provide vocational rehab  assistance to qualifying candidates.   Vocational Rehab Evaluation & Intervention:  Vocational Rehab - 10/18/23 1452       Initial Vocational Rehab Evaluation & Intervention   Assessment shows need for Vocational Rehabilitation No   Iniko is unemployed and is not interested in vocational rehab at this time         Education: Education Goals: Education classes will be provided on a weekly basis, covering required topics. Participant will state understanding/return demonstration of topics presented.     Core Videos: Exercise    Move It!  Clinical staff conducted group or individual video education with verbal and written material and guidebook.  Patient learns the recommended Pritikin exercise program. Exercise with the goal of living a long, healthy life. Some of the health benefits of exercise include controlled diabetes, healthier blood pressure levels, improved cholesterol levels, improved heart and lung capacity, improved sleep, and better body composition. Everyone should speak with their doctor before starting or changing an exercise routine.  Biomechanical Limitations Clinical staff conducted group or individual video education with verbal and written material and guidebook.  Patient learns how biomechanical limitations can impact exercise and how we can mitigate and possibly overcome limitations to have an impactful and balanced exercise routine.  Body Composition Clinical staff conducted group or individual video education with verbal and written material and guidebook.  Patient learns that body composition (ratio of muscle mass to fat mass) is a key component to assessing overall fitness, rather than body weight alone. Increased fat mass, especially visceral belly fat, can put us  at increased risk for metabolic syndrome, type 2 diabetes, heart disease, and even death. It is recommended to combine diet and exercise (cardiovascular and resistance training) to improve your body  composition. Seek guidance from your physician and exercise physiologist before implementing an exercise routine.  Exercise Action Plan Clinical staff conducted group or individual video education with verbal and written material and guidebook.  Patient learns the recommended strategies to achieve and enjoy long-term exercise adherence, including variety, self-motivation, self-efficacy, and positive decision making. Benefits of exercise include fitness, good health, weight management, more energy, better sleep, less stress, and overall well-being.  Medical   Heart Disease Risk Reduction Clinical staff conducted group or individual video education with verbal and written material and guidebook.  Patient learns our heart is  our most vital organ as it circulates oxygen, nutrients, white blood cells, and hormones throughout the entire body, and carries waste away. Data supports a plant-based eating plan like the Pritikin Program for its effectiveness in slowing progression of and reversing heart disease. The video provides a number of recommendations to address heart disease.   Metabolic Syndrome and Belly Fat  Clinical staff conducted group or individual video education with verbal and written material and guidebook.  Patient learns what metabolic syndrome is, how it leads to heart disease, and how one can reverse it and keep it from coming back. You have metabolic syndrome if you have 3 of the following 5 criteria: abdominal obesity, high blood pressure, high triglycerides, low HDL cholesterol, and high blood sugar.  Hypertension and Heart Disease Clinical staff conducted group or individual video education with verbal and written material and guidebook.  Patient learns that high blood pressure, or hypertension, is very common in the [place] . Hypertension is largely due to excessive salt intake, but other important risk factors include being overweight, physical inactivity, drinking too much  alcohol, smoking, and not eating enough potassium from fruits and vegetables. High blood pressure is a leading risk factor for heart attack, stroke, congestive heart failure, dementia, kidney failure, and premature death. Long-term effects of excessive salt intake include stiffening of the arteries and thickening of heart muscle and organ damage. Recommendations include ways to reduce hypertension and the risk of heart disease.  Diseases of Our Time - Focusing on Diabetes Clinical staff conducted group or individual video education with verbal and written material and guidebook.  Patient learns why the best way to stop diseases of our time is prevention, through food and other lifestyle changes. Medicine (such as prescription pills and surgeries) is often only a Band-Aid on the problem, not a long-term solution. Most common diseases of our time include obesity, type 2 diabetes, hypertension, heart disease, and cancer. The Pritikin Program is recommended and has been proven to help reduce, reverse, and/or prevent the damaging effects of metabolic syndrome.  Nutrition   Overview of the Pritikin Eating Plan  Clinical staff conducted group or individual video education with verbal and written material and guidebook.  Patient learns about the Pritikin Eating Plan for disease risk reduction. The Pritikin Eating Plan emphasizes a wide variety of unrefined, minimally-processed carbohydrates, like fruits, vegetables, whole grains, and legumes. Go, Caution, and Stop food choices are explained. Plant-based and lean animal proteins are emphasized. Rationale provided for low sodium intake for blood pressure control, low added sugars for blood sugar stabilization, and low added fats and oils for coronary artery disease risk reduction and weight management.  Calorie Density  Clinical staff conducted group or individual video education with verbal and written material and guidebook.  Patient learns about calorie  density and how it impacts the Pritikin Eating Plan. Knowing the characteristics of the food you choose will help you decide whether those foods will lead to weight gain or weight loss, and whether you want to consume more or less of them. Weight loss is usually a side effect of the Pritikin Eating Plan because of its focus on low calorie-dense foods.  Label Reading  Clinical staff conducted group or individual video education with verbal and written material and guidebook.  Patient learns about the Pritikin recommended label reading guidelines and corresponding recommendations regarding calorie density, added sugars, sodium content, and whole grains.  Dining Out - Part 1  Clinical staff conducted group or individual video education  with verbal and written material and guidebook.  Patient learns that restaurant meals can be sabotaging because they can be so high in calories, fat, sodium, and/or sugar. Patient learns recommended strategies on how to positively address this and avoid unhealthy pitfalls.  Facts on Fats  Clinical staff conducted group or individual video education with verbal and written material and guidebook.  Patient learns that lifestyle modifications can be just as effective, if not more so, as many medications for lowering your risk of heart disease. A Pritikin lifestyle can help to reduce your risk of inflammation and atherosclerosis (cholesterol build-up, or plaque, in the artery walls). Lifestyle interventions such as dietary choices and physical activity address the cause of atherosclerosis. A review of the types of fats and their impact on blood cholesterol levels, along with dietary recommendations to reduce fat intake is also included.  Nutrition Action Plan  Clinical staff conducted group or individual video education with verbal and written material and guidebook.  Patient learns how to incorporate Pritikin recommendations into their lifestyle. Recommendations include  planning and keeping personal health goals in mind as an important part of their success.  Healthy Mind-Set    Healthy Minds, Bodies, Hearts  Clinical staff conducted group or individual video education with verbal and written material and guidebook.  Patient learns how to identify when they are stressed. Video will discuss the impact of that stress, as well as the many benefits of stress management. Patient will also be introduced to stress management techniques. The way we think, act, and feel has an impact on our hearts.  How Our Thoughts Can Heal Our Hearts  Clinical staff conducted group or individual video education with verbal and written material and guidebook.  Patient learns that negative thoughts can cause depression and anxiety. This can result in negative lifestyle behavior and serious health problems. Cognitive behavioral therapy is an effective method to help control our thoughts in order to change and improve our emotional outlook.  Additional Videos:  Exercise    Improving Performance  Clinical staff conducted group or individual video education with verbal and written material and guidebook.  Patient learns to use a non-linear approach by alternating intensity levels and lengths of time spent exercising to help burn more calories and lose more body fat. Cardiovascular exercise helps improve heart health, metabolism, hormonal balance, blood sugar control, and recovery from fatigue. Resistance training improves strength, endurance, balance, coordination, reaction time, metabolism, and muscle mass. Flexibility exercise improves circulation, posture, and balance. Seek guidance from your physician and exercise physiologist before implementing an exercise routine and learn your capabilities and proper form for all exercise.  Introduction to Yoga  Clinical staff conducted group or individual video education with verbal and written material and guidebook.  Patient learns about yoga, a  discipline of the coming together of mind, breath, and body. The benefits of yoga include improved flexibility, improved range of motion, better posture and core strength, increased lung function, weight loss, and positive self-image. Yoga's heart health benefits include lowered blood pressure, healthier heart rate, decreased cholesterol and triglyceride levels, improved immune function, and reduced stress. Seek guidance from your physician and exercise physiologist before implementing an exercise routine and learn your capabilities and proper form for all exercise.  Medical   Aging: Enhancing Your Quality of Life  Clinical staff conducted group or individual video education with verbal and written material and guidebook.  Patient learns key strategies and recommendations to stay in good physical health and enhance quality of  life, such as prevention strategies, having an advocate, securing a Health Care Proxy and Power of Attorney, and keeping a list of medications and system for tracking them. It also discusses how to avoid risk for bone loss.  Biology of Weight Control  Clinical staff conducted group or individual video education with verbal and written material and guidebook.  Patient learns that weight gain occurs because we consume more calories than we burn (eating more, moving less). Even if your body weight is normal, you may have higher ratios of fat compared to muscle mass. Too much body fat puts you at increased risk for cardiovascular disease, heart attack, stroke, type 2 diabetes, and obesity-related cancers. In addition to exercise, following the Pritikin Eating Plan can help reduce your risk.  Decoding Lab Results  Clinical staff conducted group or individual video education with verbal and written material and guidebook.  Patient learns that lab test reflects one measurement whose values change over time and are influenced by many factors, including medication, stress, sleep, exercise,  food, hydration, pre-existing medical conditions, and more. It is recommended to use the knowledge from this video to become more involved with your lab results and evaluate your numbers to speak with your doctor.   Diseases of Our Time - Overview  Clinical staff conducted group or individual video education with verbal and written material and guidebook.  Patient learns that according to the CDC, 50% to 70% of chronic diseases (such as obesity, type 2 diabetes, elevated lipids, hypertension, and heart disease) are avoidable through lifestyle improvements including healthier food choices, listening to satiety cues, and increased physical activity.  Sleep Disorders Clinical staff conducted group or individual video education with verbal and written material and guidebook.  Patient learns how good quality and duration of sleep are important to overall health and well-being. Patient also learns about sleep disorders and how they impact health along with recommendations to address them, including discussing with a physician.  Nutrition  Dining Out - Part 2 Clinical staff conducted group or individual video education with verbal and written material and guidebook.  Patient learns how to plan ahead and communicate in order to maximize their dining experience in a healthy and nutritious manner. Included are recommended food choices based on the type of restaurant the patient is visiting.   Fueling a Banker conducted group or individual video education with verbal and written material and guidebook.  There is a strong connection between our food choices and our health. Diseases like obesity and type 2 diabetes are very prevalent and are in large-part due to lifestyle choices. The Pritikin Eating Plan provides plenty of food and hunger-curbing satisfaction. It is easy to follow, affordable, and helps reduce health risks.  Menu Workshop  Clinical staff conducted group or individual  video education with verbal and written material and guidebook.  Patient learns that restaurant meals can sabotage health goals because they are often packed with calories, fat, sodium, and sugar. Recommendations include strategies to plan ahead and to communicate with the manager, chef, or server to help order a healthier meal.  Planning Your Eating Strategy  Clinical staff conducted group or individual video education with verbal and written material and guidebook.  Patient learns about the Pritikin Eating Plan and its benefit of reducing the risk of disease. The Pritikin Eating Plan does not focus on calories. Instead, it emphasizes high-quality, nutrient-rich foods. By knowing the characteristics of the foods, we choose, we can determine their calorie density  and make informed decisions.  Targeting Your Nutrition Priorities  Clinical staff conducted group or individual video education with verbal and written material and guidebook.  Patient learns that lifestyle habits have a tremendous impact on disease risk and progression. This video provides eating and physical activity recommendations based on your personal health goals, such as reducing LDL cholesterol, losing weight, preventing or controlling type 2 diabetes, and reducing high blood pressure.  Vitamins and Minerals  Clinical staff conducted group or individual video education with verbal and written material and guidebook.  Patient learns different ways to obtain key vitamins and minerals, including through a recommended healthy diet. It is important to discuss all supplements you take with your doctor.   Healthy Mind-Set    Smoking Cessation  Clinical staff conducted group or individual video education with verbal and written material and guidebook.  Patient learns that cigarette smoking and tobacco addiction pose a serious health risk which affects millions of people. Stopping smoking will significantly reduce the risk of heart  disease, lung disease, and many forms of cancer. Recommended strategies for quitting are covered, including working with your doctor to develop a successful plan.  Culinary   Becoming a Set designer conducted group or individual video education with verbal and written material and guidebook.  Patient learns that cooking at home can be healthy, cost-effective, quick, and puts them in control. Keys to cooking healthy recipes will include looking at your recipe, assessing your equipment needs, planning ahead, making it simple, choosing cost-effective seasonal ingredients, and limiting the use of added fats, salts, and sugars.  Cooking - Breakfast and Snacks  Clinical staff conducted group or individual video education with verbal and written material and guidebook.  Patient learns how important breakfast is to satiety and nutrition through the entire day. Recommendations include key foods to eat during breakfast to help stabilize blood sugar levels and to prevent overeating at meals later in the day. Planning ahead is also a key component.  Cooking - Educational psychologist conducted group or individual video education with verbal and written material and guidebook.  Patient learns eating strategies to improve overall health, including an approach to cook more at home. Recommendations include thinking of animal protein as a side on your plate rather than center stage and focusing instead on lower calorie dense options like vegetables, fruits, whole grains, and plant-based proteins, such as beans. Making sauces in large quantities to freeze for later and leaving the skin on your vegetables are also recommended to maximize your experience.  Cooking - Healthy Salads and Dressing Clinical staff conducted group or individual video education with verbal and written material and guidebook.  Patient learns that vegetables, fruits, whole grains, and legumes are the foundations of the  Pritikin Eating Plan. Recommendations include how to incorporate each of these in flavorful and healthy salads, and how to create homemade salad dressings. Proper handling of ingredients is also covered. Cooking - Soups and [employer] - Soups and Desserts Clinical staff conducted group or individual video education with verbal and written material and guidebook.  Patient learns that Pritikin soups and desserts make for easy, nutritious, and delicious snacks and meal components that are low in sodium, fat, sugar, and calorie density, while high in vitamins, minerals, and filling fiber. Recommendations include simple and healthy ideas for soups and desserts.   Overview     The Pritikin Solution Program Overview Clinical staff conducted group or individual video education with  verbal and written material and guidebook.  Patient learns that the results of the Pritikin Program have been documented in more than 100 articles published in peer-reviewed journals, and the benefits include reducing risk factors for (and, in some cases, even reversing) high cholesterol, high blood pressure, type 2 diabetes, obesity, and more! An overview of the three key pillars of the Pritikin Program will be covered: eating well, doing regular exercise, and having a healthy mind-set.  WORKSHOPS  Exercise: Exercise Basics: Building Your Action Plan Clinical staff led group instruction and group discussion with PowerPoint presentation and patient guidebook. To enhance the learning environment the use of posters, models and videos may be added. At the conclusion of this workshop, patients will comprehend the difference between physical activity and exercise, as well as the benefits of incorporating both, into their routine. Patients will understand the FITT (Frequency, Intensity, Time, and Type) principle and how to use it to build an exercise action plan. In addition, safety concerns and other considerations for  exercise and cardiac rehab will be addressed by the presenter. The purpose of this lesson is to promote a comprehensive and effective weekly exercise routine in order to improve patients' overall level of fitness.   Managing Heart Disease: Your Path to a Healthier Heart Clinical staff led group instruction and group discussion with PowerPoint presentation and patient guidebook. To enhance the learning environment the use of posters, models and videos may be added.At the conclusion of this workshop, patients will understand the anatomy and physiology of the heart. Additionally, they will understand how Pritikin's three pillars impact the risk factors, the progression, and the management of heart disease.  The purpose of this lesson is to provide a high-level overview of the heart, heart disease, and how the Pritikin lifestyle positively impacts risk factors.  Exercise Biomechanics Clinical staff led group instruction and group discussion with PowerPoint presentation and patient guidebook. To enhance the learning environment the use of posters, models and videos may be added. Patients will learn how the structural parts of their bodies function and how these functions impact their daily activities, movement, and exercise. Patients will learn how to promote a neutral spine, learn how to manage pain, and identify ways to improve their physical movement in order to promote healthy living. The purpose of this lesson is to expose patients to common physical limitations that impact physical activity. Participants will learn practical ways to adapt and manage aches and pains, and to minimize their effect on regular exercise. Patients will learn how to maintain good posture while sitting, walking, and lifting.  Balance Training and Fall Prevention  Clinical staff led group instruction and group discussion with PowerPoint presentation and patient guidebook. To enhance the learning environment the use of  posters, models and videos may be added. At the conclusion of this workshop, patients will understand the importance of their sensorimotor skills (vision, proprioception, and the vestibular system) in maintaining their ability to balance as they age. Patients will apply a variety of balancing exercises that are appropriate for their current level of function. Patients will understand the common causes for poor balance, possible solutions to these problems, and ways to modify their physical environment in order to minimize their fall risk. The purpose of this lesson is to teach patients about the importance of maintaining balance as they age and ways to minimize their risk of falling.  WORKSHOPS   Nutrition:  Fueling a Ship broker led group instruction and group discussion with PowerPoint presentation  and patient guidebook. To enhance the learning environment the use of posters, models and videos may be added. Patients will review the foundational principles of the Pritikin Eating Plan and understand what constitutes a serving size in each of the food groups. Patients will also learn Pritikin-friendly foods that are better choices when away from home and review make-ahead meal and snack options. Calorie density will be reviewed and applied to three nutrition priorities: weight maintenance, weight loss, and weight gain. The purpose of this lesson is to reinforce (in a group setting) the key concepts around what patients are recommended to eat and how to apply these guidelines when away from home by planning and selecting Pritikin-friendly options. Patients will understand how calorie density may be adjusted for different weight management goals.  Mindful Eating  Clinical staff led group instruction and group discussion with PowerPoint presentation and patient guidebook. To enhance the learning environment the use of posters, models and videos may be added. Patients will briefly review the  concepts of the Pritikin Eating Plan and the importance of low-calorie dense foods. The concept of mindful eating will be introduced as well as the importance of paying attention to internal hunger signals. Triggers for non-hunger eating and techniques for dealing with triggers will be explored. The purpose of this lesson is to provide patients with the opportunity to review the basic principles of the Pritikin Eating Plan, discuss the value of eating mindfully and how to measure internal cues of hunger and fullness using the Hunger Scale. Patients will also discuss reasons for non-hunger eating and learn strategies to use for controlling emotional eating.  Targeting Your Nutrition Priorities Clinical staff led group instruction and group discussion with PowerPoint presentation and patient guidebook. To enhance the learning environment the use of posters, models and videos may be added. Patients will learn how to determine their genetic susceptibility to disease by reviewing their family history. Patients will gain insight into the importance of diet as part of an overall healthy lifestyle in mitigating the impact of genetics and other environmental insults. The purpose of this lesson is to provide patients with the opportunity to assess their personal nutrition priorities by looking at their family history, their own health history and current risk factors. Patients will also be able to discuss ways of prioritizing and modifying the Pritikin Eating Plan for their highest risk areas  Menu  Clinical staff led group instruction and group discussion with PowerPoint presentation and patient guidebook. To enhance the learning environment the use of posters, models and videos may be added. Using menus brought in from [employer], or printed from [employer], patients will apply the Pritikin dining out guidelines that were presented in the [employer] video. Patients will also be able to  practice these guidelines in a variety of provided scenarios. The purpose of this lesson is to provide patients with the opportunity to practice hands-on learning of the Pritikin Dining Out guidelines with actual menus and practice scenarios.  Label Reading Clinical staff led group instruction and group discussion with PowerPoint presentation and patient guidebook. To enhance the learning environment the use of posters, models and videos may be added. Patients will review and discuss the Pritikin label reading guidelines presented in Pritikin's Label Reading Educational series video. Using fool labels brought in from local grocery stores and markets, patients will apply the label reading guidelines and determine if the packaged food meet the Pritikin guidelines. The purpose of this lesson is to provide patients with  the opportunity to review, discuss, and practice hands-on learning of the Pritikin Label Reading guidelines with actual packaged food labels. Cooking School  Pritikin's [employer] are designed to teach patients ways to prepare quick, simple, and affordable recipes at home. The importance of nutrition's role in chronic disease risk reduction is reflected in its emphasis in the overall Pritikin program. By learning how to prepare essential core Pritikin Eating Plan recipes, patients will increase control over what they eat; be able to customize the flavor of foods without the use of added salt, sugar, or fat; and improve the quality of the food they consume. By learning a set of core recipes which are easily assembled, quickly prepared, and affordable, patients are more likely to prepare more healthy foods at home. These workshops focus on convenient breakfasts, simple entres, side dishes, and desserts which can be prepared with minimal effort and are consistent with nutrition recommendations for cardiovascular risk reduction. Cooking [employer] are taught by a Interior and spatial designer (RD) who has been trained by the [employer]. The chef or RD has a clear understanding of the importance of minimizing - if not completely eliminating - added fat, sugar, and sodium in recipes. Throughout the series of Cooking School Workshop sessions, patients will learn about healthy ingredients and efficient methods of cooking to build confidence in their capability to prepare    Cooking School weekly topics:  Adding Flavor- Sodium-Free  Fast and Healthy Breakfasts  Powerhouse Plant-Based Proteins  Satisfying Salads and Dressings  Simple Sides and Sauces  International Cuisine-Spotlight on the [employer] Zones  Delicious Desserts  Savory Soups  [employer] - Meals in a Astronomer Appetizers and Snacks  Comforting Weekend Breakfasts  One-Pot Wonders   Fast Evening Meals  Landscape architect Your Pritikin Plate  WORKSHOPS   Healthy Mindset (Psychosocial):  Focused Goals, Sustainable Changes Clinical staff led group instruction and group discussion with PowerPoint presentation and patient guidebook. To enhance the learning environment the use of posters, models and videos may be added. Patients will be able to apply effective goal setting strategies to establish at least one personal goal, and then take consistent, meaningful action toward that goal. They will learn to identify common barriers to achieving personal goals and develop strategies to overcome them. Patients will also gain an understanding of how our mind-set can impact our ability to achieve goals and the importance of cultivating a positive and growth-oriented mind-set. The purpose of this lesson is to provide patients with a deeper understanding of how to set and achieve personal goals, as well as the tools and strategies needed to overcome common obstacles which may arise along the way.  From Head to Heart: The Power of a Healthy Outlook  Clinical staff led group instruction and  group discussion with PowerPoint presentation and patient guidebook. To enhance the learning environment the use of posters, models and videos may be added. Patients will be able to recognize and describe the impact of emotions and mood on physical health. They will discover the importance of self-care and explore self-care practices which may work for them. Patients will also learn how to utilize the 4 C's to cultivate a healthier outlook and better manage stress and challenges. The purpose of this lesson is to demonstrate to patients how a healthy outlook is an essential part of maintaining good health, especially as they continue their cardiac rehab journey.  Healthy Sleep for a Healthy Heart Clinical staff led  group instruction and group discussion with PowerPoint presentation and patient guidebook. To enhance the learning environment the use of posters, models and videos may be added. At the conclusion of this workshop, patients will be able to demonstrate knowledge of the importance of sleep to overall health, well-being, and quality of life. They will understand the symptoms of, and treatments for, common sleep disorders. Patients will also be able to identify daytime and nighttime behaviors which impact sleep, and they will be able to apply these tools to help manage sleep-related challenges. The purpose of this lesson is to provide patients with a general overview of sleep and outline the importance of quality sleep. Patients will learn about a few of the most common sleep disorders. Patients will also be introduced to the concept of "sleep hygiene," and discover ways to self-manage certain sleeping problems through simple daily behavior changes. Finally, the workshop will motivate patients by clarifying the links between quality sleep and their goals of heart-healthy living.   Recognizing and Reducing Stress Clinical staff led group instruction and group discussion with PowerPoint presentation and  patient guidebook. To enhance the learning environment the use of posters, models and videos may be added. At the conclusion of this workshop, patients will be able to understand the types of stress reactions, differentiate between acute and chronic stress, and recognize the impact that chronic stress has on their health. They will also be able to apply different coping mechanisms, such as reframing negative self-talk. Patients will have the opportunity to practice a variety of stress management techniques, such as deep abdominal breathing, progressive muscle relaxation, and/or guided imagery.  The purpose of this lesson is to educate patients on the role of stress in their lives and to provide healthy techniques for coping with it.  Learning Barriers/Preferences:  Learning Barriers/Preferences - 10/18/23 1305       Learning Barriers/Preferences   Learning Barriers Sight;Hearing    Learning Preferences Skilled Demonstration;Individual Instruction;Pictoral;Group Instruction          Education Topics:  Knowledge Questionnaire Score:  Knowledge Questionnaire Score - 10/18/23 1306       Knowledge Questionnaire Score   Pre Score 23/24          Core Components/Risk Factors/Patient Goals at Admission:  Personal Goals and Risk Factors at Admission - 10/18/23 1427       Core Components/Risk Factors/Patient Goals on Admission   Diabetes Yes    Intervention Provide education about signs/symptoms and action to take for hypo/hyperglycemia.;Provide education about proper nutrition, including hydration, and aerobic/resistive exercise prescription along with prescribed medications to achieve blood glucose in normal ranges: Fasting glucose 65-99 mg/dL    Expected Outcomes Short Term: Participant verbalizes understanding of the signs/symptoms and immediate care of hyper/hypoglycemia, proper foot care and importance of medication, aerobic/resistive exercise and nutrition plan for blood glucose  control.;Long Term: Attainment of HbA1C < 7%.    Heart Failure Yes    Intervention Provide a combined exercise and nutrition program that is supplemented with education, support and counseling about heart failure. Directed toward relieving symptoms such as shortness of breath, decreased exercise tolerance, and extremity edema.    Expected Outcomes Improve functional capacity of life;Short term: Attendance in program 2-3 days a week with increased exercise capacity. Reported lower sodium intake. Reported increased fruit and vegetable intake. Reports medication compliance.;Short term: Daily weights obtained and reported for increase. Utilizing diuretic protocols set by physician.;Long term: Adoption of self-care skills and reduction of barriers for early signs and symptoms  recognition and intervention leading to self-care maintenance.    Hypertension Yes    Intervention Provide education on lifestyle modifcations including regular physical activity/exercise, weight management, moderate sodium restriction and increased consumption of fresh fruit, vegetables, and low fat dairy, alcohol moderation, and smoking cessation.;Monitor prescription use compliance.    Expected Outcomes Short Term: Continued assessment and intervention until BP is < 140/46mm HG in hypertensive participants. < 130/97mm HG in hypertensive participants with diabetes, heart failure or chronic kidney disease.;Long Term: Maintenance of blood pressure at goal levels.    Lipids Yes    Intervention Provide education and support for participant on nutrition & aerobic/resistive exercise along with prescribed medications to achieve LDL 70mg , HDL >40mg .    Expected Outcomes Short Term: Participant states understanding of desired cholesterol values and is compliant with medications prescribed. Participant is following exercise prescription and nutrition guidelines.;Long Term: Cholesterol controlled with medications as prescribed, with individualized  exercise RX and with personalized nutrition plan. Value goals: LDL < 70mg , HDL > 40 mg.    Stress Yes    Intervention Offer individual and/or small group education and counseling on adjustment to heart disease, stress management and health-related lifestyle change. Teach and support self-help strategies.;Refer participants experiencing significant psychosocial distress to appropriate mental health specialists for further evaluation and treatment. When possible, include family members and significant others in education/counseling sessions.    Expected Outcomes Short Term: Participant demonstrates changes in health-related behavior, relaxation and other stress management skills, ability to obtain effective social support, and compliance with psychotropic medications if prescribed.;Long Term: Emotional wellbeing is indicated by absence of clinically significant psychosocial distress or social isolation.          Core Components/Risk Factors/Patient Goals Review:    Core Components/Risk Factors/Patient Goals at Discharge (Final Review):    ITP Comments:  ITP Comments     Row Name 10/18/23 1430           ITP Comments Wilbert Bihari, MD: Medical director. Intorduction to the [facility] Initial orientation packet reviewed with the patient.          Comments: Participant attended orientation for the cardiac rehabilitation program on  10/18/2023  to perform initial intake and exercise walk test. Patient introduced to the [facility] education and orientation packet was reviewed. Completed 6-minute walk test, measurements, initial ITP, and exercise prescription. Vital signs stable. Telemetry-normal sinus rhythm, asymptomatic.   Service time was from 9:26 to 12:34.

## 2023-10-20 ENCOUNTER — Ambulatory Visit (HOSPITAL_BASED_OUTPATIENT_CLINIC_OR_DEPARTMENT_OTHER): Admitting: Internal Medicine

## 2023-10-20 ENCOUNTER — Encounter (HOSPITAL_BASED_OUTPATIENT_CLINIC_OR_DEPARTMENT_OTHER): Payer: Self-pay | Admitting: Internal Medicine

## 2023-10-20 VITALS — BP 101/67 | HR 97 | Ht 68.0 in | Wt 163.6 lb

## 2023-10-20 DIAGNOSIS — Z79899 Other long term (current) drug therapy: Secondary | ICD-10-CM | POA: Diagnosis not present

## 2023-10-20 DIAGNOSIS — I2510 Atherosclerotic heart disease of native coronary artery without angina pectoris: Secondary | ICD-10-CM | POA: Diagnosis not present

## 2023-10-20 DIAGNOSIS — E781 Pure hyperglyceridemia: Secondary | ICD-10-CM

## 2023-10-20 DIAGNOSIS — E785 Hyperlipidemia, unspecified: Secondary | ICD-10-CM | POA: Diagnosis not present

## 2023-10-20 NOTE — Patient Instructions (Addendum)
 Medication Instructions:   Your physician recommends that you continue on your current medications as directed. Please refer to the Current Medication list given to you today.  *If you need a refill on your cardiac medications before your next appointment, please call your pharmacy*  Lab Work:  IN ONE MONTH (AROUND 9/28 OR AFTER) AT A [facility]--WE HAVE ONE HERE ON THE 3RD FLOOR AT [facility] [address]--LIPIDS, LDL DIRECT, AND LIPOPROTEIN A--PLEASE COME FASTING TO THIS LAB APPOINTMENT  If you have labs (blood work) drawn today and your tests are completely normal, you will receive your results only by: MyChart Message (if you have MyChart) OR A paper copy in the mail If you have any lab test that is abnormal or we need to change your treatment, we will call you to review the results.    Follow-Up:  AS SCHEDULED WITH DR. MALLIPEDDI

## 2023-10-20 NOTE — Progress Notes (Signed)
 LIPID CLINIC CONSULT NOTE  Chief Complaint:  Manage dyslipidemia  Primary Care Physician: Shona Norleen PEDLAR, MD  Primary Cardiologist:  Vishnu P Mallipeddi, MD  HPI:  Jose Koch is a 53 y.o. male who is being seen today for the evaluation of dyslipidemia at the request of [name], FNP. This is a pleasant 53 year old male kindly referred for evaluation management of dyslipidemia.  Specifically has had a history of high cholesterol and high triglycerides.  He also has a history of PAD and congestive heart failure.  He was recently seen by Dr. Mallipeddi and she added Vascepa  to his regimen.  He is only been on that now for about 3 weeks.  He has not had repeat lipids but does have a follow-up with her in September.  Overall he reports tolerating that well.  He is also on therapy with high-dose atorvastatin 40 mg daily and fenofibrate 160 mg daily.  He was written for Repatha but has not started that medication.  He is relative told me at the appointment that they recently had genetic testing and that those results would likely be available in September and they are waiting for it currently.  This testing was apparently through a [facility] affiliated genetic testing program in [place] but was not covered by insurance and cost was close to $800.  PMHx:  Past Medical History:  Diagnosis Date   CHF (congestive heart failure) (HCC)    COVID    COVID 02/2020   very sick - admitted to the hospital   Diabetes mellitus without complication (HCC)    GERD (gastroesophageal reflux disease)    Headache    High cholesterol    History of kidney stones    Hypertension    was diagnosed 2 years ago, never treated and states BP is always - documented 07/13/21   Ichthyosis vulgaris    Pneumonia    Sleep apnea    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    Past Surgical History:  Procedure Laterality Date   CARDIAC CATHETERIZATION     IR ANGIO INTRA EXTRACRAN SEL COM CAROTID  INNOMINATE BILAT MOD SED  04/17/2021   IR ANGIO INTRA EXTRACRAN SEL INTERNAL CAROTID UNI R MOD SED  07/17/2021   IR ANGIO VERTEBRAL SEL VERTEBRAL BILAT MOD SED  04/17/2021   IR CT HEAD LTD  07/15/2021   IR INTRA CRAN STENT  07/15/2021   IR RADIOLOGIST EVAL & MGMT  03/05/2021   IR RADIOLOGIST EVAL & MGMT  05/04/2021   IR RADIOLOGIST EVAL & MGMT  07/31/2021   IR US  GUIDE VASC ACCESS RIGHT  04/17/2021   IR US  GUIDE VASC ACCESS RIGHT  07/15/2021   RADIOLOGY WITH ANESTHESIA N/A 07/15/2021   Procedure: STENTING;  Surgeon: Dolphus Carrion, MD;  Location: [facility] OR;  Service: Radiology;  Laterality: N/A;   TONSILLECTOMY     wisdom teeth removal      FAMHx:  Family History  Problem Relation Age of Onset   Bursitis Mother    Bladder Cancer Mother 23       smoker    SOCHx:   reports that he has never smoked. He has never used smokeless tobacco. He reports that he does not currently use alcohol. He reports that he does not use drugs.  ALLERGIES:  Allergies  Allergen Reactions   Reglan  [Metoclopramide ] Other (See Comments)    Tremors, parkinson like symptoms     ROS: Pertinent items noted in HPI and remainder  of comprehensive ROS otherwise negative.  HOME MEDS: Current Outpatient Medications on File Prior to Visit  Medication Sig Dispense Refill   aspirin  EC 81 MG tablet Take 81 mg by mouth daily. Swallow whole.     atorvastatin (LIPITOR) 40 MG tablet Take 40 mg by mouth daily.     blood glucose meter kit and supplies KIT Dispense based on patient and insurance preference (something with affordable test strips). Use to check glucose twice daily as directed. 1 each 0   fenofibrate 160 MG tablet Take 160 mg by mouth daily.     ferrous sulfate 325 (65 FE) MG tablet Take by mouth daily.     fluticasone (FLONASE) 50 MCG/ACT nasal spray Place 2 sprays into both nostrils as needed.     folic acid  (FOLVITE ) 1 MG tablet TAKE 1 TABLET BY MOUTH EVERY DAY 90 tablet 1   glucose blood (ACCU-CHEK  GUIDE) test strip Use as instructed to monitor glucose twice daily, before breakfast and before bed 100 each 12   icosapent  Ethyl (VASCEPA ) 1 g capsule Take 2 capsules (2 g total) by mouth 2 (two) times daily. 120 capsule 5   Insulin  Glargine (BASAGLAR  KWIKPEN) 100 UNIT/ML Inject 30 Units into the skin at bedtime.     Insulin  Pen Needle 31G X 5 MM MISC 1 Units by Does not apply route daily. 100 each 1   JANUVIA 100 MG tablet Take 100 mg by mouth daily.     JARDIANCE 25 MG TABS tablet Take 25 mg by mouth daily.     metoprolol  succinate (TOPROL  XL) 25 MG 24 hr tablet Take 0.5 tablets (12.5 mg total) by mouth daily. 45 tablet 2   Misc Natural Products (OSTEO BI-FLEX ADV DOUBLE ST PO) Take 1 tablet by mouth.     nitroGLYCERIN  (NITROSTAT ) 0.4 MG SL tablet Place 1 tablet (0.4 mg total) under the tongue every 5 (five) minutes x 3 doses as needed (if no relief after 2nd dose, proceed to the ED or call 911). 25 tablet 3   OneTouch Delica Lancets 33G MISC PLEASE SEE ATTACHED FOR DETAILED DIRECTIONS     pantoprazole  (PROTONIX ) 40 MG tablet TAKE 1 TABLET BY MOUTH 2 TIMES DAILY BEFORE A MEAL 30 MINUTES BEFORE BREAKFAST AND DINNER 60 tablet 3   sodium bicarbonate 650 MG tablet Take 1,300 mg by mouth.     ticagrelor  (BRILINTA ) 90 MG TABS tablet Take 1 tablet (90 mg total) by mouth 2 (two) times daily. 180 tablet 0   REPATHA SURECLICK 140 MG/ML SOAJ INJECT 1 ML SUBCUTANEOUSLY EVERY 2 WEEKS (Patient not taking: Reported on 10/20/2023)     No current facility-administered medications on file prior to visit.    LABS/IMAGING: No results found for this or any previous visit (from the past 48 hours). No results found.  LIPID PANEL:    Component Value Date/Time   CHOL 492 (H) 04/14/2020 1551   TRIG 747 (H) 04/14/2020 1551   HDL 34 (L) 04/14/2020 1551   CHOLHDL 14.5 04/14/2020 1551   VLDL UNABLE TO CALCULATE IF TRIGLYCERIDE OVER 400 mg/dL 97/78/7977 8448   LDLCALC UNABLE TO CALCULATE IF TRIGLYCERIDE OVER 400  mg/dL 97/78/7977 8448   LDLDIRECT 106.3 (H) 04/14/2020 1551    No results found for: LIPOA   WEIGHTS: Wt Readings from Last 3 Encounters:  10/20/23 163 lb 9.6 oz (74.2 kg)  10/18/23 163 lb 5.8 oz (74.1 kg)  09/26/23 165 lb (74.8 kg)    VITALS: BP 101/67 (BP Location: Right  Arm, Patient Position: Sitting, Cuff Size: Normal)   Pulse 97   Ht 5' 8 (1.727 m)   Wt 163 lb 9.6 oz (74.2 kg)   SpO2 97%   BMI 24.88 kg/m   EXAM: Deferred  EKG: Deferred  ASSESSMENT: 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   Mr. Wendorff has high cholesterol with elevated triglycerides.  Recent lipids in February showed total cholesterol 492, triglycerides 747, HDL 34 and direct LDL 106.  He should try to target LDL to less than 55.  I think he would be a good candidate to add Repatha.  It would be helpful to get an LP(a) as well.  Since he recently started on Vascepa , I provided lipid lab work for 2 to 3 months however he may want to get that prior to his visit to see Dr. Mallipeddi and if his lipids are still not at goal, would consider starting Repatha as well.  Thanks for the kind referral.  Follow-up with me as needed.  Jose KYM Maxcy, MD, [facility], FNLA, FACP  [place]  [facility]  Medical Director of the Advanced Lipid Disorders &  Cardiovascular Risk Reduction Clinic Diplomate of the American Board of Clinical Lipidology Attending Cardiologist  Direct Dial: [phone]  Fax: [phone]  Website:  [email or web address] 10/20/2023, 7:24 PM

## 2023-10-26 ENCOUNTER — Encounter (HOSPITAL_COMMUNITY)
Admission: RE | Admit: 2023-10-26 | Discharge: 2023-10-26 | Disposition: A | Discharge status: Home / Self Care | Source: Ambulatory Visit | Attending: Internal Medicine | Admitting: Internal Medicine

## 2023-10-26 DIAGNOSIS — I5022 Chronic systolic (congestive) heart failure: Secondary | ICD-10-CM | POA: Diagnosis not present

## 2023-10-26 DIAGNOSIS — Z794 Long term (current) use of insulin: Secondary | ICD-10-CM | POA: Insufficient documentation

## 2023-10-26 DIAGNOSIS — Z7984 Long term (current) use of oral hypoglycemic drugs: Secondary | ICD-10-CM | POA: Insufficient documentation

## 2023-10-26 DIAGNOSIS — E1151 Type 2 diabetes mellitus with diabetic peripheral angiopathy without gangrene: Secondary | ICD-10-CM | POA: Diagnosis not present

## 2023-10-26 DIAGNOSIS — E781 Pure hyperglyceridemia: Secondary | ICD-10-CM | POA: Insufficient documentation

## 2023-10-26 DIAGNOSIS — Z955 Presence of coronary angioplasty implant and graft: Secondary | ICD-10-CM | POA: Diagnosis not present

## 2023-10-26 DIAGNOSIS — Z7902 Long term (current) use of antithrombotics/antiplatelets: Secondary | ICD-10-CM | POA: Insufficient documentation

## 2023-10-26 DIAGNOSIS — Z7982 Long term (current) use of aspirin: Secondary | ICD-10-CM | POA: Insufficient documentation

## 2023-10-26 DIAGNOSIS — I2510 Atherosclerotic heart disease of native coronary artery without angina pectoris: Secondary | ICD-10-CM | POA: Diagnosis not present

## 2023-10-26 NOTE — Progress Notes (Signed)
 Daily Session Note  Patient Details  Name: Jose Koch MRN: [id number] Date of Birth: August 10, 1970 Referring Provider:   Flowsheet Row INTENSIVE CARDIAC REHAB ORIENT from 10/18/2023 in [facility]  Referring Provider Diannah Maywood, MD    Encounter Date: 10/26/2023  Check In:  Session Check In - 10/26/23 1249       Check-In   Supervising physician immediately available to respond to emergencies [facility] MD immediately available    Physician(s) Josefa Beauvais NP    Location [facility]    Staff Present Hadassah Quan, RN, Mallory Parkins, MS, ACSM-CEP, CCRP, Exercise Physiologist;Johnny Fayette, MS, Exercise Physiologist;Jetta Vannie BS, ACSM-CEP, Exercise Physiologist;Jacquline Terrill Lennon, RN, Avonne Gal, MS, ACSM-CEP, Exercise Physiologist    Virtual Visit No    Medication changes reported     No    Fall or balance concerns reported    No    Tobacco Cessation No Change    Current number of cigarettes/nicotine per day     0    Warm-up and Cool-down Performed as group-led instruction    Resistance Training Performed No    VAD Patient? No    PAD/SET Patient? No      Pain Assessment   Currently in Pain? No/denies    Pain Score 0-No pain    Pain Onset More than a month ago    Multiple Pain Sites No          Capillary Blood Glucose: No results found for this or any previous visit (from the past 24 hours).   Exercise Prescription Changes - 10/26/23 1600       Response to Exercise   Blood Pressure (Admit) 110/64    Blood Pressure (Exercise) 120/70    Blood Pressure (Exit) 101/71    Heart Rate (Admit) 88 bpm    Heart Rate (Exercise) 106 bpm    Heart Rate (Exit) 97 bpm    Rating of Perceived Exertion (Exercise) 12    Symptoms Left ankle pain    Comments Pt's first day in the [facility] program    Duration Continue with 30 min of aerobic exercise without signs/symptoms of physical distress.     Intensity THRR unchanged      Progression   Progression Continue to progress workloads to maintain intensity without signs/symptoms of physical distress.    Average METs 1.7      Resistance Training   Training Prescription No    Weight No wts on Wednesday      Interval Training   Interval Training No      Recumbant Bike   Level 1    RPM 48    Watts 10    Minutes 15    METs 1.8      T5 Nustep   Level 1    SPM 56    Minutes 15    METs 1.6          Social History   Tobacco Use  Smoking Status Never  Smokeless Tobacco Never    Goals Met:  Exercise tolerated well No report of concerns or symptoms today  Goals Unmet:  Not Applicable  Comments: Pt started cardiac rehab today.  Pt tolerated light exercise without difficulty. VSS, telemetry-NSR, asymptomatic.  Medication list reconciled. Pt denies barriers to medication compliance.  PSYCHOSOCIAL ASSESSMENT:  PHQ-2. Pt exhibits positive coping skills, hopeful outlook with supportive family. No psychosocial needs identified at this time, no psychosocial interventions necessary.  Pt enjoys reading, and legos.   Pt oriented to exercise equipment and routine.    Understanding verbalized.     Dr. Wilbert Bihari is Medical Director for Cardiac Rehab at [facility].

## 2023-10-26 NOTE — Progress Notes (Signed)
 [facility] - Clinic Note  11/07/2023     CHIEF COMPLAINT Patient presents for Retina Follow Up   HISTORY OF PRESENT ILLNESS: Jose Koch is a 53 y.o. male who presents to the clinic today for:   HPI     Retina Follow Up   Patient presents with  Diabetic Retinopathy.  In both eyes.  This started 4 weeks ago.  Duration of 4 weeks.  Since onset it is stable.  I, the attending physician,  performed the HPI with the patient and updated documentation appropriately.        Comments   Pt denies changes in vision/floaters. Pt has been noticing a FOL daily, that looks like stop watch in the right eye for the past few weeks. Pt states there is no consistency to when the flash occurs. Pt denies any pain.  Pt recently was found to have some blockage in the chest/valves of his heart and has started seeing a cardiologist and doing physical therapy.      Last edited by Jose Rogue, MD on 11/07/2023  6:34 PM.    Pt states he's doing well, doing cardiac rehab due to bulge on heart and two blockages. Is reading better on his phone.   Referring physician: Shona Norleen PEDLAR, MD [address] [place],  [place] [id number]  HISTORICAL INFORMATION:   Selected notes from the MEDICAL RECORD NUMBER Referred by Dr. Nanetta Sharps Koch:  Ocular Hx- PMH-    CURRENT MEDICATIONS: No current outpatient medications on file. (Ophthalmic Drugs)   No current facility-administered medications for this visit. (Ophthalmic Drugs)   Current Outpatient Medications (Other)  Medication Sig   aspirin  EC 81 MG tablet Take 81 mg by mouth daily. Swallow whole.   atorvastatin  (LIPITOR) 40 MG tablet Take 40 mg by mouth daily.   blood glucose meter kit and supplies KIT Dispense based on patient and insurance preference (something with affordable test strips). Use to check glucose twice daily as directed.   fenofibrate  160 MG tablet Take 160 mg by mouth daily.   ferrous sulfate 325 (65 FE) MG tablet  Take by mouth daily.   fluticasone (FLONASE) 50 MCG/ACT nasal spray Place 2 sprays into both nostrils as needed.   folic acid  (FOLVITE ) 1 MG tablet TAKE 1 TABLET BY MOUTH EVERY DAY   glucose blood (ACCU-CHEK GUIDE) test strip Use as instructed to monitor glucose twice daily, before breakfast and before bed   icosapent  Ethyl (VASCEPA ) 1 g capsule Take 2 capsules (2 g total) by mouth 2 (two) times daily.   Insulin  Glargine (BASAGLAR  KWIKPEN) 100 UNIT/ML Inject 30 Units into the skin at bedtime.   Insulin  Pen Needle 31G X 5 MM MISC 1 Units by Does not apply route daily.   JANUVIA  100 MG tablet Take 100 mg by mouth daily.   JARDIANCE  25 MG TABS tablet Take 25 mg by mouth daily.   metoprolol  succinate (TOPROL  XL) 25 MG 24 hr tablet Take 0.5 tablets (12.5 mg total) by mouth daily.   Misc Natural Products (OSTEO BI-FLEX ADV DOUBLE ST PO) Take 1 tablet by mouth.   nitroGLYCERIN  (NITROSTAT ) 0.4 MG SL tablet Place 1 tablet (0.4 mg total) under the tongue every 5 (five) minutes x 3 doses as needed (if no relief after 2nd dose, proceed to the ED or call 911).   OneTouch Delica Lancets 33G MISC PLEASE SEE ATTACHED FOR DETAILED DIRECTIONS   pantoprazole  (PROTONIX ) 40 MG tablet TAKE 1 TABLET BY  MOUTH 2 TIMES DAILY BEFORE A MEAL 30 MINUTES BEFORE BREAKFAST AND DINNER   REPATHA  SURECLICK 140 MG/ML SOAJ INJECT 1 ML SUBCUTANEOUSLY EVERY 2 WEEKS (Patient not taking: Reported on 10/20/2023)   sodium bicarbonate  650 MG tablet Take 1,300 mg by mouth.   ticagrelor  (BRILINTA ) 90 MG TABS tablet Take 1 tablet (90 mg total) by mouth 2 (two) times daily.   No current facility-administered medications for this visit. (Other)   REVIEW OF SYSTEMS: ROS   Positive for: Cardiovascular, Eyes, Respiratory Negative for: Constitutional, Gastrointestinal, Neurological, Skin, Genitourinary, Musculoskeletal, HENT, Endocrine, Psychiatric, Allergic/Imm, Heme/Lymph Last edited by Jose Koch, COT on 11/07/2023  2:19 PM.         ALLERGIES Allergies  Allergen Reactions   Reglan  [Metoclopramide ] Other (See Comments)    Tremors, parkinson like symptoms    PAST MEDICAL HISTORY Past Medical History:  Diagnosis Date   CHF (congestive heart failure) (HCC)    COVID    COVID 02/2020   very sick - admitted to the hospital   Diabetes mellitus without complication (HCC)    GERD (gastroesophageal reflux disease)    Headache    High cholesterol    History of kidney stones    Hypertension    was diagnosed 2 years ago, never treated and states BP is always - documented 07/13/21   Ichthyosis vulgaris    Pneumonia    Sleep apnea    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   Past Surgical History:  Procedure Laterality Date   CARDIAC CATHETERIZATION     IR ANGIO INTRA EXTRACRAN SEL COM CAROTID INNOMINATE BILAT MOD SED  04/17/2021   IR ANGIO INTRA EXTRACRAN SEL INTERNAL CAROTID UNI R MOD SED  07/17/2021   IR ANGIO VERTEBRAL SEL VERTEBRAL BILAT MOD SED  04/17/2021   IR CT HEAD LTD  07/15/2021   IR INTRA CRAN STENT  07/15/2021   IR RADIOLOGIST EVAL & MGMT  03/05/2021   IR RADIOLOGIST EVAL & MGMT  05/04/2021   IR RADIOLOGIST EVAL & MGMT  07/31/2021   IR US  GUIDE VASC ACCESS RIGHT  04/17/2021   IR US  GUIDE VASC ACCESS RIGHT  07/15/2021   RADIOLOGY WITH ANESTHESIA N/A 07/15/2021   Procedure: STENTING;  Surgeon: Jose Carrion, MD;  Location: [facility] OR;  Service: Radiology;  Laterality: N/A;   TONSILLECTOMY     wisdom teeth removal     FAMILY HISTORY Family History  Problem Relation Age of Onset   Bursitis Mother    Bladder Cancer Mother 33       smoker   SOCIAL HISTORY Social History   Tobacco Use   Smoking status: Never   Smokeless tobacco: Never  Vaping Use   Vaping status: Never Used  Substance Use Topics   Alcohol use: Not Currently   Drug use: Never       OPHTHALMIC EXAM:  Base Eye Exam     Visual Acuity (Snellen - Linear)       Right Left   Dist cc 20/70 -2 20/200   Dist ph cc  20/60 -2 20/150    Correction: Glasses         Tonometry (Tonopen, 2:10 PM)       Right Left   Pressure 11 13         Neuro/Psych     Oriented x3: Yes   Mood/Affect: Normal         Dilation     Both eyes: 1.0% Mydriacyl, 2.5% Phenylephrine  @  2:12 PM           Slit Lamp and Fundus Exam     Slit Lamp Exam       Right Left   Lids/Lashes Dermatochalasis - upper lid, Meibomian gland dysfunction, +Scurf Dermatochalasis - upper lid, Meibomian gland dysfunction, +Scurf   Conjunctiva/Sclera White and quiet White and quiet   Cornea trace PEE, mild tear film debris 1+PEE, mild tear film debris   Anterior Chamber deep and clear deep and clear   Iris Round and dilated, No NVI Round and dilated, No NVI   Lens Clear Clear   Anterior Vitreous Vitreous syneresis mild syneresis         Fundus Exam       Right Left   Disc Pink and Sharp, no heme Pink and sharp, fine NVD -- regressed, 360 exudates   C/D Ratio 0.2 0.2   Macula Blunted foveal reflex, severe edema with severe exudation -- improving, scattered DBH -- slightly improved Blunted foveal reflex, severe edema and exudation, scattered DBH -- improving   Vessels attenuated, Tortuous, +NVE greatest along inferior arcades -- improving, +Sheathing -- improving attenuated, Tortuous, +NV, +Sheathing of arterioles   Periphery Attached, scattered MA/DBH, heavy exudates greatest posteriorly Attached, +exudation posteriorly-improving, scattered MA / DBH           Refraction     Wearing Rx       Sphere Cylinder Axis Add   Right -2.50 +1.00 049 +1.00   Left -0.75 +1.25 178 +1.00            IMAGING AND PROCEDURES  Imaging and Procedures for 11/07/2023  OCT, Retina - OU - Both Eyes       Right Eye Quality was good. Central Foveal Thickness: 364. Progression has improved. Findings include no SRF, abnormal foveal contour, subretinal hyper-reflective material, intraretinal hyper-reflective material, intraretinal  fluid, vitreomacular adhesion (Persistent IRF, IRHM and SRHM greatest IN macula--slightly improved).   Left Eye Quality was good. Central Foveal Thickness: 339. Progression has been stable. Findings include no SRF, abnormal foveal contour, subretinal hyper-reflective material, intraretinal hyper-reflective material, epiretinal membrane, intraretinal fluid, lamellar hole, macular pucker (Persistent edema, IRHM and SRHM, +ERM with pucker).   Notes *Images captured and stored on drive  Diagnosis / Impression:  OD: Persistent IRF, IRHM and SRHM greatest IN macula--slightly improved  OS: Persistent edema, IRHM and SRHM, +ERM with pucker  Clinical management:  See below  Abbreviations: NFP - Normal foveal profile. CME - cystoid macular edema. PED - pigment epithelial detachment. IRF - intraretinal fluid. SRF - subretinal fluid. EZ - ellipsoid zone. ERM - epiretinal membrane. ORA - outer retinal atrophy. ORT - outer retinal tubulation. SRHM - subretinal hyper-reflective material. IRHM - intraretinal hyper-reflective material      Intravitreal Injection, Pharmacologic Agent - OD - Right Eye       Time Out 11/07/2023. 3:05 PM. Confirmed correct patient, procedure, site, and patient consented.   Anesthesia Topical anesthesia was used. Anesthetic medications included Lidocaine  2%, Proparacaine 0.5%.   Procedure Preparation included 5% betadine to ocular surface, eyelid speculum. A supplied (32g) needle was used.   Injection: 6 mg faricimab -svoa 6 MG/0.05ML   Route: Intravitreal, Site: Right Eye   NDC: 49757-903-93, Lot: [id number], Expiration date: 11/21/2024, Waste: 0 mL   Post-op Post injection exam found visual acuity of at least counting fingers. The patient tolerated the procedure well. There were no complications. The patient received written and verbal post procedure care education.  Intravitreal Injection, Pharmacologic Agent - OS - Left Eye       Time Out 11/07/2023. 3:06  PM. Confirmed correct patient, procedure, site, and patient consented.   Anesthesia Topical anesthesia was used. Anesthetic medications included Lidocaine  2%, Proparacaine 0.5%.   Procedure Preparation included 5% betadine to ocular surface, eyelid speculum. A supplied (32g) needle was used.   Injection: 6 mg faricimab -svoa 6 MG/0.05ML   Route: Intravitreal, Site: Left Eye   NDC: 49757-903-93, Lot: [id number], Expiration date: 11/21/2024, Waste: 0 mL   Post-op Post injection exam found visual acuity of at least counting fingers. The patient tolerated the procedure well. There were no complications. The patient received written and verbal post procedure care education.              ASSESSMENT/PLAN:    ICD-10-CM   1. Proliferative diabetic retinopathy of both eyes with macular edema associated with type 2 diabetes mellitus (HCC)  E11.3513 OCT, Retina - OU - Both Eyes    Intravitreal Injection, Pharmacologic Agent - OD - Right Eye    Intravitreal Injection, Pharmacologic Agent - OS - Left Eye    faricimab -svoa (VABYSMO ) 6mg /0.80mL intravitreal injection    faricimab -svoa (VABYSMO ) 6mg /0.57mL intravitreal injection    2. Current use of insulin  (HCC)  Z79.4     3. Long term (current) use of oral hypoglycemic drugs  Z79.84     4. Hyperlipidemia associated with type 2 diabetes mellitus (HCC)  E11.69    E78.5     5. Essential hypertension  I10     6. Hypertensive retinopathy of both eyes  H35.033        1-3. Proliferative diabetic retinopathy, both eyes  - A1c 9-pt reported on 08.18.25, 7.4 on 08.11.23  - s/p IVA OS #1 (02.22.24), #2 (03.22.24), #3 (04.19.24)  -s/p IVA OD #1 (02.23.24), #2 (03.22.24) #3 (04.19.24), #4 (05.17.24), #5 (06.18.24), #6 (11.08.24) -- IVA resistance OU ==================================================  - s/p IVE OD #1 (06.18.24), #2 (07.16.24), #3 (08.13.24), #4 (09.10.24), #5 (10.08.24), #6 (11.08.24), #7 (12.06.24), #8 (01.03.25), #9 (01.31.25),  #10 (02.28.25), #11 (03.28.25) - s/p IVE OS #1 (05.17.24), #2 (06.18.24), #3 (7.16.24), #4 (08.13.24), #5 (09.10.24), #6 (10.08.24), #7 (11.08.24), #8 (12.06.24), #9 (01.03.25), #10 (01.31.25), #11 (02.28.25), #12 (03.28.25) -- IVE resistance OU - IVV OU #1 (04.25.25), #2 (05.23.25), #3 (06.23.25), #4 (07.21.25), #5 (08.18.25)  ===================================================== - exam shows severe edema with scattered MA and DBH, but significant lipid component to edema OU - FA (02.21.24) shows OD: Large cluster of leaking NV along IT arcades, scattered patches of vascular non-perfusion; OS: +NVD and NVE along proximal arcades - BCVA OD 20/60 stable; OS 20/150 from 20/200 - OCT shows OD: Persistent IRF, IRHM and SRHM greatest IN macula--slightly improved; OS: Persistent edema, IRHM and SRHM, +ERM with pucker at 4 wks - recommend IVV OU today #6 (09.15.25) w/ f/u in 4 wks - pt wishes to proceed - RBA of procedure discussed, questions answered - IVV informed consent obtained and signed, 04.25.25 (OU) - see procedure note - Eylea  is approved for 2025, [email or web address] approved - Vabysmo  is approved for 2025, co-pay program approved - f/u 4 weeks -- DFE/OCT, possible injection OU -- possible FA  4. Hyperlipidemia  - review of labs shows severely elevated cholesterol and lipids  - 08.11.23:  Trig  785 Chol  598  LDL  410 - 01.17.25: Trig 335   Chol 279   LDL 173 - likely contributing to severe edema and exudate  5,6. Hypertensive retinopathy OU -  discussed importance of tight BP control - monitor  Ophthalmic Meds Ordered this visit:  Meds ordered this encounter  Medications   faricimab -svoa (VABYSMO ) 6mg /0.93mL intravitreal injection   faricimab -svoa (VABYSMO ) 6mg /0.30mL intravitreal injection     Return in about 4 weeks (around 12/05/2023) for PDR OU, DFE, OCT, Possible Injxn.  There are no Patient Instructions on file for this visit.  This document serves as a record of services  personally performed by Redell JUDITHANN Hans, MD, PhD. It was created on their behalf by Avelina Pereyra, COA an ophthalmic technician. The creation of this record is the provider's dictation and/or activities during the visit.   Electronically signed by: Avelina GORMAN Pereyra, COT  11/14/23  2:33 AM   This document serves as a record of services personally performed by Redell JUDITHANN Hans, MD, PhD. It was created on their behalf by Almetta Pesa, an ophthalmic technician. The creation of this record is the provider's dictation and/or activities during the visit.    Electronically signed by: Almetta Pesa, OA, 11/14/23  2:33 AM   Redell JUDITHANN Hans, M.D., Ph.D. Diseases & Surgery of the Retina and Vitreous [facility]  I have reviewed the above documentation for accuracy and completeness, and I agree with the above. Redell JUDITHANN Hans, M.D., Ph.D. 11/14/23 2:34 AM    Abbreviations: M myopia (nearsighted); A astigmatism; H hyperopia (farsighted); P presbyopia; Mrx spectacle prescription;  CTL contact lenses; OD right eye; OS left eye; OU both eyes  XT exotropia; ET esotropia; PEK punctate epithelial keratitis; PEE punctate epithelial erosions; DES dry eye syndrome; MGD meibomian gland dysfunction; ATs artificial tears; PFAT's preservative free artificial tears; NSC nuclear sclerotic cataract; PSC posterior subcapsular cataract; ERM epi-retinal membrane; PVD posterior vitreous detachment; RD retinal detachment; DM diabetes mellitus; DR diabetic retinopathy; NPDR non-proliferative diabetic retinopathy; PDR proliferative diabetic retinopathy; CSME clinically significant macular edema; DME diabetic macular edema; dbh dot blot hemorrhages; CWS cotton wool spot; POAG primary open angle glaucoma; C/D cup-to-disc ratio; HVF humphrey visual field; GVF goldmann visual field; OCT optical coherence tomography; IOP intraocular pressure; BRVO Branch retinal vein occlusion; CRVO central retinal vein  occlusion; CRAO central retinal artery occlusion; BRAO branch retinal artery occlusion; RT retinal tear; SB scleral buckle; PPV pars plana vitrectomy; VH Vitreous hemorrhage; PRP panretinal laser photocoagulation; IVK intravitreal kenalog; VMT vitreomacular traction; MH Macular hole;  NVD neovascularization of the disc; NVE neovascularization elsewhere; AREDS age related eye disease study; ARMD age related macular degeneration; POAG primary open angle glaucoma; EBMD epithelial/anterior basement membrane dystrophy; ACIOL anterior chamber intraocular lens; IOL intraocular lens; PCIOL posterior chamber intraocular lens; Phaco/IOL phacoemulsification with intraocular lens placement; PRK photorefractive keratectomy; LASIK laser assisted in situ keratomileusis; HTN hypertension; DM diabetes mellitus; COPD chronic obstructive pulmonary disease

## 2023-10-27 ENCOUNTER — Telehealth (HOSPITAL_COMMUNITY): Payer: Self-pay

## 2023-10-27 LAB — GLUCOSE, CAPILLARY
Glucose-Capillary: 213 mg/dL — ABNORMAL HIGH (ref 70–99)
Glucose-Capillary: 161 mg/dL — ABNORMAL HIGH (ref 70–99)

## 2023-10-27 NOTE — Telephone Encounter (Signed)
 Patient's mother-in-law called stating patient will not be in for class tomorrow due to helping with some issues with his wife's health.

## 2023-10-28 ENCOUNTER — Encounter (HOSPITAL_COMMUNITY)
Admission: RE | Admit: 2023-10-28 | Source: Ambulatory Visit | Attending: Internal Medicine | Admitting: Internal Medicine

## 2023-10-28 ENCOUNTER — Encounter (HOSPITAL_COMMUNITY)

## 2023-10-28 ENCOUNTER — Encounter (HOSPITAL_COMMUNITY)
Admission: RE | Admit: 2023-10-28 | Discharge: 2023-10-28 | Disposition: A | Discharge status: Home / Self Care | Source: Ambulatory Visit | Attending: Internal Medicine | Admitting: Internal Medicine

## 2023-10-28 DIAGNOSIS — Z955 Presence of coronary angioplasty implant and graft: Secondary | ICD-10-CM | POA: Diagnosis not present

## 2023-10-28 DIAGNOSIS — I2510 Atherosclerotic heart disease of native coronary artery without angina pectoris: Secondary | ICD-10-CM | POA: Diagnosis not present

## 2023-10-28 DIAGNOSIS — E781 Pure hyperglyceridemia: Secondary | ICD-10-CM | POA: Diagnosis not present

## 2023-10-28 DIAGNOSIS — I5022 Chronic systolic (congestive) heart failure: Secondary | ICD-10-CM | POA: Diagnosis not present

## 2023-10-28 DIAGNOSIS — Z7984 Long term (current) use of oral hypoglycemic drugs: Secondary | ICD-10-CM | POA: Diagnosis not present

## 2023-10-28 DIAGNOSIS — Z794 Long term (current) use of insulin: Secondary | ICD-10-CM | POA: Diagnosis not present

## 2023-10-28 DIAGNOSIS — Z7902 Long term (current) use of antithrombotics/antiplatelets: Secondary | ICD-10-CM | POA: Diagnosis not present

## 2023-10-28 DIAGNOSIS — Z7982 Long term (current) use of aspirin: Secondary | ICD-10-CM | POA: Diagnosis not present

## 2023-10-28 DIAGNOSIS — E1151 Type 2 diabetes mellitus with diabetic peripheral angiopathy without gangrene: Secondary | ICD-10-CM | POA: Diagnosis not present

## 2023-10-31 ENCOUNTER — Encounter (HOSPITAL_COMMUNITY)
Admission: RE | Admit: 2023-10-31 | Discharge: 2023-10-31 | Disposition: A | Discharge status: Home / Self Care | Source: Ambulatory Visit | Attending: Internal Medicine | Admitting: Internal Medicine

## 2023-10-31 DIAGNOSIS — I5022 Chronic systolic (congestive) heart failure: Secondary | ICD-10-CM | POA: Diagnosis not present

## 2023-10-31 DIAGNOSIS — Z955 Presence of coronary angioplasty implant and graft: Secondary | ICD-10-CM | POA: Diagnosis not present

## 2023-10-31 LAB — GLUCOSE, CAPILLARY
Glucose-Capillary: 165 mg/dL — ABNORMAL HIGH (ref 70–99)
Glucose-Capillary: 140 mg/dL — ABNORMAL HIGH (ref 70–99)
Glucose-Capillary: 137 mg/dL — ABNORMAL HIGH (ref 70–99)

## 2023-11-01 NOTE — Progress Notes (Signed)
 Cardiac Individual Treatment Plan  Patient Details  Name: Jose Koch MRN: [id number] Date of Birth: 04/19/1970 Referring Provider:   Flowsheet Row INTENSIVE CARDIAC REHAB ORIENT from 10/18/2023 in [facility] for Heart, Vascular, & Lung Health  Referring Provider Diannah Maywood, MD    Initial Encounter Date:  Flowsheet Row INTENSIVE CARDIAC REHAB ORIENT from 10/18/2023 in [facility] for Heart, Vascular, & Lung Health  Date 10/18/23    Visit Diagnosis: Heart failure, chronic systolic (HCC)  Patient's Home Medications on Admission:  Current Outpatient Medications:    aspirin  EC 81 MG tablet, Take 81 mg by mouth daily. Swallow whole., Disp: , Rfl:    atorvastatin (LIPITOR) 40 MG tablet, Take 40 mg by mouth daily., Disp: , Rfl:    blood glucose meter kit and supplies KIT, Dispense based on patient and insurance preference (something with affordable test strips). Use to check glucose twice daily as directed., Disp: 1 each, Rfl: 0   fenofibrate 160 MG tablet, Take 160 mg by mouth daily., Disp: , Rfl:    ferrous sulfate 325 (65 FE) MG tablet, Take by mouth daily., Disp: , Rfl:    fluticasone (FLONASE) 50 MCG/ACT nasal spray, Place 2 sprays into both nostrils as needed., Disp: , Rfl:    folic acid  (FOLVITE ) 1 MG tablet, TAKE 1 TABLET BY MOUTH EVERY DAY, Disp: 90 tablet, Rfl: 1   glucose blood (ACCU-CHEK GUIDE) test strip, Use as instructed to monitor glucose twice daily, before breakfast and before bed, Disp: 100 each, Rfl: 12   icosapent  Ethyl (VASCEPA ) 1 g capsule, Take 2 capsules (2 g total) by mouth 2 (two) times daily., Disp: 120 capsule, Rfl: 5   Insulin  Glargine (BASAGLAR  KWIKPEN) 100 UNIT/ML, Inject 30 Units into the skin at bedtime., Disp: , Rfl:    Insulin  Pen Needle 31G X 5 MM MISC, 1 Units by Does not apply route daily., Disp: 100 each, Rfl: 1   JANUVIA 100 MG tablet, Take 100 mg by mouth daily., Disp: , Rfl:    JARDIANCE 25 MG  TABS tablet, Take 25 mg by mouth daily., Disp: , Rfl:    metoprolol  succinate (TOPROL  XL) 25 MG 24 hr tablet, Take 0.5 tablets (12.5 mg total) by mouth daily., Disp: 45 tablet, Rfl: 2   Misc Natural Products (OSTEO BI-FLEX ADV DOUBLE ST PO), Take 1 tablet by mouth., Disp: , Rfl:    nitroGLYCERIN  (NITROSTAT ) 0.4 MG SL tablet, Place 1 tablet (0.4 mg total) under the tongue every 5 (five) minutes x 3 doses as needed (if no relief after 2nd dose, proceed to the ED or call 911)., Disp: 25 tablet, Rfl: 3   OneTouch Delica Lancets 33G MISC, PLEASE SEE ATTACHED FOR DETAILED DIRECTIONS, Disp: , Rfl:    pantoprazole  (PROTONIX ) 40 MG tablet, TAKE 1 TABLET BY MOUTH 2 TIMES DAILY BEFORE A MEAL 30 MINUTES BEFORE BREAKFAST AND DINNER, Disp: 60 tablet, Rfl: 3   REPATHA SURECLICK 140 MG/ML SOAJ, INJECT 1 ML SUBCUTANEOUSLY EVERY 2 WEEKS (Patient not taking: Reported on 10/20/2023), Disp: , Rfl:    sodium bicarbonate 650 MG tablet, Take 1,300 mg by mouth., Disp: , Rfl:    ticagrelor  (BRILINTA ) 90 MG TABS tablet, Take 1 tablet (90 mg total) by mouth 2 (two) times daily., Disp: 180 tablet, Rfl: 0  Past Medical History: Past Medical History:  Diagnosis Date   CHF (congestive heart failure) (HCC)    COVID    COVID 02/2020   very sick -  admitted to the hospital   Diabetes mellitus without complication (HCC)    GERD (gastroesophageal reflux disease)    Headache    High cholesterol    History of kidney stones    Hypertension    was diagnosed 2 years ago, never treated and states BP is always - documented 07/13/21   Ichthyosis vulgaris    Pneumonia    Sleep apnea    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    Tobacco Use: Social History   Tobacco Use  Smoking Status Never  Smokeless Tobacco Never    Labs: Review Flowsheet       Latest Ref Rng & Units 04/06/2020 04/14/2020 08/18/2020 06/12/2021  Labs for ITP Cardiac and Pulmonary Rehab  Cholestrol 0 - 200 mg/dL - 507  - -  LDL (calc) 0 - 99 mg/dL -  UNABLE TO CALCULATE IF TRIGLYCERIDE OVER 400 mg/dL  - -  Direct LDL 0 - 99 mg/dL - 893.6  - -  HDL-C >59 mg/dL - 34  - -  Trlycerides <150 mg/dL - 252  - -  Hemoglobin A1c 4.8 - 5.6 % 12.0  - 12.5  8.1      Exercise Target Goals: Exercise Program Goal: Individual exercise prescription set using results from initial 6 min walk test and THRR while considering  patient's activity barriers and safety.   Exercise Prescription Goal: Initial exercise prescription builds to 30-45 minutes a day of aerobic activity, 2-3 days per week.  Home exercise guidelines will be given to patient during program as part of exercise prescription that the participant will acknowledge.   Education: Aerobic Exercise: - Group verbal and visual presentation on the components of exercise prescription. Introduces F.I.T.T principle from ACSM for exercise prescriptions.  Reviews F.I.T.T. principles of aerobic exercise including progression. Written material provided at class time.   Education: Resistance Exercise: - Group verbal and visual presentation on the components of exercise prescription. Introduces F.I.T.T principle from ACSM for exercise prescriptions  Reviews F.I.T.T. principles of resistance exercise including progression. Written material provided at class time.    Education: Exercise & Equipment Safety: - Individual verbal instruction and demonstration of equipment use and safety with use of the equipment.   Education: Exercise Physiology & General Exercise Guidelines: - Group verbal and written instruction with models to review the exercise physiology of the cardiovascular system and associated critical values. Provides general exercise guidelines with specific guidelines to those with heart or lung disease. Written material provided at class time.   Education: Flexibility, Balance, Mind/Body Relaxation: - Group verbal and visual presentation with interactive activity on the components of exercise  prescription. Introduces F.I.T.T principle from ACSM for exercise prescriptions. Reviews F.I.T.T. principles of flexibility and balance exercise training including progression. Also discusses the mind body connection.  Reviews various relaxation techniques to help reduce and manage stress (i.e. Deep breathing, progressive muscle relaxation, and visualization). Balance handout provided to take home. Written material provided at class time.   Activity Barriers & Risk Stratification:  Activity Barriers & Cardiac Risk Stratification - 10/18/23 1249       Activity Barriers & Cardiac Risk Stratification   Activity Barriers Back Problems;Neck/Spine Problems;Deconditioning;Muscular Weakness;Decreased Ventricular Function;Balance Concerns    Cardiac Risk Stratification High          6 Minute Walk:  6 Minute Walk     Row Name 10/18/23 1243         6 Minute Walk   Phase Initial  Distance 1080 feet     Walk Time 6 minutes     # of Rest Breaks 1  5:52-6:00 due to ankle pain     MPH 2     METS 3.6     RPE 11     Perceived Dyspnea  0     VO2 Peak 12.5     Symptoms Yes (comment)     Comments Chronic Left ankle/calf pain 6/10;  chronic rt calf pain 3/10     Resting HR 95 bpm     Resting BP 114/80     Resting Oxygen Saturation  98 %     Exercise Oxygen Saturation  during 6 min walk 99 %     Max Ex. HR 108 bpm     Max Ex. BP 88/67  pt asymptomatic     2 Minute Post BP 115/75        Oxygen Initial Assessment:   Oxygen Re-Evaluation:   Oxygen Discharge (Final Oxygen Re-Evaluation):   Initial Exercise Prescription:  Initial Exercise Prescription - 10/18/23 1200       Date of Initial Exercise RX and Referring Provider   Date 10/18/23    Referring Provider Vishnu Mallipeddi, MD    Expected Discharge Date 01/13/24      Recumbant Bike   Level 1    RPM 60    Watts 36    Minutes 15    METs 3.6      T5 Nustep   Level 1    SPM 75    Minutes 15    METs 3.6       Prescription Details   Frequency (times per week) 3    Duration Progress to 30 minutes of continuous aerobic without signs/symptoms of physical distress      Intensity   THRR 40-80% of Max Heartrate 68-135    Ratings of Perceived Exertion 11-13    Perceived Dyspnea 0-4      Progression   Progression Continue progressive overload as per policy without signs/symptoms or physical distress.      Resistance Training   Training Prescription Yes    Weight 3lbs    Reps 10-15          Perform Capillary Blood Glucose checks as needed.  Exercise Prescription Changes:   Exercise Prescription Changes     Row Name 10/26/23 1600             Response to Exercise   Blood Pressure (Admit) 110/64       Blood Pressure (Exercise) 120/70       Blood Pressure (Exit) 101/71       Heart Rate (Admit) 88 bpm       Heart Rate (Exercise) 106 bpm       Heart Rate (Exit) 97 bpm       Rating of Perceived Exertion (Exercise) 12       Symptoms Left ankle pain       Comments Pt's first day in the [facility] program       Duration Continue with 30 min of aerobic exercise without signs/symptoms of physical distress.       Intensity THRR unchanged         Progression   Progression Continue to progress workloads to maintain intensity without signs/symptoms of physical distress.       Average METs 1.7         Resistance Training   Training Prescription No       Weight  No wts on Wednesday         Interval Training   Interval Training No         Recumbant Bike   Level 1       RPM 48       Watts 10       Minutes 15       METs 1.8         T5 Nustep   Level 1       SPM 56       Minutes 15       METs 1.6          Exercise Comments:   Exercise Comments     Row Name 10/26/23 1628           Exercise Comments Pt's first day in the [facility] program. Pt had chronic left ankle pain on both pieces of equipment. Will continue to monitor.          Exercise Goals and Review:   Exercise Goals      Row Name 10/18/23 1039             Exercise Goals   Increase Physical Activity Yes       Intervention Provide advice, education, support and counseling about physical activity/exercise needs.;Develop an individualized exercise prescription for aerobic and resistive training based on initial evaluation findings, risk stratification, comorbidities and participant's personal goals.       Expected Outcomes Short Term: Attend rehab on a regular basis to increase amount of physical activity.;Long Term: Add in home exercise to make exercise part of routine and to increase amount of physical activity.;Long Term: Exercising regularly at least 3-5 days a week.       Increase Strength and Stamina Yes       Intervention Provide advice, education, support and counseling about physical activity/exercise needs.;Develop an individualized exercise prescription for aerobic and resistive training based on initial evaluation findings, risk stratification, comorbidities and participant's personal goals.       Expected Outcomes Short Term: Increase workloads from initial exercise prescription for resistance, speed, and METs.;Short Term: Perform resistance training exercises routinely during rehab and add in resistance training at home;Long Term: Improve cardiorespiratory fitness, muscular endurance and strength as measured by increased METs and functional capacity ( )       Able to understand and use rate of perceived exertion (RPE) scale Yes       Intervention Provide education and explanation on how to use RPE scale       Expected Outcomes Short Term: Able to use RPE daily in rehab to express subjective intensity level;Long Term:  Able to use RPE to guide intensity level when exercising independently       Knowledge and understanding of Target Heart Rate Range (THRR) Yes       Intervention Provide education and explanation of THRR including how the numbers were predicted and where they are located for reference        Expected Outcomes Short Term: Able to state/look up THRR;Short Term: Able to use daily as guideline for intensity in rehab;Long Term: Able to use THRR to govern intensity when exercising independently       Understanding of Exercise Prescription Yes       Intervention Provide education, explanation, and written materials on patient's individual exercise prescription       Expected Outcomes Short Term: Able to explain program exercise prescription;Long Term: Able to explain home exercise prescription to exercise independently  Exercise Goals Re-Evaluation :  Exercise Goals Re-Evaluation     Row Name 10/26/23 1627             Exercise Goal Re-Evaluation   Exercise Goals Review Increase Physical Activity;Understanding of Exercise Prescription;Increase Strength and Stamina;Knowledge and understanding of Target Heart Rate Range (THRR);Able to understand and use rate of perceived exertion (RPE) scale       Comments Pt's first day in the [facility] program. Pt understandas the exercise Rx, RPE sclae and THRR.       Expected Outcomes Will continue to monitor the patient and progress exercise workloads as tolerated.          Discharge Exercise Prescription (Final Exercise Prescription Changes):  Exercise Prescription Changes - 10/26/23 1600       Response to Exercise   Blood Pressure (Admit) 110/64    Blood Pressure (Exercise) 120/70    Blood Pressure (Exit) 101/71    Heart Rate (Admit) 88 bpm    Heart Rate (Exercise) 106 bpm    Heart Rate (Exit) 97 bpm    Rating of Perceived Exertion (Exercise) 12    Symptoms Left ankle pain    Comments Pt's first day in the [facility] program    Duration Continue with 30 min of aerobic exercise without signs/symptoms of physical distress.    Intensity THRR unchanged      Progression   Progression Continue to progress workloads to maintain intensity without signs/symptoms of physical distress.    Average METs 1.7      Resistance Training    Training Prescription No    Weight No wts on Wednesday      Interval Training   Interval Training No      Recumbant Bike   Level 1    RPM 48    Watts 10    Minutes 15    METs 1.8      T5 Nustep   Level 1    SPM 56    Minutes 15    METs 1.6          Nutrition:  Target Goals: Understanding of nutrition guidelines, daily intake of sodium 1500mg , cholesterol 200mg , calories 30% from fat and 7% or less from saturated fats, daily to have 5 or more servings of fruits and vegetables.  Education: Nutrition 1 -Group instruction provided by verbal, written material, interactive activities, discussions, models, and posters to present general guidelines for heart healthy nutrition including macronutrients, label reading, and promoting whole foods over processed counterparts. Education serves as Pensions consultant of discussion of heart healthy eating for all. Written material provided at class time.    Education: Nutrition 2 -Group instruction provided by verbal, written material, interactive activities, discussions, models, and posters to present general guidelines for heart healthy nutrition including sodium, cholesterol, and saturated fat. Providing guidance of habit forming to improve blood pressure, cholesterol, and body weight. Written material provided at class time.     Biometrics:  Pre Biometrics - 10/18/23 1100       Pre Biometrics   Waist Circumference 37.5 inches    Hip Circumference 39 inches    Waist to Hip Ratio 0.96 %    Triceps Skinfold 14 mm    % Body Fat 23.9 %    Grip Strength 23 kg    Flexibility 13.5 in    Single Leg Stand 8 seconds           Nutrition Therapy Plan and Nutrition Goals:   Nutrition Assessments:  MEDIFICTS Score  Key: >=70 Need to make dietary changes  40-70 Heart Healthy Diet <= 40 Therapeutic Level Cholesterol Diet  Flowsheet Row INTENSIVE CARDIAC REHAB from 10/26/2023 in [facility] for Heart, Vascular, & Lung  Health  Picture Your Plate Total Score on Admission 56   Picture Your Plate Scores: <59 Unhealthy dietary pattern with much room for improvement. 41-50 Dietary pattern unlikely to meet recommendations for good health and room for improvement. 51-60 More healthful dietary pattern, with some room for improvement.  >60 Healthy dietary pattern, although there may be some specific behaviors that could be improved.    Nutrition Goals Re-Evaluation:   Nutrition Goals Discharge (Final Nutrition Goals Re-Evaluation):   Psychosocial: Target Goals: Acknowledge presence or absence of significant depression and/or stress, maximize coping skills, provide positive support system. Participant is able to verbalize types and ability to use techniques and skills needed for reducing stress and depression.   Education: Stress, Anxiety, and Depression - Group verbal and visual presentation to define topics covered.  Reviews how body is impacted by stress, anxiety, and depression.  Also discusses healthy ways to reduce stress and to treat/manage anxiety and depression. Written material provided at class time.   Education: Sleep Hygiene -Provides group verbal and written instruction about how sleep can affect your health.  Define sleep hygiene, discuss sleep cycles and impact of sleep habits. Review good sleep hygiene tips.   Initial Review & Psychosocial Screening:  Initial Psych Review & Screening - 10/18/23 1450       Initial Review   Current issues with Current Stress Concerns    Source of Stress Concerns Chronic Illness;Family    Comments Elmor has mutiple health conditions. Thoma's fiancee is bedridden. Mattew's finacee's Mom takes him to his appointments,      Family Dynamics   Good Support System? Yes   Rudi lives with his fiancee, his Fiancee's mother and father     Barriers   Psychosocial barriers to participate in program The patient should benefit from training in stress management and  relaxation.      Screening Interventions   Interventions Encouraged to exercise;To provide support and resources with identified psychosocial needs;Provide feedback about the scores to participant    Expected Outcomes Long Term Goal: Stressors or current issues are controlled or eliminated.;Short Term goal: Identification and review with participant of any Quality of Life or Depression concerns found by scoring the questionnaire.          Quality of Life Scores:   Quality of Life - 10/18/23 1123       Quality of Life   Select Quality of Life      Quality of Life Scores   Health/Function Pre 23.4 %    Socioeconomic Pre 27.14 %    Psych/Spiritual Pre 26.14 %    Family Pre 26.4 %    GLOBAL Pre 25.18 %         Scores of 19 and below usually indicate a poorer quality of life in these areas.  A difference of  2-3 points is a clinically meaningful difference.  A difference of 2-3 points in the total score of the Quality of Life Index has been associated with significant improvement in overall quality of life, self-image, physical symptoms, and general health in studies assessing change in quality of life.  PHQ-9: Review Flowsheet       10/18/2023 05/26/2020 05/12/2020  Depression screen PHQ 2/9  Decreased Interest 0 0 0  Down, Depressed, Hopeless 0  0 0  PHQ - 2 Score 0 0 0  Altered sleeping 1 - -  Tired, decreased energy 1 - -  Change in appetite 0 - -  Feeling bad or failure about yourself  0 - -  Trouble concentrating 0 - -  Moving slowly or fidgety/restless 0 - -  Suicidal thoughts 0 - -  PHQ-9 Score 2 - -  Difficult doing work/chores Not difficult at all - -   Interpretation of Total Score  Total Score Depression Severity:  1-4 = Minimal depression, 5-9 = Mild depression, 10-14 = Moderate depression, 15-19 = Moderately severe depression, 20-27 = Severe depression   Psychosocial Evaluation and Intervention:   Psychosocial Re-Evaluation:  Psychosocial Re-Evaluation      Row Name 10/28/23 1438             Psychosocial Re-Evaluation   Current issues with Current Stress Concerns       Comments Mattheu has mutiple health conditions Mcadam' fiancee is bedridden; Kadon' finacee's Mom takes him to his appointments), but has not voiced any additional stressors or concerns during exercise thus far at [facility].       Expected Outcomes Pt will have controlled or decreased stressors upon completion of caridac rehab.       Interventions Stress management education;Relaxation education;Encouraged to attend Cardiac Rehabilitation for the exercise       Continue Psychosocial Services  Follow up required by staff          Psychosocial Discharge (Final Psychosocial Re-Evaluation):  Psychosocial Re-Evaluation - 10/28/23 1438       Psychosocial Re-Evaluation   Current issues with Current Stress Concerns    Comments Kalev has mutiple health conditions Eline' fiancee is bedridden; Eugean' finacee's Mom takes him to his appointments), but has not voiced any additional stressors or concerns during exercise thus far at [facility].    Expected Outcomes Pt will have controlled or decreased stressors upon completion of caridac rehab.    Interventions Stress management education;Relaxation education;Encouraged to attend Cardiac Rehabilitation for the exercise    Continue Psychosocial Services  Follow up required by staff          Vocational Rehabilitation: Provide vocational rehab assistance to qualifying candidates.   Vocational Rehab Evaluation & Intervention:  Vocational Rehab - 10/18/23 1452       Initial Vocational Rehab Evaluation & Intervention   Assessment shows need for Vocational Rehabilitation No   Ashaz is unemployed and is not interested in vocational rehab at this time         Education: Education Goals: Education classes will be provided on a variety of topics geared toward better understanding of heart health and risk factor modification. Participant  will state understanding/return demonstration of topics presented as noted by education test scores.  Learning Barriers/Preferences:  Learning Barriers/Preferences - 10/18/23 1305       Learning Barriers/Preferences   Learning Barriers Sight;Hearing    Learning Preferences Skilled Demonstration;Individual Instruction;Pictoral;Group Instruction          General Cardiac Education Topics:  AED/CPR: - Group verbal and written instruction with the use of models to demonstrate the basic use of the AED with the basic ABC's of resuscitation.   Test and Procedures: - Group verbal and visual presentation and models provide information about basic cardiac anatomy and function. Reviews the testing methods done to diagnose heart disease and the outcomes of the test results. Describes the treatment choices: Medical Management, Angioplasty, or Coronary Bypass Surgery for treating various heart conditions  including Myocardial Infarction, Angina, Valve Disease, and Cardiac Arrhythmias. Written material provided at class time.   Medication Safety: - Group verbal and visual instruction to review commonly prescribed medications for heart and lung disease. Reviews the medication, class of the drug, and side effects. Includes the steps to properly store meds and maintain the prescription regimen. Written material provided at class time.   Intimacy: - Group verbal instruction through game format to discuss how heart and lung disease can affect sexual intimacy. Written material provided at class time.   Know Your Numbers and Heart Failure: - Group verbal and visual instruction to discuss disease risk factors for cardiac and pulmonary disease and treatment options.  Reviews associated critical values for Overweight/Obesity, Hypertension, Cholesterol, and Diabetes.  Discusses basics of heart failure: signs/symptoms and treatments.  Introduces Heart Failure Zone chart for action plan for heart failure. Written  material provided at class time.   Infection Prevention: - Provides verbal and written material to individual with discussion of infection control including proper hand washing and proper equipment cleaning during exercise session.   Falls Prevention: - Provides verbal and written material to individual with discussion of falls prevention and safety.   Other: -Provides group and verbal instruction on various topics (see comments)   Knowledge Questionnaire Score:  Knowledge Questionnaire Score - 10/18/23 1306       Knowledge Questionnaire Score   Pre Score 23/24          Core Components/Risk Factors/Patient Goals at Admission:  Personal Goals and Risk Factors at Admission - 10/18/23 1427       Core Components/Risk Factors/Patient Goals on Admission   Diabetes Yes    Intervention Provide education about signs/symptoms and action to take for hypo/hyperglycemia.;Provide education about proper nutrition, including hydration, and aerobic/resistive exercise prescription along with prescribed medications to achieve blood glucose in normal ranges: Fasting glucose 65-99 mg/dL    Expected Outcomes Short Term: Participant verbalizes understanding of the signs/symptoms and immediate care of hyper/hypoglycemia, proper foot care and importance of medication, aerobic/resistive exercise and nutrition plan for blood glucose control.;Long Term: Attainment of HbA1C < 7%.    Heart Failure Yes    Intervention Provide a combined exercise and nutrition program that is supplemented with education, support and counseling about heart failure. Directed toward relieving symptoms such as shortness of breath, decreased exercise tolerance, and extremity edema.    Expected Outcomes Improve functional capacity of life;Short term: Attendance in program 2-3 days a week with increased exercise capacity. Reported lower sodium intake. Reported increased fruit and vegetable intake. Reports medication compliance.;Short  term: Daily weights obtained and reported for increase. Utilizing diuretic protocols set by physician.;Long term: Adoption of self-care skills and reduction of barriers for early signs and symptoms recognition and intervention leading to self-care maintenance.    Hypertension Yes    Intervention Provide education on lifestyle modifcations including regular physical activity/exercise, weight management, moderate sodium restriction and increased consumption of fresh fruit, vegetables, and low fat dairy, alcohol moderation, and smoking cessation.;Monitor prescription use compliance.    Expected Outcomes Short Term: Continued assessment and intervention until BP is < 140/17mm HG in hypertensive participants. < 130/4mm HG in hypertensive participants with diabetes, heart failure or chronic kidney disease.;Long Term: Maintenance of blood pressure at goal levels.    Lipids Yes    Intervention Provide education and support for participant on nutrition & aerobic/resistive exercise along with prescribed medications to achieve LDL 70mg , HDL >40mg .    Expected Outcomes Short Term: Participant states  understanding of desired cholesterol values and is compliant with medications prescribed. Participant is following exercise prescription and nutrition guidelines.;Long Term: Cholesterol controlled with medications as prescribed, with individualized exercise RX and with personalized nutrition plan. Value goals: LDL < 70mg , HDL > 40 mg.    Stress Yes    Intervention Offer individual and/or small group education and counseling on adjustment to heart disease, stress management and health-related lifestyle change. Teach and support self-help strategies.;Refer participants experiencing significant psychosocial distress to appropriate mental health specialists for further evaluation and treatment. When possible, include family members and significant others in education/counseling sessions.    Expected Outcomes Short Term:  Participant demonstrates changes in health-related behavior, relaxation and other stress management skills, ability to obtain effective social support, and compliance with psychotropic medications if prescribed.;Long Term: Emotional wellbeing is indicated by absence of clinically significant psychosocial distress or social isolation.          Education:Diabetes - Individual verbal and written instruction to review signs/symptoms of diabetes, desired ranges of glucose level fasting, after meals and with exercise. Acknowledge that pre and post exercise glucose checks will be done for 3 sessions at entry of program.   Core Components/Risk Factors/Patient Goals Review:   Goals and Risk Factor Review     Row Name 10/31/23 1139             Core Components/Risk Factors/Patient Goals Review   Personal Goals Review Diabetes;Heart Failure;Hypertension;Lipids;Stress       Review Kento is doing well with exercise at cardiac rehab. Vital signs have been stable. Kong has increased his met levels on the recumbent bike and nustep.       Expected Outcomes Etheridge will continue to participate in cardiac rehab for exercise, nutrtion and lifestyle modifications.          Core Components/Risk Factors/Patient Goals at Discharge (Final Review):   Goals and Risk Factor Review - 10/31/23 1139       Core Components/Risk Factors/Patient Goals Review   Personal Goals Review Diabetes;Heart Failure;Hypertension;Lipids;Stress    Review Ashlin is doing well with exercise at cardiac rehab. Vital signs have been stable. Crystian has increased his met levels on the recumbent bike and nustep.    Expected Outcomes Cleston will continue to participate in cardiac rehab for exercise, nutrtion and lifestyle modifications.          ITP Comments:  ITP Comments     Row Name 10/18/23 1430 10/28/23 1435         ITP Comments Wilbert Bihari, MD: Medical director. Intorduction to the [employer]. Initial orientation packet reviewed with the patient. 30 Day ITP Review. Jontez started cardiac rehab on 10/26/23. Effie is off to a good start to exercise.         Comments: see ITP comments

## 2023-11-02 ENCOUNTER — Encounter (HOSPITAL_COMMUNITY)
Admission: RE | Admit: 2023-11-02 | Discharge: 2023-11-02 | Disposition: A | Discharge status: Home / Self Care | Source: Ambulatory Visit | Attending: Internal Medicine | Admitting: Internal Medicine

## 2023-11-02 DIAGNOSIS — I2510 Atherosclerotic heart disease of native coronary artery without angina pectoris: Secondary | ICD-10-CM | POA: Diagnosis not present

## 2023-11-02 DIAGNOSIS — Z794 Long term (current) use of insulin: Secondary | ICD-10-CM | POA: Diagnosis not present

## 2023-11-02 DIAGNOSIS — Z7902 Long term (current) use of antithrombotics/antiplatelets: Secondary | ICD-10-CM | POA: Diagnosis not present

## 2023-11-02 DIAGNOSIS — E781 Pure hyperglyceridemia: Secondary | ICD-10-CM | POA: Diagnosis not present

## 2023-11-02 DIAGNOSIS — Z955 Presence of coronary angioplasty implant and graft: Secondary | ICD-10-CM | POA: Diagnosis not present

## 2023-11-02 DIAGNOSIS — E1151 Type 2 diabetes mellitus with diabetic peripheral angiopathy without gangrene: Secondary | ICD-10-CM | POA: Diagnosis not present

## 2023-11-02 DIAGNOSIS — Z7984 Long term (current) use of oral hypoglycemic drugs: Secondary | ICD-10-CM | POA: Diagnosis not present

## 2023-11-02 DIAGNOSIS — Z7982 Long term (current) use of aspirin: Secondary | ICD-10-CM | POA: Diagnosis not present

## 2023-11-02 DIAGNOSIS — I5022 Chronic systolic (congestive) heart failure: Secondary | ICD-10-CM

## 2023-11-04 ENCOUNTER — Encounter (HOSPITAL_COMMUNITY)
Admission: RE | Admit: 2023-11-04 | Discharge: 2023-11-04 | Disposition: A | Discharge status: Home / Self Care | Source: Ambulatory Visit | Attending: Internal Medicine | Admitting: Internal Medicine

## 2023-11-04 DIAGNOSIS — I5022 Chronic systolic (congestive) heart failure: Secondary | ICD-10-CM | POA: Diagnosis not present

## 2023-11-07 ENCOUNTER — Ambulatory Visit (INDEPENDENT_AMBULATORY_CARE_PROVIDER_SITE_OTHER): Admitting: Ophthalmology

## 2023-11-07 ENCOUNTER — Encounter (INDEPENDENT_AMBULATORY_CARE_PROVIDER_SITE_OTHER): Payer: Self-pay | Admitting: Ophthalmology

## 2023-11-07 ENCOUNTER — Telehealth (HOSPITAL_COMMUNITY): Payer: Self-pay

## 2023-11-07 ENCOUNTER — Encounter (HOSPITAL_COMMUNITY): Admission: RE | Admit: 2023-11-07 | Source: Ambulatory Visit

## 2023-11-07 DIAGNOSIS — E113513 Type 2 diabetes mellitus with proliferative diabetic retinopathy with macular edema, bilateral: Secondary | ICD-10-CM

## 2023-11-07 DIAGNOSIS — I10 Essential (primary) hypertension: Secondary | ICD-10-CM | POA: Diagnosis not present

## 2023-11-07 DIAGNOSIS — Z794 Long term (current) use of insulin: Secondary | ICD-10-CM | POA: Diagnosis not present

## 2023-11-07 DIAGNOSIS — Z7984 Long term (current) use of oral hypoglycemic drugs: Secondary | ICD-10-CM | POA: Diagnosis not present

## 2023-11-07 DIAGNOSIS — E1169 Type 2 diabetes mellitus with other specified complication: Secondary | ICD-10-CM

## 2023-11-07 DIAGNOSIS — H35033 Hypertensive retinopathy, bilateral: Secondary | ICD-10-CM

## 2023-11-07 MED ORDER — FARICIMAB-SVOA 6 MG/0.05ML IZ SOSY
6.0000 mg | PREFILLED_SYRINGE | INTRAVITREAL | Status: AC | PRN
  Administered 2023-11-07: 6 mg via INTRAVITREAL

## 2023-11-07 NOTE — Telephone Encounter (Signed)
 Patient c/o for 12:30 CR class, no reason given.

## 2023-11-09 ENCOUNTER — Encounter (HOSPITAL_COMMUNITY)
Admission: RE | Admit: 2023-11-09 | Discharge: 2023-11-09 | Disposition: A | Discharge status: Home / Self Care | Source: Ambulatory Visit | Attending: Internal Medicine | Admitting: Internal Medicine

## 2023-11-09 DIAGNOSIS — I5022 Chronic systolic (congestive) heart failure: Secondary | ICD-10-CM | POA: Diagnosis not present

## 2023-11-09 DIAGNOSIS — Z794 Long term (current) use of insulin: Secondary | ICD-10-CM | POA: Diagnosis not present

## 2023-11-09 DIAGNOSIS — Z955 Presence of coronary angioplasty implant and graft: Secondary | ICD-10-CM | POA: Diagnosis not present

## 2023-11-09 DIAGNOSIS — E1151 Type 2 diabetes mellitus with diabetic peripheral angiopathy without gangrene: Secondary | ICD-10-CM | POA: Diagnosis not present

## 2023-11-09 DIAGNOSIS — E781 Pure hyperglyceridemia: Secondary | ICD-10-CM | POA: Diagnosis not present

## 2023-11-09 DIAGNOSIS — Z7984 Long term (current) use of oral hypoglycemic drugs: Secondary | ICD-10-CM | POA: Diagnosis not present

## 2023-11-09 DIAGNOSIS — I2510 Atherosclerotic heart disease of native coronary artery without angina pectoris: Secondary | ICD-10-CM | POA: Diagnosis not present

## 2023-11-09 DIAGNOSIS — Z7982 Long term (current) use of aspirin: Secondary | ICD-10-CM | POA: Diagnosis not present

## 2023-11-11 ENCOUNTER — Institutional Professional Consult (permissible substitution) (HOSPITAL_BASED_OUTPATIENT_CLINIC_OR_DEPARTMENT_OTHER): Admitting: Internal Medicine

## 2023-11-11 ENCOUNTER — Encounter (HOSPITAL_COMMUNITY)
Admission: RE | Admit: 2023-11-11 | Discharge: 2023-11-11 | Disposition: A | Discharge status: Home / Self Care | Source: Ambulatory Visit | Attending: Internal Medicine | Admitting: Internal Medicine

## 2023-11-11 DIAGNOSIS — I2510 Atherosclerotic heart disease of native coronary artery without angina pectoris: Secondary | ICD-10-CM | POA: Diagnosis not present

## 2023-11-11 DIAGNOSIS — E781 Pure hyperglyceridemia: Secondary | ICD-10-CM | POA: Diagnosis not present

## 2023-11-11 DIAGNOSIS — Z7902 Long term (current) use of antithrombotics/antiplatelets: Secondary | ICD-10-CM | POA: Diagnosis not present

## 2023-11-11 DIAGNOSIS — Z7984 Long term (current) use of oral hypoglycemic drugs: Secondary | ICD-10-CM | POA: Diagnosis not present

## 2023-11-11 DIAGNOSIS — E1151 Type 2 diabetes mellitus with diabetic peripheral angiopathy without gangrene: Secondary | ICD-10-CM | POA: Diagnosis not present

## 2023-11-11 DIAGNOSIS — I5022 Chronic systolic (congestive) heart failure: Secondary | ICD-10-CM | POA: Diagnosis not present

## 2023-11-11 DIAGNOSIS — Z955 Presence of coronary angioplasty implant and graft: Secondary | ICD-10-CM | POA: Diagnosis not present

## 2023-11-11 DIAGNOSIS — Z794 Long term (current) use of insulin: Secondary | ICD-10-CM | POA: Diagnosis not present

## 2023-11-14 ENCOUNTER — Telehealth: Payer: Self-pay | Admitting: Genetic Counselor

## 2023-11-14 ENCOUNTER — Encounter (HOSPITAL_COMMUNITY)
Admission: RE | Admit: 2023-11-14 | Discharge: 2023-11-14 | Disposition: A | Discharge status: Home / Self Care | Source: Ambulatory Visit | Attending: Internal Medicine

## 2023-11-14 DIAGNOSIS — E1151 Type 2 diabetes mellitus with diabetic peripheral angiopathy without gangrene: Secondary | ICD-10-CM | POA: Diagnosis not present

## 2023-11-14 DIAGNOSIS — Z7984 Long term (current) use of oral hypoglycemic drugs: Secondary | ICD-10-CM | POA: Diagnosis not present

## 2023-11-14 DIAGNOSIS — Z7982 Long term (current) use of aspirin: Secondary | ICD-10-CM | POA: Diagnosis not present

## 2023-11-14 DIAGNOSIS — I5022 Chronic systolic (congestive) heart failure: Secondary | ICD-10-CM | POA: Diagnosis not present

## 2023-11-14 DIAGNOSIS — Z794 Long term (current) use of insulin: Secondary | ICD-10-CM | POA: Diagnosis not present

## 2023-11-14 DIAGNOSIS — Z7902 Long term (current) use of antithrombotics/antiplatelets: Secondary | ICD-10-CM | POA: Diagnosis not present

## 2023-11-14 DIAGNOSIS — E781 Pure hyperglyceridemia: Secondary | ICD-10-CM | POA: Diagnosis not present

## 2023-11-14 DIAGNOSIS — I2510 Atherosclerotic heart disease of native coronary artery without angina pectoris: Secondary | ICD-10-CM | POA: Diagnosis not present

## 2023-11-14 NOTE — Telephone Encounter (Signed)
 Attempted to call Mr. Lafevers. His partner answered and was encouraged to have Mr. Kimoto call me back when he is home to discuss the results of his genetic testing.  Kimberly Molt, MS [facility] Certified Genetic Counselor

## 2023-11-14 NOTE — Telephone Encounter (Signed)
 Spoke with Jose Koch regarding the results of his recent genetic testing.    Jose Koch was seen in the [facility] clinic on 05/05/2023 at 53 yo due to a personal history of ichthyosis and dyslipidemia.  After evaluation, genetic testing was ordered for Jose Koch including exome sequencing.    The [employer] Exome Sequencing was negative/normal.  At this time, we have not identified a genetic cause for Jose Koch's symptoms.  No changes to medical management or testing of other family members are recommended based on these results.  The laboratory will reach out if there are ever any updates regarding Jose Koch's results.  Secondary findings were also normal/negative.   Jose Koch expressed understanding of these results and was encouraged to reach out with any further questions.  The test report has been released to the family and is attached to the associated order. They plan to come to the clinic Jose Koch to pick up a paper copy of these results for their records  Kimberly Molt, MS [facility]  Certified Genetic Counselor

## 2023-11-15 DIAGNOSIS — E1121 Type 2 diabetes mellitus with diabetic nephropathy: Secondary | ICD-10-CM | POA: Diagnosis not present

## 2023-11-16 ENCOUNTER — Encounter (HOSPITAL_COMMUNITY)
Admission: RE | Admit: 2023-11-16 | Discharge: 2023-11-16 | Disposition: A | Discharge status: Home / Self Care | Source: Ambulatory Visit | Attending: Internal Medicine | Admitting: Internal Medicine

## 2023-11-16 DIAGNOSIS — I5022 Chronic systolic (congestive) heart failure: Secondary | ICD-10-CM

## 2023-11-17 ENCOUNTER — Other Ambulatory Visit (HOSPITAL_BASED_OUTPATIENT_CLINIC_OR_DEPARTMENT_OTHER): Payer: Self-pay

## 2023-11-17 ENCOUNTER — Encounter: Payer: Self-pay | Admitting: Internal Medicine

## 2023-11-17 ENCOUNTER — Encounter: Admitting: Internal Medicine

## 2023-11-17 VITALS — BP 134/82 | HR 83 | Ht 68.0 in | Wt 170.0 lb

## 2023-11-17 DIAGNOSIS — I5022 Chronic systolic (congestive) heart failure: Secondary | ICD-10-CM | POA: Diagnosis not present

## 2023-11-17 DIAGNOSIS — E782 Mixed hyperlipidemia: Secondary | ICD-10-CM

## 2023-11-17 DIAGNOSIS — E781 Pure hyperglyceridemia: Secondary | ICD-10-CM

## 2023-11-17 DIAGNOSIS — I739 Peripheral vascular disease, unspecified: Secondary | ICD-10-CM

## 2023-11-17 DIAGNOSIS — I2510 Atherosclerotic heart disease of native coronary artery without angina pectoris: Secondary | ICD-10-CM | POA: Diagnosis not present

## 2023-11-17 MED ORDER — REPATHA SURECLICK 140 MG/ML ~~LOC~~ SOAJ
140.0000 mg | SUBCUTANEOUS | 6 refills | Status: DC
  Filled 2023-11-17 (×3): qty 2, 28d supply, fill #0

## 2023-11-17 NOTE — Patient Instructions (Addendum)
 Medication Instructions:  Your physician has recommended you make the following change in your medication:  Please start your repatha  140 mg subcutaneously every 2 weeks. Continue all other medications as prescribed  Labwork: none  Testing/Procedures: none  Follow-Up: Your physician recommends that you schedule a follow-up appointment in: 3 months  Any Other Special Instructions Will Be Listed Below (If Applicable).  If you need a refill on your cardiac medications before your next appointment, please call your pharmacy.

## 2023-11-17 NOTE — Progress Notes (Signed)
 Cardiology Office Note  Date: 11/17/2023   ID: Jose Koch, DOB September 01, 1970, MRN [id number]  PCP:  Shona Norleen PEDLAR, MD  Cardiologist:  Diannah SHAUNNA Maywood, MD Electrophysiologist:  None   History of Present Illness: Jose Koch is a 53 y.o. male known to have moderate lower EXTR PAD, s/p R ICA stent, hypertriglyceridemia, hyperlipidemia, DM 2 is here for follow-up visit. accompanied by mother-in-law.  Initially referred to cardiology clinic for the management of hyper triglyceridemia.  Initial TG 335 after being on high intensity statin and fenofibrate  but repeat TG was 446 in May 2025.  He was told he had a bulge in his heart for which echocardiogram was performed that showed LVEF 45% with hypokinesis of inferoseptal and inferior walls, normal RV function, no valvular heart disease and a CVP was 3 mm Hg.  Eventually CT cardiac was performed that showed coronary calcium  score of 14.1, 68th percentile for age and sex matched control, occluded mid RCA with collateral filling of the distal RCA/PDA/PLV, occluded high OM1, severe hemodynamic significant stenosis of the proximal LAD.  This was a high risk study and cardiac catheterization was suggested. He is currently in cardiac rehab.  Patient is here for follow-up visit.  Doing well in cardiac rehab.  Continues to deny symptoms of angina, DOE, dizziness, syncope, leg swelling.  Past Medical History:  Diagnosis Date   CHF (congestive heart failure) (HCC)    COVID    COVID 02/2020   very sick - admitted to the hospital   Diabetes mellitus without complication (HCC)    GERD (gastroesophageal reflux disease)    Headache    High cholesterol    History of kidney stones    Hypertension    was diagnosed 2 years ago, never treated and states BP is always - documented 07/13/21   Ichthyosis vulgaris    Pneumonia    Sleep apnea    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    Past Surgical History:  Procedure Laterality Date   CARDIAC  CATHETERIZATION     IR ANGIO INTRA EXTRACRAN SEL COM CAROTID INNOMINATE BILAT MOD SED  04/17/2021   IR ANGIO INTRA EXTRACRAN SEL INTERNAL CAROTID UNI R MOD SED  07/17/2021   IR ANGIO VERTEBRAL SEL VERTEBRAL BILAT MOD SED  04/17/2021   IR CT HEAD LTD  07/15/2021   IR INTRA CRAN STENT  07/15/2021   IR RADIOLOGIST EVAL & MGMT  03/05/2021   IR RADIOLOGIST EVAL & MGMT  05/04/2021   IR RADIOLOGIST EVAL & MGMT  07/31/2021   IR US  GUIDE VASC ACCESS RIGHT  04/17/2021   IR US  GUIDE VASC ACCESS RIGHT  07/15/2021   RADIOLOGY WITH ANESTHESIA N/A 07/15/2021   Procedure: STENTING;  Surgeon: Dolphus Carrion, MD;  Location: [facility] OR;  Service: Radiology;  Laterality: N/A;   TONSILLECTOMY     wisdom teeth removal      Current Outpatient Medications  Medication Sig Dispense Refill   aspirin  EC 81 MG tablet Take 81 mg by mouth daily. Swallow whole.     atorvastatin  (LIPITOR) 40 MG tablet Take 40 mg by mouth daily.     blood glucose meter kit and supplies KIT Dispense based on patient and insurance preference (something with affordable test strips). Use to check glucose twice daily as directed. 1 each 0   fenofibrate  160 MG tablet Take 160 mg by mouth daily.     ferrous sulfate 325 (65 FE) MG tablet Take by mouth daily.  fluticasone (FLONASE) 50 MCG/ACT nasal spray Place 2 sprays into both nostrils as needed.     folic acid  (FOLVITE ) 1 MG tablet TAKE 1 TABLET BY MOUTH EVERY DAY 90 tablet 1   glucose blood (ACCU-CHEK GUIDE) test strip Use as instructed to monitor glucose twice daily, before breakfast and before bed 100 each 12   icosapent  Ethyl (VASCEPA ) 1 g capsule Take 2 capsules (2 g total) by mouth 2 (two) times daily. 120 capsule 5   Insulin  Glargine (BASAGLAR  KWIKPEN) 100 UNIT/ML Inject 30 Units into the skin at bedtime.     Insulin  Pen Needle 31G X 5 MM MISC 1 Units by Does not apply route daily. 100 each 1   JANUVIA  100 MG tablet Take 100 mg by mouth daily.     JARDIANCE  25 MG TABS tablet Take 25  mg by mouth daily.     metoprolol  succinate (TOPROL  XL) 25 MG 24 hr tablet Take 0.5 tablets (12.5 mg total) by mouth daily. 45 tablet 2   Misc Natural Products (OSTEO BI-FLEX ADV DOUBLE ST PO) Take 1 tablet by mouth.     nitroGLYCERIN  (NITROSTAT ) 0.4 MG SL tablet Place 1 tablet (0.4 mg total) under the tongue every 5 (five) minutes x 3 doses as needed (if no relief after 2nd dose, proceed to the ED or call 911). 25 tablet 3   OneTouch Delica Lancets 33G MISC PLEASE SEE ATTACHED FOR DETAILED DIRECTIONS     pantoprazole  (PROTONIX ) 40 MG tablet TAKE 1 TABLET BY MOUTH 2 TIMES DAILY BEFORE A MEAL 30 MINUTES BEFORE BREAKFAST AND DINNER 60 tablet 3   sodium bicarbonate  650 MG tablet Take 1,300 mg by mouth.     ticagrelor  (BRILINTA ) 90 MG TABS tablet Take 1 tablet (90 mg total) by mouth 2 (two) times daily. 180 tablet 0   REPATHA  SURECLICK 140 MG/ML SOAJ INJECT 1 ML SUBCUTANEOUSLY EVERY 2 WEEKS (Patient not taking: Reported on 11/17/2023)     No current facility-administered medications for this visit.   Allergies:  Reglan  [metoclopramide ]   Social History: The patient  reports that he has never smoked. He has never used smokeless tobacco. He reports that he does not currently use alcohol. He reports that he does not use drugs.   Family History: The patient's family history includes Bladder Cancer (age of onset: 58) in his mother; Bursitis in his mother.   ROS:  Please see the history of present illness. Otherwise, complete review of systems is positive for none.  All other systems are reviewed and negative.   Physical Exam: VS:  Ht 5' 8 (1.727 m)   Wt 170 lb (77.1 kg)   BMI 25.85 kg/m , BMI Body mass index is 25.85 kg/m.  Wt Readings from Last 3 Encounters:  11/17/23 170 lb (77.1 kg)  10/20/23 163 lb 9.6 oz (74.2 kg)  10/18/23 163 lb 5.8 oz (74.1 kg)    General: Patient appears comfortable at rest. HEENT: Conjunctiva and lids normal, oropharynx clear with moist mucosa. Neck: Supple, no  elevated JVP or carotid bruits, no thyromegaly. Lungs: Clear to auscultation, nonlabored breathing at rest. Cardiac: Regular rate and rhythm, no S3 or significant systolic murmur, no pericardial rub. Abdomen: Soft, nontender, no hepatomegaly, bowel sounds present, no guarding or rebound. Extremities: No pitting edema, distal pulses 2+. Skin: Warm and dry. Musculoskeletal: No kyphosis. Neuropsychiatric: Alert and oriented x3, affect grossly appropriate.  Recent Labwork: 08/05/2023: ALT 26; AST 18; BUN 29; Creatinine, Ser 1.98; Hemoglobin 11.1; Platelets 187; Potassium 4.6;  Sodium 134     Component Value Date/Time   CHOL 492 (H) 04/14/2020 1551   TRIG 747 (H) 04/14/2020 1551   HDL 34 (L) 04/14/2020 1551   CHOLHDL 14.5 04/14/2020 1551   VLDL UNABLE TO CALCULATE IF TRIGLYCERIDE OVER 400 mg/dL 97/78/7977 8448   LDLCALC UNABLE TO CALCULATE IF TRIGLYCERIDE OVER 400 mg/dL 97/78/7977 8448   LDLDIRECT 106.3 (H) 04/14/2020 1551     Assessment and Plan:  Hypertriglyceridemia Hyperlipidemia - LDL 138 and TG 446 in 06/2023 - Continue atorvastatin  40 mg nightly, fenofibrate  160 mg once daily and continue Vascepa  2 g twice daily. Start Repatha  140 mg Q2 weeks. - Genetic Testing for lipid disorders negative for any genetic etiology. Report scanned. - HbA1c was initially well-controlled but repeat labs showed HbA1c 9 ish and this could likely be secondary prednisone  use recently.  Even when he was off prednisone , his HbA1c remained around 9.  Chronic systolic heart failure, compensated - No DOE, orthopnea, PND or leg swelling. - Echocardiogram showed LVEF 45% with hypokinesis of inferoseptal and inferior walls, normal RV function, no valvular heart disease and CVP 3 mmHg. - Continue metoprolol  succinate 12.5 mg once daily. - Not a candidate for ACE inhibitor/ARB/Arni/SGLT2 inhibitor/MRA due to CKD stage IIIb-IV - Referred to cardiac rehab, doing well.  CAD - No angina. - CT cardiac showed  coronary calcium  score of 14.1, 68th percentile for age and sex matched control, occluded mid RCA with collateral filling of the distal RCA/PDA/PLV, occluded high OM1, severe hemodynamic significant stenosis of the proximal LAD.  This was a high risk study and cardiac catheterization was suggested.  - He does not have any angina or DOE.  He, in fact, reported improvement in his stamina levels after going to the cardiac rehab.  Previously the plan was to hold off on LHC until he develops symptoms or has any ACS event due to CKD stage IIIb.  However with his HbA1c being poorly controlled for a long time, I anticipate he might need LHC sooner rather than later.  I explained to him the risks and benefits of LHC procedure including contrast-induced nephropathy.  He will talk to his nephrologist in the next few weeks about his risk. - I discussed extensively with the patient and his mother-in-law about the symptoms of CAD and MI.  ER precautions for chest pain provided. - Continue cardiac rehab. - Continue cardioprotective medications.  S/p R ICA in 2023 - Currently on DAPT, aspirin  81 mg once daily and Brilinta  90 mg twice daily. - Follows with neurology. - Continue cardioprotective medications as above.  Moderate lower extremity PAD - He has bilateral lower EXTR claudication, L>R - Follows with vascular surgery. - Continue cardioprotective medications as above.   I spent 35 minutes with the patient reviewing the CT cardiac, echocardiogram reports, discussing management of CAD, systolic heart failure and hyperlipidemia as well as discussing his case with IC at [facility].  Complex decision making involved.  10 minutes spent in documenting.  Medication Adjustments/Labs and Tests Ordered: Current medicines are reviewed at length with the patient today.  Concerns regarding medicines are outlined above.    Disposition:  Follow up 3 months  Signed, Kapri Nero Arleta Maywood, MD, 11/17/2023 11:22 AM    [facility] [address], [place], [place] [address]

## 2023-11-18 ENCOUNTER — Encounter (HOSPITAL_COMMUNITY): Admission: RE | Admit: 2023-11-18 | Source: Ambulatory Visit

## 2023-11-18 ENCOUNTER — Other Ambulatory Visit (HOSPITAL_BASED_OUTPATIENT_CLINIC_OR_DEPARTMENT_OTHER): Payer: Self-pay

## 2023-11-18 ENCOUNTER — Telehealth (HOSPITAL_COMMUNITY): Payer: Self-pay

## 2023-11-18 MED ORDER — ATORVASTATIN CALCIUM 40 MG PO TABS
40.0000 mg | ORAL_TABLET | Freq: Every day | ORAL | 2 refills | Status: AC
  Filled 2023-11-18: qty 90, 90d supply, fill #0
  Filled 2024-02-24: qty 90, 90d supply, fill #1

## 2023-11-18 MED ORDER — REPATHA SURECLICK 140 MG/ML ~~LOC~~ SOAJ
140.0000 mg | SUBCUTANEOUS | 1 refills | Status: AC
  Filled 2023-11-18: qty 6, 84d supply, fill #0

## 2023-11-18 MED ORDER — SITAGLIPTIN PHOSPHATE 100 MG PO TABS
100.0000 mg | ORAL_TABLET | Freq: Every day | ORAL | 0 refills | Status: AC
  Filled 2023-11-28 – 2024-02-24 (×8): qty 30, 30d supply, fill #0

## 2023-11-18 MED ORDER — TICAGRELOR 90 MG PO TABS: 90.0000 mg | ORAL_TABLET | Freq: Two times a day (BID) | ORAL | 11 refills | Status: DC

## 2023-11-18 MED ORDER — PANTOPRAZOLE SODIUM 40 MG PO TBEC
40.0000 mg | DELAYED_RELEASE_TABLET | Freq: Every day | ORAL | 2 refills | Status: DC
  Filled 2023-11-28: qty 90, 90d supply, fill #0

## 2023-11-18 MED ORDER — SODIUM BICARBONATE 650 MG PO TABS: 1300.0000 mg | ORAL_TABLET | Freq: Two times a day (BID) | ORAL | 11 refills | Status: AC

## 2023-11-18 MED ORDER — EMPAGLIFLOZIN 25 MG PO TABS
25.0000 mg | ORAL_TABLET | Freq: Every day | ORAL | 5 refills | Status: DC
  Filled 2023-11-18: qty 30, 30d supply, fill #0

## 2023-11-18 MED ORDER — FENOFIBRATE 160 MG PO TABS
160.0000 mg | ORAL_TABLET | Freq: Every day | ORAL | 1 refills | Status: AC
  Filled 2024-02-24: qty 90, 90d supply, fill #0

## 2023-11-18 NOTE — Telephone Encounter (Signed)
 Pt called out of class today due to a migraine. Appointment canceled.

## 2023-11-21 ENCOUNTER — Encounter (HOSPITAL_COMMUNITY)
Admission: RE | Admit: 2023-11-21 | Discharge: 2023-11-21 | Disposition: A | Discharge status: Home / Self Care | Source: Ambulatory Visit | Attending: Internal Medicine | Admitting: Internal Medicine

## 2023-11-21 DIAGNOSIS — I5022 Chronic systolic (congestive) heart failure: Secondary | ICD-10-CM

## 2023-11-22 DIAGNOSIS — I129 Hypertensive chronic kidney disease with stage 1 through stage 4 chronic kidney disease, or unspecified chronic kidney disease: Secondary | ICD-10-CM | POA: Diagnosis not present

## 2023-11-22 DIAGNOSIS — N1832 Chronic kidney disease, stage 3b: Secondary | ICD-10-CM | POA: Diagnosis not present

## 2023-11-22 DIAGNOSIS — E1143 Type 2 diabetes mellitus with diabetic autonomic (poly)neuropathy: Secondary | ICD-10-CM | POA: Diagnosis not present

## 2023-11-22 DIAGNOSIS — E1121 Type 2 diabetes mellitus with diabetic nephropathy: Secondary | ICD-10-CM | POA: Diagnosis not present

## 2023-11-22 DIAGNOSIS — E785 Hyperlipidemia, unspecified: Secondary | ICD-10-CM | POA: Diagnosis not present

## 2023-11-22 DIAGNOSIS — E1122 Type 2 diabetes mellitus with diabetic chronic kidney disease: Secondary | ICD-10-CM | POA: Diagnosis not present

## 2023-11-23 ENCOUNTER — Encounter (HOSPITAL_COMMUNITY)
Admission: RE | Admit: 2023-11-23 | Discharge: 2023-11-23 | Disposition: A | Discharge status: Home / Self Care | Source: Ambulatory Visit | Attending: Internal Medicine | Admitting: Internal Medicine

## 2023-11-23 DIAGNOSIS — I5022 Chronic systolic (congestive) heart failure: Secondary | ICD-10-CM | POA: Diagnosis not present

## 2023-11-23 DIAGNOSIS — R6889 Other general symptoms and signs: Secondary | ICD-10-CM | POA: Diagnosis not present

## 2023-11-23 NOTE — Progress Notes (Signed)
 [facility] - Clinic Note  12/05/2023     CHIEF COMPLAINT Patient presents for Retina Follow Up   HISTORY OF PRESENT ILLNESS: Jose Koch is a 53 y.o. male who presents to the clinic today for:   HPI     Retina Follow Up   Patient presents with  Diabetic Retinopathy.  In both eyes.  This started 4 weeks ago.  Duration of 4 weeks.  Since onset it is stable.  I, the attending physician,  performed the HPI with the patient and updated documentation appropriately.        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 Pt has been noticing a FOL daily, that looks like the bop it game and it flashes in a clockwise motion. Pt states there is no consistency to when the flash occurs. Pt denies any pain.  Pt recently was found to have some blockage in the chest/valves of his heart and has started seeing a cardiologist and doing physical therapy. Pt may be having a catheterization but needs clearance to see if his kidneys can handle the stress of a dye test (appt 12/4).      Last edited by [name], MD on 12/05/2023  5:58 PM.       Referring physician: Shona Norleen PEDLAR, MD [address] [name],  [place] [address]  HISTORICAL INFORMATION:   Selected notes from the MEDICAL RECORD NUMBER Referred by Dr. Nanetta Sharps LEE:  Ocular Hx- PMH-    CURRENT MEDICATIONS: No current outpatient medications on file. (Ophthalmic Drugs)   No current facility-administered medications for this visit. (Ophthalmic Drugs)   Current Outpatient Medications (Other)  Medication Sig   aspirin  EC 81 MG tablet Take 81 mg by mouth daily. Swallow whole.   atorvastatin  (LIPITOR) 40 MG tablet Take 40 mg by mouth daily.   atorvastatin  (LIPITOR) 40 MG tablet Take 1 tablet (40 mg total) by mouth daily.   blood glucose meter kit and supplies KIT Dispense based on patient and insurance preference (something with affordable test strips). Use to check  glucose twice daily as directed.   empagliflozin  (JARDIANCE ) 25 MG TABS tablet Take 1 tablet (25 mg total) by mouth daily.   Evolocumab  (REPATHA  SURECLICK) 140 MG/ML SOAJ Inject 140 mg into the skin every 14 (fourteen) days.   fenofibrate  160 MG tablet Take 160 mg by mouth daily.   fenofibrate  160 MG tablet Take 1 tablet (160 mg total) by mouth daily.   ferrous sulfate 325 (65 FE) MG tablet Take by mouth daily.   fluticasone (FLONASE) 50 MCG/ACT nasal spray Place 2 sprays into both nostrils as needed.   folic acid  (FOLVITE ) 1 MG tablet TAKE 1 TABLET BY MOUTH EVERY DAY   glucose blood (ACCU-CHEK GUIDE) test strip Use as instructed to monitor glucose twice daily, before breakfast and before bed   icosapent  Ethyl (VASCEPA ) 1 g capsule Take 2 capsules (2 g total) by mouth 2 (two) times daily.   Insulin  Glargine (BASAGLAR  KWIKPEN) 100 UNIT/ML Inject 30 Units into the skin at bedtime.   Insulin  Pen Needle 31G X 5 MM MISC 1 Units by Does not apply route daily.   JANUVIA  100 MG tablet Take 100 mg by mouth daily.   JARDIANCE  25 MG TABS tablet Take 25 mg by mouth daily.   metoprolol  succinate (TOPROL  XL) 25 MG 24 hr tablet Take 0.5 tablets (12.5 mg total) by mouth daily.  Misc Natural Products (OSTEO BI-FLEX ADV DOUBLE ST PO) Take 1 tablet by mouth.   nitroGLYCERIN  (NITROSTAT ) 0.4 MG SL tablet Place 1 tablet (0.4 mg total) under the tongue every 5 (five) minutes x 3 doses as needed (if no relief after 2nd dose, proceed to the ED or call 911).   OneTouch Delica Lancets 33G MISC PLEASE SEE ATTACHED FOR DETAILED DIRECTIONS   pantoprazole  (PROTONIX ) 40 MG tablet TAKE 1 TABLET BY MOUTH 2 TIMES DAILY BEFORE A MEAL 30 MINUTES BEFORE BREAKFAST AND DINNER   pantoprazole  (PROTONIX ) 40 MG tablet Take 1 tablet (40 mg total) by mouth daily.   REPATHA  SURECLICK 140 MG/ML SOAJ Inject 140 mg into the skin every 14 (fourteen) days.   sitaGLIPtin  (JANUVIA ) 100 MG tablet Take 1 tablet (100 mg total) by mouth daily.    sodium bicarbonate  650 MG tablet Take 1,300 mg by mouth.   sodium bicarbonate  650 MG tablet Take 2 tablets (1,300 mg total) by mouth 2 (two) times daily, in the morning and evening.   ticagrelor  (BRILINTA ) 90 MG TABS tablet Take 1 tablet (90 mg total) by mouth 2 (two) times daily.   ticagrelor  (BRILINTA ) 90 MG TABS tablet Take 1 tablet (90 mg total) by mouth 2 (two) times daily.   No current facility-administered medications for this visit. (Other)   REVIEW OF SYSTEMS: ROS   Positive for: Cardiovascular, Eyes, Respiratory Negative for: Constitutional, Gastrointestinal, Neurological, Skin, Genitourinary, Musculoskeletal, HENT, Endocrine, Psychiatric, Allergic/Imm, Heme/Lymph Last edited by [name], COT on 12/05/2023  2:55 PM.         ALLERGIES Allergies  Allergen Reactions   Reglan  [Metoclopramide ] Other (See Comments)    Tremors, parkinson like symptoms    PAST MEDICAL HISTORY Past Medical History:  Diagnosis Date   CHF (congestive heart failure) (HCC)    COVID    COVID 02/2020   very sick - admitted to the hospital   Diabetes mellitus without complication (HCC)    GERD (gastroesophageal reflux disease)    Headache    High cholesterol    History of kidney stones    Hypertension    was diagnosed 2 years ago, never treated and states BP is always - documented 07/13/21   Ichthyosis vulgaris    Pneumonia    Sleep apnea    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   Past Surgical History:  Procedure Laterality Date   CARDIAC CATHETERIZATION     IR ANGIO INTRA EXTRACRAN SEL COM CAROTID INNOMINATE BILAT MOD SED  04/17/2021   IR ANGIO INTRA EXTRACRAN SEL INTERNAL CAROTID UNI R MOD SED  07/17/2021   IR ANGIO VERTEBRAL SEL VERTEBRAL BILAT MOD SED  04/17/2021   IR CT HEAD LTD  07/15/2021   IR INTRA CRAN STENT  07/15/2021   IR RADIOLOGIST EVAL & MGMT  03/05/2021   IR RADIOLOGIST EVAL & MGMT  05/04/2021   IR RADIOLOGIST EVAL & MGMT  07/31/2021   IR US  GUIDE VASC  ACCESS RIGHT  04/17/2021   IR US  GUIDE VASC ACCESS RIGHT  07/15/2021   RADIOLOGY WITH ANESTHESIA N/A 07/15/2021   Procedure: STENTING;  Surgeon: Dolphus Carrion, MD;  Location: [facility] OR;  Service: Radiology;  Laterality: N/A;   TONSILLECTOMY     wisdom teeth removal     FAMILY HISTORY Family History  Problem Relation Age of Onset   Bursitis Mother    Bladder Cancer Mother 13       smoker   SOCIAL HISTORY Social History  Tobacco Use   Smoking status: Never   Smokeless tobacco: Never  Vaping Use   Vaping status: Never Used  Substance Use Topics   Alcohol use: Not Currently   Drug use: Never       OPHTHALMIC EXAM:  Base Eye Exam     Visual Acuity (Snellen - Linear)       Right Left   Dist cc 20/50 -2 20/150 -2   Dist ph cc 20/50 -1 NI         Tonometry (Tonopen, 3:09 PM)       Right Left   Pressure 15 16         Pupils       Pupils Dark Light Shape React APD   Right PERRL 3 2 Round Brisk None   Left PERRL 3 2 Round Brisk None         Visual Fields       Left Right    Full Full         Extraocular Movement       Right Left    Full, Ortho Full, Ortho         Neuro/Psych     Oriented x3: Yes   Mood/Affect: Normal         Dilation     Both eyes: 1.0% Mydriacyl, 2.5% Phenylephrine  @ 3:11 PM           Slit Lamp and Fundus Exam     Slit Lamp Exam       Right Left   Lids/Lashes Dermatochalasis - upper lid, Meibomian gland dysfunction, +Scurf Dermatochalasis - upper lid, Meibomian gland dysfunction, +Scurf   Conjunctiva/Sclera White and quiet White and quiet   Cornea trace PEE, mild tear film debris 1+PEE, mild tear film debris   Anterior Chamber deep and clear deep and clear   Iris Round and dilated, No NVI Round and dilated, No NVI   Lens Clear Clear   Anterior Vitreous Vitreous syneresis mild syneresis         Fundus Exam       Right Left   Disc Pink and Sharp, no heme Pink and sharp, fine NVD -- regressed, 360  exudates   C/D Ratio 0.2 0.2   Macula Blunted foveal reflex, severe edema with severe exudation -- improving, scattered DBH -- slightly improved Blunted foveal reflex, severe edema and exudation, scattered DBH -- improving   Vessels attenuated, Tortuous, +NVE greatest along inferior arcades -- regressed, +Sheathing -- improving attenuated, Tortuous, +NV- regressed, +Sheathing of arterioles- improved   Periphery Attached, scattered MA/DBH, heavy exudates greatest posteriorly Attached, +exudation posteriorly-improving, scattered MA / DBH           Refraction     Wearing Rx       Sphere Cylinder Axis Add   Right -2.50 +1.00 049 +1.00   Left -0.75 +1.25 178 +1.00            IMAGING AND PROCEDURES  Imaging and Procedures for 12/05/2023  OCT, Retina - OU - Both Eyes       Right Eye Quality was good. Central Foveal Thickness: 350. Progression has improved. Findings include no SRF, abnormal foveal contour, subretinal hyper-reflective material, intraretinal hyper-reflective material, intraretinal fluid, vitreomacular adhesion (Persistent IRF, IRHM and SRHM greatest IN macula--slightly improved).   Left Eye Quality was good. Central Foveal Thickness: 340. Progression has been stable. Findings include no SRF, abnormal foveal contour, subretinal hyper-reflective material, intraretinal hyper-reflective material, epiretinal membrane, intraretinal fluid, lamellar  hole, macular pucker (Persistent edema, IRHM and SRHM, +ERM with pucker).   Notes *Images captured and stored on drive  Diagnosis / Impression:  OD: Persistent IRF, IRHM and SRHM greatest IN macula--slightly improved  OS: Persistent edema, IRHM and SRHM, +ERM with pucker  Clinical management:  See below  Abbreviations: NFP - Normal foveal profile. CME - cystoid macular edema. PED - pigment epithelial detachment. IRF - intraretinal fluid. SRF - subretinal fluid. EZ - ellipsoid zone. ERM - epiretinal membrane. ORA - outer  retinal atrophy. ORT - outer retinal tubulation. SRHM - subretinal hyper-reflective material. IRHM - intraretinal hyper-reflective material      Intravitreal Injection, Pharmacologic Agent - OD - Right Eye       Time Out 12/05/2023. 3:38 PM. Confirmed correct patient, procedure, site, and patient consented.   Anesthesia Topical anesthesia was used. Anesthetic medications included Lidocaine  2%, Proparacaine 0.5%.   Procedure Preparation included 5% betadine to ocular surface, eyelid speculum. A supplied (32g) needle was used.   Injection: 6 mg faricimab -svoa 6 MG/0.05ML   Route: Intravitreal, Site: Right Eye   NDC: 49757-903-93, Lot: [id number], Expiration date: 11/21/2024, Waste: 0 mL   Post-op Post injection exam found visual acuity of at least counting fingers. The patient tolerated the procedure well. There were no complications. The patient received written and verbal post procedure care education.      Intravitreal Injection, Pharmacologic Agent - OS - Left Eye       Time Out 12/05/2023. 3:38 PM. Confirmed correct patient, procedure, site, and patient consented.   Anesthesia Topical anesthesia was used. Anesthetic medications included Lidocaine  2%, Proparacaine 0.5%.   Procedure Preparation included 5% betadine to ocular surface, eyelid speculum. A supplied (32g) needle was used.   Injection: 6 mg faricimab -svoa 6 MG/0.05ML   Route: Intravitreal, Site: Left Eye   NDC: 49757-903-93, Lot: [id number], Expiration date: 11/21/2024, Waste: 0 mL   Post-op Post injection exam found visual acuity of at least counting fingers. The patient tolerated the procedure well. There were no complications. The patient received written and verbal post procedure care education.      Fluorescein  Angiography Optos (Transit OS)       Right Eye Progression has improved. Early phase findings include staining, window defect, microaneurysm, vascular perfusion defect. Mid/Late phase findings  include leakage, staining, microaneurysm, vascular perfusion defect (Interval improvement in large cluster of leaking NV along IT arcades, scattered patches of vascular non-perfusion).   Left Eye Progression has improved. Early phase findings include delayed filling, staining, window defect, microaneurysm, vascular perfusion defect. Mid/Late phase findings include staining, window defect, microaneurysm, vascular perfusion defect (Interval resolution of +NVD and NVE along proximal arcades).   Notes **Images stored on drive**  Impression: PDR OU OD: Interval improvement in large cluster of leaking NV along IT arcades, scattered patches of vascular non-perfusion OS: Interval resolution of +NVD and NVE along proximal arcades            ASSESSMENT/PLAN:    ICD-10-CM   1. Proliferative diabetic retinopathy of both eyes with macular edema associated with type 2 diabetes mellitus (HCC)  E11.3513 OCT, Retina - OU - Both Eyes    Intravitreal Injection, Pharmacologic Agent - OD - Right Eye    Intravitreal Injection, Pharmacologic Agent - OS - Left Eye    Fluorescein  Angiography Optos (Transit OS)    faricimab -svoa (VABYSMO ) 6mg /0.49mL intravitreal injection    faricimab -svoa (VABYSMO ) 6mg /0.73mL intravitreal injection    2. Current use of insulin  (HCC)  Z79.4  3. Long term (current) use of oral hypoglycemic drugs  Z79.84     4. Hyperlipidemia associated with type 2 diabetes mellitus (HCC)  E11.69    E78.5     5. Essential hypertension  I10     6. Hypertensive retinopathy of both eyes  H35.033      1-3. Proliferative diabetic retinopathy, both eyes  - A1c 9-pt reported on 08.18.25, 7.4 on 08.11.23  - s/p IVA OS #1 (02.22.24), #2 (03.22.24), #3 (04.19.24)  -s/p IVA OD #1 (02.23.24), #2 (03.22.24) #3 (04.19.24), #4 (05.17.24), #5 (06.18.24), #6 (11.08.24) -- IVA resistance OU ================= - s/p IVE OD #1 (06.18.24), #2 (07.16.24), #3 (08.13.24), #4 (09.10.24), #5 (10.08.24),  #6 (11.08.24), #7 (12.06.24), #8 (01.03.25), #9 (01.31.25), #10 (02.28.25), #11 (03.28.25) - s/p IVE OS #1 (05.17.24), #2 (06.18.24), #3 (7.16.24), #4 (08.13.24), #5 (09.10.24), #6 (10.08.24), #7 (11.08.24), #8 (12.06.24), #9 (01.03.25), #10 (01.31.25), #11 (02.28.25), #12 (03.28.25) -- IVE resistance OU ================= - IVV OU #1 (04.25.25), #2 (05.23.25), #3 (06.23.25), #4 (07.21.25), #5 (08.18.25), #6 (09.15.25) ================= - exam shows severe edema with scattered MA and DBH, but significant lipid component to edema OU - FA (02.21.24) shows OD: Large cluster of leaking NV along IT arcades, scattered patches of vascular non-perfusion; OS: +NVD and NVE along proximal arcades - repeat FA (10.13.25) shows OD: Interval improvement in large cluster of leaking NV along IT arcades, scattered patches of vascular non-perfusion; OS: Interval resolution of +NVD and NVE along proximal arcades - BCVA OD 20/50 from 20/60; OS 20/150 - stable - OCT shows OD: Persistent IRF, IRHM and SRHM greatest IN macula--slightly improved; OS: Persistent edema, IRHM and SRHM, +ERM with pucker at 4 wks - recommend IVV OU #7 today (10.13.25) w/ f/u in 4 wks - pt wishes to proceed - RBA of procedure discussed, questions answered - IVV informed consent obtained and signed, 04.25.25 (OU) - see procedure note - Eylea  is approved for 2025, [email or web address] approved - Vabysmo  is approved for 2025, co-pay program approved - f/u 4 weeks -- DFE/OCT, possible injection OU  4. Hyperlipidemia  - review of labs shows severely elevated cholesterol and lipids  - 08.11.23:  Trig  785 Chol  598  LDL  410 - 01.17.25: Trig 335   Chol 279   LDL 173 - likely contributing to severe edema and exudate  5,6. Hypertensive retinopathy OU - discussed importance of tight BP control - monitor  Ophthalmic Meds Ordered this visit:  Meds ordered this encounter  Medications   faricimab -svoa (VABYSMO ) 6mg /0.34mL intravitreal injection    faricimab -svoa (VABYSMO ) 6mg /0.33mL intravitreal injection     Return in about 4 weeks (around 01/02/2024) for f/u, PDR, DFE, OCT, Possible, IVV, OU.  There are no Patient Instructions on file for this visit.  This document serves as a record of services personally performed by Redell JUDITHANN Hans, MD, PhD. It was created on their behalf by Avelina Pereyra, COA an ophthalmic technician. The creation of this record is the provider's dictation and/or activities during the visit.   Electronically signed by: Avelina GORMAN Pereyra, COT  12/05/23  9:39 PM   This document serves as a record of services personally performed by Redell JUDITHANN Hans, MD, PhD. It was created on their behalf by Wanda GEANNIE Keens, COT an ophthalmic technician. The creation of this record is the provider's dictation and/or activities during the visit.    Electronically signed by:  Wanda GEANNIE Keens, COT  12/05/23 9:39 PM  Redell JUDITHANN Hans, M.D., Ph.D. Diseases & Surgery  of the Retina and Vitreous [facility]  I have reviewed the above documentation for accuracy and completeness, and I agree with the above. Redell JUDITHANN Hans, M.D., Ph.D. 12/05/23 9:41 PM    Abbreviations: M myopia (nearsighted); A astigmatism; H hyperopia (farsighted); P presbyopia; Mrx spectacle prescription;  CTL contact lenses; OD right eye; OS left eye; OU both eyes  XT exotropia; ET esotropia; PEK punctate epithelial keratitis; PEE punctate epithelial erosions; DES dry eye syndrome; MGD meibomian gland dysfunction; ATs artificial tears; PFAT's preservative free artificial tears; NSC nuclear sclerotic cataract; PSC posterior subcapsular cataract; ERM epi-retinal membrane; PVD posterior vitreous detachment; RD retinal detachment; DM diabetes mellitus; DR diabetic retinopathy; NPDR non-proliferative diabetic retinopathy; PDR proliferative diabetic retinopathy; CSME clinically significant macular edema; DME diabetic macular edema; dbh dot blot  hemorrhages; CWS cotton wool spot; POAG primary open angle glaucoma; C/D cup-to-disc ratio; HVF humphrey visual field; GVF goldmann visual field; OCT optical coherence tomography; IOP intraocular pressure; BRVO Branch retinal vein occlusion; CRVO central retinal vein occlusion; CRAO central retinal artery occlusion; BRAO branch retinal artery occlusion; RT retinal tear; SB scleral buckle; PPV pars plana vitrectomy; VH Vitreous hemorrhage; PRP panretinal laser photocoagulation; IVK intravitreal kenalog; VMT vitreomacular traction; MH Macular hole;  NVD neovascularization of the disc; NVE neovascularization elsewhere; AREDS age related eye disease study; ARMD age related macular degeneration; POAG primary open angle glaucoma; EBMD epithelial/anterior basement membrane dystrophy; ACIOL anterior chamber intraocular lens; IOL intraocular lens; PCIOL posterior chamber intraocular lens; Phaco/IOL phacoemulsification with intraocular lens placement; PRK photorefractive keratectomy; LASIK laser assisted in situ keratomileusis; HTN hypertension; DM diabetes mellitus; COPD chronic obstructive pulmonary disease

## 2023-11-25 ENCOUNTER — Encounter (HOSPITAL_COMMUNITY)
Admission: RE | Admit: 2023-11-25 | Discharge: 2023-11-25 | Disposition: A | Discharge status: Home / Self Care | Source: Ambulatory Visit | Attending: Internal Medicine | Admitting: Internal Medicine

## 2023-11-25 DIAGNOSIS — I5022 Chronic systolic (congestive) heart failure: Secondary | ICD-10-CM

## 2023-11-28 ENCOUNTER — Other Ambulatory Visit (HOSPITAL_BASED_OUTPATIENT_CLINIC_OR_DEPARTMENT_OTHER): Payer: Self-pay

## 2023-11-28 ENCOUNTER — Encounter (HOSPITAL_COMMUNITY)
Admission: RE | Admit: 2023-11-28 | Discharge: 2023-11-28 | Disposition: A | Source: Ambulatory Visit | Attending: Internal Medicine

## 2023-11-28 DIAGNOSIS — I5022 Chronic systolic (congestive) heart failure: Secondary | ICD-10-CM

## 2023-11-29 DIAGNOSIS — R195 Other fecal abnormalities: Secondary | ICD-10-CM | POA: Insufficient documentation

## 2023-11-29 NOTE — Progress Notes (Signed)
 Cardiac Individual Treatment Plan  Patient Details  Name: Jose Koch MRN: [id number] Date of Birth: 1970-08-23 Referring Provider:   Flowsheet Row INTENSIVE CARDIAC REHAB ORIENT from 10/18/2023 in [facility]  Referring Provider Diannah Maywood, MD    Initial Encounter Date:  Flowsheet Row INTENSIVE CARDIAC REHAB ORIENT from 10/18/2023 in [facility]  Date 10/18/23    Visit Diagnosis: Heart failure, chronic systolic (HCC)  Patient's Home Medications on Admission:  Current Outpatient Medications:    aspirin  EC 81 MG tablet, Take 81 mg by mouth daily. Swallow whole., Disp: , Rfl:    atorvastatin  (LIPITOR) 40 MG tablet, Take 40 mg by mouth daily., Disp: , Rfl:    atorvastatin  (LIPITOR) 40 MG tablet, Take 1 tablet (40 mg total) by mouth daily., Disp: 90 tablet, Rfl: 2   blood glucose meter kit and supplies KIT, Dispense based on patient and insurance preference (something with affordable test strips). Use to check glucose twice daily as directed., Disp: 1 each, Rfl: 0   empagliflozin  (JARDIANCE ) 25 MG TABS tablet, Take 1 tablet (25 mg total) by mouth daily., Disp: 30 tablet, Rfl: 5   Evolocumab  (REPATHA  SURECLICK) 140 MG/ML SOAJ, Inject 140 mg into the skin every 14 (fourteen) days., Disp: 6 mL, Rfl: 1   fenofibrate  160 MG tablet, Take 160 mg by mouth daily., Disp: , Rfl:    fenofibrate  160 MG tablet, Take 1 tablet (160 mg total) by mouth daily., Disp: 90 tablet, Rfl: 1   ferrous sulfate 325 (65 FE) MG tablet, Take by mouth daily., Disp: , Rfl:    fluticasone (FLONASE) 50 MCG/ACT nasal spray, Place 2 sprays into both nostrils as needed., Disp: , Rfl:    folic acid  (FOLVITE ) 1 MG tablet, TAKE 1 TABLET BY MOUTH EVERY DAY, Disp: 90 tablet, Rfl: 1   glucose blood (ACCU-CHEK GUIDE) test strip, Use as instructed to monitor glucose twice daily, before breakfast and before bed,  Disp: 100 each, Rfl: 12   icosapent  Ethyl (VASCEPA ) 1 g capsule, Take 2 capsules (2 g total) by mouth 2 (two) times daily., Disp: 120 capsule, Rfl: 5   Insulin  Glargine (BASAGLAR  KWIKPEN) 100 UNIT/ML, Inject 30 Units into the skin at bedtime., Disp: , Rfl:    Insulin  Pen Needle 31G X 5 MM MISC, 1 Units by Does not apply route daily., Disp: 100 each, Rfl: 1   JANUVIA  100 MG tablet, Take 100 mg by mouth daily., Disp: , Rfl:    JARDIANCE  25 MG TABS tablet, Take 25 mg by mouth daily., Disp: , Rfl:    metoprolol  succinate (TOPROL  XL) 25 MG 24 hr tablet, Take 0.5 tablets (12.5 mg total) by mouth daily., Disp: 45 tablet, Rfl: 2   Misc Natural Products (OSTEO BI-FLEX ADV DOUBLE ST PO), Take 1 tablet by mouth., Disp: , Rfl:    nitroGLYCERIN  (NITROSTAT ) 0.4 MG SL tablet, Place 1 tablet (0.4 mg total) under the tongue every 5 (five) minutes x 3 doses as needed (if no relief after 2nd dose, proceed to the ED or call 911)., Disp: 25 tablet, Rfl: 3   OneTouch Delica Lancets 33G MISC, PLEASE SEE ATTACHED FOR DETAILED DIRECTIONS, Disp: , Rfl:    pantoprazole  (PROTONIX ) 40 MG tablet, TAKE 1 TABLET BY MOUTH 2 TIMES DAILY BEFORE A MEAL 30 MINUTES BEFORE BREAKFAST AND DINNER, Disp: 60 tablet, Rfl: 3   pantoprazole  (PROTONIX ) 40 MG tablet, Take 1 tablet (  40 mg total) by mouth daily., Disp: 90 tablet, Rfl: 2   REPATHA  SURECLICK 140 MG/ML SOAJ, Inject 140 mg into the skin every 14 (fourteen) days., Disp: 2 mL, Rfl: 6   sitaGLIPtin  (JANUVIA ) 100 MG tablet, Take 1 tablet (100 mg total) by mouth daily., Disp: 90 tablet, Rfl: 0   sodium bicarbonate  650 MG tablet, Take 1,300 mg by mouth., Disp: , Rfl:    sodium bicarbonate  650 MG tablet, Take 2 tablets (1,300 mg total) by mouth 2 (two) times daily, in the morning and evening., Disp: 120 tablet, Rfl: 11   ticagrelor  (BRILINTA ) 90 MG TABS tablet, Take 1 tablet (90 mg total) by mouth 2 (two) times daily., Disp: 180 tablet, Rfl: 0   ticagrelor  (BRILINTA ) 90 MG TABS tablet, Take 1  tablet (90 mg total) by mouth 2 (two) times daily., Disp: 60 tablet, Rfl: 11  Past Medical History: Past Medical History:  Diagnosis Date   CHF (congestive heart failure) (HCC)    COVID    COVID 02/2020   very sick - admitted to the hospital   Diabetes mellitus without complication (HCC)    GERD (gastroesophageal reflux disease)    Headache    High cholesterol    History of kidney stones    Hypertension    was diagnosed 2 years ago, never treated and states BP is always - documented 07/13/21   Ichthyosis vulgaris    Pneumonia    Sleep apnea    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    Tobacco Use: Social History   Tobacco Use  Smoking Status Never  Smokeless Tobacco Never    Labs: Review Flowsheet       Latest Ref Rng & Units 04/06/2020 04/14/2020 08/18/2020 06/12/2021  Labs for ITP Cardiac and Pulmonary Rehab  Cholestrol 0 - 200 mg/dL - 507  - -  LDL (calc) 0 - 99 mg/dL - UNABLE TO CALCULATE IF TRIGLYCERIDE OVER 400 mg/dL  - -  Direct LDL 0 - 99 mg/dL - 893.6  - -  HDL-C >59 mg/dL - 34  - -  Trlycerides <150 mg/dL - 252  - -  Hemoglobin A1c 4.8 - 5.6 % 12.0  - 12.5  8.1      Exercise Target Goals: Exercise Program Goal: Individual exercise prescription set using results from initial 6 min walk test and THRR while considering  patient's activity barriers and safety.   Exercise Prescription Goal: Initial exercise prescription builds to 30-45 minutes a day of aerobic activity, 2-3 days per week.  Home exercise guidelines will be given to patient during program as part of exercise prescription that the participant will acknowledge.   Education: Aerobic Exercise: - Group verbal and visual presentation on the components of exercise prescription. Introduces F.I.T.T principle from ACSM for exercise prescriptions.  Reviews F.I.T.T. principles of aerobic exercise including progression. Written material provided at class time.   Education: Resistance Exercise: - Group  verbal and visual presentation on the components of exercise prescription. Introduces F.I.T.T principle from ACSM for exercise prescriptions  Reviews F.I.T.T. principles of resistance exercise including progression. Written material provided at class time.    Education: Exercise & Equipment Safety: - Individual verbal instruction and demonstration of equipment use and safety with use of the equipment.   Education: Exercise Physiology & General Exercise Guidelines: - Group verbal and written instruction with models to review the exercise physiology of the cardiovascular system and associated critical values. Provides general exercise guidelines with specific guidelines  to those with heart or lung disease. Written material provided at class time.   Education: Flexibility, Balance, Mind/Body Relaxation: - Group verbal and visual presentation with interactive activity on the components of exercise prescription. Introduces F.I.T.T principle from ACSM for exercise prescriptions. Reviews F.I.T.T. principles of flexibility and balance exercise training including progression. Also discusses the mind body connection.  Reviews various relaxation techniques to help reduce and manage stress (i.e. Deep breathing, progressive muscle relaxation, and visualization). Balance handout provided to take home. Written material provided at class time.   Activity Barriers & Risk Stratification:  Activity Barriers & Cardiac Risk Stratification - 10/18/23 1249       Activity Barriers & Cardiac Risk Stratification   Activity Barriers Back Problems;Neck/Spine Problems;Deconditioning;Muscular Weakness;Decreased Ventricular Function;Balance Concerns    Cardiac Risk Stratification High          6 Minute Walk:  6 Minute Walk     Row Name 10/18/23 1243         6 Minute Walk   Phase Initial     Distance 1080 feet     Walk Time 6 minutes     # of Rest Breaks 1  5:52-6:00 due to ankle pain     MPH 2     METS 3.6      RPE 11     Perceived Dyspnea  0     VO2 Peak 12.5     Symptoms Yes (comment)     Comments Chronic Left ankle/calf pain 6/10;  chronic rt calf pain 3/10     Resting HR 95 bpm     Resting BP 114/80     Resting Oxygen Saturation  98 %     Exercise Oxygen Saturation  during 6 min walk 99 %     Max Ex. HR 108 bpm     Max Ex. BP 88/67  pt asymptomatic     2 Minute Post BP 115/75        Oxygen Initial Assessment:   Oxygen Re-Evaluation:   Oxygen Discharge (Final Oxygen Re-Evaluation):   Initial Exercise Prescription:  Initial Exercise Prescription - 10/18/23 1200       Date of Initial Exercise RX and Referring Provider   Date 10/18/23    Referring Provider Vishnu Mallipeddi, MD    Expected Discharge Date 01/13/24      Recumbant Bike   Level 1    RPM 60    Watts 36    Minutes 15    METs 3.6      T5 Nustep   Level 1    SPM 75    Minutes 15    METs 3.6      Prescription Details   Frequency (times per week) 3    Duration Progress to 30 minutes of continuous aerobic without signs/symptoms of physical distress      Intensity   THRR 40-80% of Max Heartrate 68-135    Ratings of Perceived Exertion 11-13    Perceived Dyspnea 0-4      Progression   Progression Continue progressive overload as per policy without signs/symptoms or physical distress.      Resistance Training   Training Prescription Yes    Weight 3lbs    Reps 10-15          Perform Capillary Blood Glucose checks as needed.  Exercise Prescription Changes:   Exercise Prescription Changes     Row Name 10/26/23 1600 11/09/23 1600 11/23/23 1600  Response to Exercise   Blood Pressure (Admit) 110/64 104/68 100/70     Blood Pressure (Exercise) 120/70 116/64 --     Blood Pressure (Exit) 101/71 98/62 96/68      Heart Rate (Admit) 88 bpm 86 bpm 92 bpm     Heart Rate (Exercise) 106 bpm 96 bpm 110 bpm     Heart Rate (Exit) 97 bpm 80 bpm 93 bpm     Rating of Perceived Exertion (Exercise) 12  11 12      Symptoms Left ankle pain None None     Comments Pt's first day in the [facility] program Reviewed METs Reviewed METs and goals     Duration Continue with 30 min of aerobic exercise without signs/symptoms of physical distress. Progress to 30 minutes of  aerobic without signs/symptoms of physical distress Continue with 30 min of aerobic exercise without signs/symptoms of physical distress.     Intensity THRR unchanged THRR unchanged THRR unchanged       Progression   Progression Continue to progress workloads to maintain intensity without signs/symptoms of physical distress. Continue to progress workloads to maintain intensity without signs/symptoms of physical distress. Continue to progress workloads to maintain intensity without signs/symptoms of physical distress.     Average METs 1.7 1.85 2.2       Resistance Training   Training Prescription No No No     Weight No wts on Wednesday No wts on Wednesday No wts on Wednesday     Reps -- 10-15 --       Interval Training   Interval Training No No No       Recumbant Bike   Level 1 1 2      RPM 48 56 59     Watts 10 12 20      Minutes 15 15 15      METs 1.8 1.8 2.3       T5 Nustep   Level 1 1 1      SPM 56 69 86     Minutes 15 15 15      METs 1.6 1.9 2.1        Exercise Comments:   Exercise Comments     Row Name 10/26/23 1628 11/09/23 1653 11/23/23 1636       Exercise Comments Pt's first day in the [facility] program. Pt had chronic left ankle pain on both pieces of equipment. Will continue to monitor. Reviewed METs. Pt has progressed from peak METs of 1.8 on entry to a high of 2.7. Reviewed METS and goals. Increased workload on bike today. Pt is inconsistant with his workout due to his ankle. It depends on how it feels as to how hard he will push himself.        Exercise Goals and Review:   Exercise Goals     Row Name 10/18/23 1039             Exercise Goals   Increase Physical Activity Yes       Intervention Provide  advice, education, support and counseling about physical activity/exercise needs.;Develop an individualized exercise prescription for aerobic and resistive training based on initial evaluation findings, risk stratification, comorbidities and participant's personal goals.       Expected Outcomes Short Term: Attend rehab on a regular basis to increase amount of physical activity.;Long Term: Add in home exercise to make exercise part of routine and to increase amount of physical activity.;Long Term: Exercising regularly at least 3-5 days a week.       Increase  Strength and Stamina Yes       Intervention Provide advice, education, support and counseling about physical activity/exercise needs.;Develop an individualized exercise prescription for aerobic and resistive training based on initial evaluation findings, risk stratification, comorbidities and participant's personal goals.       Expected Outcomes Short Term: Increase workloads from initial exercise prescription for resistance, speed, and METs.;Short Term: Perform resistance training exercises routinely during rehab and add in resistance training at home;Long Term: Improve cardiorespiratory fitness, muscular endurance and strength as measured by increased METs and functional capacity ( )       Able to understand and use rate of perceived exertion (RPE) scale Yes       Intervention Provide education and explanation on how to use RPE scale       Expected Outcomes Short Term: Able to use RPE daily in rehab to express subjective intensity level;Long Term:  Able to use RPE to guide intensity level when exercising independently       Knowledge and understanding of Target Heart Rate Range (THRR) Yes       Intervention Provide education and explanation of THRR including how the numbers were predicted and where they are located for reference       Expected Outcomes Short Term: Able to state/look up THRR;Short Term: Able to use daily as guideline for intensity  in rehab;Long Term: Able to use THRR to govern intensity when exercising independently       Understanding of Exercise Prescription Yes       Intervention Provide education, explanation, and written materials on patient's individual exercise prescription       Expected Outcomes Short Term: Able to explain program exercise prescription;Long Term: Able to explain home exercise prescription to exercise independently          Exercise Goals Re-Evaluation :  Exercise Goals Re-Evaluation     Row Name 10/26/23 1627 11/23/23 1634           Exercise Goal Re-Evaluation   Exercise Goals Review Increase Physical Activity;Understanding of Exercise Prescription;Increase Strength and Stamina;Knowledge and understanding of Target Heart Rate Range (THRR);Able to understand and use rate of perceived exertion (RPE) scale Increase Physical Activity;Understanding of Exercise Prescription;Increase Strength and Stamina;Knowledge and understanding of Target Heart Rate Range (THRR);Able to understand and use rate of perceived exertion (RPE) scale      Comments Pt's first day in the CRP2 program. Pt understandas the exercise Rx, RPE sclae and THRR. Reviewed METs and goals with patient today. Pt voices progress on his goal of improved strength and stamina.      Expected Outcomes Will continue to monitor the patient and progress exercise workloads as tolerated. Will continue to monitor the patient and progress exercise workloads as tolerated.         Discharge Exercise Prescription (Final Exercise Prescription Changes):  Exercise Prescription Changes - 11/23/23 1600       Response to Exercise   Blood Pressure (Admit) 100/70    Blood Pressure (Exit) 96/68    Heart Rate (Admit) 92 bpm    Heart Rate (Exercise) 110 bpm    Heart Rate (Exit) 93 bpm    Rating of Perceived Exertion (Exercise) 12    Symptoms None    Comments Reviewed METs and goals    Duration Continue with 30 min of aerobic exercise without  signs/symptoms of physical distress.    Intensity THRR unchanged      Progression   Progression Continue to progress workloads to maintain intensity without  signs/symptoms of physical distress.    Average METs 2.2      Resistance Training   Training Prescription No    Weight No wts on Wednesday      Interval Training   Interval Training No      Recumbant Bike   Level 2    RPM 59    Watts 20    Minutes 15    METs 2.3      T5 Nustep   Level 1    SPM 86    Minutes 15    METs 2.1          Nutrition:  Target Goals: Understanding of nutrition guidelines, daily intake of sodium 1500mg , cholesterol 200mg , calories 30% from fat and 7% or less from saturated fats, daily to have 5 or more servings of fruits and vegetables.  Education: Nutrition 1 -Group instruction provided by verbal, written material, interactive activities, discussions, models, and posters to present general guidelines for heart healthy nutrition including macronutrients, label reading, and promoting whole foods over processed counterparts. Education serves as Pensions consultant of discussion of heart healthy eating for all. Written material provided at class time.    Education: Nutrition 2 -Group instruction provided by verbal, written material, interactive activities, discussions, models, and posters to present general guidelines for heart healthy nutrition including sodium, cholesterol, and saturated fat. Providing guidance of habit forming to improve blood pressure, cholesterol, and body weight. Written material provided at class time.     Biometrics:  Pre Biometrics - 10/18/23 1100       Pre Biometrics   Waist Circumference 37.5 inches    Hip Circumference 39 inches    Waist to Hip Ratio 0.96 %    Triceps Skinfold 14 mm    % Body Fat 23.9 %    Grip Strength 23 kg    Flexibility 13.5 in    Single Leg Stand 8 seconds           Nutrition Therapy Plan and Nutrition Goals:   Nutrition  Assessments:  MEDIFICTS Score Key: >=70 Need to make dietary changes  40-70 Heart Healthy Diet <= 40 Therapeutic Level Cholesterol Diet  Flowsheet Row INTENSIVE CARDIAC REHAB from 10/26/2023 in [facility]  Picture Your Plate Total Score on Admission 56   Picture Your Plate Scores: <59 Unhealthy dietary pattern with much room for improvement. 41-50 Dietary pattern unlikely to meet recommendations for good health and room for improvement. 51-60 More healthful dietary pattern, with some room for improvement.  >60 Healthy dietary pattern, although there may be some specific behaviors that could be improved.    Nutrition Goals Re-Evaluation:   Nutrition Goals Discharge (Final Nutrition Goals Re-Evaluation):   Psychosocial: Target Goals: Acknowledge presence or absence of significant depression and/or stress, maximize coping skills, provide positive support system. Participant is able to verbalize types and ability to use techniques and skills needed for reducing stress and depression.   Education: Stress, Anxiety, and Depression - Group verbal and visual presentation to define topics covered.  Reviews how body is impacted by stress, anxiety, and depression.  Also discusses healthy ways to reduce stress and to treat/manage anxiety and depression. Written material provided at class time.   Education: Sleep Hygiene -Provides group verbal and written instruction about how sleep can affect your health.  Define sleep hygiene, discuss sleep cycles and impact of sleep habits. Review good sleep hygiene tips.   Initial Review & Psychosocial Screening:  Initial Psych Review & Screening - 10/18/23 1450       Initial Review   Current issues with Current Stress Concerns    Source of Stress Concerns Chronic Illness;Family    Comments Homar has mutiple health conditions. Thoma's fiancee is bedridden. Hanford's finacee's Mom takes him to  his appointments,      Family Dynamics   Good Support System? Yes   Arliss lives with his fiancee, his Fiancee's mother and father     Barriers   Psychosocial barriers to participate in program The patient should benefit from training in stress management and relaxation.      Screening Interventions   Interventions Encouraged to exercise;To provide support and resources with identified psychosocial needs;Provide feedback about the scores to participant    Expected Outcomes Long Term Goal: Stressors or current issues are controlled or eliminated.;Short Term goal: Identification and review with participant of any Quality of Life or Depression concerns found by scoring the questionnaire.          Quality of Life Scores:   Quality of Life - 10/18/23 1123       Quality of Life   Select Quality of Life      Quality of Life Scores   Health/Function Pre 23.4 %    Socioeconomic Pre 27.14 %    Psych/Spiritual Pre 26.14 %    Family Pre 26.4 %    GLOBAL Pre 25.18 %         Scores of 19 and below usually indicate a poorer quality of life in these areas.  A difference of  2-3 points is a clinically meaningful difference.  A difference of 2-3 points in the total score of the Quality of Life Index has been associated with significant improvement in overall quality of life, self-image, physical symptoms, and general health in studies assessing change in quality of life.  PHQ-9: Review Flowsheet       10/18/2023 05/26/2020 05/12/2020  Depression screen PHQ 2/9  Decreased Interest 0 0 0  Down, Depressed, Hopeless 0 0 0  PHQ - 2 Score 0 0 0  Altered sleeping 1 - -  Tired, decreased energy 1 - -  Change in appetite 0 - -  Feeling bad or failure about yourself  0 - -  Trouble concentrating 0 - -  Moving slowly or fidgety/restless 0 - -  Suicidal thoughts 0 - -  PHQ-9 Score 2 - -  Difficult doing work/chores Not difficult at all - -   Interpretation of Total Score  Total Score Depression  Severity:  1-4 = Minimal depression, 5-9 = Mild depression, 10-14 = Moderate depression, 15-19 = Moderately severe depression, 20-27 = Severe depression   Psychosocial Evaluation and Intervention:   Psychosocial Re-Evaluation:  Psychosocial Re-Evaluation     Row Name 10/28/23 1438 11/22/23 1434           Psychosocial Re-Evaluation   Current issues with Current Stress Concerns Current Stress Concerns      Comments Carley has mutiple health conditions Liskey' fiancee is bedridden; Jeptha' finacee's Mom takes him to his appointments), but has not voiced any additional stressors or concerns during exercise thus far at [facility]. Delando has not voiced any additional concerns during exercise at cardiac rehab since his last 30 day ITP.      Expected Outcomes Pt will have controlled or decreased stressors upon completion of caridac rehab. Pt will have controlled or decreased stressors upon completion of caridac rehab.  Interventions Stress management education;Relaxation education;Encouraged to attend Cardiac Rehabilitation for the exercise Stress management education;Relaxation education;Encouraged to attend Cardiac Rehabilitation for the exercise      Continue Psychosocial Services  Follow up required by staff Follow up required by staff         Psychosocial Discharge (Final Psychosocial Re-Evaluation):  Psychosocial Re-Evaluation - 11/22/23 1434       Psychosocial Re-Evaluation   Current issues with Current Stress Concerns    Comments Bellamy has not voiced any additional concerns during exercise at cardiac rehab since his last 30 day ITP.    Expected Outcomes Pt will have controlled or decreased stressors upon completion of caridac rehab.    Interventions Stress management education;Relaxation education;Encouraged to attend Cardiac Rehabilitation for the exercise    Continue Psychosocial Services  Follow up required by staff          Vocational Rehabilitation: Provide vocational rehab  assistance to qualifying candidates.   Vocational Rehab Evaluation & Intervention:  Vocational Rehab - 10/18/23 1452       Initial Vocational Rehab Evaluation & Intervention   Assessment shows need for Vocational Rehabilitation No   Dima is unemployed and is not interested in vocational rehab at this time         Education: Education Goals: Education classes will be provided on a variety of topics geared toward better understanding of heart health and risk factor modification. Participant will state understanding/return demonstration of topics presented as noted by education test scores.  Learning Barriers/Preferences:  Learning Barriers/Preferences - 10/18/23 1305       Learning Barriers/Preferences   Learning Barriers Sight;Hearing    Learning Preferences Skilled Demonstration;Individual Instruction;Pictoral;Group Instruction          General Cardiac Education Topics:  AED/CPR: - Group verbal and written instruction with the use of models to demonstrate the basic use of the AED with the basic ABC's of resuscitation.   Test and Procedures: - Group verbal and visual presentation and models provide information about basic cardiac anatomy and function. Reviews the testing methods done to diagnose heart disease and the outcomes of the test results. Describes the treatment choices: Medical Management, Angioplasty, or Coronary Bypass Surgery for treating various heart conditions including Myocardial Infarction, Angina, Valve Disease, and Cardiac Arrhythmias. Written material provided at class time.   Medication Safety: - Group verbal and visual instruction to review commonly prescribed medications for heart and lung disease. Reviews the medication, class of the drug, and side effects. Includes the steps to properly store meds and maintain the prescription regimen. Written material provided at class time.   Intimacy: - Group verbal instruction through game format to discuss how  heart and lung disease can affect sexual intimacy. Written material provided at class time.   Know Your Numbers and Heart Failure: - Group verbal and visual instruction to discuss disease risk factors for cardiac and pulmonary disease and treatment options.  Reviews associated critical values for Overweight/Obesity, Hypertension, Cholesterol, and Diabetes.  Discusses basics of heart failure: signs/symptoms and treatments.  Introduces Heart Failure Zone chart for action plan for heart failure. Written material provided at class time.   Infection Prevention: - Provides verbal and written material to individual with discussion of infection control including proper hand washing and proper equipment cleaning during exercise session.   Falls Prevention: - Provides verbal and written material to individual with discussion of falls prevention and safety.   Other: -Provides group and verbal instruction on various topics (see comments)   Knowledge  Questionnaire Score:  Knowledge Questionnaire Score - 10/18/23 1306       Knowledge Questionnaire Score   Pre Score 23/24          Core Components/Risk Factors/Patient Goals at Admission:  Personal Goals and Risk Factors at Admission - 10/18/23 1427       Core Components/Risk Factors/Patient Goals on Admission   Diabetes Yes    Intervention Provide education about signs/symptoms and action to take for hypo/hyperglycemia.;Provide education about proper nutrition, including hydration, and aerobic/resistive exercise prescription along with prescribed medications to achieve blood glucose in normal ranges: Fasting glucose 65-99 mg/dL    Expected Outcomes Short Term: Participant verbalizes understanding of the signs/symptoms and immediate care of hyper/hypoglycemia, proper foot care and importance of medication, aerobic/resistive exercise and nutrition plan for blood glucose control.;Long Term: Attainment of HbA1C < 7%.    Heart Failure Yes     Intervention Provide a combined exercise and nutrition program that is supplemented with education, support and counseling about heart failure. Directed toward relieving symptoms such as shortness of breath, decreased exercise tolerance, and extremity edema.    Expected Outcomes Improve functional capacity of life;Short term: Attendance in program 2-3 days a week with increased exercise capacity. Reported lower sodium intake. Reported increased fruit and vegetable intake. Reports medication compliance.;Short term: Daily weights obtained and reported for increase. Utilizing diuretic protocols set by physician.;Long term: Adoption of self-care skills and reduction of barriers for early signs and symptoms recognition and intervention leading to self-care maintenance.    Hypertension Yes    Intervention Provide education on lifestyle modifcations including regular physical activity/exercise, weight management, moderate sodium restriction and increased consumption of fresh fruit, vegetables, and low fat dairy, alcohol moderation, and smoking cessation.;Monitor prescription use compliance.    Expected Outcomes Short Term: Continued assessment and intervention until BP is < 140/11mm HG in hypertensive participants. < 130/32mm HG in hypertensive participants with diabetes, heart failure or chronic kidney disease.;Long Term: Maintenance of blood pressure at goal levels.    Lipids Yes    Intervention Provide education and support for participant on nutrition & aerobic/resistive exercise along with prescribed medications to achieve LDL 70mg , HDL >40mg .    Expected Outcomes Short Term: Participant states understanding of desired cholesterol values and is compliant with medications prescribed. Participant is following exercise prescription and nutrition guidelines.;Long Term: Cholesterol controlled with medications as prescribed, with individualized exercise RX and with personalized nutrition plan. Value goals: LDL <  70mg , HDL > 40 mg.    Stress Yes    Intervention Offer individual and/or small group education and counseling on adjustment to heart disease, stress management and health-related lifestyle change. Teach and support self-help strategies.;Refer participants experiencing significant psychosocial distress to appropriate mental health specialists for further evaluation and treatment. When possible, include family members and significant others in education/counseling sessions.    Expected Outcomes Short Term: Participant demonstrates changes in health-related behavior, relaxation and other stress management skills, ability to obtain effective social support, and compliance with psychotropic medications if prescribed.;Long Term: Emotional wellbeing is indicated by absence of clinically significant psychosocial distress or social isolation.          Education:Diabetes - Individual verbal and written instruction to review signs/symptoms of diabetes, desired ranges of glucose level fasting, after meals and with exercise. Acknowledge that pre and post exercise glucose checks will be done for 3 sessions at entry of program.   Core Components/Risk Factors/Patient Goals Review:   Goals and Risk Factor Review  Row Name 10/31/23 1139 11/22/23 1436           Core Components/Risk Factors/Patient Goals Review   Personal Goals Review Diabetes;Heart Failure;Hypertension;Lipids;Stress Diabetes;Heart Failure;Hypertension;Lipids;Stress      Review Mitsuo is doing well with exercise at cardiac rehab. Vital signs have been stable. Rondle has increased his met levels on the recumbent bike and nustep. Dyshon is doing well with exercise at cardiac rehab. Vital signs have been stable. Tavin has increased his met levels on the recumbent bike and nustep and has lost 3.9kg since 10/26/23 start.      Expected Outcomes Kanton will continue to participate in cardiac rehab for exercise, nutrtion and lifestyle modifications.  Alvan will continue to participate in cardiac rehab for exercise, nutrtion and lifestyle modifications.         Core Components/Risk Factors/Patient Goals at Discharge (Final Review):   Goals and Risk Factor Review - 11/22/23 1436       Core Components/Risk Factors/Patient Goals Review   Personal Goals Review Diabetes;Heart Failure;Hypertension;Lipids;Stress    Review Arlow is doing well with exercise at cardiac rehab. Vital signs have been stable. Jaire has increased his met levels on the recumbent bike and nustep and has lost 3.9kg since 10/26/23 start.    Expected Outcomes Kavon will continue to participate in cardiac rehab for exercise, nutrtion and lifestyle modifications.          ITP Comments:  ITP Comments     Row Name 10/18/23 1430 10/28/23 1435 11/22/23 1433       ITP Comments Wilbert Bihari, MD: Medical director. Intorduction to the [facility] [employer] Initial orientation packet reviewed with the patient. 30 Day ITP Review. Gor started cardiac rehab on 10/26/23. Beauregard is off to a good start to exercise. 30 Day ITP Review. Eytan is doing well with exercise at cardiac rehab.        Comments: see ITP comments

## 2023-11-30 ENCOUNTER — Encounter (HOSPITAL_COMMUNITY)
Admission: RE | Admit: 2023-11-30 | Discharge: 2023-11-30 | Disposition: A | Discharge status: Home / Self Care | Source: Ambulatory Visit | Attending: Internal Medicine | Admitting: Internal Medicine

## 2023-11-30 DIAGNOSIS — I5022 Chronic systolic (congestive) heart failure: Secondary | ICD-10-CM

## 2023-12-02 ENCOUNTER — Encounter (HOSPITAL_COMMUNITY)
Admission: RE | Admit: 2023-12-02 | Discharge: 2023-12-02 | Disposition: A | Discharge status: Home / Self Care | Source: Ambulatory Visit | Attending: Internal Medicine | Admitting: Internal Medicine

## 2023-12-02 DIAGNOSIS — I5022 Chronic systolic (congestive) heart failure: Secondary | ICD-10-CM

## 2023-12-05 ENCOUNTER — Encounter (INDEPENDENT_AMBULATORY_CARE_PROVIDER_SITE_OTHER): Payer: Self-pay | Admitting: Ophthalmology

## 2023-12-05 ENCOUNTER — Ambulatory Visit (INDEPENDENT_AMBULATORY_CARE_PROVIDER_SITE_OTHER): Admitting: Ophthalmology

## 2023-12-05 ENCOUNTER — Encounter (HOSPITAL_COMMUNITY)
Admission: RE | Admit: 2023-12-05 | Discharge: 2023-12-05 | Disposition: A | Discharge status: Home / Self Care | Source: Ambulatory Visit | Attending: Internal Medicine | Admitting: Internal Medicine

## 2023-12-05 ENCOUNTER — Encounter: Payer: Self-pay | Admitting: Gastroenterology

## 2023-12-05 VITALS — BP 111/76 | HR 93

## 2023-12-05 DIAGNOSIS — E113513 Type 2 diabetes mellitus with proliferative diabetic retinopathy with macular edema, bilateral: Secondary | ICD-10-CM

## 2023-12-05 DIAGNOSIS — Z7984 Long term (current) use of oral hypoglycemic drugs: Secondary | ICD-10-CM

## 2023-12-05 DIAGNOSIS — Z794 Long term (current) use of insulin: Secondary | ICD-10-CM

## 2023-12-05 DIAGNOSIS — I5022 Chronic systolic (congestive) heart failure: Secondary | ICD-10-CM | POA: Diagnosis not present

## 2023-12-05 DIAGNOSIS — E1169 Type 2 diabetes mellitus with other specified complication: Secondary | ICD-10-CM

## 2023-12-05 DIAGNOSIS — H35033 Hypertensive retinopathy, bilateral: Secondary | ICD-10-CM

## 2023-12-05 DIAGNOSIS — I10 Essential (primary) hypertension: Secondary | ICD-10-CM | POA: Diagnosis not present

## 2023-12-05 MED ORDER — FARICIMAB-SVOA 6 MG/0.05ML IZ SOSY
6.0000 mg | PREFILLED_SYRINGE | INTRAVITREAL | Status: AC | PRN
  Administered 2023-12-05: 6 mg via INTRAVITREAL

## 2023-12-07 ENCOUNTER — Encounter (HOSPITAL_COMMUNITY)
Admission: RE | Admit: 2023-12-07 | Discharge: 2023-12-07 | Disposition: A | Discharge status: Home / Self Care | Source: Ambulatory Visit | Attending: Internal Medicine | Admitting: Internal Medicine

## 2023-12-07 DIAGNOSIS — I5022 Chronic systolic (congestive) heart failure: Secondary | ICD-10-CM

## 2023-12-09 ENCOUNTER — Encounter (HOSPITAL_COMMUNITY)
Admission: RE | Admit: 2023-12-09 | Discharge: 2023-12-09 | Disposition: A | Discharge status: Home / Self Care | Source: Ambulatory Visit | Attending: Internal Medicine | Admitting: Internal Medicine

## 2023-12-09 DIAGNOSIS — I5022 Chronic systolic (congestive) heart failure: Secondary | ICD-10-CM | POA: Diagnosis not present

## 2023-12-12 ENCOUNTER — Encounter (HOSPITAL_COMMUNITY)
Admission: RE | Admit: 2023-12-12 | Discharge: 2023-12-12 | Disposition: A | Source: Ambulatory Visit | Attending: Internal Medicine

## 2023-12-12 DIAGNOSIS — I5022 Chronic systolic (congestive) heart failure: Secondary | ICD-10-CM

## 2023-12-14 ENCOUNTER — Other Ambulatory Visit: Payer: Self-pay | Admitting: *Deleted

## 2023-12-14 ENCOUNTER — Telehealth: Payer: Self-pay | Admitting: *Deleted

## 2023-12-14 ENCOUNTER — Encounter: Payer: Self-pay | Admitting: *Deleted

## 2023-12-14 ENCOUNTER — Encounter (HOSPITAL_COMMUNITY): Admission: RE | Admit: 2023-12-14 | Source: Ambulatory Visit

## 2023-12-14 ENCOUNTER — Telehealth (HOSPITAL_COMMUNITY): Payer: Self-pay

## 2023-12-14 ENCOUNTER — Ambulatory Visit (INDEPENDENT_AMBULATORY_CARE_PROVIDER_SITE_OTHER): Admitting: Gastroenterology

## 2023-12-14 ENCOUNTER — Other Ambulatory Visit (HOSPITAL_BASED_OUTPATIENT_CLINIC_OR_DEPARTMENT_OTHER): Payer: Self-pay

## 2023-12-14 ENCOUNTER — Encounter: Payer: Self-pay | Admitting: Gastroenterology

## 2023-12-14 VITALS — BP 110/75 | HR 91 | Temp 97.9°F | Ht 68.0 in | Wt 172.9 lb

## 2023-12-14 DIAGNOSIS — R195 Other fecal abnormalities: Secondary | ICD-10-CM | POA: Diagnosis not present

## 2023-12-14 DIAGNOSIS — R161 Splenomegaly, not elsewhere classified: Secondary | ICD-10-CM | POA: Diagnosis not present

## 2023-12-14 DIAGNOSIS — K760 Fatty (change of) liver, not elsewhere classified: Secondary | ICD-10-CM | POA: Diagnosis not present

## 2023-12-14 DIAGNOSIS — D509 Iron deficiency anemia, unspecified: Secondary | ICD-10-CM | POA: Diagnosis not present

## 2023-12-14 MED ORDER — PEG 3350-KCL-NA BICARB-NACL 420 G PO SOLR: 4000.0000 mL | Freq: Once | ORAL | 0 refills | Status: AC

## 2023-12-14 NOTE — Telephone Encounter (Signed)
 PA approved via carelon for COLONOSCOPY Order ID: [id number]       Authorized Approval Valid Through: 12/14/2023 - 03/12/2024  PA approved for EGD via carelon Order ID: [id number]       Authorized Approval Valid Through: 12/14/2023 - 03/12/2024

## 2023-12-14 NOTE — Telephone Encounter (Signed)
 Patient c/o for 12:30 CR class, no reason given.

## 2023-12-14 NOTE — Telephone Encounter (Signed)
    12/14/23  Jose Koch 08-09-1970  What type of surgery is being performed? Colonoscopy/egd  When is surgery scheduled? 12/21/2023  What type of clearance is required (medical or pharmacy to hold medication or both? Medical only. We will not be holding the Brilinta   Name of physician performing surgery?  Dr. Shaaron Rouse [facility] at [facility] Phone: [phone], option 5 Fax: [phone]  Anesthesia type (none, local, MAC, general)? MAC
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
## 2023-12-14 NOTE — Patient Instructions (Signed)
 We are arranging a colonoscopy and upper endoscopy with Dr. Shaaron in the near future due to heme positive stool and history of iron deficiency.  Due to the history of fatty liver, I ordered an updated ultrasound and a lab to test for stiffness of the liver.  Please see instructions for medications to adjust prior to the procedure.   We will see you in 3 months regardless!  I enjoyed seeing you again today! I value our relationship and want to provide genuine, compassionate, and quality care. You may receive a survey regarding your visit with me, and I welcome your feedback! Thanks so much for taking the time to complete this. I look forward to seeing you again.      Therisa MICAEL Stager, PhD, ANP-BC [facility]

## 2023-12-14 NOTE — Progress Notes (Signed)
 Gastroenterology Office Note    Referring Provider: [name], FNP Primary Care Physician:  [name], FNP  Primary GI: Dr. Shaaron    Chief Complaint   Chief Complaint  Patient presents with   Blood In Stools    Pt arrives due to testing positive for blood in stool via stool cards. Has not seen any blood. Nothing out of the ordinary per pt. Does not have much information on family history.      History of Present Illness   Jose Koch is a 53 y.o. male presenting today at the request of [name], FNP due to heme positive stool. He has multiple medical issues that did not start until after Covid in 2022. He was last seen in 2022 in hospital follow-up s/p admission for N/V felt due to gastroparesis flare. He was lost to follow-up at that time. 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 His mother in law is present with him today.   Recently seen by Cardiology on 11/17/23. History of chronic systolic heart failure but compensated, ECHO with EF 45%, continues with cardiac rehab and doing well with this. With known CAD, he had cardiac CT that showed occluded mid RCA with collateral filling of the distal RCA/PDA/PLV, occluded high OM1, severe hemodynamic significant stenosis of the proximal LAD. This was a high risk study and cardiac catheterization was suggested. He is asymptomatic and has been doing well in cardiac rehab. Discussion of cardiac cath at last visit with cardiology but has been on hold due to CKD stage IIIb. Plans were discussion with Nephrology in interim  with follow-up in 3months.    Denies overt GI bleeding. No abdominal pain. No significant constipation. Pantoprazole  once daily for GERD. No dysphagia. No issues with N/V. BM usually once to twice a day. Appetite waxes and wanes. Eating 3 times a day. No unexplained weight loss. Gaining weight. No jaundice or pruritus.   Hep B surface antigen negative, Hep C negative in the past. LFTs normal. No recent thrombocytopenia documented.   Splenomegaly with thrombocytopenia intermittently and known hepatic steatosis. 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   No ETOH  or drug use.  Unknown family history.  Mother: passed from bladder cancer  No prior colonoscopy/EGD.   Past Medical History:  Diagnosis Date   CHF (congestive heart failure) (HCC)    COVID    COVID 02/2020   very sick - admitted to the hospital   Diabetes mellitus without complication (HCC)    Diabetic retinopathy (HCC)    Diabetic retinopathy (HCC)    GERD (gastroesophageal reflux disease)    Headache    High cholesterol    History of kidney stones    Hypertension    was diagnosed 2 years ago, never treated and states BP is always - documented 07/13/21   Ichthyosis vulgaris    Macular edema    Pneumonia    Sleep apnea    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    Past Surgical History:  Procedure Laterality Date   CARDIAC CATHETERIZATION     IR ANGIO INTRA EXTRACRAN SEL COM  CAROTID INNOMINATE BILAT MOD SED  04/17/2021   IR ANGIO INTRA EXTRACRAN SEL INTERNAL CAROTID UNI R MOD SED  07/17/2021   IR ANGIO VERTEBRAL SEL VERTEBRAL BILAT MOD SED  04/17/2021   IR CT HEAD LTD  07/15/2021   IR INTRA CRAN STENT  07/15/2021   IR RADIOLOGIST EVAL & MGMT  03/05/2021   IR RADIOLOGIST EVAL & MGMT  05/04/2021   IR RADIOLOGIST EVAL & MGMT  07/31/2021   IR US  GUIDE VASC ACCESS RIGHT  04/17/2021   IR US  GUIDE VASC ACCESS RIGHT  07/15/2021   RADIOLOGY WITH ANESTHESIA N/A  07/15/2021   Procedure: STENTING;  Surgeon: Dolphus Carrion, MD;  Location: [facility] OR;  Service: Radiology;  Laterality: N/A;   TONSILLECTOMY     wisdom teeth removal      Current Outpatient Medications  Medication Sig Dispense Refill   aspirin  EC 81 MG tablet Take 81 mg by mouth daily. Swallow whole.     atorvastatin  (LIPITOR) 40 MG tablet Take 1 tablet (40 mg total) by mouth daily. 90 tablet 2   blood glucose meter kit and supplies KIT Dispense based on patient and insurance preference (something with affordable test strips). Use to check glucose twice daily as directed. 1 each 0   empagliflozin  (JARDIANCE ) 25 MG TABS tablet Take 1 tablet (25 mg total) by mouth daily. 30 tablet 5   Evolocumab  (REPATHA  SURECLICK) 140 MG/ML SOAJ Inject 140 mg into the skin every 14 (fourteen) days. 6 mL 1   fenofibrate  160 MG tablet Take 1 tablet (160 mg total) by mouth daily. 90 tablet 1   ferrous sulfate 325 (65 FE) MG tablet Take by mouth daily.     folic acid  (FOLVITE ) 1 MG tablet TAKE 1 TABLET BY MOUTH EVERY DAY 90 tablet 1   glucose blood (ACCU-CHEK GUIDE) test strip Use as instructed to monitor glucose twice daily, before breakfast and before bed 100 each 12   icosapent  Ethyl (VASCEPA ) 1 g capsule Take 2 capsules (2 g total) by mouth 2 (two) times daily. 120 capsule 5   Insulin  Glargine (BASAGLAR  KWIKPEN) 100 UNIT/ML Inject 30 Units into the skin at bedtime.     Insulin  Pen Needle 31G X 5 MM MISC 1 Units by Does not apply route daily. 100 each 1   JANUVIA  100 MG tablet Take 100 mg by mouth daily.     JARDIANCE  25 MG TABS tablet Take 25 mg by mouth daily.     metoprolol  succinate (TOPROL  XL) 25 MG 24 hr tablet Take 0.5 tablets (12.5 mg total) by mouth daily. 45 tablet 2   Misc Natural Products (OSTEO BI-FLEX ADV DOUBLE ST PO) Take 1 tablet by mouth.     nitroGLYCERIN  (NITROSTAT ) 0.4 MG SL tablet Place 1 tablet (0.4 mg total) under the tongue every 5 (five) minutes x 3 doses as needed (if no relief after  2nd dose, proceed to the ED or call 911). 25 tablet 3   OneTouch Delica Lancets 33G MISC PLEASE SEE ATTACHED FOR DETAILED DIRECTIONS     pantoprazole  (PROTONIX ) 40 MG tablet TAKE 1 TABLET BY MOUTH 2 TIMES DAILY BEFORE A MEAL 30 MINUTES BEFORE BREAKFAST AND DINNER 60 tablet 3   sitaGLIPtin  (JANUVIA ) 100 MG tablet Take 1 tablet (100 mg total) by mouth daily. 90 tablet 0   ticagrelor  (BRILINTA ) 90 MG TABS tablet Take 1 tablet (90 mg total) by mouth 2 (two) times daily. 60 tablet 11   sodium bicarbonate  650 MG tablet Take 2 tablets (  1,300 mg total) by mouth 2 (two) times daily, in the morning and evening. (Patient not taking: Reported on 12/14/2023) 120 tablet 11   No current facility-administered medications for this visit.    Allergies as of 12/14/2023 - Review Complete 12/14/2023  Allergen Reaction Noted   Reglan  [metoclopramide ] Other (See Comments) 06/16/2023    Family History  Problem Relation Age of Onset   Bursitis Mother    Bladder Cancer Mother 82       smoker    Social History   Socioeconomic History   Marital status: Significant Other    Spouse name: Not on file   Number of children: 1   Years of education: 48   Highest education level: GED or equivalent  Occupational History   Occupation: unemployed  Tobacco Use   Smoking status: Never   Smokeless tobacco: Never  Vaping Use   Vaping status: Never Used  Substance and Sexual Activity   Alcohol use: Not Currently   Drug use: Never   Sexual activity: Not on file  Other Topics Concern   Not on file  Social History Narrative   Right handed    Social Drivers of Health   Financial Resource Strain: Low Risk  (05/26/2020)   Overall Financial Resource Strain (CARDIA)    Difficulty of Paying Living Expenses: Not hard at all  Food Insecurity: No Food Insecurity (05/26/2020)   Hunger Vital Sign    Worried About Running Out of Food in the Last Year: Never true    Ran Out of Food in the Last Year: Never true   Transportation Needs: Unmet Transportation Needs (05/26/2020)   PRAPARE - Transportation    Lack of Transportation (Medical): Yes    Lack of Transportation (Non-Medical): Yes  Physical Activity: Sufficiently Active (05/26/2020)   Exercise Vital Sign    Days of Exercise per Week: 7 days    Minutes of Exercise per Session: 30 min  Stress: Stress Concern Present (05/26/2020)   [facility] [employer] - Occupational Stress Questionnaire    Feeling of Stress : Very much  Social Connections: Socially Isolated (05/26/2020)   Social Connection and Isolation Panel    Frequency of Communication with Friends and Family: More than three times a week    Frequency of Social Gatherings with Friends and Family: More than three times a week    Attends Religious Services: Never    Database administrator or Organizations: No    Attends Banker Meetings: Never    Marital Status: Divorced  Catering manager Violence: Not At Risk (05/26/2020)   Humiliation, Afraid, Rape, and Kick questionnaire    Fear of Current or Ex-Partner: No    Emotionally Abused: No    Physically Abused: No    Sexually Abused: No     Review of Systems   Gen: Denies any fever, chills, fatigue, weight loss, lack of appetite.  CV: Denies chest pain, heart palpitations, peripheral edema, syncope.  Resp: Denies shortness of breath at rest or with exertion. Denies wheezing or cough.  GI: Denies dysphagia or odynophagia. Denies jaundice, hematemesis, fecal incontinence. GU : Denies urinary burning, urinary frequency, urinary hesitancy MS: Denies joint pain, muscle weakness, cramps, or limitation of movement.  Derm: Denies rash, itching, dry skin Psych: Denies depression, anxiety, memory loss, and confusion Heme: Denies bruising, bleeding, and enlarged lymph nodes.   Physical Exam   BP 110/75   Pulse 91   Temp 97.9 F (36.6 C)   Ht 5'  8 (1.727 m)   Wt 172 lb 14.4 oz (78.4 kg)   BMI 26.29 kg/m   General:   Alert and oriented. Pleasant and cooperative. Well-nourished and well-developed. Scattered dry skin Head:  Normocephalic and atraumatic. Eyes:  Without icterus Ears:  Normal auditory acuity. Lungs:  Clear to auscultation bilaterally.  Heart:  S1, S2 present without murmurs appreciated.  Abdomen:  +BS, soft, non-tender and non-distended. No HSM noted. No guarding or rebound. No masses appreciated.  Rectal:  Deferred  Msk:  Symmetrical without gross deformities. Normal posture. Extremities:  Without edema. Neurologic:  Alert and  oriented x4;  grossly normal neurologically. Skin:  Intact without significant lesions or rashes. Psych:  Alert and cooperative. Normal mood and affect.   Assessment   Vamsi Apfel is a 53 y.o. male presenting today at request of Rosaline Macadam, FNP, due to heme positive stool. He has an extensive past medical history that unfolded following Covid in 2022 . 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   He has no overt signs of GI bleeding, but he does have evidence for IDA and takes oral iron. In setting of DAPT (Brilinta  and aspirin ), could have ongoing occult blood loss anywhere in GI tract. Most recent Hgb 12.2 and remaining stable. Recommending colonoscopy/EGD in near future.  History of splenomegaly: will update US  abdomen complete and check ELF score. Hep B surface antigen negative, Hep C negative in the past. LFTs normal. No recent thrombocytopenia documented. 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 Continue to follow with oncology.   Cardiac history includes known CAD with cardiac CT showing high risk study as above and cardiac cath has been held off due to CKD and need for discussion with Nephrologist. Currently, he is being medically managed and continues with cardiac rehab, doing quite well and asymptomatic from that standpoint. Because of this, we will have cardiac clearance prior to colonoscopy/EGD.   He is also on Brilinta  with hx of right internal cardiac artery stenosis s/p stent in 2023. Will keep on Brilinta  due to his extensive prior history and discuss this with Dr. Shaaron as well. Any larger polyps would need to be addressed at a later time if unable to be removed at time of procedure.       PLAN   Obtain cardiac clearance Will likely remain on Brilinta  for procedures: discussing with Dr. Shaaron as well Proceed with colonoscopy/EGD due to IDA and heme positive stool by Dr. Shaaron in near future: the risks, benefits, and alternatives have been discussed with the patient in detail. The patient states understanding and desires to proceed. US  abdomen complete ELF Return in follow up thereafter   Therisa MICAEL Stager, PhD, ANP-BC [facility]

## 2023-12-14 NOTE — Telephone Encounter (Signed)
 Dr. Mallipeddi  You saw this patient on 11/17/2023. Per protocol we request that you comment on his cardiac risk to proceed with colonoscopy and EDG on 12/21/2023, since it has been less than 2 months since evaluated in the office. Please send your comment to P CV Pre-Op Pool.  Thank you, Lamarr Satterfield DNP, ANP, AACC.

## 2023-12-15 ENCOUNTER — Other Ambulatory Visit: Payer: Self-pay

## 2023-12-15 ENCOUNTER — Encounter (HOSPITAL_COMMUNITY): Payer: Self-pay

## 2023-12-15 ENCOUNTER — Encounter (HOSPITAL_COMMUNITY)
Admission: RE | Admit: 2023-12-15 | Discharge: 2023-12-15 | Disposition: A | Discharge status: Home / Self Care | Source: Ambulatory Visit | Attending: Internal Medicine | Admitting: Internal Medicine

## 2023-12-16 ENCOUNTER — Encounter: Payer: Self-pay | Admitting: Neurology

## 2023-12-16 ENCOUNTER — Encounter (HOSPITAL_COMMUNITY): Admission: RE | Admit: 2023-12-16 | Source: Ambulatory Visit

## 2023-12-16 ENCOUNTER — Ambulatory Visit: Admitting: Neurology

## 2023-12-16 VITALS — BP 125/77 | HR 98 | Ht 68.0 in | Wt 169.0 lb

## 2023-12-16 DIAGNOSIS — I6521 Occlusion and stenosis of right carotid artery: Secondary | ICD-10-CM

## 2023-12-16 DIAGNOSIS — G40209 Localization-related (focal) (partial) symptomatic epilepsy and epileptic syndromes with complex partial seizures, not intractable, without status epilepticus: Secondary | ICD-10-CM

## 2023-12-16 DIAGNOSIS — I671 Cerebral aneurysm, nonruptured: Secondary | ICD-10-CM

## 2023-12-16 DIAGNOSIS — G2119 Other drug induced secondary parkinsonism: Secondary | ICD-10-CM

## 2023-12-16 NOTE — Telephone Encounter (Signed)
   Patient Name: Jose Koch  DOB: 1970-04-26 MRN: [id number]  Primary Cardiologist: Vishnu P Mallipeddi, MD  Chart reviewed as part of pre-operative protocol coverage. Given past medical history and time since last visit, based on ACC/AHA guidelines, Jose Koch is at acceptable risk for the planned procedure without further cardiovascular testing.   High risk due to non-revascularized severe multivessel CAD. Avoid hypotension and cardiotoxic agents. We cannot proceed with cath either due to hem positive stool and severe IDA.   The patient was advised that if he develops new symptoms prior to surgery to contact our office to arrange for a follow-up visit, and he verbalized understanding.  I will route this recommendation to the requesting party via Epic fax function and remove from pre-op pool.  Please call with questions.  Lamarr Satterfield, NP 12/16/2023, 8:06 AM

## 2023-12-16 NOTE — Patient Instructions (Signed)
 Continue Keppra  Will refer you to Dr. Nancyann Burns regarding the stent and aneurysms Follow up with me in 7 months.

## 2023-12-19 ENCOUNTER — Encounter (HOSPITAL_COMMUNITY): Admission: RE | Admit: 2023-12-19

## 2023-12-19 ENCOUNTER — Other Ambulatory Visit: Payer: Self-pay

## 2023-12-19 ENCOUNTER — Ambulatory Visit: Admitting: Neurology

## 2023-12-19 ENCOUNTER — Other Ambulatory Visit (HOSPITAL_BASED_OUTPATIENT_CLINIC_OR_DEPARTMENT_OTHER): Payer: Self-pay

## 2023-12-19 ENCOUNTER — Telehealth (HOSPITAL_COMMUNITY): Payer: Self-pay

## 2023-12-19 LAB — ENHANCED LIVER FIBROSIS (ELF): ENHANCED LIVER FIBROSIS (ELF) SCORE: 9.47 (ref <9.80)

## 2023-12-19 NOTE — Telephone Encounter (Signed)
 Patient c/o for 12:30 CR class, states he will not be in at all this week. No reason given.

## 2023-12-19 NOTE — Telephone Encounter (Signed)
 Mindy:  As cardiac says high risk, he is not a candidate for anesthesia here at [facility]   He will need to have procedures either in [place] or tertiary facility. We need to cancel his procedures. Let me know if I Need to discuss with him!

## 2023-12-19 NOTE — Telephone Encounter (Addendum)
 LMOVM to call back Message also sent to endo to cancel

## 2023-12-19 NOTE — Progress Notes (Addendum)
 [facility] - Clinic Note  01/02/2024     CHIEF COMPLAINT Patient presents for Retina Follow Up   HISTORY OF PRESENT ILLNESS: Jose Koch is a 53 y.o. male who presents to the clinic today for:   HPI     Retina Follow Up   Patient presents with  Diabetic Retinopathy.  In both eyes.  This started 9 months ago.  Duration of 4 weeks.  Since onset it is stable.  I, the attending physician,  performed the HPI with the patient and updated documentation appropriately.        Comments   Pt denies any changes in vision/no FOL/floaters/pain.       Last edited by [name], MD on 01/02/2024  5:19 PM.     Pt states he's doing ok, girlfriend is in the hospital due to kidney issues. 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 [name],  [place] [address]  HISTORICAL INFORMATION:   Selected notes from the MEDICAL RECORD NUMBER Referred by Dr. Nanetta Sharps LEE:  Ocular Hx- PMH-    CURRENT MEDICATIONS: No current outpatient medications on file. (Ophthalmic Drugs)   No current facility-administered medications for this visit. (Ophthalmic Drugs)   Current Outpatient Medications (Other)  Medication Sig   aspirin  EC 81 MG tablet Take 81 mg by mouth daily. Swallow whole.   atorvastatin  (LIPITOR) 40 MG tablet Take 1 tablet (40 mg total) by mouth daily.   blood glucose meter kit and supplies KIT Dispense based on patient and insurance preference (something with affordable test strips). Use to check glucose twice daily as directed.   empagliflozin  (JARDIANCE ) 25 MG TABS tablet Take 1 tablet (25 mg total) by mouth daily.   Evolocumab  (REPATHA  SURECLICK) 140 MG/ML SOAJ Inject 140 mg into the skin every 14 (fourteen) days.   fenofibrate  160 MG tablet Take 1 tablet (160 mg total) by mouth daily.   ferrous sulfate 325 (65 FE) MG tablet Take by mouth  daily.   folic acid  (FOLVITE ) 1 MG tablet TAKE 1 TABLET BY MOUTH EVERY DAY   glucose blood (ACCU-CHEK GUIDE) test strip Use as instructed to monitor glucose twice daily, before breakfast and before bed   icosapent  Ethyl (VASCEPA ) 1 g capsule Take 2 capsules (2 g total) by mouth 2 (two) times daily.   Insulin  Glargine (BASAGLAR  KWIKPEN) 100 UNIT/ML Inject 30 Units into the skin at bedtime.   Insulin  Pen Needle 31G X 5 MM MISC 1 Units by Does not apply route daily.   JARDIANCE  25 MG TABS tablet Take 25 mg by mouth daily.   metoprolol  succinate (TOPROL  XL) 25 MG 24 hr tablet Take 0.5 tablets (12.5 mg total) by mouth daily.   Misc Natural Products (OSTEO BI-FLEX ADV DOUBLE ST PO) Take 1 tablet by mouth.   nitroGLYCERIN  (NITROSTAT ) 0.4 MG SL tablet Place 1 tablet (0.4 mg total) under the tongue every 5 (five) minutes x 3 doses as needed (if no relief after 2nd dose, proceed to the ED or call 911).   OneTouch Delica Lancets 33G MISC PLEASE SEE ATTACHED FOR DETAILED DIRECTIONS   pantoprazole  (PROTONIX ) 40 MG tablet TAKE 1 TABLET BY MOUTH 2 TIMES DAILY BEFORE A MEAL 30 MINUTES BEFORE BREAKFAST AND DINNER   sitaGLIPtin  (JANUVIA ) 100 MG tablet Take 1 tablet (100 mg total) by mouth daily.   sodium  bicarbonate 650 MG tablet Take 2 tablets (1,300 mg total) by mouth 2 (two) times daily, in the morning and evening. (Patient not taking: Reported on 12/16/2023)   ticagrelor  (BRILINTA ) 90 MG TABS tablet Take 1 tablet (90 mg total) by mouth 2 (two) times daily.   No current facility-administered medications for this visit. (Other)   REVIEW OF SYSTEMS: ROS   Positive for: Cardiovascular, Eyes, Respiratory Negative for: Constitutional, Gastrointestinal, Neurological, Skin, Genitourinary, Musculoskeletal, HENT, Endocrine, Psychiatric, Allergic/Imm, Heme/Lymph Last edited by [name], COT on 01/02/2024  2:34 PM.          ALLERGIES Allergies  Allergen Reactions   Reglan  [Metoclopramide ] Other (See  Comments)    Tremors, parkinson like symptoms    PAST MEDICAL HISTORY Past Medical History:  Diagnosis Date   CHF (congestive heart failure) (HCC)    COVID    COVID 02/2020   very sick - admitted to the hospital   Diabetes mellitus without complication (HCC)    Diabetic retinopathy (HCC)    Diabetic retinopathy (HCC)    GERD (gastroesophageal reflux disease)    Headache    High cholesterol    History of kidney stones    Hypertension    was diagnosed 2 years ago, never treated and states BP is always - documented 07/13/21   Ichthyosis vulgaris    Macular edema    Pneumonia    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   Past Surgical History:  Procedure Laterality Date   CARDIAC CATHETERIZATION     IR ANGIO INTRA EXTRACRAN SEL COM CAROTID INNOMINATE BILAT MOD SED  04/17/2021   IR ANGIO INTRA EXTRACRAN SEL INTERNAL CAROTID UNI R MOD SED  07/17/2021   IR ANGIO VERTEBRAL SEL VERTEBRAL BILAT MOD SED  04/17/2021   IR CT HEAD LTD  07/15/2021   IR INTRA CRAN STENT  07/15/2021   IR RADIOLOGIST EVAL & MGMT  03/05/2021   IR RADIOLOGIST EVAL & MGMT  05/04/2021   IR RADIOLOGIST EVAL & MGMT  07/31/2021   IR US  GUIDE VASC ACCESS RIGHT  04/17/2021   IR US  GUIDE VASC ACCESS RIGHT  07/15/2021   RADIOLOGY WITH ANESTHESIA N/A 07/15/2021   Procedure: STENTING;  Surgeon: Dolphus Carrion, MD;  Location: [facility] OR;  Service: Radiology;  Laterality: N/A;   TONSILLECTOMY     wisdom teeth removal     FAMILY HISTORY Family History  Problem Relation Age of Onset   Bursitis Mother    Bladder Cancer Mother 37       smoker   SOCIAL HISTORY Social History   Tobacco Use   Smoking status: Never   Smokeless tobacco: Never  Vaping Use   Vaping status: Never Used  Substance Use Topics   Alcohol use: Not Currently   Drug use: Never       OPHTHALMIC EXAM:  Base Eye Exam     Visual Acuity (Snellen - Linear)       Right Left   Dist cc 20/60 -2 20/150 -2   Dist ph cc 20/50 -2 NI     Correction: Glasses         Tonometry (Tonopen, 2:32 PM)       Right Left   Pressure 11 12         Pupils       Pupils Dark Light Shape React APD   Right PERRL 3 2 Round Brisk None   Left PERRL 3 2 Round Brisk None  Visual Fields       Left Right    Full Full         Extraocular Movement       Right Left    Full, Ortho Full, Ortho         Neuro/Psych     Oriented x3: Yes   Mood/Affect: Normal         Dilation     Both eyes: 1.0% Mydriacyl, 2.5% Phenylephrine  @ 2:33 PM           Slit Lamp and Fundus Exam     Slit Lamp Exam       Right Left   Lids/Lashes Dermatochalasis - upper lid, Meibomian gland dysfunction, +Scurf Dermatochalasis - upper lid, Meibomian gland dysfunction, +Scurf   Conjunctiva/Sclera White and quiet White and quiet   Cornea trace PEE, mild tear film debris 1+PEE, mild tear film debris   Anterior Chamber deep and clear deep and clear   Iris Round and dilated, No NVI Round and dilated, No NVI   Lens Clear Clear   Anterior Vitreous Vitreous syneresis mild syneresis, Posterior vitreous detachment         Fundus Exam       Right Left   Disc Pink and Sharp, no heme Pink and sharp, fine NVD -- regressed, 360 exudates   C/D Ratio 0.2 0.2   Macula Blunted foveal reflex, severe edema with severe exudation -- improving, scattered DBH -- slightly improved Blunted foveal reflex, severe edema and exudation, scattered DBH -- improving   Vessels attenuated, Tortuous, +NVE greatest along inferior arcades -- regressed, +Sheathing -- improving attenuated, Tortuous, +NV- regressed, +Sheathing of arterioles- improved   Periphery Attached, scattered MA/DBH, heavy exudates greatest posteriorly Attached, +exudation posteriorly-improving, scattered MA / DBH           Refraction     Wearing Rx       Sphere Cylinder Axis Add   Right -2.50 +1.00 049 +1.00   Left -0.75 +1.25 178 +1.00            IMAGING AND PROCEDURES   Imaging and Procedures for 01/02/2024  OCT, Retina - OU - Both Eyes       Right Eye Quality was good. Central Foveal Thickness: 300. Progression has improved. Findings include no SRF, abnormal foveal contour, subretinal hyper-reflective material, intraretinal hyper-reflective material, intraretinal fluid, vitreomacular adhesion (Persistent IRF, IRHM and SRHM greatest IN macula--slightly improved).   Left Eye Quality was good. Central Foveal Thickness: 270. Progression has improved. Findings include no SRF, abnormal foveal contour, subretinal hyper-reflective material, intraretinal hyper-reflective material, epiretinal membrane, intraretinal fluid, lamellar hole, macular pucker (Persistent edema, IRHM and SRHM--slightly improved, +ERM with pucker).   Notes *Images captured and stored on drive  Diagnosis / Impression:  OD: Persistent IRF, IRHM and SRHM greatest IN macula--slightly improved  OS: Persistent edema, IRHM and SRHM--slightly improved, +ERM with pucker  Clinical management:  See below  Abbreviations: NFP - Normal foveal profile. CME - cystoid macular edema. PED - pigment epithelial detachment. IRF - intraretinal fluid. SRF - subretinal fluid. EZ - ellipsoid zone. ERM - epiretinal membrane. ORA - outer retinal atrophy. ORT - outer retinal tubulation. SRHM - subretinal hyper-reflective material. IRHM - intraretinal hyper-reflective material      Intravitreal Injection, Pharmacologic Agent - OD - Right Eye       Time Out 01/02/2024. 3:31 PM. Confirmed correct patient, procedure, site, and patient consented.   Anesthesia Topical anesthesia was used. Anesthetic medications included Lidocaine  2%,  Proparacaine 0.5%.   Procedure Preparation included 5% betadine to ocular surface, eyelid speculum. A supplied (32g) needle was used.   Injection: 6 mg faricimab -svoa 6 MG/0.05ML   Route: Intravitreal, Site: Right Eye   NDC: 49757-903-93, Lot: [id number], Expiration date:  11/21/2024, Waste: 0 mL   Post-op Post injection exam found visual acuity of at least counting fingers. The patient tolerated the procedure well. There were no complications. The patient received written and verbal post procedure care education.      Intravitreal Injection, Pharmacologic Agent - OS - Left Eye       Time Out 01/02/2024. 3:31 PM. Confirmed correct patient, procedure, site, and patient consented.   Anesthesia Topical anesthesia was used. Anesthetic medications included Lidocaine  2%, Proparacaine 0.5%.   Procedure Preparation included 5% betadine to ocular surface, eyelid speculum. A supplied (32g) needle was used.   Injection: 6 mg faricimab -svoa 6 MG/0.05ML   Route: Intravitreal, Site: Left Eye   NDC: 49757-903-93, Lot: [id number], Expiration date: 11/21/2024, Waste: 0 mL   Post-op Post injection exam found visual acuity of at least counting fingers. The patient tolerated the procedure well. There were no complications. The patient received written and verbal post procedure care education.             ASSESSMENT/PLAN:    ICD-10-CM   1. Proliferative diabetic retinopathy of both eyes with macular edema associated with type 2 diabetes mellitus (HCC)  E11.3513 OCT, Retina - OU - Both Eyes    Intravitreal Injection, Pharmacologic Agent - OD - Right Eye    Intravitreal Injection, Pharmacologic Agent - OS - Left Eye    faricimab -svoa (VABYSMO ) 6mg /0.74mL intravitreal injection    faricimab -svoa (VABYSMO ) 6mg /0.51mL intravitreal injection    DISCONTINUED: Bevacizumab  (AVASTIN ) SOLN 1.25 mg    2. Current use of insulin  (HCC)  Z79.4     3. Long term (current) use of oral hypoglycemic drugs  Z79.84     4. Hyperlipidemia associated with type 2 diabetes mellitus (HCC)  E11.69    E78.5     5. Essential hypertension  I10     6. Hypertensive retinopathy of both eyes  H35.033      1-3. Proliferative diabetic retinopathy, both eyes  - A1c 9-pt reported on 08.18.25,  7.4 on 08.11.23  - s/p IVA OS #1 (02.22.24), #2 (03.22.24), #3 (04.19.24)  -s/p IVA OD #1 (02.23.24), #2 (03.22.24) #3 (04.19.24), #4 (05.17.24), #5 (06.18.24), #6 (11.08.24)  -- IVA resistance OU ================= - s/p IVE OD #1 (06.18.24), #2 (07.16.24), #3 (08.13.24), #4 (09.10.24), #5 (10.08.24), #6 (11.08.24), #7 (12.06.24), #8 (01.03.25), #9 (01.31.25), #10 (02.28.25), #11 (03.28.25) - s/p IVE OS #1 (05.17.24), #2 (06.18.24), #3 (7.16.24), #4 (08.13.24), #5 (09.10.24), #6 (10.08.24), #7 (11.08.24), #8 (12.06.24), #9 (01.03.25), #10 (01.31.25), #11 (02.28.25), #12 (03.28.25)  -- IVE resistance OU ================= - IVV OU #1 (04.25.25), #2 (05.23.25), #3 (06.23.25), #4 (07.21.25), #5 (08.18.25), #6 (09.15.25), #7 (10.13.25)  - exam shows severe edema with scattered MA and DBH, but significant lipid component to edema OU - FA (02.21.24) shows OD: Large cluster of leaking NV along IT arcades, scattered patches of vascular non-perfusion; OS: +NVD and NVE along proximal arcades - repeat FA (10.13.25) shows OD: Interval improvement in large cluster of leaking NV along IT arcades, scattered patches of vascular non-perfusion; OS: Interval resolution of +NVD and NVE along proximal arcades - BCVA OD 20/50 from 20/60; OS 20/150 - stable - OCT shows OD: Persistent IRF, IRHM and SRHM greatest IN macula--slightly improved; OS: Persistent  edema, IRHM and SRHM, +ERM with pucker at 4 wks - recommend IVV today OU #8 (11.10.25) w/ f/u in 4 wks - pt wishes to proceed - RBA of procedure discussed, questions answered - IVV informed consent obtained and signed, 04.25.25 (OU) - see procedure note - Eylea  is approved for 2025, [email or web address] approved - Vabysmo  is approved for 2025, co-pay program approved - f/u 4 weeks -- DFE/OCT, possible injection(s)  4. Hyperlipidemia  - review of labs shows severely elevated cholesterol and lipids  - 08.11.23:  Trig  785 Chol  598  LDL  410 -  01.17.25: Trig 335   Chol 279   LDL 173 - likely contributing to severe edema and exudate  5,6. Hypertensive retinopathy OU - discussed importance of tight BP control - monitor  Ophthalmic Meds Ordered this visit:  Meds ordered this encounter  Medications   DISCONTD: Bevacizumab  (AVASTIN ) SOLN 1.25 mg   faricimab -svoa (VABYSMO ) 6mg /0.53mL intravitreal injection   faricimab -svoa (VABYSMO ) 6mg /0.69mL intravitreal injection     Return in about 4 weeks (around 01/30/2024) for PDR OU, DFE, OCT, Possible Injxn.  There are no Patient Instructions on file for this visit.  This document serves as a record of services personally performed by Redell JUDITHANN Hans, MD, PhD. It was created on their behalf by Avelina Pereyra, COA an ophthalmic technician. The creation of this record is the provider's dictation and/or activities during the visit.   Electronically signed by: Avelina GORMAN Pereyra, COT  01/02/24  5:22 PM   This document serves as a record of services personally performed by Redell JUDITHANN Hans, MD, PhD. It was created on their behalf by Wanda GEANNIE Keens, COT an ophthalmic technician. The creation of this record is the provider's dictation and/or activities during the visit.    Electronically signed by:  Wanda GEANNIE Keens, COT  01/02/24 5:22 PM   This document serves as a record of services personally performed by Redell JUDITHANN Hans, MD, PhD. It was created on their behalf by Almetta Pesa, an ophthalmic technician. The creation of this record is the provider's dictation and/or activities during the visit.    Electronically signed by: Almetta Pesa, OA, 01/02/24  5:22 PM  Redell JUDITHANN Hans, M.D., Ph.D. Diseases & Surgery of the Retina and Vitreous [facility]  I have reviewed the above documentation for accuracy and completeness, and I agree with the above. Redell JUDITHANN Hans, M.D., Ph.D. 01/02/24 5:22 PM    Abbreviations: M myopia (nearsighted); A astigmatism; H  hyperopia (farsighted); P presbyopia; Mrx spectacle prescription;  CTL contact lenses; OD right eye; OS left eye; OU both eyes  XT exotropia; ET esotropia; PEK punctate epithelial keratitis; PEE punctate epithelial erosions; DES dry eye syndrome; MGD meibomian gland dysfunction; ATs artificial tears; PFAT's preservative free artificial tears; NSC nuclear sclerotic cataract; PSC posterior subcapsular cataract; ERM epi-retinal membrane; PVD posterior vitreous detachment; RD retinal detachment; DM diabetes mellitus; DR diabetic retinopathy; NPDR non-proliferative diabetic retinopathy; PDR proliferative diabetic retinopathy; CSME clinically significant macular edema; DME diabetic macular edema; dbh dot blot hemorrhages; CWS cotton wool spot; POAG primary open angle glaucoma; C/D cup-to-disc ratio; HVF humphrey visual field; GVF goldmann visual field; OCT optical coherence tomography; IOP intraocular pressure; BRVO Branch retinal vein occlusion; CRVO central retinal vein occlusion; CRAO central retinal artery occlusion; BRAO branch retinal artery occlusion; RT retinal tear; SB scleral buckle; PPV pars plana vitrectomy; VH Vitreous hemorrhage; PRP panretinal laser photocoagulation; IVK intravitreal kenalog; VMT vitreomacular traction; MH Macular hole;  NVD  neovascularization of the disc; NVE neovascularization elsewhere; AREDS age related eye disease study; ARMD age related macular degeneration; POAG primary open angle glaucoma; EBMD epithelial/anterior basement membrane dystrophy; ACIOL anterior chamber intraocular lens; IOL intraocular lens; PCIOL posterior chamber intraocular lens; Phaco/IOL phacoemulsification with intraocular lens placement; PRK photorefractive keratectomy; LASIK laser assisted in situ keratomileusis; HTN hypertension; DM diabetes mellitus; COPD chronic obstructive pulmonary disease

## 2023-12-19 NOTE — Telephone Encounter (Signed)
 Pt has colonoscopy scheduled for Wednesday and his fiance's caregiver is sick, thus requiring him to be out this week from [place].

## 2023-12-20 ENCOUNTER — Other Ambulatory Visit: Payer: Self-pay | Admitting: *Deleted

## 2023-12-20 DIAGNOSIS — D509 Iron deficiency anemia, unspecified: Secondary | ICD-10-CM

## 2023-12-20 NOTE — Telephone Encounter (Signed)
 LMOVM to call back and mychart message also sent

## 2023-12-20 NOTE — Telephone Encounter (Signed)
 Spoke to pt's girlfriend Rosina (on dpr) and informed her of providers message and recommendations. States she will talk it over with pt to see what he wants to do and will call back.

## 2023-12-20 NOTE — Telephone Encounter (Signed)
 Pt decided to go to [facility] to have procedures done. Referral sent. FYI

## 2023-12-21 ENCOUNTER — Ambulatory Visit (HOSPITAL_COMMUNITY): Admission: RE | Admit: 2023-12-21 | Source: Home / Self Care | Admitting: Internal Medicine

## 2023-12-21 ENCOUNTER — Encounter (HOSPITAL_COMMUNITY): Admission: RE | Payer: Self-pay | Source: Home / Self Care

## 2023-12-21 ENCOUNTER — Encounter (HOSPITAL_COMMUNITY)

## 2023-12-21 SURGERY — COLONOSCOPY
Anesthesia: Choice

## 2023-12-22 ENCOUNTER — Ambulatory Visit (HOSPITAL_COMMUNITY)
Admission: RE | Admit: 2023-12-22 | Discharge: 2023-12-22 | Disposition: A | Discharge status: Home / Self Care | Source: Ambulatory Visit | Attending: Gastroenterology | Admitting: Gastroenterology

## 2023-12-22 DIAGNOSIS — D7389 Other diseases of spleen: Secondary | ICD-10-CM | POA: Diagnosis not present

## 2023-12-22 DIAGNOSIS — R932 Abnormal findings on diagnostic imaging of liver and biliary tract: Secondary | ICD-10-CM | POA: Diagnosis not present

## 2023-12-22 DIAGNOSIS — K760 Fatty (change of) liver, not elsewhere classified: Secondary | ICD-10-CM | POA: Insufficient documentation

## 2023-12-22 DIAGNOSIS — R161 Splenomegaly, not elsewhere classified: Secondary | ICD-10-CM | POA: Insufficient documentation

## 2023-12-22 DIAGNOSIS — K811 Chronic cholecystitis: Secondary | ICD-10-CM | POA: Diagnosis not present

## 2023-12-23 ENCOUNTER — Encounter (HOSPITAL_COMMUNITY)

## 2023-12-24 DIAGNOSIS — R6889 Other general symptoms and signs: Secondary | ICD-10-CM | POA: Diagnosis not present

## 2023-12-26 ENCOUNTER — Encounter (INDEPENDENT_AMBULATORY_CARE_PROVIDER_SITE_OTHER): Admitting: Ophthalmology

## 2023-12-26 ENCOUNTER — Encounter (HOSPITAL_COMMUNITY)
Admission: RE | Admit: 2023-12-26 | Discharge: 2023-12-26 | Disposition: A | Discharge status: Home / Self Care | Source: Ambulatory Visit | Attending: Internal Medicine | Admitting: Internal Medicine

## 2023-12-26 DIAGNOSIS — I5022 Chronic systolic (congestive) heart failure: Secondary | ICD-10-CM | POA: Insufficient documentation

## 2023-12-27 NOTE — Progress Notes (Signed)
 Cardiac Individual Treatment Plan  Patient Details  Name: Jose Koch MRN: [id number] Date of Birth: 03-Aug-1970 Referring Provider:   Flowsheet Row INTENSIVE CARDIAC REHAB ORIENT from 10/18/2023 in [facility]  Referring Provider Diannah Maywood, MD    Initial Encounter Date:  Flowsheet Row INTENSIVE CARDIAC REHAB ORIENT from 10/18/2023 in [facility], [facility]  Date 10/18/23    Visit Diagnosis: Heart failure, chronic systolic (HCC)  Patient's Home Medications on Admission:  Current Outpatient Medications:    aspirin  EC 81 MG tablet, Take 81 mg by mouth daily. Swallow whole., Disp: , Rfl:    atorvastatin  (LIPITOR) 40 MG tablet, Take 1 tablet (40 mg total) by mouth daily., Disp: 90 tablet, Rfl: 2   blood glucose meter kit and supplies KIT, Dispense based on patient and insurance preference (something with affordable test strips). Use to check glucose twice daily as directed., Disp: 1 each, Rfl: 0   empagliflozin  (JARDIANCE ) 25 MG TABS tablet, Take 1 tablet (25 mg total) by mouth daily., Disp: 30 tablet, Rfl: 5   Evolocumab  (REPATHA  SURECLICK) 140 MG/ML SOAJ, Inject 140 mg into the skin every 14 (fourteen) days., Disp: 6 mL, Rfl: 1   fenofibrate  160 MG tablet, Take 1 tablet (160 mg total) by mouth daily., Disp: 90 tablet, Rfl: 1   ferrous sulfate 325 (65 FE) MG tablet, Take by mouth daily., Disp: , Rfl:    folic acid  (FOLVITE ) 1 MG tablet, TAKE 1 TABLET BY MOUTH EVERY DAY, Disp: 90 tablet, Rfl: 1   glucose blood (ACCU-CHEK GUIDE) test strip, Use as instructed to monitor glucose twice daily, before breakfast and before bed, Disp: 100 each, Rfl: 12   icosapent  Ethyl (VASCEPA ) 1 g capsule, Take 2 capsules (2 g total) by mouth 2 (two) times daily., Disp: 120 capsule, Rfl: 5   Insulin  Glargine (BASAGLAR  KWIKPEN) 100 UNIT/ML, Inject 30 Units into the skin at bedtime., Disp: , Rfl:     Insulin  Pen Needle 31G X 5 MM MISC, 1 Units by Does not apply route daily., Disp: 100 each, Rfl: 1   JARDIANCE  25 MG TABS tablet, Take 25 mg by mouth daily., Disp: , Rfl:    metoprolol  succinate (TOPROL  XL) 25 MG 24 hr tablet, Take 0.5 tablets (12.5 mg total) by mouth daily., Disp: 45 tablet, Rfl: 2   Misc Natural Products (OSTEO BI-FLEX ADV DOUBLE ST PO), Take 1 tablet by mouth., Disp: , Rfl:    nitroGLYCERIN  (NITROSTAT ) 0.4 MG SL tablet, Place 1 tablet (0.4 mg total) under the tongue every 5 (five) minutes x 3 doses as needed (if no relief after 2nd dose, proceed to the ED or call 911)., Disp: 25 tablet, Rfl: 3   OneTouch Delica Lancets 33G MISC, PLEASE SEE ATTACHED FOR DETAILED DIRECTIONS, Disp: , Rfl:    pantoprazole  (PROTONIX ) 40 MG tablet, TAKE 1 TABLET BY MOUTH 2 TIMES DAILY BEFORE A MEAL 30 MINUTES BEFORE BREAKFAST AND DINNER, Disp: 60 tablet, Rfl: 3   sitaGLIPtin  (JANUVIA ) 100 MG tablet, Take 1 tablet (100 mg total) by mouth daily., Disp: 90 tablet, Rfl: 0   sodium bicarbonate  650 MG tablet, Take 2 tablets (1,300 mg total) by mouth 2 (two) times daily, in the morning and evening. (Patient not taking: Reported on 12/16/2023), Disp: 120 tablet, Rfl: 11   ticagrelor  (BRILINTA ) 90 MG TABS tablet, Take 1 tablet (90 mg total) by mouth 2 (two) times daily., Disp: 60 tablet,  Rfl: 11  Past Medical History: Past Medical History:  Diagnosis Date   CHF (congestive heart failure) (HCC)    COVID    COVID 02/2020   very sick - admitted to the hospital   Diabetes mellitus without complication (HCC)    Diabetic retinopathy (HCC)    Diabetic retinopathy (HCC)    GERD (gastroesophageal reflux disease)    Headache    High cholesterol    History of kidney stones    Hypertension    was diagnosed 2 years ago, never treated and states BP is always - documented 07/13/21   Ichthyosis vulgaris    Macular edema    Pneumonia    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    Tobacco Use: Social History    Tobacco Use  Smoking Status Never  Smokeless Tobacco Never    Labs: Review Flowsheet       Latest Ref Rng & Units 04/06/2020 04/14/2020 08/18/2020 06/12/2021  Labs for ITP Cardiac and Pulmonary Rehab  Cholestrol 0 - 200 mg/dL - 507  - -  LDL (calc) 0 - 99 mg/dL - UNABLE TO CALCULATE IF TRIGLYCERIDE OVER 400 mg/dL  - -  Direct LDL 0 - 99 mg/dL - 893.6  - -  HDL-C >59 mg/dL - 34  - -  Trlycerides <150 mg/dL - 252  - -  Hemoglobin A1c 4.8 - 5.6 % 12.0  - 12.5  8.1     Capillary Blood Glucose: Lab Results  Component Value Date   GLUCAP 137 (H) 10/31/2023   GLUCAP 140 (H) 10/28/2023   GLUCAP 165 (H) 10/28/2023   GLUCAP 161 (H) 10/26/2023   GLUCAP 213 (H) 10/26/2023     Exercise Target Goals: Exercise Program Goal: Individual exercise prescription set using results from initial 6 min walk test and THRR while considering  patient's activity barriers and safety.   Exercise Prescription Goal: Initial exercise prescription builds to 30-45 minutes a day of aerobic activity, 2-3 days per week.  Home exercise guidelines will be given to patient during program as part of exercise prescription that the participant will acknowledge.  Activity Barriers & Risk Stratification:  Activity Barriers & Cardiac Risk Stratification - 10/18/23 1249       Activity Barriers & Cardiac Risk Stratification   Activity Barriers Back Problems;Neck/Spine Problems;Deconditioning;Muscular Weakness;Decreased Ventricular Function;Balance Concerns    Cardiac Risk Stratification High          6 Minute Walk:  6 Minute Walk     Row Name 10/18/23 1243         6 Minute Walk   Phase Initial     Distance 1080 feet     Walk Time 6 minutes     # of Rest Breaks 1  5:52-6:00 due to ankle pain     MPH 2     METS 3.6     RPE 11     Perceived Dyspnea  0     VO2 Peak 12.5     Symptoms Yes (comment)     Comments Chronic Left ankle/calf pain 6/10;  chronic rt calf pain 3/10     Resting HR 95 bpm      Resting BP 114/80     Resting Oxygen Saturation  98 %     Exercise Oxygen Saturation  during 6 min walk 99 %     Max Ex. HR 108 bpm     Max Ex. BP 88/67  pt asymptomatic  2 Minute Post BP 115/75        Oxygen Initial Assessment:   Oxygen Re-Evaluation:   Oxygen Discharge (Final Oxygen Re-Evaluation):   Initial Exercise Prescription:  Initial Exercise Prescription - 10/18/23 1200       Date of Initial Exercise RX and Referring Provider   Date 10/18/23    Referring Provider Vishnu Mallipeddi, MD    Expected Discharge Date 01/13/24      Recumbant Bike   Level 1    RPM 60    Watts 36    Minutes 15    METs 3.6      T5 Nustep   Level 1    SPM 75    Minutes 15    METs 3.6      Prescription Details   Frequency (times per week) 3    Duration Progress to 30 minutes of continuous aerobic without signs/symptoms of physical distress      Intensity   THRR 40-80% of Max Heartrate 68-135    Ratings of Perceived Exertion 11-13    Perceived Dyspnea 0-4      Progression   Progression Continue progressive overload as per policy without signs/symptoms or physical distress.      Resistance Training   Training Prescription Yes    Weight 3lbs    Reps 10-15          Perform Capillary Blood Glucose checks as needed.  Exercise Prescription Changes:   Exercise Prescription Changes     Row Name 10/26/23 1600 11/09/23 1600 11/23/23 1600 12/07/23 1500 12/26/23 1500     Response to Exercise   Blood Pressure (Admit) 110/64 104/68 100/70 108/70 94/62   Blood Pressure (Exercise) 120/70 116/64 -- -- --  over 10 sessions   Blood Pressure (Exit) 101/71 98/62 96/68  96/60 90/60   Heart Rate (Admit) 88 bpm 86 bpm 92 bpm 97 bpm 93 bpm   Heart Rate (Exercise) 106 bpm 96 bpm 110 bpm 110 bpm 121 bpm   Heart Rate (Exit) 97 bpm 80 bpm 93 bpm 94 bpm 85 bpm   Rating of Perceived Exertion (Exercise) 12 11 12 10 10    Perceived Dyspnea (Exercise) -- -- -- -- 0   Symptoms Left ankle pain  None None 4/10 chronic left ankle pain none   Comments Pt's first day in the CRP2 program Reviewed METs Reviewed METs and goals Reviewed METs reviewed mets and goals   Duration Continue with 30 min of aerobic exercise without signs/symptoms of physical distress. Progress to 30 minutes of  aerobic without signs/symptoms of physical distress Continue with 30 min of aerobic exercise without signs/symptoms of physical distress. Continue with 30 min of aerobic exercise without signs/symptoms of physical distress. Continue with 30 min of aerobic exercise without signs/symptoms of physical distress.   Intensity THRR unchanged THRR unchanged THRR unchanged THRR unchanged THRR unchanged     Progression   Progression Continue to progress workloads to maintain intensity without signs/symptoms of physical distress. Continue to progress workloads to maintain intensity without signs/symptoms of physical distress. Continue to progress workloads to maintain intensity without signs/symptoms of physical distress. Continue to progress workloads to maintain intensity without signs/symptoms of physical distress. Continue to progress workloads to maintain intensity without signs/symptoms of physical distress.   Average METs 1.7 1.85 2.2 2.2 2.4     Resistance Training   Training Prescription No No No No Yes   Weight No wts on Wednesday No wts on Wednesday No wts on Wednesday No wts on  Wednesday 3.0   Reps -- 10-15 -- -- 10-15   Time -- -- -- -- 5 Minutes     Interval Training   Interval Training No No No No --     Recumbant Bike   Level 1 1 2 2 2    RPM 48 56 59 57 64   Watts 10 12 20 19 21    Minutes 15 15 15 15 15    METs 1.8 1.8 2.3 2.2 2.4     T5 Nustep   Level 1 1 1 1 2    SPM 56 69 86 57 94   Minutes 15 15 15 15 15    METs 1.6 1.9 2.1 2.2 2.4      Exercise Comments:   Exercise Comments     Row Name 10/26/23 1628 11/09/23 1653 11/23/23 1636 12/07/23 1500 12/26/23 1606   Exercise Comments Pt's first  day in the CRP2 program. Pt had chronic left ankle pain on both pieces of equipment. Will continue to monitor. Reviewed METs. Pt has progressed from peak METs of 1.8 on entry to a high of 2.7. Reviewed METS and goals. Increased workload on bike today. Pt is inconsistant with his workout due to his ankle. It depends on how it feels as to how hard he will push himself. Reviewed METs. Pt is not making significant progress. Pt has chronic left ankle pain during exercise. We use seated modailites due to this problem. His ability to increased workload/METs is dependent on his ankle. Pt has had peak MEts of 2.7. Pt does report that he feels stronger and is now able to get down and get back up again form the floor. Prior to starting the program he could not do this. Reviewed METs and goals with pt. He is tolerating exercise well with slow progression and avg MET level of 2.3. He is achieving his goals and feeling better.      Exercise Goals and Review:   Exercise Goals     Row Name 10/18/23 1039             Exercise Goals   Increase Physical Activity Yes       Intervention Provide advice, education, support and counseling about physical activity/exercise needs.;Develop an individualized exercise prescription for aerobic and resistive training based on initial evaluation findings, risk stratification, comorbidities and participant's personal goals.       Expected Outcomes Short Term: Attend rehab on a regular basis to increase amount of physical activity.;Long Term: Add in home exercise to make exercise part of routine and to increase amount of physical activity.;Long Term: Exercising regularly at least 3-5 days a week.       Increase Strength and Stamina Yes       Intervention Provide advice, education, support and counseling about physical activity/exercise needs.;Develop an individualized exercise prescription for aerobic and resistive training based on initial evaluation findings, risk stratification,  comorbidities and participant's personal goals.       Expected Outcomes Short Term: Increase workloads from initial exercise prescription for resistance, speed, and METs.;Short Term: Perform resistance training exercises routinely during rehab and add in resistance training at home;Long Term: Improve cardiorespiratory fitness, muscular endurance and strength as measured by increased METs and functional capacity ( )       Able to understand and use rate of perceived exertion (RPE) scale Yes       Intervention Provide education and explanation on how to use RPE scale       Expected Outcomes Short Term: Able  to use RPE daily in rehab to express subjective intensity level;Long Term:  Able to use RPE to guide intensity level when exercising independently       Knowledge and understanding of Target Heart Rate Range (THRR) Yes       Intervention Provide education and explanation of THRR including how the numbers were predicted and where they are located for reference       Expected Outcomes Short Term: Able to state/look up THRR;Short Term: Able to use daily as guideline for intensity in rehab;Long Term: Able to use THRR to govern intensity when exercising independently       Understanding of Exercise Prescription Yes       Intervention Provide education, explanation, and written materials on patient's individual exercise prescription       Expected Outcomes Short Term: Able to explain program exercise prescription;Long Term: Able to explain home exercise prescription to exercise independently          Exercise Goals Re-Evaluation :  Exercise Goals Re-Evaluation     Row Name 10/26/23 1627 11/23/23 1634 12/26/23 1602         Exercise Goal Re-Evaluation   Exercise Goals Review Increase Physical Activity;Understanding of Exercise Prescription;Increase Strength and Stamina;Knowledge and understanding of Target Heart Rate Range (THRR);Able to understand and use rate of perceived exertion (RPE) scale  Increase Physical Activity;Understanding of Exercise Prescription;Increase Strength and Stamina;Knowledge and understanding of Target Heart Rate Range (THRR);Able to understand and use rate of perceived exertion (RPE) scale Increase Physical Activity;Increase Strength and Stamina;Able to understand and use rate of perceived exertion (RPE) scale;Knowledge and understanding of Target Heart Rate Range (THRR);Understanding of Exercise Prescription     Comments Pt's first day in the CRP2 program. Pt understandas the exercise Rx, RPE sclae and THRR. Reviewed METs and goals with patient today. Pt voices progress on his goal of improved strength and stamina. Reviewed METs and goals with pt. Pt is very limited by chronic ankle pain, which limits MET progression on both modalities. He is tolerating an avg MET level of 2.4 without s/sx. He feels he is making progress towards his goals, and he is able to get up off the floor without assistance now which he is happy about.     Expected Outcomes Will continue to monitor the patient and progress exercise workloads as tolerated. Will continue to monitor the patient and progress exercise workloads as tolerated. Will continue to progress workloads as tolerated without s/sx.        Discharge Exercise Prescription (Final Exercise Prescription Changes):  Exercise Prescription Changes - 12/26/23 1500       Response to Exercise   Blood Pressure (Admit) 94/62    Blood Pressure (Exercise) --   over 10 sessions   Blood Pressure (Exit) 90/60    Heart Rate (Admit) 93 bpm    Heart Rate (Exercise) 121 bpm    Heart Rate (Exit) 85 bpm    Rating of Perceived Exertion (Exercise) 10    Perceived Dyspnea (Exercise) 0    Symptoms none    Comments reviewed mets and goals    Duration Continue with 30 min of aerobic exercise without signs/symptoms of physical distress.    Intensity THRR unchanged      Progression   Progression Continue to progress workloads to maintain intensity  without signs/symptoms of physical distress.    Average METs 2.4      Resistance Training   Training Prescription Yes    Weight 3.0    Reps  10-15    Time 5 Minutes      Recumbant Bike   Level 2    RPM 64    Watts 21    Minutes 15    METs 2.4      T5 Nustep   Level 2    SPM 94    Minutes 15    METs 2.4          Nutrition:  Target Goals: Understanding of nutrition guidelines, daily intake of sodium 1500mg , cholesterol 200mg , calories 30% from fat and 7% or less from saturated fats, daily to have 5 or more servings of fruits and vegetables.  Biometrics:  Pre Biometrics - 10/18/23 1100       Pre Biometrics   Waist Circumference 37.5 inches    Hip Circumference 39 inches    Waist to Hip Ratio 0.96 %    Triceps Skinfold 14 mm    % Body Fat 23.9 %    Grip Strength 23 kg    Flexibility 13.5 in    Single Leg Stand 8 seconds           Nutrition Therapy Plan and Nutrition Goals:   Nutrition Assessments:  MEDIFICTS Score Key: >=70 Need to make dietary changes  40-70 Heart Healthy Diet <= 40 Therapeutic Level Cholesterol Diet   Flowsheet Row INTENSIVE CARDIAC REHAB from 10/26/2023 in [facility] Lung [facility]  Picture Your Plate Total Score on Admission 56   Picture Your Plate Scores: <59 Unhealthy dietary pattern with much room for improvement. 41-50 Dietary pattern unlikely to meet recommendations for good health and room for improvement. 51-60 More healthful dietary pattern, with some room for improvement.  >60 Healthy dietary pattern, although there may be some specific behaviors that could be improved.    Nutrition Goals Re-Evaluation:   Nutrition Goals Re-Evaluation:   Nutrition Goals Discharge (Final Nutrition Goals Re-Evaluation):   Psychosocial: Target Goals: Acknowledge presence or absence of significant depression and/or stress, maximize coping skills, provide positive support system. Participant  is able to verbalize types and ability to use techniques and skills needed for reducing stress and depression.  Initial Review & Psychosocial Screening:  Initial Psych Review & Screening - 10/18/23 1450       Initial Review   Current issues with Current Stress Concerns    Source of Stress Concerns Chronic Illness;Family    Comments Jose Koch has mutiple health conditions. Jose Koch fiancee is bedridden. Jose Koch finacee's Mom takes him to his appointments,      Family Dynamics   Good Support System? Yes   Dairon lives with his fiancee, his Fiancee's mother and father     Barriers   Psychosocial barriers to participate in program The patient should benefit from training in stress management and relaxation.      Screening Interventions   Interventions Encouraged to exercise;To provide support and resources with identified psychosocial needs;Provide feedback about the scores to participant    Expected Outcomes Long Term Goal: Stressors or current issues are controlled or eliminated.;Short Term goal: Identification and review with participant of any Quality of Life or Depression concerns found by scoring the questionnaire.          Quality of Life Scores:  Quality of Life - 10/18/23 1123       Quality of Life   Select Quality of Life      Quality of Life Scores   Health/Function Pre 23.4 %    Socioeconomic Pre 27.14 %  Psych/Spiritual Pre 26.14 %    Family Pre 26.4 %    GLOBAL Pre 25.18 %         Scores of 19 and below usually indicate a poorer quality of life in these areas.  A difference of  2-3 points is a clinically meaningful difference.  A difference of 2-3 points in the total score of the Quality of Life Index has been associated with significant improvement in overall quality of life, self-image, physical symptoms, and general health in studies assessing change in quality of life.  PHQ-9: Review Flowsheet       10/18/2023 05/26/2020 05/12/2020  Depression screen PHQ 2/9   Decreased Interest 0 0 0  Down, Depressed, Hopeless 0 0 0  PHQ - 2 Score 0 0 0  Altered sleeping 1 - -  Tired, decreased energy 1 - -  Change in appetite 0 - -  Feeling bad or failure about yourself  0 - -  Trouble concentrating 0 - -  Moving slowly or fidgety/restless 0 - -  Suicidal thoughts 0 - -  PHQ-9 Score 2 - -  Difficult doing work/chores Not difficult at all - -   Interpretation of Total Score  Total Score Depression Severity:  1-4 = Minimal depression, 5-9 = Mild depression, 10-14 = Moderate depression, 15-19 = Moderately severe depression, 20-27 = Severe depression   Psychosocial Evaluation and Intervention:   Psychosocial Re-Evaluation:  Psychosocial Re-Evaluation     Row Name 10/28/23 1438 11/22/23 1434 12/23/23 0844         Psychosocial Re-Evaluation   Current issues with Current Stress Concerns Current Stress Concerns Current Stress Concerns     Comments Jose Koch has mutiple health conditions Murcia' fiancee is bedridden; Sander' finacee's Mom takes him to his appointments), but has not voiced any additional stressors or concerns during exercise thus far at [facility]. Morrison has not voiced any additional concerns during exercise at cardiac rehab since his last 30 day ITP. Elma has not voiced any additional concerns during exercise at cardiac rehab since his last 30 day ITP, but does have to call out from time to time due to caregiver responsibilities.     Expected Outcomes Pt will have controlled or decreased stressors upon completion of caridac rehab. Pt will have controlled or decreased stressors upon completion of caridac rehab. Pt will have controlled or decreased stressors upon completion of caridac rehab.     Interventions Stress management education;Relaxation education;Encouraged to attend Cardiac Rehabilitation for the exercise Stress management education;Relaxation education;Encouraged to attend Cardiac Rehabilitation for the exercise Stress management  education;Relaxation education;Encouraged to attend Cardiac Rehabilitation for the exercise     Continue Psychosocial Services  Follow up required by staff Follow up required by staff Follow up required by staff        Psychosocial Discharge (Final Psychosocial Re-Evaluation):  Psychosocial Re-Evaluation - 12/23/23 0844       Psychosocial Re-Evaluation   Current issues with Current Stress Concerns    Comments Jose Koch has not voiced any additional concerns during exercise at cardiac rehab since his last 30 day ITP, but does have to call out from time to time due to caregiver responsibilities.    Expected Outcomes Pt will have controlled or decreased stressors upon completion of caridac rehab.    Interventions Stress management education;Relaxation education;Encouraged to attend Cardiac Rehabilitation for the exercise    Continue Psychosocial Services  Follow up required by staff          Vocational Rehabilitation: Provide  vocational rehab assistance to qualifying candidates.   Vocational Rehab Evaluation & Intervention:  Vocational Rehab - 10/18/23 1452       Initial Vocational Rehab Evaluation & Intervention   Assessment shows need for Vocational Rehabilitation No   Jose Koch is unemployed and is not interested in vocational rehab at this time         Education: Education Goals: Education classes will be provided on a weekly basis, covering required topics. Participant will state understanding/return demonstration of topics presented.    Education     Row Name 10/26/23 1300     Education   Cardiac Education Topics Pritikin   Orthoptist   Educator Dietitian   Weekly Topic One-Pot Wonders   Instruction Review Code 1- Verbalizes Understanding   Class Start Time 1400   Class Stop Time 1440   Class Time Calculation (min) 40 min    Row Name 10/28/23 1300     Education   Cardiac Education Topics Pritikin   Environmental Education Officer General Education   General Education Hypertension and Heart Disease   Instruction Review Code 1- Verbalizes Understanding   Class Start Time 1403   Class Stop Time 1440   Class Time Calculation (min) 37 min    Row Name 10/31/23 1300     Education   Cardiac Education Topics Pritikin   Geographical Information Systems Officer Psychosocial   Psychosocial Workshop Focused Goals, Sustainable Changes   Instruction Review Code 1- Verbalizes Understanding   Class Start Time 1359   Class Stop Time 1434   Class Time Calculation (min) 35 min    Row Name 11/02/23 1300     Education   Cardiac Education Topics Pritikin   Customer Service Manager   Weekly Topic Comforting Weekend Breakfasts   Instruction Review Code 1- Verbalizes Understanding   Class Start Time 1358   Class Stop Time 1439   Class Time Calculation (min) 41 min    Row Name 11/04/23 1300     Education   Cardiac Education Topics Pritikin   Nurse, Children's Exercise Physiologist   Select Nutrition   Nutrition Dining Out - Part 1   Instruction Review Code 1- Verbalizes Understanding   Class Start Time 1400   Class Stop Time 1440   Class Time Calculation (min) 40 min    Row Name 11/09/23 1300     Education   Cardiac Education Topics Pritikin   Secondary School Teacher School   Educator Nurse;Respiratory Therapist   Weekly Topic Fast Evening Meals   Instruction Review Code 1- Verbalizes Understanding   Class Start Time 1153   Class Stop Time 1224   Class Time Calculation (min) 31 min    Row Name 11/11/23 1300     Education   Cardiac Education Topics Pritikin   Select Core Videos     Core Videos   Educator Dietitian   Select Nutrition   Nutrition Vitamins and Minerals   Instruction Review Code 1- Verbalizes Understanding   Class Start Time 1357   Class  Stop Time 1440   Class Time Calculation (min) 43 min    Row Name 11/14/23 1300     Education  Cardiac Education Topics Pritikin   Select Core Videos     Core Videos   Educator Exercise Physiologist   Select Exercise Education   Exercise Education Improving Performance   Instruction Review Code 1- Verbalizes Understanding   Class Start Time 1400   Class Stop Time 1438   Class Time Calculation (min) 38 min    Row Name 11/16/23 1300     Education   Cardiac Education Topics Pritikin   Customer Service Manager   Weekly Topic International Cuisine- Spotlight on the [employer] Zones   Instruction Review Code 1- Verbalizes Understanding   Class Start Time 1358   Class Stop Time 1435   Class Time Calculation (min) 37 min    Row Name 11/21/23 1300     Education   Cardiac Education Topics Pritikin   Geographical Information Systems Officer Psychosocial   Psychosocial Workshop Healthy Sleep for a Healthy Heart   Instruction Review Code 1- Verbalizes Understanding   Class Start Time 1400   Class Stop Time 1445   Class Time Calculation (min) 45 min    Row Name 11/23/23 1300     Education   Cardiac Education Topics Pritikin   Customer Service Manager   Weekly Topic Simple Sides and Sauces   Instruction Review Code 1- Verbalizes Understanding   Class Start Time 1400   Class Stop Time 1435   Class Time Calculation (min) 35 min    Row Name 11/25/23 1200     Education   Cardiac Education Topics Pritikin   Nurse, Children's Exercise Physiologist   Select Psychosocial   Psychosocial How Our Thoughts Can Heal Our Hearts   Instruction Review Code 1- Verbalizes Understanding   Class Start Time 1400   Class Stop Time 1436   Class Time Calculation (min) 36 min    Row Name 11/30/23 1300     Education   Cardiac Education Topics Pritikin    Customer Service Manager   Weekly Topic Powerhouse Plant-Based Proteins   Instruction Review Code 1- Verbalizes Understanding   Class Start Time 1400   Class Stop Time 1436   Class Time Calculation (min) 36 min    Row Name 12/02/23 1400     Education   Cardiac Education Topics Pritikin   Licensed Conveyancer Nutrition   Nutrition Facts on Fat   Instruction Review Code 1- Verbalizes Understanding   Class Start Time 1357   Class Stop Time 1436   Class Time Calculation (min) 39 min    Row Name 12/05/23 1300     Education   Cardiac Education Topics Pritikin   Geographical Information Systems Officer Psychosocial   Psychosocial Workshop From Head to Heart: The Power of a Healthy Outlook   Instruction Review Code 1- Verbalizes Understanding   Class Start Time 1400   Class Stop Time 1446   Class Time Calculation (min) 46 min    Row Name 12/07/23 1300     Education   Cardiac Education Topics --   Select --     Hospital Doctor --  Weekly Topic --   Instruction Review Code --    Row Name 12/09/23 1200     Education   Select Workshops     Workshops   Select Exercise   Exercise Workshop Managing Heart Disease: Your Path to a Healthier Heart   Instruction Review Code 1- Verbalizes Understanding   Class Start Time 1405   Class Stop Time 1453   Class Time Calculation (min) 48 min    Row Name 12/12/23 1300     Education   Cardiac Education Topics Pritikin   Writer Psychosocial   Psychosocial Healthy Minds, Bodies, Hearts   Instruction Review Code 1- Verbalizes Understanding   Class Start Time 1400   Class Stop Time 1434   Class Time Calculation (min) 34 min      Core Videos: Exercise    Move It!  Clinical staff conducted group or individual video education with verbal and  written material and guidebook.  Patient learns the recommended Pritikin exercise program. Exercise with the goal of living a long, healthy life. Some of the health benefits of exercise include controlled diabetes, healthier blood pressure levels, improved cholesterol levels, improved heart and lung capacity, improved sleep, and better body composition. Everyone should speak with their doctor before starting or changing an exercise routine.  Biomechanical Limitations Clinical staff conducted group or individual video education with verbal and written material and guidebook.  Patient learns how biomechanical limitations can impact exercise and how we can mitigate and possibly overcome limitations to have an impactful and balanced exercise routine.  Body Composition Clinical staff conducted group or individual video education with verbal and written material and guidebook.  Patient learns that body composition (ratio of muscle mass to fat mass) is a key component to assessing overall fitness, rather than body weight alone. Increased fat mass, especially visceral belly fat, can put us  at increased risk for metabolic syndrome, type 2 diabetes, heart disease, and even death. It is recommended to combine diet and exercise (cardiovascular and resistance training) to improve your body composition. Seek guidance from your physician and exercise physiologist before implementing an exercise routine.  Exercise Action Plan Clinical staff conducted group or individual video education with verbal and written material and guidebook.  Patient learns the recommended strategies to achieve and enjoy long-term exercise adherence, including variety, self-motivation, self-efficacy, and positive decision making. Benefits of exercise include fitness, good health, weight management, more energy, better sleep, less stress, and overall well-being.  Medical   Heart Disease Risk Reduction Clinical staff conducted group or  individual video education with verbal and written material and guidebook.  Patient learns our heart is our most vital organ as it circulates oxygen, nutrients, white blood cells, and hormones throughout the entire body, and carries waste away. Data supports a plant-based eating plan like the Pritikin Program for its effectiveness in slowing progression of and reversing heart disease. The video provides a number of recommendations to address heart disease.   Metabolic Syndrome and Belly Fat  Clinical staff conducted group or individual video education with verbal and written material and guidebook.  Patient learns what metabolic syndrome is, how it leads to heart disease, and how one can reverse it and keep it from coming back. You have metabolic syndrome if you have 3 of the following 5 criteria: abdominal obesity, high blood pressure, high triglycerides, low HDL cholesterol, and high blood sugar.  Hypertension and Heart Disease  Clinical staff conducted group or individual video education with verbal and written material and guidebook.  Patient learns that high blood pressure, or hypertension, is very common in the [place] . Hypertension is largely due to excessive salt intake, but other important risk factors include being overweight, physical inactivity, drinking too much alcohol, smoking, and not eating enough potassium from fruits and vegetables. High blood pressure is a leading risk factor for heart attack, stroke, congestive heart failure, dementia, kidney failure, and premature death. Long-term effects of excessive salt intake include stiffening of the arteries and thickening of heart muscle and organ damage. Recommendations include ways to reduce hypertension and the risk of heart disease.  Diseases of Our Time - Focusing on Diabetes Clinical staff conducted group or individual video education with verbal and written material and guidebook.  Patient learns why the best way to stop diseases  of our time is prevention, through food and other lifestyle changes. Medicine (such as prescription pills and surgeries) is often only a Band-Aid on the problem, not a long-term solution. Most common diseases of our time include obesity, type 2 diabetes, hypertension, heart disease, and cancer. The Pritikin Program is recommended and has been proven to help reduce, reverse, and/or prevent the damaging effects of metabolic syndrome.  Nutrition   Overview of the Pritikin Eating Plan  Clinical staff conducted group or individual video education with verbal and written material and guidebook.  Patient learns about the Pritikin Eating Plan for disease risk reduction. The Pritikin Eating Plan emphasizes a wide variety of unrefined, minimally-processed carbohydrates, like fruits, vegetables, whole grains, and legumes. Go, Caution, and Stop food choices are explained. Plant-based and lean animal proteins are emphasized. Rationale provided for low sodium intake for blood pressure control, low added sugars for blood sugar stabilization, and low added fats and oils for coronary artery disease risk reduction and weight management.  Calorie Density  Clinical staff conducted group or individual video education with verbal and written material and guidebook.  Patient learns about calorie density and how it impacts the Pritikin Eating Plan. Knowing the characteristics of the food you choose will help you decide whether those foods will lead to weight gain or weight loss, and whether you want to consume more or less of them. Weight loss is usually a side effect of the Pritikin Eating Plan because of its focus on low calorie-dense foods.  Label Reading  Clinical staff conducted group or individual video education with verbal and written material and guidebook.  Patient learns about the Pritikin recommended label reading guidelines and corresponding recommendations regarding calorie density, added sugars, sodium content,  and whole grains.  Dining Out - Part 1  Clinical staff conducted group or individual video education with verbal and written material and guidebook.  Patient learns that restaurant meals can be sabotaging because they can be so high in calories, fat, sodium, and/or sugar. Patient learns recommended strategies on how to positively address this and avoid unhealthy pitfalls.  Facts on Fats  Clinical staff conducted group or individual video education with verbal and written material and guidebook.  Patient learns that lifestyle modifications can be just as effective, if not more so, as many medications for lowering your risk of heart disease. A Pritikin lifestyle can help to reduce your risk of inflammation and atherosclerosis (cholesterol build-up, or plaque, in the artery walls). Lifestyle interventions such as dietary choices and physical activity address the cause of atherosclerosis. A review of the types of fats and their impact on blood  cholesterol levels, along with dietary recommendations to reduce fat intake is also included.  Nutrition Action Plan  Clinical staff conducted group or individual video education with verbal and written material and guidebook.  Patient learns how to incorporate Pritikin recommendations into their lifestyle. Recommendations include planning and keeping personal health goals in mind as an important part of their success.  Healthy Mind-Set    Healthy Minds, Bodies, Hearts  Clinical staff conducted group or individual video education with verbal and written material and guidebook.  Patient learns how to identify when they are stressed. Video will discuss the impact of that stress, as well as the many benefits of stress management. Patient will also be introduced to stress management techniques. The way we think, act, and feel has an impact on our hearts.  How Our Thoughts Can Heal Our Hearts  Clinical staff conducted group or individual video education with verbal  and written material and guidebook.  Patient learns that negative thoughts can cause depression and anxiety. This can result in negative lifestyle behavior and serious health problems. Cognitive behavioral therapy is an effective method to help control our thoughts in order to change and improve our emotional outlook.  Additional Videos:  Exercise    Improving Performance  Clinical staff conducted group or individual video education with verbal and written material and guidebook.  Patient learns to use a non-linear approach by alternating intensity levels and lengths of time spent exercising to help burn more calories and lose more body fat. Cardiovascular exercise helps improve heart health, metabolism, hormonal balance, blood sugar control, and recovery from fatigue. Resistance training improves strength, endurance, balance, coordination, reaction time, metabolism, and muscle mass. Flexibility exercise improves circulation, posture, and balance. Seek guidance from your physician and exercise physiologist before implementing an exercise routine and learn your capabilities and proper form for all exercise.  Introduction to Yoga  Clinical staff conducted group or individual video education with verbal and written material and guidebook.  Patient learns about yoga, a discipline of the coming together of mind, breath, and body. The benefits of yoga include improved flexibility, improved range of motion, better posture and core strength, increased lung function, weight loss, and positive self-image. Yoga's heart health benefits include lowered blood pressure, healthier heart rate, decreased cholesterol and triglyceride levels, improved immune function, and reduced stress. Seek guidance from your physician and exercise physiologist before implementing an exercise routine and learn your capabilities and proper form for all exercise.  Medical   Aging: Enhancing Your Quality of Life  Clinical staff conducted  group or individual video education with verbal and written material and guidebook.  Patient learns key strategies and recommendations to stay in good physical health and enhance quality of life, such as prevention strategies, having an advocate, securing a Health Care Proxy and Power of Attorney, and keeping a list of medications and system for tracking them. It also discusses how to avoid risk for bone loss.  Biology of Weight Control  Clinical staff conducted group or individual video education with verbal and written material and guidebook.  Patient learns that weight gain occurs because we consume more calories than we burn (eating more, moving less). Even if your body weight is normal, you may have higher ratios of fat compared to muscle mass. Too much body fat puts you at increased risk for cardiovascular disease, heart attack, stroke, type 2 diabetes, and obesity-related cancers. In addition to exercise, following the Pritikin Eating Plan can help reduce your risk.  Decoding Lab  Results  Clinical staff conducted group or individual video education with verbal and written material and guidebook.  Patient learns that lab test reflects one measurement whose values change over time and are influenced by many factors, including medication, stress, sleep, exercise, food, hydration, pre-existing medical conditions, and more. It is recommended to use the knowledge from this video to become more involved with your lab results and evaluate your numbers to speak with your doctor.   Diseases of Our Time - Overview  Clinical staff conducted group or individual video education with verbal and written material and guidebook.  Patient learns that according to the CDC, 50% to 70% of chronic diseases (such as obesity, type 2 diabetes, elevated lipids, hypertension, and heart disease) are avoidable through lifestyle improvements including healthier food choices, listening to satiety cues, and increased physical  activity.  Sleep Disorders Clinical staff conducted group or individual video education with verbal and written material and guidebook.  Patient learns how good quality and duration of sleep are important to overall health and well-being. Patient also learns about sleep disorders and how they impact health along with recommendations to address them, including discussing with a physician.  Nutrition  Dining Out - Part 2 Clinical staff conducted group or individual video education with verbal and written material and guidebook.  Patient learns how to plan ahead and communicate in order to maximize their dining experience in a healthy and nutritious manner. Included are recommended food choices based on the type of restaurant the patient is visiting.   Fueling a Banker conducted group or individual video education with verbal and written material and guidebook.  There is a strong connection between our food choices and our health. Diseases like obesity and type 2 diabetes are very prevalent and are in large-part due to lifestyle choices. The Pritikin Eating Plan provides plenty of food and hunger-curbing satisfaction. It is easy to follow, affordable, and helps reduce health risks.  Menu Workshop  Clinical staff conducted group or individual video education with verbal and written material and guidebook.  Patient learns that restaurant meals can sabotage health goals because they are often packed with calories, fat, sodium, and sugar. Recommendations include strategies to plan ahead and to communicate with the manager, chef, or server to help order a healthier meal.  Planning Your Eating Strategy  Clinical staff conducted group or individual video education with verbal and written material and guidebook.  Patient learns about the Pritikin Eating Plan and its benefit of reducing the risk of disease. The Pritikin Eating Plan does not focus on calories. Instead, it emphasizes  high-quality, nutrient-rich foods. By knowing the characteristics of the foods, we choose, we can determine their calorie density and make informed decisions.  Targeting Your Nutrition Priorities  Clinical staff conducted group or individual video education with verbal and written material and guidebook.  Patient learns that lifestyle habits have a tremendous impact on disease risk and progression. This video provides eating and physical activity recommendations based on your personal health goals, such as reducing LDL cholesterol, losing weight, preventing or controlling type 2 diabetes, and reducing high blood pressure.  Vitamins and Minerals  Clinical staff conducted group or individual video education with verbal and written material and guidebook.  Patient learns different ways to obtain key vitamins and minerals, including through a recommended healthy diet. It is important to discuss all supplements you take with your doctor.   Healthy Mind-Set    Smoking Cessation  Clinical staff conducted  group or individual video education with verbal and written material and guidebook.  Patient learns that cigarette smoking and tobacco addiction pose a serious health risk which affects millions of people. Stopping smoking will significantly reduce the risk of heart disease, lung disease, and many forms of cancer. Recommended strategies for quitting are covered, including working with your doctor to develop a successful plan.  Culinary   Becoming a Set Designer conducted group or individual video education with verbal and written material and guidebook.  Patient learns that cooking at home can be healthy, cost-effective, quick, and puts them in control. Keys to cooking healthy recipes will include looking at your recipe, assessing your equipment needs, planning ahead, making it simple, choosing cost-effective seasonal ingredients, and limiting the use of added fats, salts, and  sugars.  Cooking - Breakfast and Snacks  Clinical staff conducted group or individual video education with verbal and written material and guidebook.  Patient learns how important breakfast is to satiety and nutrition through the entire day. Recommendations include key foods to eat during breakfast to help stabilize blood sugar levels and to prevent overeating at meals later in the day. Planning ahead is also a key component.  Cooking - Educational Psychologist conducted group or individual video education with verbal and written material and guidebook.  Patient learns eating strategies to improve overall health, including an approach to cook more at home. Recommendations include thinking of animal protein as a side on your plate rather than center stage and focusing instead on lower calorie dense options like vegetables, fruits, whole grains, and plant-based proteins, such as beans. Making sauces in large quantities to freeze for later and leaving the skin on your vegetables are also recommended to maximize your experience.  Cooking - Healthy Salads and Dressing Clinical staff conducted group or individual video education with verbal and written material and guidebook.  Patient learns that vegetables, fruits, whole grains, and legumes are the foundations of the Pritikin Eating Plan. Recommendations include how to incorporate each of these in flavorful and healthy salads, and how to create homemade salad dressings. Proper handling of ingredients is also covered. Cooking - Soups and [employer] - Soups and Desserts Clinical staff conducted group or individual video education with verbal and written material and guidebook.  Patient learns that Pritikin soups and desserts make for easy, nutritious, and delicious snacks and meal components that are low in sodium, fat, sugar, and calorie density, while high in vitamins, minerals, and filling fiber. Recommendations include simple and healthy  ideas for soups and desserts.   Overview     The Pritikin Solution Program Overview Clinical staff conducted group or individual video education with verbal and written material and guidebook.  Patient learns that the results of the Pritikin Program have been documented in more than 100 articles published in peer-reviewed journals, and the benefits include reducing risk factors for (and, in some cases, even reversing) high cholesterol, high blood pressure, type 2 diabetes, obesity, and more! An overview of the three key pillars of the Pritikin Program will be covered: eating well, doing regular exercise, and having a healthy mind-set.  WORKSHOPS  Exercise: Exercise Basics: Building Your Action Plan Clinical staff led group instruction and group discussion with PowerPoint presentation and patient guidebook. To enhance the learning environment the use of posters, models and videos may be added. At the conclusion of this workshop, patients will comprehend the difference between physical activity and exercise, as well  as the benefits of incorporating both, into their routine. Patients will understand the FITT (Frequency, Intensity, Time, and Type) principle and how to use it to build an exercise action plan. In addition, safety concerns and other considerations for exercise and cardiac rehab will be addressed by the presenter. The purpose of this lesson is to promote a comprehensive and effective weekly exercise routine in order to improve patients' overall level of fitness.   Managing Heart Disease: Your Path to a Healthier Heart Clinical staff led group instruction and group discussion with PowerPoint presentation and patient guidebook. To enhance the learning environment the use of posters, models and videos may be added.At the conclusion of this workshop, patients will understand the anatomy and physiology of the heart. Additionally, they will understand how Pritikin's three pillars impact the  risk factors, the progression, and the management of heart disease.  The purpose of this lesson is to provide a high-level overview of the heart, heart disease, and how the Pritikin lifestyle positively impacts risk factors.  Exercise Biomechanics Clinical staff led group instruction and group discussion with PowerPoint presentation and patient guidebook. To enhance the learning environment the use of posters, models and videos may be added. Patients will learn how the structural parts of their bodies function and how these functions impact their daily activities, movement, and exercise. Patients will learn how to promote a neutral spine, learn how to manage pain, and identify ways to improve their physical movement in order to promote healthy living. The purpose of this lesson is to expose patients to common physical limitations that impact physical activity. Participants will learn practical ways to adapt and manage aches and pains, and to minimize their effect on regular exercise. Patients will learn how to maintain good posture while sitting, walking, and lifting.  Balance Training and Fall Prevention  Clinical staff led group instruction and group discussion with PowerPoint presentation and patient guidebook. To enhance the learning environment the use of posters, models and videos may be added. At the conclusion of this workshop, patients will understand the importance of their sensorimotor skills (vision, proprioception, and the vestibular system) in maintaining their ability to balance as they age. Patients will apply a variety of balancing exercises that are appropriate for their current level of function. Patients will understand the common causes for poor balance, possible solutions to these problems, and ways to modify their physical environment in order to minimize their fall risk. The purpose of this lesson is to teach patients about the importance of maintaining balance as they age  and ways to minimize their risk of falling.  WORKSHOPS   Nutrition:  Fueling a Ship Broker led group instruction and group discussion with PowerPoint presentation and patient guidebook. To enhance the learning environment the use of posters, models and videos may be added. Patients will review the foundational principles of the Pritikin Eating Plan and understand what constitutes a serving size in each of the food groups. Patients will also learn Pritikin-friendly foods that are better choices when away from home and review make-ahead meal and snack options. Calorie density will be reviewed and applied to three nutrition priorities: weight maintenance, weight loss, and weight gain. The purpose of this lesson is to reinforce (in a group setting) the key concepts around what patients are recommended to eat and how to apply these guidelines when away from home by planning and selecting Pritikin-friendly options. Patients will understand how calorie density may be adjusted for different weight management goals.  Mindful Eating  Clinical staff led group instruction and group discussion with PowerPoint presentation and patient guidebook. To enhance the learning environment the use of posters, models and videos may be added. Patients will briefly review the concepts of the Pritikin Eating Plan and the importance of low-calorie dense foods. The concept of mindful eating will be introduced as well as the importance of paying attention to internal hunger signals. Triggers for non-hunger eating and techniques for dealing with triggers will be explored. The purpose of this lesson is to provide patients with the opportunity to review the basic principles of the Pritikin Eating Plan, discuss the value of eating mindfully and how to measure internal cues of hunger and fullness using the Hunger Scale. Patients will also discuss reasons for non-hunger eating and learn strategies to use for controlling  emotional eating.  Targeting Your Nutrition Priorities Clinical staff led group instruction and group discussion with PowerPoint presentation and patient guidebook. To enhance the learning environment the use of posters, models and videos may be added. Patients will learn how to determine their genetic susceptibility to disease by reviewing their family history. Patients will gain insight into the importance of diet as part of an overall healthy lifestyle in mitigating the impact of genetics and other environmental insults. The purpose of this lesson is to provide patients with the opportunity to assess their personal nutrition priorities by looking at their family history, their own health history and current risk factors. Patients will also be able to discuss ways of prioritizing and modifying the Pritikin Eating Plan for their highest risk areas  Menu  Clinical staff led group instruction and group discussion with PowerPoint presentation and patient guidebook. To enhance the learning environment the use of posters, models and videos may be added. Using menus brought in from [employer], or printed from [employer], patients will apply the Pritikin dining out guidelines that were presented in the [employer] video. Patients will also be able to practice these guidelines in a variety of provided scenarios. The purpose of this lesson is to provide patients with the opportunity to practice hands-on learning of the Pritikin Dining Out guidelines with actual menus and practice scenarios.  Label Reading Clinical staff led group instruction and group discussion with PowerPoint presentation and patient guidebook. To enhance the learning environment the use of posters, models and videos may be added. Patients will review and discuss the Pritikin label reading guidelines presented in [employer]ik[employer]'s Label Reading Educational series video. Using fool labels brought in from local grocery stores  and markets, patients will apply the label reading guidelines and determine if the packaged food meet the Pritikin guidelines. The purpose of this lesson is to provide patients with the opportunity to review, discuss, and practice hands-on learning of the Pritikin Label Reading guidelines with actual packaged food labels. Cooking School  Pritikin's [employer] are designed to teach patients ways to prepare quick, simple, and affordable recipes at home. The importance of nutrition's role in chronic disease risk reduction is reflected in its emphasis in the overall Pritikin program. By learning how to prepare essential core Pritikin Eating Plan recipes, patients will increase control over what they eat; be able to customize the flavor of foods without the use of added salt, sugar, or fat; and improve the quality of the food they consume. By learning a set of core recipes which are easily assembled, quickly prepared, and affordable, patients are more likely to prepare more healthy foods at home. These  workshops focus on convenient breakfasts, simple entres, side dishes, and desserts which can be prepared with minimal effort and are consistent with nutrition recommendations for cardiovascular risk reduction. Cooking [employer] are taught by a armed forces logistics/support/administrative officer (RD) who has been trained by the [employer]. The chef or RD has a clear understanding of the importance of minimizing - if not completely eliminating - added fat, sugar, and sodium in recipes. Throughout the series of Cooking School Workshop sessions, patients will learn about healthy ingredients and efficient methods of cooking to build confidence in their capability to prepare    Cooking School weekly topics:  Adding Flavor- Sodium-Free  Fast and Healthy Breakfasts  Powerhouse Plant-Based Proteins  Satisfying Salads and Dressings  Simple Sides and Sauces  International Cuisine-Spotlight on the [employer]  Zones  Delicious Desserts  Savory Soups  [employer] - Meals in a Astronomer Appetizers and Snacks  Comforting Weekend Breakfasts  One-Pot Wonders   Fast Evening Meals  Landscape Architect Your Pritikin Plate  WORKSHOPS   Healthy Mindset (Psychosocial):  Focused Goals, Sustainable Changes Clinical staff led group instruction and group discussion with PowerPoint presentation and patient guidebook. To enhance the learning environment the use of posters, models and videos may be added. Patients will be able to apply effective goal setting strategies to establish at least one personal goal, and then take consistent, meaningful action toward that goal. They will learn to identify common barriers to achieving personal goals and develop strategies to overcome them. Patients will also gain an understanding of how our mind-set can impact our ability to achieve goals and the importance of cultivating a positive and growth-oriented mind-set. The purpose of this lesson is to provide patients with a deeper understanding of how to set and achieve personal goals, as well as the tools and strategies needed to overcome common obstacles which may arise along the way.  From Head to Heart: The Power of a Healthy Outlook  Clinical staff led group instruction and group discussion with PowerPoint presentation and patient guidebook. To enhance the learning environment the use of posters, models and videos may be added. Patients will be able to recognize and describe the impact of emotions and mood on physical health. They will discover the importance of self-care and explore self-care practices which may work for them. Patients will also learn how to utilize the 4 C's to cultivate a healthier outlook and better manage stress and challenges. The purpose of this lesson is to demonstrate to patients how a healthy outlook is an essential part of maintaining good health, especially as they continue their  cardiac rehab journey.  Healthy Sleep for a Healthy Heart Clinical staff led group instruction and group discussion with PowerPoint presentation and patient guidebook. To enhance the learning environment the use of posters, models and videos may be added. At the conclusion of this workshop, patients will be able to demonstrate knowledge of the importance of sleep to overall health, well-being, and quality of life. They will understand the symptoms of, and treatments for, common sleep disorders. Patients will also be able to identify daytime and nighttime behaviors which impact sleep, and they will be able to apply these tools to help manage sleep-related challenges. The purpose of this lesson is to provide patients with a general overview of sleep and outline the importance of quality sleep. Patients will learn about a few of the most common sleep disorders. Patients will also be introduced to the concept  of "sleep hygiene," and discover ways to self-manage certain sleeping problems through simple daily behavior changes. Finally, the workshop will motivate patients by clarifying the links between quality sleep and their goals of heart-healthy living.   Recognizing and Reducing Stress Clinical staff led group instruction and group discussion with PowerPoint presentation and patient guidebook. To enhance the learning environment the use of posters, models and videos may be added. At the conclusion of this workshop, patients will be able to understand the types of stress reactions, differentiate between acute and chronic stress, and recognize the impact that chronic stress has on their health. They will also be able to apply different coping mechanisms, such as reframing negative self-talk. Patients will have the opportunity to practice a variety of stress management techniques, such as deep abdominal breathing, progressive muscle relaxation, and/or guided imagery.  The purpose of this lesson is to educate  patients on the role of stress in their lives and to provide healthy techniques for coping with it.  Learning Barriers/Preferences:  Learning Barriers/Preferences - 10/18/23 1305       Learning Barriers/Preferences   Learning Barriers Sight;Hearing    Learning Preferences Skilled Demonstration;Individual Instruction;Pictoral;Group Instruction          Education Topics:  Knowledge Questionnaire Score:  Knowledge Questionnaire Score - 10/18/23 1306       Knowledge Questionnaire Score   Pre Score 23/24          Core Components/Risk Factors/Patient Goals at Admission:  Personal Goals and Risk Factors at Admission - 10/18/23 1427       Core Components/Risk Factors/Patient Goals on Admission   Diabetes Yes    Intervention Provide education about signs/symptoms and action to take for hypo/hyperglycemia.;Provide education about proper nutrition, including hydration, and aerobic/resistive exercise prescription along with prescribed medications to achieve blood glucose in normal ranges: Fasting glucose 65-99 mg/dL    Expected Outcomes Short Term: Participant verbalizes understanding of the signs/symptoms and immediate care of hyper/hypoglycemia, proper foot care and importance of medication, aerobic/resistive exercise and nutrition plan for blood glucose control.;Long Term: Attainment of HbA1C < 7%.    Heart Failure Yes    Intervention Provide a combined exercise and nutrition program that is supplemented with education, support and counseling about heart failure. Directed toward relieving symptoms such as shortness of breath, decreased exercise tolerance, and extremity edema.    Expected Outcomes Improve functional capacity of life;Short term: Attendance in program 2-3 days a week with increased exercise capacity. Reported lower sodium intake. Reported increased fruit and vegetable intake. Reports medication compliance.;Short term: Daily weights obtained and reported for increase.  Utilizing diuretic protocols set by physician.;Long term: Adoption of self-care skills and reduction of barriers for early signs and symptoms recognition and intervention leading to self-care maintenance.    Hypertension Yes    Intervention Provide education on lifestyle modifcations including regular physical activity/exercise, weight management, moderate sodium restriction and increased consumption of fresh fruit, vegetables, and low fat dairy, alcohol moderation, and smoking cessation.;Monitor prescription use compliance.    Expected Outcomes Short Term: Continued assessment and intervention until BP is < 140/42mm HG in hypertensive participants. < 130/8mm HG in hypertensive participants with diabetes, heart failure or chronic kidney disease.;Long Term: Maintenance of blood pressure at goal levels.    Lipids Yes    Intervention Provide education and support for participant on nutrition & aerobic/resistive exercise along with prescribed medications to achieve LDL 70mg , HDL >40mg .    Expected Outcomes Short Term: Participant states understanding of desired cholesterol  values and is compliant with medications prescribed. Participant is following exercise prescription and nutrition guidelines.;Long Term: Cholesterol controlled with medications as prescribed, with individualized exercise RX and with personalized nutrition plan. Value goals: LDL < 70mg , HDL > 40 mg.    Stress Yes    Intervention Offer individual and/or small group education and counseling on adjustment to heart disease, stress management and health-related lifestyle change. Teach and support self-help strategies.;Refer participants experiencing significant psychosocial distress to appropriate mental health specialists for further evaluation and treatment. When possible, include family members and significant others in education/counseling sessions.    Expected Outcomes Short Term: Participant demonstrates changes in health-related behavior,  relaxation and other stress management skills, ability to obtain effective social support, and compliance with psychotropic medications if prescribed.;Long Term: Emotional wellbeing is indicated by absence of clinically significant psychosocial distress or social isolation.          Core Components/Risk Factors/Patient Goals Review:   Goals and Risk Factor Review     Row Name 10/31/23 1139 11/22/23 1436 12/23/23 0843         Core Components/Risk Factors/Patient Goals Review   Personal Goals Review Diabetes;Heart Failure;Hypertension;Lipids;Stress Diabetes;Heart Failure;Hypertension;Lipids;Stress Diabetes;Heart Failure;Hypertension;Lipids;Stress     Review Jose Koch is doing well with exercise at cardiac rehab. Vital signs have been stable. Jose Koch has increased his met levels on the recumbent bike and nustep. Jose Koch is doing well with exercise at cardiac rehab. Vital signs have been stable. Jose Koch has increased his met levels on the recumbent bike and nustep and has lost 3.9kg since 10/26/23 start. Jose Koch is doing well with exercise at cardiac rehab. VSS. Jose Koch has maintained his met levels over the last 30 days.     Expected Outcomes Jose Koch will continue to participate in cardiac rehab for exercise, nutrtion and lifestyle modifications. Jose Koch will continue to participate in cardiac rehab for exercise, nutrtion and lifestyle modifications. Jose Koch will continue to participate in cardiac rehab for exercise, nutrition and lifestyle modifications.        Core Components/Risk Factors/Patient Goals at Discharge (Final Review):   Goals and Risk Factor Review - 12/23/23 0843       Core Components/Risk Factors/Patient Goals Review   Personal Goals Review Diabetes;Heart Failure;Hypertension;Lipids;Stress    Review Jose Koch is doing well with exercise at cardiac rehab. VSS. Jose Koch has maintained his met levels over the last 30 days.    Expected Outcomes Jose Koch will continue to participate in cardiac rehab  for exercise, nutrition and lifestyle modifications.          ITP Comments:  ITP Comments     Row Name 10/18/23 1430 10/28/23 1435 11/22/23 1433 12/23/23 0842     ITP Comments Jose Bihari, MD: Medical director. Intorduction to the [employer] Initial orientation packet reviewed with the patient. 30 Day ITP Review. Jose Koch started cardiac rehab on 10/26/23. Jose Koch is off to a good start to exercise. 30 Day ITP Review. Jose Koch is doing well with exercise at cardiac rehab. 30 Day ITP Review. Jose Koch is doing well with exercise at cardiac rehab when in attendance.       Comments: see ITP comments

## 2023-12-28 ENCOUNTER — Encounter (HOSPITAL_COMMUNITY)
Admission: RE | Admit: 2023-12-28 | Discharge: 2023-12-28 | Disposition: A | Source: Ambulatory Visit | Attending: Internal Medicine

## 2023-12-28 DIAGNOSIS — I5022 Chronic systolic (congestive) heart failure: Secondary | ICD-10-CM

## 2023-12-29 ENCOUNTER — Other Ambulatory Visit (HOSPITAL_BASED_OUTPATIENT_CLINIC_OR_DEPARTMENT_OTHER): Payer: Self-pay

## 2023-12-30 ENCOUNTER — Other Ambulatory Visit (HOSPITAL_BASED_OUTPATIENT_CLINIC_OR_DEPARTMENT_OTHER): Payer: Self-pay

## 2023-12-30 ENCOUNTER — Encounter (HOSPITAL_COMMUNITY): Admission: RE | Admit: 2023-12-30 | Source: Ambulatory Visit

## 2024-01-02 ENCOUNTER — Telehealth: Payer: Self-pay | Admitting: Neuroradiology

## 2024-01-02 ENCOUNTER — Encounter (HOSPITAL_COMMUNITY)

## 2024-01-02 ENCOUNTER — Encounter (INDEPENDENT_AMBULATORY_CARE_PROVIDER_SITE_OTHER): Payer: Self-pay | Admitting: Ophthalmology

## 2024-01-02 ENCOUNTER — Ambulatory Visit (INDEPENDENT_AMBULATORY_CARE_PROVIDER_SITE_OTHER): Admitting: Ophthalmology

## 2024-01-02 DIAGNOSIS — Z7984 Long term (current) use of oral hypoglycemic drugs: Secondary | ICD-10-CM | POA: Diagnosis not present

## 2024-01-02 DIAGNOSIS — Z794 Long term (current) use of insulin: Secondary | ICD-10-CM | POA: Diagnosis not present

## 2024-01-02 DIAGNOSIS — E113513 Type 2 diabetes mellitus with proliferative diabetic retinopathy with macular edema, bilateral: Secondary | ICD-10-CM | POA: Diagnosis not present

## 2024-01-02 DIAGNOSIS — E1169 Type 2 diabetes mellitus with other specified complication: Secondary | ICD-10-CM

## 2024-01-02 DIAGNOSIS — I10 Essential (primary) hypertension: Secondary | ICD-10-CM

## 2024-01-02 DIAGNOSIS — H35033 Hypertensive retinopathy, bilateral: Secondary | ICD-10-CM

## 2024-01-02 MED ORDER — FARICIMAB-SVOA 6 MG/0.05ML IZ SOSY
6.0000 mg | PREFILLED_SYRINGE | INTRAVITREAL | Status: AC | PRN
  Administered 2024-01-02: 6 mg via INTRAVITREAL

## 2024-01-02 MED ORDER — BEVACIZUMAB CHEMO INJECTION 1.25MG/0.05ML SYRINGE FOR KALEIDOSCOPE: 1.2500 mg | INTRAVITREAL | Status: DC | PRN

## 2024-01-02 NOTE — Telephone Encounter (Signed)
 Left VM for patient to call back to reschedule appt to Monday 11/17 or the following week 11/26.

## 2024-01-03 ENCOUNTER — Other Ambulatory Visit (HOSPITAL_BASED_OUTPATIENT_CLINIC_OR_DEPARTMENT_OTHER): Payer: Self-pay

## 2024-01-04 ENCOUNTER — Encounter (HOSPITAL_COMMUNITY)

## 2024-01-05 ENCOUNTER — Ambulatory Visit: Payer: Self-pay | Admitting: Gastroenterology

## 2024-01-06 ENCOUNTER — Telehealth (HOSPITAL_COMMUNITY): Payer: Self-pay

## 2024-01-06 ENCOUNTER — Encounter (HOSPITAL_COMMUNITY)

## 2024-01-06 ENCOUNTER — Other Ambulatory Visit (HOSPITAL_BASED_OUTPATIENT_CLINIC_OR_DEPARTMENT_OTHER): Payer: Self-pay

## 2024-01-06 NOTE — Telephone Encounter (Signed)
 Pt's significant other is currently hospitalized at [facility] since approximately 12/30/23.  He has been unable to attend cardiac rehab as his partner's mother has been there at the hospital visiting her and unable to transport him to [facility].

## 2024-01-09 ENCOUNTER — Encounter (HOSPITAL_COMMUNITY)
Admission: RE | Admit: 2024-01-09 | Discharge: 2024-01-09 | Disposition: A | Discharge status: Home / Self Care | Source: Ambulatory Visit | Attending: Internal Medicine | Admitting: Internal Medicine

## 2024-01-09 ENCOUNTER — Other Ambulatory Visit (HOSPITAL_BASED_OUTPATIENT_CLINIC_OR_DEPARTMENT_OTHER): Payer: Self-pay

## 2024-01-09 DIAGNOSIS — I5022 Chronic systolic (congestive) heart failure: Secondary | ICD-10-CM | POA: Diagnosis not present

## 2024-01-09 MED ORDER — JARDIANCE 25 MG PO TABS
25.0000 mg | ORAL_TABLET | Freq: Every day | ORAL | 1 refills | Status: AC
  Filled 2024-01-09 – 2024-02-24 (×3): qty 90, 90d supply, fill #0

## 2024-01-11 ENCOUNTER — Encounter (HOSPITAL_COMMUNITY)
Admission: RE | Admit: 2024-01-11 | Discharge: 2024-01-11 | Disposition: A | Source: Ambulatory Visit | Attending: Internal Medicine

## 2024-01-11 VITALS — Ht 69.75 in | Wt 164.2 lb

## 2024-01-11 DIAGNOSIS — I5022 Chronic systolic (congestive) heart failure: Secondary | ICD-10-CM

## 2024-01-12 ENCOUNTER — Ambulatory Visit: Admitting: Neuroradiology

## 2024-01-13 ENCOUNTER — Encounter (HOSPITAL_COMMUNITY)
Admission: RE | Admit: 2024-01-13 | Discharge: 2024-01-13 | Disposition: A | Discharge status: Home / Self Care | Source: Ambulatory Visit | Attending: Internal Medicine | Admitting: Internal Medicine

## 2024-01-13 DIAGNOSIS — I5022 Chronic systolic (congestive) heart failure: Secondary | ICD-10-CM

## 2024-01-13 NOTE — Progress Notes (Signed)
 Reviewed home exercise Rx with patient today.  Encouraged warm-up, cool-down, and stretching. Reviewed THRR of 68-135 and keeping RPE between 11-13. Encouraged to hydrate with activity.  Reviewed weather parameters for temperature and humidity for safe exercise outdoors. Reviewed S/S to terminate exercise and when to call 911 vs MD. Reviewed the use of NTG and pt was encouraged to carry at all times. Pt encouraged to always carry a cell phone for safety when exercising outdoors. Pt verbalized understanding of the home exercise Rx and was provided a copy.   Alm Parkins MS, ACSM-CEP, CCRP

## 2024-01-13 NOTE — Progress Notes (Signed)
 Discharge Progress Report  Patient Details  Name: Jose Koch MRN: [id number] Date of Birth: 07-May-1970 Referring Provider:   Flowsheet Row INTENSIVE CARDIAC REHAB ORIENT from 10/18/2023 in [facility]  Referring Provider Diannah Maywood, MD     Number of Visits: 43  Reason for Discharge:  Patient reached a stable level of exercise. Patient independent in their exercise. Patient has met program and personal goals.  Smoking History:  Social History   Tobacco Use  Smoking Status Never  Smokeless Tobacco Never    Diagnosis:  Heart failure, chronic systolic (HCC)  ADL [facility]:   Initial Exercise Prescription:  Initial Exercise Prescription - 10/18/23 1200       Date of Initial Exercise RX and Referring Provider   Date 10/18/23    Referring Provider Vishnu Mallipeddi, MD    Expected Discharge Date 01/13/24      Recumbant Bike   Level 1    RPM 60    Watts 36    Minutes 15    METs 3.6      T5 Nustep   Level 1    SPM 75    Minutes 15    METs 3.6      Prescription Details   Frequency (times per week) 3    Duration Progress to 30 minutes of continuous aerobic without signs/symptoms of physical distress      Intensity   THRR 40-80% of Max Heartrate 68-135    Ratings of Perceived Exertion 11-13    Perceived Dyspnea 0-4      Progression   Progression Continue progressive overload as per policy without signs/symptoms or physical distress.      Resistance Training   Training Prescription Yes    Weight 3lbs    Reps 10-15          Discharge Exercise Prescription (Final Exercise Prescription Changes):  Exercise Prescription Changes - 01/13/24 1400       Response to Exercise   Blood Pressure (Admit) 138/78 (P)     Blood Pressure (Exit) 110/70 (P)     Heart Rate (Admit) 78 bpm (P)     Heart Rate (Exercise) 101 bpm (P)     Heart Rate (Exit) 82 bpm (P)     Rating of Perceived Exertion (Exercise)  10 (P)     Symptoms none (P)           Functional Capacity:  6 Minute Walk     Row Name 10/18/23 1243 01/11/24 1305       6 Minute Walk   Phase Initial Discharge    Distance 1080 feet 1200 feet    Distance % Change -- 11.1 %    Distance Feet Change -- 120 ft    Walk Time 6 minutes 120 minutes    # of Rest Breaks 1  5:52-6:00 due to ankle pain 0    MPH 2 2.3    METS 3.6 3.95    RPE 11 12    Perceived Dyspnea  0 0    VO2 Peak 12.5 13.8    Symptoms Yes (comment) Yes (comment)    Comments Chronic Left ankle/calf pain 6/10;  chronic rt calf pain 3/10 Chronic Left ankle/calf pain 6/10;  resolves with rest    Resting HR 95 bpm 86 bpm    Resting BP 114/80 110/68    Resting Oxygen Saturation  98 % --    Exercise Oxygen Saturation  during 6 min walk  99 % --    Max Ex. HR 108 bpm 100 bpm    Max Ex. BP 88/67  pt asymptomatic 118/62    2 Minute Post BP 115/75 --       Psychological, QOL, Others - Outcomes: PHQ 2/9:    01/13/2024    2:00 PM 10/18/2023   11:24 AM 05/26/2020    3:43 PM 05/12/2020    1:18 PM  Depression screen PHQ 2/9  Decreased Interest 1 0 0 0  Down, Depressed, Hopeless 0 0 0 0  PHQ - 2 Score 1 0 0 0  Altered sleeping 1 1    Tired, decreased energy 2 1    Change in appetite 0 0    Feeling bad or failure about yourself  0 0    Trouble concentrating 1 0    Moving slowly or fidgety/restless 0 0    Suicidal thoughts 0 0    PHQ-9 Score 5 2     Difficult doing work/chores  Not difficult at all       Data saved with a previous flowsheet row definition    Quality of Life:  Quality of Life - 10/18/23 1123       Quality of Life   Select Quality of Life      Quality of Life Scores   Health/Function Pre 23.4 %    Socioeconomic Pre 27.14 %    Psych/Spiritual Pre 26.14 %    Family Pre 26.4 %    GLOBAL Pre 25.18 %          Personal Goals: Goals established at orientation with interventions provided to work toward goal.  Personal Goals and Risk Factors  at Admission - 10/18/23 1427       Core Components/Risk Factors/Patient Goals on Admission   Diabetes Yes    Intervention Provide education about signs/symptoms and action to take for hypo/hyperglycemia.;Provide education about proper nutrition, including hydration, and aerobic/resistive exercise prescription along with prescribed medications to achieve blood glucose in normal ranges: Fasting glucose 65-99 mg/dL    Expected Outcomes Short Term: Participant verbalizes understanding of the signs/symptoms and immediate care of hyper/hypoglycemia, proper foot care and importance of medication, aerobic/resistive exercise and nutrition plan for blood glucose control.;Long Term: Attainment of HbA1C < 7%.    Heart Failure Yes    Intervention Provide a combined exercise and nutrition program that is supplemented with education, support and counseling about heart failure. Directed toward relieving symptoms such as shortness of breath, decreased exercise tolerance, and extremity edema.    Expected Outcomes Improve functional capacity of life;Short term: Attendance in program 2-3 days a week with increased exercise capacity. Reported lower sodium intake. Reported increased fruit and vegetable intake. Reports medication compliance.;Short term: Daily weights obtained and reported for increase. Utilizing diuretic protocols set by physician.;Long term: Adoption of self-care skills and reduction of barriers for early signs and symptoms recognition and intervention leading to self-care maintenance.    Hypertension Yes    Intervention Provide education on lifestyle modifcations including regular physical activity/exercise, weight management, moderate sodium restriction and increased consumption of fresh fruit, vegetables, and low fat dairy, alcohol moderation, and smoking cessation.;Monitor prescription use compliance.    Expected Outcomes Short Term: Continued assessment and intervention until BP is < 140/83mm HG in  hypertensive participants. < 130/34mm HG in hypertensive participants with diabetes, heart failure or chronic kidney disease.;Long Term: Maintenance of blood pressure at goal levels.    Lipids Yes    Intervention Provide education and  support for participant on nutrition & aerobic/resistive exercise along with prescribed medications to achieve LDL 70mg , HDL >40mg .    Expected Outcomes Short Term: Participant states understanding of desired cholesterol values and is compliant with medications prescribed. Participant is following exercise prescription and nutrition guidelines.;Long Term: Cholesterol controlled with medications as prescribed, with individualized exercise RX and with personalized nutrition plan. Value goals: LDL < 70mg , HDL > 40 mg.    Stress Yes    Intervention Offer individual and/or small group education and counseling on adjustment to heart disease, stress management and health-related lifestyle change. Teach and support self-help strategies.;Refer participants experiencing significant psychosocial distress to appropriate mental health specialists for further evaluation and treatment. When possible, include family members and significant others in education/counseling sessions.    Expected Outcomes Short Term: Participant demonstrates changes in health-related behavior, relaxation and other stress management skills, ability to obtain effective social support, and compliance with psychotropic medications if prescribed.;Long Term: Emotional wellbeing is indicated by absence of clinically significant psychosocial distress or social isolation.           Personal Goals Discharge:  Goals and Risk Factor Review     Row Name 10/31/23 1139 11/22/23 1436 12/23/23 0843         Core Components/Risk Factors/Patient Goals Review   Personal Goals Review Diabetes;Heart Failure;Hypertension;Lipids;Stress Diabetes;Heart Failure;Hypertension;Lipids;Stress Diabetes;Heart  Failure;Hypertension;Lipids;Stress     Review Antavion is doing well with exercise at cardiac rehab. Vital signs have been stable. Aerion has increased his met levels on the recumbent bike and nustep. Terez is doing well with exercise at cardiac rehab. Vital signs have been stable. Arless has increased his met levels on the recumbent bike and nustep and has lost 3.9kg since 10/26/23 start. Quaid is doing well with exercise at cardiac rehab. VSS. Landry has maintained his met levels over the last 30 days.     Expected Outcomes Kabeer will continue to participate in cardiac rehab for exercise, nutrtion and lifestyle modifications. Jewel will continue to participate in cardiac rehab for exercise, nutrtion and lifestyle modifications. Ikechukwu will continue to participate in cardiac rehab for exercise, nutrition and lifestyle modifications.        Exercise Goals and Review:  Exercise Goals     Row Name 10/18/23 1039             Exercise Goals   Increase Physical Activity Yes       Intervention Provide advice, education, support and counseling about physical activity/exercise needs.;Develop an individualized exercise prescription for aerobic and resistive training based on initial evaluation findings, risk stratification, comorbidities and participant's personal goals.       Expected Outcomes Short Term: Attend rehab on a regular basis to increase amount of physical activity.;Long Term: Add in home exercise to make exercise part of routine and to increase amount of physical activity.;Long Term: Exercising regularly at least 3-5 days a week.       Increase Strength and Stamina Yes       Intervention Provide advice, education, support and counseling about physical activity/exercise needs.;Develop an individualized exercise prescription for aerobic and resistive training based on initial evaluation findings, risk stratification, comorbidities and participant's personal goals.       Expected Outcomes Short  Term: Increase workloads from initial exercise prescription for resistance, speed, and METs.;Short Term: Perform resistance training exercises routinely during rehab and add in resistance training at home;Long Term: Improve cardiorespiratory fitness, muscular endurance and strength as measured by increased METs and functional capacity ( )  Able to understand and use rate of perceived exertion (RPE) scale Yes       Intervention Provide education and explanation on how to use RPE scale       Expected Outcomes Short Term: Able to use RPE daily in rehab to express subjective intensity level;Long Term:  Able to use RPE to guide intensity level when exercising independently       Knowledge and understanding of Target Heart Rate Range (THRR) Yes       Intervention Provide education and explanation of THRR including how the numbers were predicted and where they are located for reference       Expected Outcomes Short Term: Able to state/look up THRR;Short Term: Able to use daily as guideline for intensity in rehab;Long Term: Able to use THRR to govern intensity when exercising independently       Understanding of Exercise Prescription Yes       Intervention Provide education, explanation, and written materials on patient's individual exercise prescription       Expected Outcomes Short Term: Able to explain program exercise prescription;Long Term: Able to explain home exercise prescription to exercise independently          Exercise Goals Re-Evaluation:  Exercise Goals Re-Evaluation     Row Name 10/26/23 1627 11/23/23 1634 12/26/23 1602         Exercise Goal Re-Evaluation   Exercise Goals Review Increase Physical Activity;Understanding of Exercise Prescription;Increase Strength and Stamina;Knowledge and understanding of Target Heart Rate Range (THRR);Able to understand and use rate of perceived exertion (RPE) scale Increase Physical Activity;Understanding of Exercise Prescription;Increase Strength  and Stamina;Knowledge and understanding of Target Heart Rate Range (THRR);Able to understand and use rate of perceived exertion (RPE) scale Increase Physical Activity;Increase Strength and Stamina;Able to understand and use rate of perceived exertion (RPE) scale;Knowledge and understanding of Target Heart Rate Range (THRR);Understanding of Exercise Prescription     Comments Pt's first day in the [facility] program. Pt understandas the exercise Rx, RPE sclae and THRR. Reviewed METs and goals with patient today. Pt voices progress on his goal of improved strength and stamina. Reviewed METs and goals with pt. Pt is very limited by chronic ankle pain, which limits MET progression on both modalities. He is tolerating an avg MET level of 2.4 without s/sx. He feels he is making progress towards his goals, and he is able to get up off the floor without assistance now which he is happy about.     Expected Outcomes Will continue to monitor the patient and progress exercise workloads as tolerated. Will continue to monitor the patient and progress exercise workloads as tolerated. Will continue to progress workloads as tolerated without s/sx.        Nutrition & Weight - Outcomes:  Pre Biometrics - 10/18/23 1100       Pre Biometrics   Waist Circumference 37.5 inches    Hip Circumference 39 inches    Waist to Hip Ratio 0.96 %    Triceps Skinfold 14 mm    % Body Fat 23.9 %    Grip Strength 23 kg    Flexibility 13.5 in    Single Leg Stand 8 seconds          Post Biometrics - 01/11/24 1305        Post  Biometrics   Height 5' 9.75 (1.772 m)    Weight 74.5 kg    Waist Circumference 35.75 inches    Hip Circumference 39 inches    Waist  to Hip Ratio 0.92 %    BMI (Calculated) 23.73    Triceps Skinfold 6 mm    % Body Fat 19.7 %    Grip Strength 33 kg    Flexibility 13.5 in    Single Leg Stand 30 seconds          Nutrition:   Nutrition Discharge:   Education Questionnaire Score:  Knowledge  Questionnaire Score - 10/18/23 1306       Knowledge Questionnaire Score   Pre Score 23/24         Pt graduates from  Intensive/Traditional cardiac rehab program today with completion of 44 exercise and education sessions. Pt maintained good attendance and progressed nicely during their participation in rehab as evidenced by increased MET level.   Medication list reconciled. Repeat PHQ score-5, but pt declines needing depression interventions/treatment at this time.  Pt has made significant lifestyle changes and should be commended for his success.  Tommy achieved his goals during cardiac rehab.   Pt plans to continue exercise walking daily at home and at [facility] once his fiance is out of the hospital.   Goals reviewed with patient; copy given to patient.

## 2024-01-17 NOTE — Progress Notes (Shared)
 [facility] - Clinic Note  01/30/2024     CHIEF COMPLAINT Patient presents for No chief complaint on file.   HISTORY OF PRESENT ILLNESS: Jose Koch is a 53 y.o. male who presents to the clinic today for:     Pt states he's doing ok, girlfriend is in the hospital due to kidney issues. 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 Nanetta Sharps LEE:  Ocular Hx- PMH-    CURRENT MEDICATIONS: No current outpatient medications on file. (Ophthalmic Drugs)   No current facility-administered medications for this visit. (Ophthalmic Drugs)   Current Outpatient Medications (Other)  Medication Sig   aspirin  EC 81 MG tablet Take 81 mg by mouth daily. Swallow whole.   atorvastatin  (LIPITOR) 40 MG tablet Take 1 tablet (40 mg total) by mouth daily.   blood glucose meter kit and supplies KIT Dispense based on patient and insurance preference (something with affordable test strips). Use to check glucose twice daily as directed.   empagliflozin  (JARDIANCE ) 25 MG TABS tablet Take 1 tablet (25 mg total) by mouth daily.   Evolocumab  (REPATHA  SURECLICK) 140 MG/ML SOAJ Inject 140 mg into the skin every 14 (fourteen) days.   fenofibrate  160 MG tablet Take 1 tablet (160 mg total) by mouth daily.   ferrous sulfate 325 (65 FE) MG tablet Take by mouth daily.   folic acid  (FOLVITE ) 1 MG tablet TAKE 1 TABLET BY MOUTH EVERY DAY   glucose blood (ACCU-CHEK GUIDE) test strip Use as instructed to monitor glucose twice daily, before breakfast and before bed   icosapent  Ethyl (VASCEPA ) 1 g capsule Take 2 capsules (2 g total) by mouth 2 (two) times daily.   Insulin  Glargine (BASAGLAR  KWIKPEN) 100 UNIT/ML Inject 30 Units into the skin at bedtime.   Insulin  Pen  Needle 31G X 5 MM MISC 1 Units by Does not apply route daily.   JARDIANCE  25 MG TABS tablet Take 25 mg by mouth daily.   metoprolol  succinate (TOPROL  XL) 25 MG 24 hr tablet Take 0.5 tablets (12.5 mg total) by mouth daily.   Misc Natural Products (OSTEO BI-FLEX ADV DOUBLE ST PO) Take 1 tablet by mouth.   nitroGLYCERIN  (NITROSTAT ) 0.4 MG SL tablet Place 1 tablet (0.4 mg total) under the tongue every 5 (five) minutes x 3 doses as needed (if no relief after 2nd dose, proceed to the ED or call 911).   OneTouch Delica Lancets 33G MISC PLEASE SEE ATTACHED FOR DETAILED DIRECTIONS   pantoprazole  (PROTONIX ) 40 MG tablet TAKE 1 TABLET BY MOUTH 2 TIMES DAILY BEFORE A MEAL 30 MINUTES BEFORE BREAKFAST AND DINNER (Patient taking differently: 40 mg daily.)   polyethylene glycol-electrolytes (NULYTELY) 420 g solution Take 4,000 mLs by mouth once. (Patient not taking: Reported on 01/13/2024)   sitaGLIPtin  (JANUVIA ) 100 MG tablet Take 1 tablet (100 mg total) by mouth daily.   sodium bicarbonate  650 MG tablet Take 2 tablets (1,300 mg total) by mouth 2 (two) times daily, in the morning and evening. (Patient not taking: Reported on 12/16/2023)   ticagrelor  (BRILINTA ) 90 MG TABS tablet Take 1 tablet (90 mg total) by mouth 2 (two) times daily.   No current facility-administered medications for this visit. (Other)   REVIEW OF  SYSTEMS:        ALLERGIES Allergies  Allergen Reactions   Reglan  [Metoclopramide ] Other (See Comments)    Tremors, parkinson like symptoms    PAST MEDICAL HISTORY Past Medical History:  Diagnosis Date   CHF (congestive heart failure) (HCC)    COVID    COVID 02/2020   very sick - admitted to the hospital   Diabetes mellitus without complication (HCC)    Diabetic retinopathy (HCC)    Diabetic retinopathy (HCC)    GERD (gastroesophageal reflux disease)    Headache    High cholesterol    History of kidney stones    Hypertension    was diagnosed 2 years ago, never treated and  states BP is always - documented 07/13/21   Ichthyosis vulgaris    Macular edema    Pneumonia    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   Past Surgical History:  Procedure Laterality Date   CARDIAC CATHETERIZATION     IR ANGIO INTRA EXTRACRAN SEL COM CAROTID INNOMINATE BILAT MOD SED  04/17/2021   IR ANGIO INTRA EXTRACRAN SEL INTERNAL CAROTID UNI R MOD SED  07/17/2021   IR ANGIO VERTEBRAL SEL VERTEBRAL BILAT MOD SED  04/17/2021   IR CT HEAD LTD  07/15/2021   IR INTRA CRAN STENT  07/15/2021   IR RADIOLOGIST EVAL & MGMT  03/05/2021   IR RADIOLOGIST EVAL & MGMT  05/04/2021   IR RADIOLOGIST EVAL & MGMT  07/31/2021   IR US  GUIDE VASC ACCESS RIGHT  04/17/2021   IR US  GUIDE VASC ACCESS RIGHT  07/15/2021   RADIOLOGY WITH ANESTHESIA N/A 07/15/2021   Procedure: STENTING;  Surgeon: Dolphus Carrion, MD;  Location: [facility] OR;  Service: Radiology;  Laterality: N/A;   TONSILLECTOMY     wisdom teeth removal     FAMILY HISTORY Family History  Problem Relation Age of Onset   Bursitis Mother    Bladder Cancer Mother 60       smoker   SOCIAL HISTORY Social History   Tobacco Use   Smoking status: Never   Smokeless tobacco: Never  Vaping Use   Vaping status: Never Used  Substance Use Topics   Alcohol use: Not Currently   Drug use: Never       OPHTHALMIC EXAM:  Not recorded     IMAGING AND PROCEDURES  Imaging and Procedures for 01/30/2024           ASSESSMENT/PLAN:    ICD-10-CM   1. Proliferative diabetic retinopathy of both eyes with macular edema associated with type 2 diabetes mellitus (HCC)  Z88.6486     2. Current use of insulin  (HCC)  Z79.4     3. Long term (current) use of oral hypoglycemic drugs  Z79.84     4. Hyperlipidemia associated with type 2 diabetes mellitus (HCC)  E11.69    E78.5     5. Essential hypertension  I10     6. Hypertensive retinopathy of both eyes  H35.033       1-3. Proliferative diabetic retinopathy, both eyes  - A1c 9-pt reported  on 08.18.25, 7.4 on 08.11.23  - s/p IVA OS #1 (02.22.24), #2 (03.22.24), #3 (04.19.24)  -s/p IVA OD #1 (02.23.24), #2 (03.22.24) #3 (04.19.24), #4 (05.17.24), #5 (06.18.24), #6 (11.08.24)  -- IVA resistance OU ================= - s/p IVE OD #1 (06.18.24), #2 (07.16.24), #3 (08.13.24), #4 (09.10.24), #5 (10.08.24), #6 (11.08.24), #7 (12.06.24), #8 (01.03.25), #9 (01.31.25), #10 (02.28.25), #11 (03.28.25) - s/p IVE OS #1 (  05.17.24), #2 (06.18.24), #3 (7.16.24), #4 (08.13.24), #5 (09.10.24), #6 (10.08.24), #7 (11.08.24), #8 (12.06.24), #9 (01.03.25), #10 (01.31.25), #11 (02.28.25), #12 (03.28.25)  -- IVE resistance OU ================= - IVV OU #1 (04.25.25), #2 (05.23.25), #3 (06.23.25), #4 (07.21.25), #5 (08.18.25), #6 (09.15.25), #7 (10.13.25), #8 (11.10.25) - exam shows severe edema with scattered MA and DBH, but significant lipid component to edema OU - FA (02.21.24) shows OD: Large cluster of leaking NV along IT arcades, scattered patches of vascular non-perfusion; OS: +NVD and NVE along proximal arcades - repeat FA (10.13.25) shows OD: Interval improvement in large cluster of leaking NV along IT arcades, scattered patches of vascular non-perfusion; OS: Interval resolution of +NVD and NVE along proximal arcades - BCVA OD 20/50 from 20/60; OS 20/150 - stable - OCT shows OD: Persistent IRF, IRHM and SRHM greatest IN macula--slightly improved; OS: Persistent edema, IRHM and SRHM, +ERM with pucker at 4 wks - recommend IVV today OU #9 (12.08.25) w/ f/u in 4 wks - pt wishes to proceed - RBA of procedure discussed, questions answered - IVV informed consent obtained and signed, 04.25.25 (OU) - see procedure note - Eylea  is approved for 2025, Eylea4u approved - Vabysmo  is approved for 2025, co-pay program approved - f/u 4 weeks -- DFE/OCT, possible injection(s)  4. Hyperlipidemia  - review of labs shows severely elevated cholesterol and lipids  - 08.11.23:  Trig  785 Chol  598  LDL  410 -  01.17.25: Trig 335   Chol 279   LDL 173 - likely contributing to severe edema and exudate  5,6. Hypertensive retinopathy OU - discussed importance of tight BP control - monitor  Ophthalmic Meds Ordered this visit:  No orders of the defined types were placed in this encounter.    No follow-ups on file.  There are no Patient Instructions on file for this visit.  This document serves as a record of services personally performed by Redell JUDITHANN Hans, MD, PhD. It was created on their behalf by Avelina Pereyra, COA an ophthalmic technician. The creation of this record is the provider's dictation and/or activities during the visit.   Electronically signed by: Avelina GORMAN Pereyra, COT  01/17/24  2:44 PM   Redell JUDITHANN Hans, M.D., Ph.D. Diseases & Surgery of the Retina and Vitreous [facility] 01/30/2024  Abbreviations: M myopia (nearsighted); A astigmatism; H hyperopia (farsighted); P presbyopia; Mrx spectacle prescription;  CTL contact lenses; OD right eye; OS left eye; OU both eyes  XT exotropia; ET esotropia; PEK punctate epithelial keratitis; PEE punctate epithelial erosions; DES dry eye syndrome; MGD meibomian gland dysfunction; ATs artificial tears; PFAT's preservative free artificial tears; NSC nuclear sclerotic cataract; PSC posterior subcapsular cataract; ERM epi-retinal membrane; PVD posterior vitreous detachment; RD retinal detachment; DM diabetes mellitus; DR diabetic retinopathy; NPDR non-proliferative diabetic retinopathy; PDR proliferative diabetic retinopathy; CSME clinically significant macular edema; DME diabetic macular edema; dbh dot blot hemorrhages; CWS cotton wool spot; POAG primary open angle glaucoma; C/D cup-to-disc ratio; HVF humphrey visual field; GVF goldmann visual field; OCT optical coherence tomography; IOP intraocular pressure; BRVO Branch retinal vein occlusion; CRVO central retinal vein occlusion; CRAO central retinal artery occlusion; BRAO branch  retinal artery occlusion; RT retinal tear; SB scleral buckle; PPV pars plana vitrectomy; VH Vitreous hemorrhage; PRP panretinal laser photocoagulation; IVK intravitreal kenalog; VMT vitreomacular traction; MH Macular hole;  NVD neovascularization of the disc; NVE neovascularization elsewhere; AREDS age related eye disease study; ARMD age related macular degeneration; POAG primary open angle glaucoma; EBMD  epithelial/anterior basement membrane dystrophy; ACIOL anterior chamber intraocular lens; IOL intraocular lens; PCIOL posterior chamber intraocular lens; Phaco/IOL phacoemulsification with intraocular lens placement; PRK photorefractive keratectomy; LASIK laser assisted in situ keratomileusis; HTN hypertension; DM diabetes mellitus; COPD chronic obstructive pulmonary disease

## 2024-01-18 ENCOUNTER — Ambulatory Visit: Admitting: Neuroradiology

## 2024-01-18 ENCOUNTER — Other Ambulatory Visit (HOSPITAL_BASED_OUTPATIENT_CLINIC_OR_DEPARTMENT_OTHER): Payer: Self-pay

## 2024-01-18 ENCOUNTER — Encounter: Payer: Self-pay | Admitting: Neuroradiology

## 2024-01-18 VITALS — BP 142/81 | HR 83 | Ht 68.0 in | Wt 165.0 lb

## 2024-01-18 DIAGNOSIS — I672 Cerebral atherosclerosis: Secondary | ICD-10-CM

## 2024-01-18 DIAGNOSIS — Z95828 Presence of other vascular implants and grafts: Secondary | ICD-10-CM | POA: Diagnosis not present

## 2024-01-18 NOTE — Progress Notes (Signed)
 I had the pleasure of meeting Jose Koch in the office today.  Briefly, he had COVID in 2022 and fell in his kitchen striking his head.  He had a head CT and abdomen pelvis CT.  The head CT showed a small hyperdensity in the right parietal white matter which was thought to be a hemorrhage.  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  He had an MRA on 10/24/2020 which was unremarkable with the exception of a stenosis of the cavernous segment of the right internal carotid artery.  This was neurologically asymptomatic.  He had a diagnostic cerebral angiogram on 04/17/2021 and then had stent placement in the cavernous carotid artery on 07/15/2021.  This was done for the asymptomatic carotid stenosis.  He has been on aspirin  and ticagrelor  since then.  He denies any symptoms of stroke, TIA or amaurosis fugax.  I reviewed all of the above imaging, as well as his most recent CT arteriogram from 08/16/2023.  That study shows the stent is patent with no stenosis.  There is no other significant intracranial abnormality.  He has a small amount of plaque in his carotid bifurcations.  He is a non-smoker.  His risk factors for vascular disease include dyslipidemia and type 2 diabetes.  He is insulin -dependent.  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  Continue with 81 mg aspirin .  This may be stopped anytime as needed for bleeding complications, medical or dental procedures Continue medical treatment of dyslipidemia, type 2 diabetes and blood pressure as needed. No routine follow-up imaging needed.  I have not scheduled follow-up with me.  I am glad to see him again anytime if needed.

## 2024-01-20 ENCOUNTER — Other Ambulatory Visit (HOSPITAL_BASED_OUTPATIENT_CLINIC_OR_DEPARTMENT_OTHER): Payer: Self-pay

## 2024-01-26 DIAGNOSIS — N1832 Chronic kidney disease, stage 3b: Secondary | ICD-10-CM | POA: Diagnosis not present

## 2024-01-26 DIAGNOSIS — R809 Proteinuria, unspecified: Secondary | ICD-10-CM | POA: Diagnosis not present

## 2024-01-26 DIAGNOSIS — D631 Anemia in chronic kidney disease: Secondary | ICD-10-CM | POA: Diagnosis not present

## 2024-01-26 DIAGNOSIS — E1122 Type 2 diabetes mellitus with diabetic chronic kidney disease: Secondary | ICD-10-CM | POA: Diagnosis not present

## 2024-01-26 NOTE — Progress Notes (Unsigned)
 Cardiology Office Note   Date:  02/01/2024  ID:  Jose Koch, DOB 03-30-1970, MRN [id number] PCP: Jose Browning, FNP  [facility] Providers Cardiologist:  Jose SHAUNNA Maywood, MD     PMH Dyslipidemia PAD Hypertension Type 2 diabetes Coronary artery disease CCTA 09/08/23 CAC Score 14.1 (68th percentile) Occluded mid RCA with collateral filling of distal RCA/PDA/LV Occluded high OM1 Severe, hemodynamically significant stenosis of prox LAD HFrEF CKD Stage IIIb-IV  Referred to Advanced Lipid disorders clinic and seen by Jose Koch on 10/20/23.  History of PAD, congestive heart failure, and hemodynamically significant CAD.  Cardiac catheterization was recommended, however he was not having symptoms of angina and therefore was referred to cardiac rehab.  He was on atorvastatin  40 mg daily, fenofibrate  160 mg daily, and Vascepa  had recently been added.  He was written a prescription for Repatha  but did not start the medication.  Recent labs showed total cholesterol 492, triglycerides 747, HDL 34, and direct LDL 106.  Target LDL 55 or lower.  He was considered a good candidate for PCSK9 inhibitor and was advised to get repeat lipid testing along with LP(a).  History of Present Illness Discussed the use of AI scribe software for clinical note transcription with the patient, who gave verbal consent to proceed.  History of Present Illness Jose Koch is a pleasant 53 year old male who presents for management of hyperlipidemia. He has recently completed cardiac rehab and felt it was beneficial for him.  He has not started Repatha  yet due to scheduling issues and his fiance's hospitalization, though he has it available at home and plans to begin it soon. Most recent lab LDL was 145 mg/dL, and lipoprotein A has not been checked. He had low blood pressure readings during cardiac rehab but no dizziness. He notes better circulation in his right arm compared with his left. A CT scan of the  neck was done in June 2025. He has an aneurysm and a pseudoaneurysm under surveillance. Neurology recently confirmed a small aneurysm in the neck and advised him to avoid straining because of rupture risk.  He is being followed by Jose Koch for general cardiology. Reports no concerns with starting Repatha  injections given frequent insulin  injections.   ROS: See HPI  Studies Reviewed       No results found for: LIPOA  Risk Assessment/Calculations           Physical Exam VS:  BP 112/82 (BP Location: Left Arm, Patient Position: Sitting, Cuff Size: Normal)   Pulse (!) 50   Ht 5' 8 (1.727 m)   Wt 165 lb 11.2 oz (75.2 kg)   SpO2 97%   BMI 25.19 kg/m    Wt Readings from Last 3 Encounters:  02/01/24 165 lb 11.2 oz (75.2 kg)  01/18/24 165 lb (74.8 kg)  01/11/24 164 lb 3.9 oz (74.5 kg)    GEN: Well nourished, well developed in no acute distress NECK: No JVD; No carotid bruits CARDIAC: RRR, no murmurs, rubs, gallops RESPIRATORY:  Clear to auscultation without rales, wheezing or rhonchi  ABDOMEN: Soft, non-tender, non-distended EXTREMITIES:  No edema; No deformity   Assessment & Plan Hyperlipidemia LDL goal < 55 Given history of CAD and PAD, recommend LDL goal 55 or lower.  Most recent lipid panel 11/15/2023 with total cholesterol 255, triglycerides 416, LDL 145, and HDL 32.  Emphasized the importance of starting Repatha  given significantly elevated triglycerides and LDL.  Lipoprotein a has not been tested.  He denies concerning side effects  on atorvastatin , fenofibrate , and Vascepa .  Plans to start Repatha  this week.  Advised Repatha  injections 140 mg every 14 days, alternating sites with insulin  injection. - Repeat lipid testing after 4 Repatha  injections - Obtain LP(a) for further risk stratification  CAD Ischemic Cardiomyopathy Coronary CTA 09/21/2023 with CAC score of 14.1 (68th percentile), occluded mid RCA with collateral filling of distal RCA/PDA/PLV, occluded high OM1,  severe hemodynamically significant stenosis of proximal LAD.  Recommendation cardiac catheterization, however he was asymptomatic for angina and has CKD stage IIIb, poorly controlled diabetes.  Echo 06/2023 with LV 45%, hypokinesis of inferior and inferoseptal walls, normal RV function, no significant valve disease. He was enrolled in cardiac rehab and recently graduated.  He denies chest pain, dyspnea, or other symptoms concerning for angina.  He is on GDMT with the exception of not having started Repatha  for poorly controlled lipids. - Continue aspirin , Brilinta , atorvastatin , empagliflozin , Vascepa , metoprolol , fenofibrate  - Start Repatha  140 mg every 14 days - Get repeat lipid testing after 4 Repatha  injections - Continue regular follow-up with general cardiology  PAD Aortic atherosclerosis Right internal carotid artery stenosis 30 to 40%, unchanged from CT 07/2022, approximately 30% stenosis in left ICA, also unchanged.  Patent vertebral arteries, aortic atherosclerosis.  History of claudication with stable ABIs on vascular exam 09/2022. -Continue lipid management as noted above - Continue Brilinta , aspirin  - Management per [facility]        Dispo: 6 months with Lipid clinic  Signed, Jose Bane, NP-C

## 2024-01-30 ENCOUNTER — Encounter (INDEPENDENT_AMBULATORY_CARE_PROVIDER_SITE_OTHER): Admitting: Ophthalmology

## 2024-01-30 DIAGNOSIS — E113513 Type 2 diabetes mellitus with proliferative diabetic retinopathy with macular edema, bilateral: Secondary | ICD-10-CM

## 2024-01-30 DIAGNOSIS — H35033 Hypertensive retinopathy, bilateral: Secondary | ICD-10-CM

## 2024-01-30 DIAGNOSIS — I10 Essential (primary) hypertension: Secondary | ICD-10-CM

## 2024-01-30 DIAGNOSIS — Z7984 Long term (current) use of oral hypoglycemic drugs: Secondary | ICD-10-CM

## 2024-01-30 DIAGNOSIS — E1169 Type 2 diabetes mellitus with other specified complication: Secondary | ICD-10-CM

## 2024-01-30 DIAGNOSIS — Z794 Long term (current) use of insulin: Secondary | ICD-10-CM

## 2024-02-01 ENCOUNTER — Other Ambulatory Visit (HOSPITAL_BASED_OUTPATIENT_CLINIC_OR_DEPARTMENT_OTHER): Payer: Self-pay

## 2024-02-01 ENCOUNTER — Encounter (HOSPITAL_BASED_OUTPATIENT_CLINIC_OR_DEPARTMENT_OTHER): Payer: Self-pay | Admitting: Nurse Practitioner

## 2024-02-01 ENCOUNTER — Ambulatory Visit (INDEPENDENT_AMBULATORY_CARE_PROVIDER_SITE_OTHER): Admitting: Nurse Practitioner

## 2024-02-01 DIAGNOSIS — Z79899 Other long term (current) drug therapy: Secondary | ICD-10-CM

## 2024-02-01 DIAGNOSIS — I700 Atherosclerosis of aorta: Secondary | ICD-10-CM | POA: Diagnosis not present

## 2024-02-01 DIAGNOSIS — E781 Pure hyperglyceridemia: Secondary | ICD-10-CM | POA: Diagnosis not present

## 2024-02-01 DIAGNOSIS — I739 Peripheral vascular disease, unspecified: Secondary | ICD-10-CM

## 2024-02-01 DIAGNOSIS — E785 Hyperlipidemia, unspecified: Secondary | ICD-10-CM | POA: Diagnosis not present

## 2024-02-01 DIAGNOSIS — I255 Ischemic cardiomyopathy: Secondary | ICD-10-CM

## 2024-02-01 DIAGNOSIS — I2510 Atherosclerotic heart disease of native coronary artery without angina pectoris: Secondary | ICD-10-CM

## 2024-02-01 NOTE — Patient Instructions (Signed)
 Medication Instructions:   PLEASE START your Repatha   Route: Inject 140 mg into the skin every 14 (fourteen) days. - Subcutaneous      *If you need a refill on your cardiac medications before your next appointment, please call your pharmacy*  Lab Work: Your physician recommends that you return for lab work after 4 injections of Repatha , fasting after midnight. Patient given paperwork today.    Lipid panel and Lp(a)  If you have labs (blood work) drawn today and your tests are completely normal, you will receive your results only by: MyChart Message (if you have MyChart) OR A paper copy in the mail If you have any lab test that is abnormal or we need to change your treatment, we will call you to review the results.  Testing/Procedures:  None ordered.  Follow-Up: At [facility], you and your health needs are our priority.  As part of our continuing mission to provide you with exceptional heart care, our providers are all part of one team.  This team includes your primary Cardiologist (physician) and Advanced Practice Providers or APPs (Physician Assistants and Nurse Practitioners) who all work together to provide you with the care you need, when you need it.  Your next appointment:   6 month(s)  Provider:   Rosaline Bane, NP in Lipid Clinic or Dr. Mona.  We recommend signing up for the patient portal called MyChart.  Sign up information is provided on this After Visit Summary.  MyChart is used to connect with patients for Virtual Visits (Telemedicine).  Patients are able to view lab/test results, encounter notes, upcoming appointments, etc.  Non-urgent messages can be sent to your provider as well.   To learn more about what you can do with MyChart, go to [email or web address].   Other Instructions  Your physician wants you to follow-up in: 6 months.  You will receive a reminder letter in the mail two months in advance. If you don't receive a letter, please call  our office to schedule the follow-up appointment.  Adopting a Healthy Lifestyle.   Weight: Know what a healthy weight is for you (roughly BMI <25) and aim to maintain this. You can calculate your body mass index on your smart phone. Unfortunately, this is not the most accurate measure of healthy weight, but it is the simplest measurement to use. A more accurate measurement involves body scanning which measures lean muscle, fat tissue and bony density. We do not have this equipment at [facility].    Diet: Aim for 7+ servings of fruits and vegetables daily Limit animal fats in diet for cholesterol and heart health - choose grass fed whenever available Avoid highly processed foods (fast food burgers, tacos, fried chicken, pizza, hot dogs, french fries)  Saturated fat comes in the form of butter, lard, coconut oil, margarine, partially hydrogenated oils, dairy products, and fat in meat. These increase your risk of cardiovascular disease.  Use healthy plant oils, such as olive, canola, soy, corn, sunflower and peanut.  Whole foods such as fruits, vegetables and whole grains have fiber  Men need > 38 grams of fiber per day Women need > 25 grams of fiber per day  Load up on vegetables and fruits - one-half of your plate: Aim for color and variety, and remember that potatoes dont count. Go for whole grains - one-quarter of your plate: Whole wheat, barley, wheat berries, quinoa, oats, brown rice, and foods made with them. If you want pasta, go with whole wheat pasta. Protein  power - one-quarter of your plate: Fish, chicken, beans, and nuts are all healthy, versatile protein sources. Limit red meat. You need carbohydrates for energy! The type of carbohydrate is more important than the amount. Choose carbohydrates such as vegetables, fruits, whole grains, beans, and nuts in the place of white rice, white pasta, potatoes (baked or fried), macaroni and cheese, cakes, cookies, and donuts.  If youre thirsty,  drink water. Coffee and tea are good in moderation, but skip sugary drinks and limit milk and dairy products to one or two daily servings. Keep sugar intake at 6 teaspoons or 24 grams or LESS       Exercise: Aim for 150 min of moderate intensity exercise weekly for heart health, and weights twice weekly for bone health Stay active - any steps are better than no steps! Aim for 7-9 hours of sleep daily   Sleep: This provides your body with the reset and relaxation that it needs!  Aim to get 7-8 hours of sleep each night. Limit caffeine, screen time, and other distractions prior to bedtime.  Keep your bedroom cool and dark and do not wear heavy clothing to bed or use heavy bed covers - layer if needed.

## 2024-02-02 ENCOUNTER — Encounter (HOSPITAL_BASED_OUTPATIENT_CLINIC_OR_DEPARTMENT_OTHER): Payer: Self-pay | Admitting: Nurse Practitioner

## 2024-02-03 ENCOUNTER — Inpatient Hospital Stay: Attending: Oncology

## 2024-02-03 DIAGNOSIS — R519 Headache, unspecified: Secondary | ICD-10-CM | POA: Diagnosis not present

## 2024-02-03 DIAGNOSIS — E11311 Type 2 diabetes mellitus with unspecified diabetic retinopathy with macular edema: Secondary | ICD-10-CM | POA: Diagnosis not present

## 2024-02-03 DIAGNOSIS — E538 Deficiency of other specified B group vitamins: Secondary | ICD-10-CM | POA: Diagnosis not present

## 2024-02-03 DIAGNOSIS — Z7982 Long term (current) use of aspirin: Secondary | ICD-10-CM | POA: Diagnosis not present

## 2024-02-03 DIAGNOSIS — R161 Splenomegaly, not elsewhere classified: Secondary | ICD-10-CM | POA: Insufficient documentation

## 2024-02-03 DIAGNOSIS — E1122 Type 2 diabetes mellitus with diabetic chronic kidney disease: Secondary | ICD-10-CM | POA: Diagnosis not present

## 2024-02-03 DIAGNOSIS — R42 Dizziness and giddiness: Secondary | ICD-10-CM | POA: Diagnosis not present

## 2024-02-03 DIAGNOSIS — D509 Iron deficiency anemia, unspecified: Secondary | ICD-10-CM | POA: Insufficient documentation

## 2024-02-03 DIAGNOSIS — R059 Cough, unspecified: Secondary | ICD-10-CM | POA: Diagnosis not present

## 2024-02-03 DIAGNOSIS — D696 Thrombocytopenia, unspecified: Secondary | ICD-10-CM | POA: Insufficient documentation

## 2024-02-03 DIAGNOSIS — K5900 Constipation, unspecified: Secondary | ICD-10-CM | POA: Diagnosis not present

## 2024-02-03 DIAGNOSIS — N189 Chronic kidney disease, unspecified: Secondary | ICD-10-CM | POA: Insufficient documentation

## 2024-02-03 DIAGNOSIS — E611 Iron deficiency: Secondary | ICD-10-CM

## 2024-02-03 DIAGNOSIS — R5383 Other fatigue: Secondary | ICD-10-CM | POA: Insufficient documentation

## 2024-02-03 DIAGNOSIS — E13319 Other specified diabetes mellitus with unspecified diabetic retinopathy without macular edema: Secondary | ICD-10-CM

## 2024-02-03 LAB — CBC WITH DIFFERENTIAL/PLATELET
Abs Immature Granulocytes: 0.02 K/uL (ref 0.00–0.07)
Basophils Absolute: 0 K/uL (ref 0.0–0.1)
Basophils Relative: 1 %
Eosinophils Absolute: 0.1 K/uL (ref 0.0–0.5)
Eosinophils Relative: 2 %
HCT: 38.1 % — ABNORMAL LOW (ref 39.0–52.0)
Hemoglobin: 12.3 g/dL — ABNORMAL LOW (ref 13.0–17.0)
Immature Granulocytes: 0 %
Lymphocytes Relative: 29 %
Lymphs Abs: 1.7 K/uL (ref 0.7–4.0)
MCH: 27.5 pg (ref 26.0–34.0)
MCHC: 32.3 g/dL (ref 30.0–36.0)
MCV: 85.2 fL (ref 80.0–100.0)
Monocytes Absolute: 0.4 K/uL (ref 0.1–1.0)
Monocytes Relative: 6 %
Neutro Abs: 3.6 K/uL (ref 1.7–7.7)
Neutrophils Relative %: 62 %
Platelets: 158 K/uL (ref 150–400)
RBC: 4.47 MIL/uL (ref 4.22–5.81)
RDW: 13.5 % (ref 11.5–15.5)
WBC: 5.8 K/uL (ref 4.0–10.5)
nRBC: 0 % (ref 0.0–0.2)

## 2024-02-03 LAB — COMPREHENSIVE METABOLIC PANEL WITH GFR
ALT: 36 U/L (ref 0–44)
AST: 34 U/L (ref 15–41)
Albumin: 4.4 g/dL (ref 3.5–5.0)
Alkaline Phosphatase: 74 U/L (ref 38–126)
Anion gap: 16 — ABNORMAL HIGH (ref 5–15)
BUN: 33 mg/dL — ABNORMAL HIGH (ref 6–20)
CO2: 20 mmol/L — ABNORMAL LOW (ref 22–32)
Calcium: 9.1 mg/dL (ref 8.9–10.3)
Chloride: 106 mmol/L (ref 98–111)
Creatinine, Ser: 1.77 mg/dL — ABNORMAL HIGH (ref 0.61–1.24)
GFR, Estimated: 46 mL/min — ABNORMAL LOW (ref >60)
Glucose, Bld: 150 mg/dL — ABNORMAL HIGH (ref 70–99)
Potassium: 4.2 mmol/L (ref 3.5–5.1)
Sodium: 141 mmol/L (ref 135–145)
Total Bilirubin: 0.4 mg/dL (ref 0.0–1.2)
Total Protein: 7.3 g/dL (ref 6.5–8.1)

## 2024-02-03 LAB — LACTATE DEHYDROGENASE: LDH: 172 U/L (ref 105–235)

## 2024-02-03 LAB — IRON AND TIBC
Iron: 57 ug/dL (ref 45–182)
Saturation Ratios: 14 % — ABNORMAL LOW (ref 17.9–39.5)
TIBC: 406 ug/dL (ref 250–450)
UIBC: 350 ug/dL

## 2024-02-03 LAB — FERRITIN: Ferritin: 517 ng/mL — ABNORMAL HIGH (ref 24–336)

## 2024-02-03 LAB — FOLATE: Folate: 17.4 ng/mL (ref >5.9)

## 2024-02-03 LAB — VITAMIN B12: Vitamin B-12: 575 pg/mL (ref 180–914)

## 2024-02-06 NOTE — Progress Notes (Shared)
 [facility] - Clinic Note  02/08/2024     CHIEF COMPLAINT Patient presents for Retina Follow Up   HISTORY OF PRESENT ILLNESS: Jose Koch is a 53 y.o. male who presents to the clinic today for:   HPI     Retina Follow Up   Patient presents with  Diabetic Retinopathy.  In both eyes.  This started 4 weeks ago.        Comments   Patient here for 4 weeks retina follow up for PDR OU. Patient states vision seems to be doing better. Can read menus on phone. Able to read descriptions better on phone.      Last edited by Jose Koch, COA on 02/08/2024  1:38 PM.      Pt states he's doing well, girlfriend is improving. Feels he's reading much better.    Referring physician: [name], FNP [address],  [place] [address]  HISTORICAL INFORMATION:   Selected notes from the MEDICAL RECORD NUMBER Referred by Dr. Nanetta Sharps Koch:  Ocular Hx- PMH-    CURRENT MEDICATIONS: No current outpatient medications on file. (Ophthalmic Drugs)   No current facility-administered medications for this visit. (Ophthalmic Drugs)   Current Outpatient Medications (Other)  Medication Sig   aspirin  EC 81 MG tablet Take 81 mg by mouth daily. Swallow whole.   atorvastatin  (LIPITOR) 40 MG tablet Take 1 tablet (40 mg total) by mouth daily.   blood glucose meter kit and supplies KIT Dispense based on patient and insurance preference (something with affordable test strips). Use to check glucose twice daily as directed.   empagliflozin  (JARDIANCE ) 25 MG TABS tablet Take 1 tablet (25 mg total) by mouth daily.   fenofibrate  160 MG tablet Take 1 tablet (160 mg total) by mouth daily.   ferrous sulfate 325 (65 FE) MG tablet Take by mouth daily.   folic acid  (FOLVITE ) 1 MG tablet TAKE 1 TABLET BY MOUTH EVERY DAY   glucose blood (ACCU-CHEK GUIDE) test strip Use as instructed to monitor glucose twice daily, before breakfast and before bed   icosapent  Ethyl  (VASCEPA ) 1 g capsule Take 2 capsules (2 g total) by mouth 2 (two) times daily.   Insulin  Glargine (BASAGLAR  KWIKPEN) 100 UNIT/ML Inject 30 Units into the skin at bedtime.   Insulin  Pen Needle 31G X 5 MM MISC 1 Units by Does not apply route daily.   metoprolol  succinate (TOPROL  XL) 25 MG 24 hr tablet Take 0.5 tablets (12.5 mg total) by mouth daily.   Misc Natural Products (OSTEO BI-FLEX ADV DOUBLE ST PO) Take 1 tablet by mouth.   nitroGLYCERIN  (NITROSTAT ) 0.4 MG SL tablet Place 1 tablet (0.4 mg total) under the tongue every 5 (five) minutes x 3 doses as needed (if no relief after 2nd dose, proceed to the ED or call 911).   OneTouch Delica Lancets 33G MISC PLEASE SEE ATTACHED FOR DETAILED DIRECTIONS   pantoprazole  (PROTONIX ) 40 MG tablet TAKE 1 TABLET BY MOUTH 2 TIMES DAILY BEFORE A MEAL 30 MINUTES BEFORE BREAKFAST AND DINNER   sitaGLIPtin  (JANUVIA ) 100 MG tablet Take 1 tablet (100 mg total) by mouth daily.   sodium bicarbonate  650 MG tablet Take 2 tablets (1,300 mg total) by mouth 2 (two) times daily, in the morning and evening.   Evolocumab  (REPATHA  SURECLICK) 140 MG/ML SOAJ Inject 140 mg into the skin every 14 (fourteen) days. (Patient not taking: Reported on 02/08/2024)   polyethylene glycol-electrolytes (NULYTELY) 420 g solution Take  4,000 mLs by mouth once. (Patient not taking: Reported on 02/08/2024)   No current facility-administered medications for this visit. (Other)   REVIEW OF SYSTEMS: ROS   Positive for: Cardiovascular, Eyes, Respiratory Negative for: Constitutional, Gastrointestinal, Neurological, Skin, Genitourinary, Musculoskeletal, HENT, Endocrine, Psychiatric, Allergic/Imm, Heme/Lymph Last edited by Jose Koch, COA on 02/08/2024  1:38 PM.           ALLERGIES Allergies  Allergen Reactions   Reglan  [Metoclopramide ] Other (See Comments)    Tremors, parkinson like symptoms    PAST MEDICAL HISTORY Past Medical History:  Diagnosis Date   CHF (congestive heart  failure) (HCC)    Chronic kidney disease    Coronary artery disease    COVID    COVID 02/2020   very sick - admitted to the hospital   Diabetes mellitus without complication (HCC)    Diabetic retinopathy (HCC)    Diabetic retinopathy (HCC)    GERD (gastroesophageal reflux disease)    Headache    High cholesterol    History of kidney stones    Hypertension    was diagnosed 2 years ago, never treated and states BP is always - documented 07/13/21   Ichthyosis vulgaris    Macular edema    Pneumonia    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   Past Surgical History:  Procedure Laterality Date   CARDIAC CATHETERIZATION     IR ANGIO INTRA EXTRACRAN SEL COM CAROTID INNOMINATE BILAT MOD SED  04/17/2021   IR ANGIO INTRA EXTRACRAN SEL INTERNAL CAROTID UNI R MOD SED  07/17/2021   IR ANGIO VERTEBRAL SEL VERTEBRAL BILAT MOD SED  04/17/2021   IR CT HEAD LTD  07/15/2021   IR INTRA CRAN STENT  07/15/2021   IR RADIOLOGIST EVAL & MGMT  03/05/2021   IR RADIOLOGIST EVAL & MGMT  05/04/2021   IR RADIOLOGIST EVAL & MGMT  07/31/2021   IR US  GUIDE VASC ACCESS RIGHT  04/17/2021   IR US  GUIDE VASC ACCESS RIGHT  07/15/2021   RADIOLOGY WITH ANESTHESIA N/A 07/15/2021   Procedure: STENTING;  Surgeon: Dolphus Carrion, MD;  Location: [facility] OR;  Service: Radiology;  Laterality: N/A;   TONSILLECTOMY     wisdom teeth removal     FAMILY HISTORY Family History  Problem Relation Age of Onset   Bursitis Mother    Bladder Cancer Mother 44       smoker   SOCIAL HISTORY Social History   Tobacco Use   Smoking status: Never   Smokeless tobacco: Never  Vaping Use   Vaping status: Never Used  Substance Use Topics   Alcohol use: Not Currently   Drug use: Never       OPHTHALMIC EXAM:  Base Eye Exam     Visual Acuity (Snellen - Linear)       Right Left   Dist cc 20/50 -2 20/150    Correction: Glasses         Tonometry (Tonopen, 1:36 PM)       Right Left   Pressure 13 15         Pupils        Dark Light Shape React APD   Right 3 2 Round Brisk None   Left 3 2 Round Brisk None         Visual Fields (Counting fingers)       Left Right    Full Full         Extraocular Movement  Right Left    Full, Ortho Full, Ortho         Neuro/Psych     Oriented x3: Yes   Mood/Affect: Normal         Dilation     Both eyes: 1.0% Mydriacyl, 2.5% Phenylephrine  @ 1:36 PM           Slit Lamp and Fundus Exam     Slit Lamp Exam       Right Left   Lids/Lashes Dermatochalasis - upper lid, Meibomian gland dysfunction, +Scurf Dermatochalasis - upper lid, Meibomian gland dysfunction, +Scurf   Conjunctiva/Sclera White and quiet White and quiet   Cornea trace PEE, mild tear film debris 1+PEE, mild tear film debris   Anterior Chamber deep and clear deep and clear   Iris Round and dilated, No NVI Round and dilated, No NVI   Lens Clear Clear   Anterior Vitreous Vitreous syneresis mild syneresis, Posterior vitreous detachment         Fundus Exam       Right Left   Disc Pink and Sharp, no heme Pink and sharp, fine NVD -- regressed, 360 exudates   C/D Ratio 0.2 0.2   Macula Blunted foveal reflex, severe edema with severe exudation -- improving, scattered DBH -- slightly improved Blunted foveal reflex, severe edema and exudation, scattered DBH -- improving   Vessels attenuated, Tortuous, +NVE greatest along inferior arcades -- regressed, +Sheathing -- improving attenuated, Tortuous, +NV- regressed, +Sheathing of arterioles- improved   Periphery Attached, scattered MA/DBH, heavy exudates greatest posteriorly Attached, +exudation posteriorly-improving, scattered MA / DBH           Refraction     Wearing Rx       Sphere Cylinder Axis Add   Right -2.50 +1.00 049 +1.00   Left -0.75 +1.25 178 +1.00            IMAGING AND PROCEDURES  Imaging and Procedures for 02/08/2024  OCT, Retina - OU - Both Eyes       Right Eye Quality was good. Central Foveal  Thickness: 300. Progression has improved. Findings include no SRF, abnormal foveal contour, subretinal hyper-reflective material, intraretinal hyper-reflective material, intraretinal fluid, vitreomacular adhesion (Persistent IRF, IRHM and SRHM greatest IN macula--slightly improved).   Left Eye Quality was good. Central Foveal Thickness: 270. Progression has improved. Findings include no SRF, abnormal foveal contour, subretinal hyper-reflective material, intraretinal hyper-reflective material, epiretinal membrane, intraretinal fluid, lamellar hole, macular pucker (Persistent edema, IRHM and SRHM--slightly improved, +ERM with pucker).   Notes *Images captured and stored on drive  Diagnosis / Impression:  OD: Persistent IRF, IRHM and SRHM greatest IN macula--slightly improved  OS: Persistent edema, IRHM and SRHM--slightly improved, +ERM with pucker  Clinical management:  See below  Abbreviations: NFP - Normal foveal profile. CME - cystoid macular edema. PED - pigment epithelial detachment. IRF - intraretinal fluid. SRF - subretinal fluid. EZ - ellipsoid zone. ERM - epiretinal membrane. ORA - outer retinal atrophy. ORT - outer retinal tubulation. SRHM - subretinal hyper-reflective material. IRHM - intraretinal hyper-reflective material              ASSESSMENT/PLAN:    ICD-10-CM   1. Proliferative diabetic retinopathy of both eyes with macular edema associated with type 2 diabetes mellitus (HCC)  E11.3513 OCT, Retina - OU - Both Eyes    2. Current use of insulin  (HCC)  Z79.4     3. Long term (current) use of oral hypoglycemic drugs  Z79.84  4. Hyperlipidemia associated with type 2 diabetes mellitus (HCC)  E11.69    E78.5     5. Essential hypertension  I10     6. Hypertensive retinopathy of both eyes  H35.033      1-3. Proliferative diabetic retinopathy, both eyes  - A1c 9-pt reported on 08.18.25, 7.4 on 08.11.23  - s/p IVA OS #1 (02.22.24), #2 (03.22.24), #3 (04.19.24)   -s/p IVA OD #1 (02.23.24), #2 (03.22.24) #3 (04.19.24), #4 (05.17.24), #5 (06.18.24), #6 (11.08.24)  -- IVA resistance OU ================= - s/p IVE OD #1 (06.18.24), #2 (07.16.24), #3 (08.13.24), #4 (09.10.24), #5 (10.08.24), #6 (11.08.24), #7 (12.06.24), #8 (01.03.25), #9 (01.31.25), #10 (02.28.25), #11 (03.28.25) - s/p IVE OS #1 (05.17.24), #2 (06.18.24), #3 (7.16.24), #4 (08.13.24), #5 (09.10.24), #6 (10.08.24), #7 (11.08.24), #8 (12.06.24), #9 (01.03.25), #10 (01.31.25), #11 (02.28.25), #12 (03.28.25)  -- IVE resistance OU ================= - IVV OU #1 (04.25.25), #2 (05.23.25), #3 (06.23.25), #4 (07.21.25), #5 (08.18.25), #6 (09.15.25), #7 (10.13.25), #8 (11.10.25) - exam shows severe edema with scattered MA and DBH, but significant lipid component to edema OU - FA (02.21.24) shows OD: Large cluster of leaking NV along IT arcades, scattered patches of vascular non-perfusion; OS: +NVD and NVE along proximal arcades - repeat FA (10.13.25) shows OD: Interval improvement in large cluster of leaking NV along IT arcades, scattered patches of vascular non-perfusion; OS: Interval resolution of +NVD and NVE along proximal arcades - BCVA OD 20/50 stable; OS 20/150 - stable - OCT shows OD: Persistent IRF, IRHM and SRHM greatest IN macula--slightly improved; OS: Persistent edema, IRHM and SRHM, +ERM with pucker at 4 wks - recommend IVV today OU #9 (12.17.25) w/ f/u in 4-5wks - pt wishes to proceed - RBA of procedure discussed, questions answered - IVV informed consent obtained and signed, 04.25.25 (OU) - see procedure note - Eylea  is approved for 2025, [email or web address] approved - Vabysmo  is approved for 2025, co-pay program approved - f/u 4-5 weeks -- DFE/OCT, possible injection(s)  4. Hyperlipidemia  - review of labs shows severely elevated cholesterol and lipids  - 08.11.23:  Trig  785 Chol  598  LDL  410 - 01.17.25: Trig 335   Chol 279   LDL 173 - likely contributing to severe edema and  exudate  5,6. Hypertensive retinopathy OU - discussed importance of tight BP control - monitor  Ophthalmic Meds Ordered this visit:  No orders of the defined types were placed in this encounter.    Return for PDR OU, DFE, OCT.  There are no Patient Instructions on file for this visit.  This document serves as a record of services personally performed by Redell JUDITHANN Hans, MD, PhD. It was created on their behalf by Almetta Pesa, an ophthalmic technician. The creation of this record is the provider's dictation and/or activities during the visit.    Electronically signed by: Almetta Pesa, OA, 02/08/2024  2:08 PM  This document serves as a record of services personally performed by Redell JUDITHANN Hans, MD, PhD. It was created on their behalf by Wanda GEANNIE Keens, COT an ophthalmic technician. The creation of this record is the provider's dictation and/or activities during the visit.    Electronically signed by:  Wanda GEANNIE Keens, COT  02/08/2024 2:08 PM    Redell JUDITHANN Hans, M.D., Ph.D. Diseases & Surgery of the Retina and Vitreous [facility] 01/30/2024  Abbreviations: M myopia (nearsighted); A astigmatism; H hyperopia (farsighted); P presbyopia; Mrx spectacle prescription;  CTL contact lenses; OD right  eye; OS left eye; OU both eyes  XT exotropia; ET esotropia; PEK punctate epithelial keratitis; PEE punctate epithelial erosions; DES dry eye syndrome; MGD meibomian gland dysfunction; ATs artificial tears; PFAT's preservative free artificial tears; NSC nuclear sclerotic cataract; PSC posterior subcapsular cataract; ERM epi-retinal membrane; PVD posterior vitreous detachment; RD retinal detachment; DM diabetes mellitus; DR diabetic retinopathy; NPDR non-proliferative diabetic retinopathy; PDR proliferative diabetic retinopathy; CSME clinically significant macular edema; DME diabetic macular edema; dbh dot blot hemorrhages; CWS cotton wool spot; POAG primary open  angle glaucoma; C/D cup-to-disc ratio; HVF humphrey visual field; GVF goldmann visual field; OCT optical coherence tomography; IOP intraocular pressure; BRVO Branch retinal vein occlusion; CRVO central retinal vein occlusion; CRAO central retinal artery occlusion; BRAO branch retinal artery occlusion; RT retinal tear; SB scleral buckle; PPV pars plana vitrectomy; VH Vitreous hemorrhage; PRP panretinal laser photocoagulation; IVK intravitreal kenalog; VMT vitreomacular traction; MH Macular hole;  NVD neovascularization of the disc; NVE neovascularization elsewhere; AREDS age related eye disease study; ARMD age related macular degeneration; POAG primary open angle glaucoma; EBMD epithelial/anterior basement membrane dystrophy; ACIOL anterior chamber intraocular lens; IOL intraocular lens; PCIOL posterior chamber intraocular lens; Phaco/IOL phacoemulsification with intraocular lens placement; PRK photorefractive keratectomy; LASIK laser assisted in situ keratomileusis; HTN hypertension; DM diabetes mellitus; COPD chronic obstructive pulmonary disease

## 2024-02-08 ENCOUNTER — Encounter (INDEPENDENT_AMBULATORY_CARE_PROVIDER_SITE_OTHER): Payer: Self-pay | Admitting: Ophthalmology

## 2024-02-08 ENCOUNTER — Ambulatory Visit (INDEPENDENT_AMBULATORY_CARE_PROVIDER_SITE_OTHER): Admitting: Ophthalmology

## 2024-02-08 DIAGNOSIS — H35033 Hypertensive retinopathy, bilateral: Secondary | ICD-10-CM | POA: Diagnosis not present

## 2024-02-08 DIAGNOSIS — E1169 Type 2 diabetes mellitus with other specified complication: Secondary | ICD-10-CM

## 2024-02-08 DIAGNOSIS — I10 Essential (primary) hypertension: Secondary | ICD-10-CM | POA: Diagnosis not present

## 2024-02-08 DIAGNOSIS — Z794 Long term (current) use of insulin: Secondary | ICD-10-CM | POA: Diagnosis not present

## 2024-02-08 DIAGNOSIS — Z7984 Long term (current) use of oral hypoglycemic drugs: Secondary | ICD-10-CM | POA: Diagnosis not present

## 2024-02-08 DIAGNOSIS — E113513 Type 2 diabetes mellitus with proliferative diabetic retinopathy with macular edema, bilateral: Secondary | ICD-10-CM | POA: Diagnosis not present

## 2024-02-08 MED ORDER — FARICIMAB-SVOA 6 MG/0.05ML IZ SOSY
6.0000 mg | PREFILLED_SYRINGE | INTRAVITREAL | Status: AC | PRN
  Administered 2024-02-08: 22:00:00 6 mg via INTRAVITREAL

## 2024-02-09 ENCOUNTER — Ambulatory Visit: Attending: Internal Medicine | Admitting: Internal Medicine

## 2024-02-09 ENCOUNTER — Encounter: Payer: Self-pay | Admitting: Internal Medicine

## 2024-02-09 VITALS — BP 118/78 | HR 88 | Ht 68.0 in | Wt 167.0 lb

## 2024-02-09 DIAGNOSIS — I2510 Atherosclerotic heart disease of native coronary artery without angina pectoris: Secondary | ICD-10-CM | POA: Diagnosis not present

## 2024-02-09 DIAGNOSIS — Z01810 Encounter for preprocedural cardiovascular examination: Secondary | ICD-10-CM | POA: Diagnosis not present

## 2024-02-09 DIAGNOSIS — Z7902 Long term (current) use of antithrombotics/antiplatelets: Secondary | ICD-10-CM | POA: Diagnosis not present

## 2024-02-09 NOTE — Progress Notes (Signed)
 Cardiology Office Note  Date: 02/09/2024   ID: Jose Koch, DOB 16-Jul-1970, MRN [id number]  PCP:  Jose Browning, FNP  Cardiologist:  Jose SHAUNNA Maywood, MD Electrophysiologist:  None   History of Present Illness: Jose Koch is a 53 y.o. male known to have multivessel CAD, moderate lower EXTR PAD, s/p R ICA stent, hypertriglyceridemia, hyperlipidemia, DM 2 is here for follow-up visit.  Initially referred to cardiology clinic for the management of hyper triglyceridemia.  Initial TG 335 after being on high intensity statin and fenofibrate  but repeat TG was 446 in May 2025.  Currently on atorvastatin  40 mg nightly, fenofibrate  160 mg once daily, Vascepa  2 g twice daily.  He was referred to lipid clinic, Repatha  was started however he did not get a chance to take these due to his fiance's current hospitalization.  He graduated from cardiac rehab, did not have any angina or DOE.  He was told he had low blood pressures after exercise.  He was told he had a bulge in his heart for which echocardiogram was performed that showed LVEF 45% with hypokinesis of inferoseptal and inferior walls, normal RV function, no valvular heart disease and CVP was 3 mmHg. Eventually CT cardiac was performed that showed coronary calcium  score of 14.1, 68th percentile for age and sex matched control, occluded mid RCA with collateral filling of the distal RCA/PDA/PLV, occluded high OM1, severe hemodynamic significant stenosis of the proximal LAD.  This was a high risk study and cardiac catheterization was suggested. Patient is here for follow-up visit.  Continues to deny any symptoms of angina or DOE.  No dizziness, palpitations, syncope, leg swelling.  He did not get a chance to take Repatha  due to his fiance current hospitalization.  She will be discharged home with home dialysis in 3 weeks.  Past Medical History:  Diagnosis Date   CHF (congestive heart failure) (HCC)    Chronic kidney disease    Coronary  artery disease    COVID    COVID 02/2020   very sick - admitted to the hospital   Diabetes mellitus without complication (HCC)    Diabetic retinopathy (HCC)    Diabetic retinopathy (HCC)    GERD (gastroesophageal reflux disease)    Headache    High cholesterol    History of kidney stones    Hypertension    was diagnosed 2 years ago, never treated and states BP is always - documented 07/13/21   Ichthyosis vulgaris    Macular edema    Pneumonia    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    Past Surgical History:  Procedure Laterality Date   CARDIAC CATHETERIZATION     IR ANGIO INTRA EXTRACRAN SEL COM CAROTID INNOMINATE BILAT MOD SED  04/17/2021   IR ANGIO INTRA EXTRACRAN SEL INTERNAL CAROTID UNI R MOD SED  07/17/2021   IR ANGIO VERTEBRAL SEL VERTEBRAL BILAT MOD SED  04/17/2021   IR CT HEAD LTD  07/15/2021   IR INTRA CRAN STENT  07/15/2021   IR RADIOLOGIST EVAL & MGMT  03/05/2021   IR RADIOLOGIST EVAL & MGMT  05/04/2021   IR RADIOLOGIST EVAL & MGMT  07/31/2021   IR US  GUIDE VASC ACCESS RIGHT  04/17/2021   IR US  GUIDE VASC ACCESS RIGHT  07/15/2021   RADIOLOGY WITH ANESTHESIA N/A 07/15/2021   Procedure: STENTING;  Surgeon: Dolphus Carrion, MD;  Location: [facility] OR;  Service: Radiology;  Laterality: N/A;   TONSILLECTOMY     wisdom teeth removal  Current Outpatient Medications  Medication Sig Dispense Refill   aspirin  EC 81 MG tablet Take 81 mg by mouth daily. Swallow whole.     atorvastatin  (LIPITOR) 40 MG tablet Take 1 tablet (40 mg total) by mouth daily. 90 tablet 2   blood glucose meter kit and supplies KIT Dispense based on patient and insurance preference (something with affordable test strips). Use to check glucose twice daily as directed. 1 each 0   empagliflozin  (JARDIANCE ) 25 MG TABS tablet Take 1 tablet (25 mg total) by mouth daily. 90 tablet 1   fenofibrate  160 MG tablet Take 1 tablet (160 mg total) by mouth daily. 90 tablet 1   ferrous sulfate 325 (65 FE) MG  tablet Take by mouth daily.     folic acid  (FOLVITE ) 1 MG tablet TAKE 1 TABLET BY MOUTH EVERY DAY 90 tablet 1   glucose blood (ACCU-CHEK GUIDE) test strip Use as instructed to monitor glucose twice daily, before breakfast and before bed 100 each 12   icosapent  Ethyl (VASCEPA ) 1 g capsule Take 2 capsules (2 g total) by mouth 2 (two) times daily. 120 capsule 5   Insulin  Glargine (BASAGLAR  KWIKPEN) 100 UNIT/ML Inject 30 Units into the skin at bedtime.     Insulin  Pen Needle 31G X 5 MM MISC 1 Units by Does not apply route daily. 100 each 1   metoprolol  succinate (TOPROL  XL) 25 MG 24 hr tablet Take 0.5 tablets (12.5 mg total) by mouth daily. 45 tablet 2   Misc Natural Products (OSTEO BI-FLEX ADV DOUBLE ST PO) Take 1 tablet by mouth.     nitroGLYCERIN  (NITROSTAT ) 0.4 MG SL tablet Place 1 tablet (0.4 mg total) under the tongue every 5 (five) minutes x 3 doses as needed (if no relief after 2nd dose, proceed to the ED or call 911). 25 tablet 3   OneTouch Delica Lancets 33G MISC PLEASE SEE ATTACHED FOR DETAILED DIRECTIONS     pantoprazole  (PROTONIX ) 40 MG tablet TAKE 1 TABLET BY MOUTH 2 TIMES DAILY BEFORE A MEAL 30 MINUTES BEFORE BREAKFAST AND DINNER 60 tablet 3   polyethylene glycol-electrolytes (NULYTELY) 420 g solution Take 4,000 mLs by mouth once.     sitaGLIPtin  (JANUVIA ) 100 MG tablet Take 1 tablet (100 mg total) by mouth daily. 90 tablet 0   sodium bicarbonate  650 MG tablet Take 2 tablets (1,300 mg total) by mouth 2 (two) times daily, in the morning and evening. 120 tablet 11   Evolocumab  (REPATHA  SURECLICK) 140 MG/ML SOAJ Inject 140 mg into the skin every 14 (fourteen) days. (Patient not taking: Reported on 02/09/2024) 6 mL 1   No current facility-administered medications for this visit.   Allergies:  Reglan  [metoclopramide ]   Social History: The patient  reports that he has never smoked. He has never used smokeless tobacco. He reports that he does not currently use alcohol. He reports that he does  not use drugs.   Family History: The patient's family history includes Bladder Cancer (age of onset: 29) in his mother; Bursitis in his mother.   ROS:  Please see the history of present illness. Otherwise, complete review of systems is positive for none.  All other systems are reviewed and negative.   Physical Exam: VS:  BP 118/78 (BP Location: Left Arm, Cuff Size: Normal)   Pulse 88   Ht 5' 8 (1.727 m)   Wt 167 lb (75.8 kg)   SpO2 98%   BMI 25.39 kg/m , BMI Body mass index is 25.39 kg/m.  Wt Readings from Last 3 Encounters:  02/09/24 167 lb (75.8 kg)  02/01/24 165 lb 11.2 oz (75.2 kg)  01/18/24 165 lb (74.8 kg)    General: Patient appears comfortable at rest. HEENT: Conjunctiva and lids normal, oropharynx clear with moist mucosa. Neck: Supple, no elevated JVP or carotid bruits, no thyromegaly. Lungs: Clear to auscultation, nonlabored breathing at rest. Cardiac: Regular rate and rhythm, no S3 or significant systolic murmur, no pericardial rub. Abdomen: Soft, nontender, no hepatomegaly, bowel sounds present, no guarding or rebound. Extremities: No pitting edema, distal pulses 2+. Skin: Warm and dry. Musculoskeletal: No kyphosis. Neuropsychiatric: Alert and oriented x3, affect grossly appropriate.  Recent Labwork: 02/03/2024: ALT 36; AST 34; BUN 33; Creatinine, Ser 1.77; Hemoglobin 12.3; Platelets 158; Potassium 4.2; Sodium 141     Component Value Date/Time   CHOL 492 (H) 04/14/2020 1551   TRIG 747 (H) 04/14/2020 1551   HDL 34 (L) 04/14/2020 1551   CHOLHDL 14.5 04/14/2020 1551   VLDL UNABLE TO CALCULATE IF TRIGLYCERIDE OVER 400 mg/dL 97/78/7977 8448   LDLCALC UNABLE TO CALCULATE IF TRIGLYCERIDE OVER 400 mg/dL 97/78/7977 8448   LDLDIRECT 106.3 (H) 04/14/2020 1551     Assessment and Plan:  Hypertriglyceridemia Hyperlipidemia - LDL 138 and TG 446 in 06/2023 - Continue atorvastatin  40 mg nightly, fenofibrate  160 mg once daily and continue Vascepa  2 g twice daily.   Prescribed Repatha  but he did not get a chance to start due to his fiance's current hospitalization.  Strongly encouraged to take Repatha  as soon as possible. - Genetic Testing for lipid disorders negative for any genetic etiology. Report scanned.  Follows up with lipid clinic. - HbA1c is usually around 9.  Aggressive control of DM 2 recommended.  Chronic systolic heart failure, compensated - No DOE, orthopnea, PND or leg swelling. - Echocardiogram showed LVEF 45% with hypokinesis of inferoseptal and inferior walls, normal RV function, no valvular heart disease and CVP 3 mmHg. - Continue metoprolol  succinate 12.5 mg once daily. - Not a candidate for ACE inhibitor/ARB/Arni/SGLT2 inhibitor/MRA due to CKD stage IIIb-IV - Graduated from cardiac rehab, did well.  CAD - No angina or DOE.  Continue cardioprotective medications. - CT cardiac showed coronary calcium  score of 14.1, 68th percentile for age and sex matched control, occluded mid RCA with collateral filling of the distal RCA/PDA/PLV, occluded high OM1, severe hemodynamically significant stenosis of the proximal LAD.  This was a high risk study and cardiac catheterization was suggested.  Echo showed LVEF 45% and RWMA. - He does not have any angina or DOE.  He, in fact, reported improvement in his stamina levels after going to the cardiac rehab.  He was recently discharged from cardiac rehab.  He was told he had low blood pressures after exercise.  Previously the plan was to perform LHC only with ACS event due to CKD stage IIIb.  However due to chronic poorly controlled diabetes mellitus with recent HbA1c around 9-ish and poorly controlled triglycerides, his CAD as well as CKD might get worse.  Current serum creatinine 1.7.  Will schedule him for diagnostic LHC to define coronary anatomy.  He had heme positive stool, colonoscopy/EGD procedure was canceled at [facility] as he was high risk due to severe multivessel CAD.  His hemoglobin has  been stable and he did not have any active bleeding so far.  Continue aspirin .  Okay to proceed with LHC.  His fiance is currently hospitalized and will be home in 3 weeks with home dialysis.  He would like this procedure to be scheduled in 6 weeks.  Will do the same.  Informed consent for LHC Risks and benefits of cardiac catheterization have been discussed with the patient.  These include bleeding, infection, kidney damage, stroke, heart attack, death.  The patient understands these risks and is willing to proceed.   S/p R ICA in 2023 - Continue aspirin  81 mg once daily, other cardioprotective medications as above. - Follows with neurology.  Moderate lower extremity PAD - He has bilateral lower EXTR claudication, L>R - Follows with vascular surgery. - Continue cardioprotective medications as above.   I spent 25 minutes with the patient discussing cath, CT cardiac findings.  More than 3 labs reviewed.  Reports reviewed.  Complex decision making.  10-15 minutes documentation.  Medication Adjustments/Labs and Tests Ordered: Current medicines are reviewed at length with the patient today.  Concerns regarding medicines are outlined above.    Disposition:  Follow up 3 months, LHC in 6 weeks  Signed, Ayliana Casciano Arleta Maywood, MD, 02/09/2024 10:57 AM    [facility] at [facility] [address], [place], [place] [address]

## 2024-02-09 NOTE — Patient Instructions (Signed)
 Medication Instructions:  Your physician recommends that you continue on your current medications as directed. Please refer to the Current Medication list given to you today.   Labwork: CBC and BMET to be completed one week prior to Cath at [facility]  Testing/Procedures:  [place] [facility] OF [place] [place] [address] [place] [facility] [address] Dept: [phone] Loc: [phone]  Jose Koch  02/09/2024  You are scheduled for a Cardiac Catheterization on Monday, January 29 with Dr. Lonni Cash.  1. Please arrive at the [facility] (Main Entrance A) at [facility]: [address] [place], [place] [address] at 7:00 AM (This time is 2 hour(s) before your procedure to ensure your preparation).   Free valet parking service is available. You will check in at ADMITTING. The support person will be asked to wait in the waiting room.  It is OK to have someone drop you off and come back when you are ready to be discharged.    Special note: Every effort is made to have your procedure done on time. Please understand that emergencies sometimes delay scheduled procedures.  2. Diet: Nothing to eat after midnight.   3. Hydration: You need to be well hydrated before your procedure. On January 29, you may drink approved liquids (see below) until 2 hours before the procedure, with 16 oz of water as your last intake.   List of approved liquids water, clear juice, clear tea, black coffee, fruit juices, non-citric and without pulp, carbonated beverages, Gatorade, Kool -Aid, plain Jello-O and plain ice popsicles.  4. Labs: You will need to have blood drawn on Monday, January 19 at [facility]. [address]  You do not need to be fasting.  5. Medication instructions in preparation for your procedure:   Contrast Allergy: No  Take only 15 units of insulin  the night before your procedure. Do not take any insulin   on the day of the procedure.   Do not take Diabetes Med Jardiance  and Januvia  on the day of the procedure and HOLD 48 HOURS AFTER THE PROCEDURE.  On the morning of your procedure, take your Aspirin  81 mg and any morning medicines NOT listed above.  You may use sips of water.  6. Plan to go home the same day, you will only stay overnight if medically necessary. 7. Bring a current list of your medications and current insurance cards. 8. You MUST have a responsible person to drive you home. 9. Someone MUST be with you the first 24 hours after you arrive home or your discharge will be delayed. 10. Please wear clothes that are easy to get on and off and wear slip-on shoes.  Thank you for allowing us  to care for you!   -- [facility]   Follow-Up: Your physician recommends that you schedule a follow-up appointment in: 1 month prior to [facility]  Any Other Special Instructions Will Be Listed Below (If Applicable). Thank you for choosing  HeartCare!     If you need a refill on your cardiac medications before your next appointment, please call your pharmacy.

## 2024-02-10 ENCOUNTER — Encounter: Payer: Self-pay | Admitting: Oncology

## 2024-02-10 ENCOUNTER — Inpatient Hospital Stay: Admitting: Oncology

## 2024-02-10 DIAGNOSIS — D696 Thrombocytopenia, unspecified: Secondary | ICD-10-CM

## 2024-02-10 DIAGNOSIS — D509 Iron deficiency anemia, unspecified: Secondary | ICD-10-CM

## 2024-02-10 DIAGNOSIS — E11311 Type 2 diabetes mellitus with unspecified diabetic retinopathy with macular edema: Secondary | ICD-10-CM

## 2024-02-10 DIAGNOSIS — R161 Splenomegaly, not elsewhere classified: Secondary | ICD-10-CM

## 2024-02-10 DIAGNOSIS — E538 Deficiency of other specified B group vitamins: Secondary | ICD-10-CM

## 2024-02-10 DIAGNOSIS — E13319 Other specified diabetes mellitus with unspecified diabetic retinopathy without macular edema: Secondary | ICD-10-CM

## 2024-02-10 DIAGNOSIS — E611 Iron deficiency: Secondary | ICD-10-CM

## 2024-02-10 NOTE — Progress Notes (Signed)
 Virtual Visit via Telephone Note  I connected with Jose Koch on 02/10/2024 at 10:00 AM EST by telephone and verified that I am speaking with the correct person using two identifiers.  Location: Patient: Home  Provider: Clinic    I discussed the limitations, risks, security and privacy concerns of performing an evaluation and management service by telephone and the availability of in person appointments. I also discussed with the patient that there may be a patient responsible charge related to this service. The patient expressed understanding and agreed to proceed.   History of Present Illness: Patient is a 53 year old male with past medical history significant for thrombocytopenia and splenomegaly.   He continues to be followed closely by ophthalmology for newly diagnosed diabetic retinopathy in both eyes.  He continues to receive bilateral eye injections (Eylea ).  His eyesight has improved.  Reports overall he is doing well.  No pain.  Has an occasional cough.  Reports constipation secondary to iron tablets.  Denies any bleeding. Reports he has not had a fall since Christmas.  Things appear to be improving.  He has occasional dizziness and headaches.   Patient is followed closely by cardiology for hyperlipidemia and cardiac cath was recommended but he has deferred and underwent cardiac rehab instead.  He was started on Repatha .    He is followed by nephrology Dr. Dennise for history of diabetes and CKD.  Reports overall he is doing well.  He is waiting for his fiance to return home from the hospital.  She should be getting out at the beginning of the new year.  He is currently taking iron supplements daily with vitamin C along with folic acid .  Reports he is tolerating these well.  He denies any melena, hematochezia or bright red blood per rectum.  Observations/Objective: Review of Systems  Constitutional:  Positive for malaise/fatigue.  Respiratory:  Positive for cough.    Gastrointestinal:  Positive for constipation. Negative for abdominal pain, blood in stool and diarrhea.  Musculoskeletal:  Positive for falls.  Neurological:  Positive for dizziness.   Physical Exam Vitals reviewed.  Neurological:     Mental Status: He is alert and oriented to person, place, and time.      Assessment and Plan: 1. Splenomegaly (Primary) -CT AP on 12/03/2020 showed stable splenomegaly with no significant change in multiple splenic hypodensities.  No adenopathy. -He recently had an ultrasound of his abdomen in October 2025 which showed improvement of his splenomegaly.  He is followed by GI for this. -Denies any B symptoms. -He is currently on aspirin  and Brilinta  for right ICA stent placement. -Labs from 02/03/2024 show hemoglobin of 12.3 and platelets of 158.  Differential is unremarkable.  2. Thrombocytopenia (HCC) -Mild to moderate thrombocytopenia since 2017 likely from splenomegaly. -Most recent labs show a platelet count of 158. -Recheck labs in 6 months.  3. Folate deficiency -Previously noted to have a low folate level at 4.2. -He was started on 1 mg folic acid  supplements daily. -Repeat folate levels from 08/05/23 are normal at 18.1.  -Continue every other day folic acid .  4. Diabetic retinopathy of both eyes associated with diabetes mellitus of other type, macular edema presence unspecified, unspecified retinopathy severity (HCC) -He is followed by Dr. Valdemar ophthalmology and has been receiving Eylea  injections into both his eyes.  This appears to be helping his vision.  6. IDA -Patient has been anemic since February 2022. -He has been anywhere between 9.5 and 12.5. -he denies any bleeding.  He is  taking oral iron supplements daily with mild constipation. - He is currently on oral iron supplements daily with vitamin C. -Most recent iron levels showing improvement of his hemoglobin to 12.3 (11.1), ferritin 517 and saturations of 14%.  Follow Up  Instructions: -Continue every other day folic acid . -Continue iron twice per day along with vitamin C to increase absorption.  Please take at least 6 hours apart from your Protonix  to increase absorption.  If unable to tolerate, reduce back down to 1 daily. -Return to clinic in 6 months with labs a few days before and office visit.   I discussed the assessment and treatment plan with the patient. The patient was provided an opportunity to ask questions and all we with vitamin C re answered. The patient agreed with the plan and demonstrated an understanding of the instructions.   The patient was advised to call back or seek an in-person evaluation if the symptoms worsen or if the condition fails to improve as anticipated.  I provided 20 minutes of non face-to-face telephone visit time during this encounter, and > 50% was spent counseling as documented under my assessment & plan.  Jose FORBES Hope, NP

## 2024-02-20 ENCOUNTER — Encounter: Payer: Self-pay | Admitting: *Deleted

## 2024-02-24 ENCOUNTER — Other Ambulatory Visit (HOSPITAL_BASED_OUTPATIENT_CLINIC_OR_DEPARTMENT_OTHER): Payer: Self-pay

## 2024-02-27 ENCOUNTER — Other Ambulatory Visit (HOSPITAL_BASED_OUTPATIENT_CLINIC_OR_DEPARTMENT_OTHER): Payer: Self-pay

## 2024-02-27 ENCOUNTER — Other Ambulatory Visit: Payer: Self-pay

## 2024-02-28 ENCOUNTER — Other Ambulatory Visit (HOSPITAL_BASED_OUTPATIENT_CLINIC_OR_DEPARTMENT_OTHER): Payer: Self-pay

## 2024-03-06 NOTE — Progress Notes (Signed)
 " [facility] - Clinic Note  03/13/2024     CHIEF COMPLAINT Patient presents for Retina Follow Up   HISTORY OF PRESENT ILLNESS: Jose Koch is a 54 y.o. male who presents to the clinic today for:   HPI     Retina Follow Up   Patient presents with  Diabetic Retinopathy.  In both eyes.  This started 4 weeks ago.  Duration of 4 weeks.  Since onset it is stable.  I, the attending physician,  performed the HPI with the patient and updated documentation appropriately.        Comments   4 week retina follow up PDR OU and IVV OU pt is reporting no vision changes noticed he denies any flashes has some floaters his last reading 236 this am       Last edited by [name], MD on 03/13/2024  9:02 PM.     Pt states he's doing well, girlfriend is improving, she's home on dialysis. Feels he's reading much better w/ magnifying glass    Referring physician: [name], FNP [address] [name],  [place] [address]  HISTORICAL INFORMATION:   Selected notes from the MEDICAL RECORD NUMBER Referred by Dr. Nanetta Sharps LEE:  Ocular Hx- PMH-    CURRENT MEDICATIONS: No current outpatient medications on file. (Ophthalmic Drugs)   No current facility-administered medications for this visit. (Ophthalmic Drugs)   Current Outpatient Medications (Other)  Medication Sig   aspirin  EC 81 MG tablet Take 81 mg by mouth daily. Swallow whole.   atorvastatin  (LIPITOR) 40 MG tablet Take 1 tablet (40 mg total) by mouth daily.   blood glucose meter kit and supplies KIT Dispense based on patient and insurance preference (something with affordable test strips). Use to check glucose twice daily as directed.   empagliflozin  (JARDIANCE ) 25 MG TABS tablet Take 1 tablet (25 mg total) by mouth daily.   Evolocumab  (REPATHA  SURECLICK) 140 MG/ML SOAJ Inject 140 mg into the skin every 14 (fourteen) days.   fenofibrate  160 MG tablet Take 1 tablet (160 mg total) by mouth daily.    ferrous sulfate 325 (65 FE) MG tablet Take by mouth daily.   folic acid  (FOLVITE ) 1 MG tablet TAKE 1 TABLET BY MOUTH EVERY DAY   glucose blood (ACCU-CHEK GUIDE) test strip Use as instructed to monitor glucose twice daily, before breakfast and before bed   icosapent  Ethyl (VASCEPA ) 1 g capsule Take 2 capsules (2 g total) by mouth 2 (two) times daily.   Insulin  Glargine (BASAGLAR  KWIKPEN) 100 UNIT/ML Inject 30 Units into the skin at bedtime.   Insulin  Pen Needle 31G X 5 MM MISC 1 Units by Does not apply route daily.   metoprolol  succinate (TOPROL  XL) 25 MG 24 hr tablet Take 0.5 tablets (12.5 mg total) by mouth daily.   Misc Natural Products (OSTEO BI-FLEX ADV DOUBLE ST PO) Take 1 tablet by mouth.   nitroGLYCERIN  (NITROSTAT ) 0.4 MG SL tablet Place 1 tablet (0.4 mg total) under the tongue every 5 (five) minutes x 3 doses as needed (if no relief after 2nd dose, proceed to the ED or call 911).   OneTouch Delica Lancets 33G MISC PLEASE SEE ATTACHED FOR DETAILED DIRECTIONS   pantoprazole  (PROTONIX ) 40 MG tablet TAKE 1 TABLET BY MOUTH 2 TIMES DAILY BEFORE A MEAL 30 MINUTES BEFORE BREAKFAST AND DINNER   polyethylene glycol-electrolytes (NULYTELY) 420 g solution Take 4,000 mLs by mouth once.   sitaGLIPtin  (JANUVIA ) 100 MG tablet Take  1 tablet (100 mg total) by mouth daily.   sodium bicarbonate  650 MG tablet Take 2 tablets (1,300 mg total) by mouth 2 (two) times daily, in the morning and evening.   No current facility-administered medications for this visit. (Other)   REVIEW OF SYSTEMS: ROS   Positive for: Cardiovascular, Eyes, Respiratory Negative for: Constitutional, Gastrointestinal, Neurological, Skin, Genitourinary, Musculoskeletal, HENT, Endocrine, Psychiatric, Allergic/Imm, Heme/Lymph Last edited by Resa Delon ORN, COT on 03/13/2024  1:04 PM.     ALLERGIES Allergies  Allergen Reactions   Reglan  [Metoclopramide ] Other (See Comments)    Tremors, parkinson like symptoms    PAST MEDICAL  HISTORY Past Medical History:  Diagnosis Date   CHF (congestive heart failure) (HCC)    Chronic kidney disease    Coronary artery disease    COVID    COVID 02/2020   very sick - admitted to the hospital   Diabetes mellitus without complication (HCC)    Diabetic retinopathy (HCC)    Diabetic retinopathy (HCC)    GERD (gastroesophageal reflux disease)    Headache    High cholesterol    History of kidney stones    Hypertension    was diagnosed 2 years ago, never treated and states BP is always - documented 07/13/21   Ichthyosis vulgaris    Macular edema    Pneumonia    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   Past Surgical History:  Procedure Laterality Date   CARDIAC CATHETERIZATION     IR ANGIO INTRA EXTRACRAN SEL COM CAROTID INNOMINATE BILAT MOD SED  04/17/2021   IR ANGIO INTRA EXTRACRAN SEL INTERNAL CAROTID UNI R MOD SED  07/17/2021   IR ANGIO VERTEBRAL SEL VERTEBRAL BILAT MOD SED  04/17/2021   IR CT HEAD LTD  07/15/2021   IR INTRA CRAN STENT  07/15/2021   IR RADIOLOGIST EVAL & MGMT  03/05/2021   IR RADIOLOGIST EVAL & MGMT  05/04/2021   IR RADIOLOGIST EVAL & MGMT  07/31/2021   IR US  GUIDE VASC ACCESS RIGHT  04/17/2021   IR US  GUIDE VASC ACCESS RIGHT  07/15/2021   RADIOLOGY WITH ANESTHESIA N/A 07/15/2021   Procedure: STENTING;  Surgeon: Dolphus Carrion, MD;  Location: [facility] OR;  Service: Radiology;  Laterality: N/A;   TONSILLECTOMY     wisdom teeth removal     FAMILY HISTORY Family History  Problem Relation Age of Onset   Bursitis Mother    Bladder Cancer Mother 7       smoker   SOCIAL HISTORY Social History   Tobacco Use   Smoking status: Never   Smokeless tobacco: Never  Vaping Use   Vaping status: Never Used  Substance Use Topics   Alcohol use: Not Currently   Drug use: Never       OPHTHALMIC EXAM:  Base Eye Exam     Visual Acuity (Snellen - Linear)       Right Left   Dist cc 20/50 -2 20/150 -2   Dist ph cc NI          Tonometry  (Tonopen, 1:08 PM)       Right Left   Pressure 14 17         Pupils       Pupils Dark Light Shape React APD   Right PERRL 3 2 Round Brisk None   Left PERRL 3 2 Round Brisk None         Visual Fields       Left  Right    Full Full         Extraocular Movement       Right Left    Full, Ortho Full, Ortho         Neuro/Psych     Oriented x3: Yes   Mood/Affect: Normal         Dilation     Both eyes: 2.5% Phenylephrine  @ 1:08 PM           Slit Lamp and Fundus Exam     Slit Lamp Exam       Right Left   Lids/Lashes Dermatochalasis - upper lid, Meibomian gland dysfunction, +Scurf Dermatochalasis - upper lid, Meibomian gland dysfunction, +Scurf   Conjunctiva/Sclera White and quiet White and quiet   Cornea trace PEE, mild tear film debris 1+PEE, mild tear film debris   Anterior Chamber deep and clear deep and clear   Iris Round and dilated, No NVI Round and dilated, No NVI   Lens Clear Clear   Anterior Vitreous Vitreous syneresis mild syneresis, Posterior vitreous detachment         Fundus Exam       Right Left   Disc Pink and Sharp, no heme Pink and sharp, fine NVD -- regressed, 360 exudates   C/D Ratio 0.2 0.2   Macula Blunted foveal reflex, severe edema with severe exudation -- improving, scattered DBH -- slightly improved Blunted foveal reflex, severe edema and exudation, scattered DBH -- improving   Vessels attenuated, Tortuous, +NVE greatest along inferior arcades -- regressed, +Sheathing -- improving attenuated, Tortuous, +NV- regressed, +Sheathing of arterioles- improved   Periphery Attached, scattered MA/DBH, heavy exudates greatest posteriorly Attached, +exudation posteriorly-improving, scattered MA / DBH           Refraction     Wearing Rx       Sphere Cylinder Axis Add   Right -2.50 +1.00 049 +1.00   Left -0.75 +1.25 178 +1.00            IMAGING AND PROCEDURES  Imaging and Procedures for 03/13/2024  OCT, Retina - OU -  Both Eyes       Right Eye Quality was good. Central Foveal Thickness: 276. Progression has improved. Findings include no SRF, abnormal foveal contour, subretinal hyper-reflective material, intraretinal hyper-reflective material, intraretinal fluid, vitreomacular adhesion (Persistent IRF, IRHM and SRHM greatest IN macula--slightly improved).   Left Eye Quality was good. Central Foveal Thickness: 262. Progression has been stable. Findings include no SRF, abnormal foveal contour, subretinal hyper-reflective material, intraretinal hyper-reflective material, epiretinal membrane, intraretinal fluid, lamellar hole, macular pucker (Persistent edema, IRHM and SRHM, +ERM with pucker).   Notes *Images captured and stored on drive  Diagnosis / Impression:  OD: Persistent IRF, IRHM and SRHM greatest IN macula--slightly improved  OS: Persistent edema, IRHM and SRHM, +ERM with pucker  Clinical management:  See below  Abbreviations: NFP - Normal foveal profile. CME - cystoid macular edema. PED - pigment epithelial detachment. IRF - intraretinal fluid. SRF - subretinal fluid. EZ - ellipsoid zone. ERM - epiretinal membrane. ORA - outer retinal atrophy. ORT - outer retinal tubulation. SRHM - subretinal hyper-reflective material. IRHM - intraretinal hyper-reflective material      Intravitreal Injection, Pharmacologic Agent - OD - Right Eye       Time Out 03/13/2024. 1:51 PM. Confirmed correct patient, procedure, site, and patient consented.   Anesthesia Topical anesthesia was used. Anesthetic medications included Lidocaine  2%, Proparacaine 0.5%.   Procedure Preparation included 5% betadine to ocular surface,  eyelid speculum. A supplied (32g) needle was used.   Injection: 6 mg faricimab -svoa 6 MG/0.05ML   Route: Intravitreal, Site: Right Eye   NDC: 49757-903-93, Lot: [id number], Expiration date: 11/21/2024, Waste: 0 mL   Post-op Post injection exam found visual acuity of at least counting fingers.  The patient tolerated the procedure well. There were no complications. The patient received written and verbal post procedure care education.      Intravitreal Injection, Pharmacologic Agent - OS - Left Eye       Time Out 03/13/2024. 1:52 PM. Confirmed correct patient, procedure, site, and patient consented.   Anesthesia Topical anesthesia was used. Anesthetic medications included Lidocaine  2%, Proparacaine 0.5%.   Procedure Preparation included 5% betadine to ocular surface, eyelid speculum. A supplied (32g) needle was used.   Injection: 6 mg faricimab -svoa 6 MG/0.05ML   Route: Intravitreal, Site: Left Eye   NDC: 49757-903-93, Lot: [id number], Expiration date: 03/23/2025, Waste: 0 mL   Post-op Post injection exam found visual acuity of at least counting fingers. The patient tolerated the procedure well. There were no complications. The patient received written and verbal post procedure care education.            ASSESSMENT/PLAN:    ICD-10-CM   1. Proliferative diabetic retinopathy of both eyes with macular edema associated with type 2 diabetes mellitus (HCC)  E11.3513 OCT, Retina - OU - Both Eyes    Intravitreal Injection, Pharmacologic Agent - OD - Right Eye    Intravitreal Injection, Pharmacologic Agent - OS - Left Eye    faricimab -svoa (VABYSMO ) 6mg /0.33mL intravitreal injection    faricimab -svoa (VABYSMO ) 6mg /0.77mL intravitreal injection    2. Current use of insulin  (HCC)  Z79.4     3. Long term (current) use of oral hypoglycemic drugs  Z79.84     4. Hyperlipidemia associated with type 2 diabetes mellitus (HCC)  E11.69    E78.5     5. Essential hypertension  I10     6. Hypertensive retinopathy of both eyes  H35.033       1-3. Proliferative diabetic retinopathy, both eyes  - A1c 9-pt reported on 08.18.25, 7.4 on 08.11.23  - s/p IVA OS #1 (02.22.24), #2 (03.22.24), #3 (04.19.24)  -s/p IVA OD #1 (02.23.24), #2 (03.22.24) #3 (04.19.24), #4 (05.17.24), #5  (06.18.24), #6 (11.08.24)  -- IVA resistance OU ================= - s/p IVE OD #1 (06.18.24), #2 (07.16.24), #3 (08.13.24), #4 (09.10.24), #5 (10.08.24), #6 (11.08.24), #7 (12.06.24), #8 (01.03.25), #9 (01.31.25), #10 (02.28.25), #11 (03.28.25) - s/p IVE OS #1 (05.17.24), #2 (06.18.24), #3 (7.16.24), #4 (08.13.24), #5 (09.10.24), #6 (10.08.24), #7 (11.08.24), #8 (12.06.24), #9 (01.03.25), #10 (01.31.25), #11 (02.28.25), #12 (03.28.25)  -- IVE resistance OU ================= - IVV OU #1 (04.25.25), #2 (05.23.25), #3 (06.23.25), #4 (07.21.25), #5 (08.18.25), #6 (09.15.25), #7 (10.13.25), #8 (11.10.25), #9 (12.17.25) - exam shows severe edema with scattered MA and DBH, but significant lipid component to edema OU - FA (02.21.24) shows OD: Large cluster of leaking NV along IT arcades, scattered patches of vascular non-perfusion; OS: +NVD and NVE along proximal arcades - repeat FA (10.13.25) shows OD: Interval improvement in large cluster of leaking NV along IT arcades, scattered patches of vascular non-perfusion; OS: Interval resolution of +NVD and NVE along proximal arcades - BCVA OD 20/50 stable; OS 20/150 - stable - OCT shows OD: Persistent IRF, IRHM and SRHM greatest IN macula--slightly improved; OS: Persistent edema, IRHM and SRHM, +ERM with pucker at 4.9 wks - recommend IVV today OU #10 (01.20.26) w/ f/u  in 4wks - pt wishes to proceed - RBA of procedure discussed, questions answered - IVV informed consent obtained and signed, 04.25.25 (OU) - see procedure note - Vabysmo  is approved for 2026, co-pay program approved - f/u 4 weeks -- DFE/OCT, possible injection(s)  4. Hyperlipidemia  - review of labs shows severely elevated cholesterol and lipids  - 08.11.23:  Trig  785 Chol  598  LDL  410 - 01.17.25: Trig 335   Chol 279   LDL 173 - likely contributing to severe edema and exudate  5,6. Hypertensive retinopathy OU - discussed importance of tight BP control - monitor  Ophthalmic Meds  Ordered this visit:  Meds ordered this encounter  Medications   faricimab -svoa (VABYSMO ) 6mg /0.86mL intravitreal injection   faricimab -svoa (VABYSMO ) 6mg /0.84mL intravitreal injection     Return in about 4 weeks (around 04/10/2024) for PDR OU, DFE, OCT, likely IVV OU.  There are no Patient Instructions on file for this visit.  This document serves as a record of services personally performed by Redell JUDITHANN Hans, MD, PhD. It was created on their behalf by Wanda GEANNIE Keens, COT an ophthalmic technician. The creation of this record is the provider's dictation and/or activities during the visit.    Electronically signed by:  Wanda GEANNIE Keens, COT  03/13/24 9:11 PM  This document serves as a record of services personally performed by Redell JUDITHANN Hans, MD, PhD. It was created on their behalf by Almetta Pesa, an ophthalmic technician. The creation of this record is the provider's dictation and/or activities during the visit.    Electronically signed by: Almetta Pesa, OA, 03/13/24  9:11 PM  Redell JUDITHANN Hans, M.D., Ph.D. Diseases & Surgery of the Retina and Vitreous [facility]  I have reviewed the above documentation for accuracy and completeness, and I agree with the above. Redell JUDITHANN Hans, M.D., Ph.D. 03/13/24 9:13 PM    Abbreviations: M myopia (nearsighted); A astigmatism; H hyperopia (farsighted); P presbyopia; Mrx spectacle prescription;  CTL contact lenses; OD right eye; OS left eye; OU both eyes  XT exotropia; ET esotropia; PEK punctate epithelial keratitis; PEE punctate epithelial erosions; DES dry eye syndrome; MGD meibomian gland dysfunction; ATs artificial tears; PFAT's preservative free artificial tears; NSC nuclear sclerotic cataract; PSC posterior subcapsular cataract; ERM epi-retinal membrane; PVD posterior vitreous detachment; RD retinal detachment; DM diabetes mellitus; DR diabetic retinopathy; NPDR non-proliferative diabetic retinopathy; PDR  proliferative diabetic retinopathy; CSME clinically significant macular edema; DME diabetic macular edema; dbh dot blot hemorrhages; CWS cotton wool spot; POAG primary open angle glaucoma; C/D cup-to-disc ratio; HVF humphrey visual field; GVF goldmann visual field; OCT optical coherence tomography; IOP intraocular pressure; BRVO Branch retinal vein occlusion; CRVO central retinal vein occlusion; CRAO central retinal artery occlusion; BRAO branch retinal artery occlusion; RT retinal tear; SB scleral buckle; PPV pars plana vitrectomy; VH Vitreous hemorrhage; PRP panretinal laser photocoagulation; IVK intravitreal kenalog; VMT vitreomacular traction; MH Macular hole;  NVD neovascularization of the disc; NVE neovascularization elsewhere; AREDS age related eye disease study; ARMD age related macular degeneration; POAG primary open angle glaucoma; EBMD epithelial/anterior basement membrane dystrophy; ACIOL anterior chamber intraocular lens; IOL intraocular lens; PCIOL posterior chamber intraocular lens; Phaco/IOL phacoemulsification with intraocular lens placement; PRK photorefractive keratectomy; LASIK laser assisted in situ keratomileusis; HTN hypertension; DM diabetes mellitus; COPD chronic obstructive pulmonary disease "

## 2024-03-13 ENCOUNTER — Ambulatory Visit (INDEPENDENT_AMBULATORY_CARE_PROVIDER_SITE_OTHER): Admitting: Ophthalmology

## 2024-03-13 ENCOUNTER — Encounter (INDEPENDENT_AMBULATORY_CARE_PROVIDER_SITE_OTHER): Payer: Self-pay | Admitting: Ophthalmology

## 2024-03-13 ENCOUNTER — Encounter (INDEPENDENT_AMBULATORY_CARE_PROVIDER_SITE_OTHER): Admitting: Ophthalmology

## 2024-03-13 ENCOUNTER — Ambulatory Visit: Payer: Self-pay | Admitting: Nurse Practitioner

## 2024-03-13 DIAGNOSIS — I10 Essential (primary) hypertension: Secondary | ICD-10-CM

## 2024-03-13 DIAGNOSIS — H35033 Hypertensive retinopathy, bilateral: Secondary | ICD-10-CM | POA: Diagnosis not present

## 2024-03-13 DIAGNOSIS — Z794 Long term (current) use of insulin: Secondary | ICD-10-CM

## 2024-03-13 DIAGNOSIS — E113513 Type 2 diabetes mellitus with proliferative diabetic retinopathy with macular edema, bilateral: Secondary | ICD-10-CM

## 2024-03-13 DIAGNOSIS — Z7984 Long term (current) use of oral hypoglycemic drugs: Secondary | ICD-10-CM | POA: Diagnosis not present

## 2024-03-13 DIAGNOSIS — E1169 Type 2 diabetes mellitus with other specified complication: Secondary | ICD-10-CM

## 2024-03-13 MED ORDER — FARICIMAB-SVOA 6 MG/0.05ML IZ SOSY
6.0000 mg | PREFILLED_SYRINGE | INTRAVITREAL | Status: AC | PRN
  Administered 2024-03-13: 6 mg via INTRAVITREAL

## 2024-03-14 ENCOUNTER — Encounter: Payer: Self-pay | Admitting: Cardiology

## 2024-03-14 ENCOUNTER — Ambulatory Visit: Admitting: Cardiology

## 2024-03-14 VITALS — BP 108/66 | HR 85 | Ht 68.0 in | Wt 173.6 lb

## 2024-03-14 DIAGNOSIS — I10 Essential (primary) hypertension: Secondary | ICD-10-CM | POA: Diagnosis not present

## 2024-03-14 DIAGNOSIS — I2510 Atherosclerotic heart disease of native coronary artery without angina pectoris: Secondary | ICD-10-CM | POA: Diagnosis not present

## 2024-03-14 DIAGNOSIS — E781 Pure hyperglyceridemia: Secondary | ICD-10-CM

## 2024-03-14 DIAGNOSIS — I779 Disorder of arteries and arterioles, unspecified: Secondary | ICD-10-CM

## 2024-03-14 DIAGNOSIS — I255 Ischemic cardiomyopathy: Secondary | ICD-10-CM

## 2024-03-14 DIAGNOSIS — E785 Hyperlipidemia, unspecified: Secondary | ICD-10-CM

## 2024-03-14 DIAGNOSIS — I739 Peripheral vascular disease, unspecified: Secondary | ICD-10-CM

## 2024-03-14 NOTE — Progress Notes (Signed)
 " Cardiology Office Note:  .   Date:  03/14/2024  ID:  Jose Koch, DOB 1970/08/11, MRN [id number] PCP: Jose Browning, FNP  [facility] Providers Cardiologist:  Jose SHAUNNA Maywood, MD {  History of Present Illness: .   Jose Koch is a 54 y.o. male with history of CAD, ICM with mildly reduced EF, hyperlipidemia, lower extremity PAD, carotid artery disease with R ICA 06/2021, cerebral aneurysm on MRI 05/2023, hypertension, type 2 diabetes, thrombocytopenia and splenomegaly followed by oncology, IDA, CKD, recurrent syncope versus focal seizures followed by neurology.     CAD History of hypertriglyceridemia followed by a lipid clinic Genetic testing with no genetic component 05/2023 EF 45% with hypokinesis in the inferoseptal and inferior walls. 08/2023 coronary CTA with CAC score 14.4, with severe multivessel CAD.  Occluded mid RCA with collateral filling, high occlusion OM1, severe stenosis proximal LAD.  LHC recommended.  PAD 09/2022 ABIs with moderate disease bilaterally.  Left unchanged.  Right decreased from 2023. Right  0.78       0.58       0.87        1.07 (06/02/20)  +-------+-----------+-----------+------------+--------------+  Left  0.68       0.60       0.67        0.47          Carotid artery disease 06/2021 right ICA stent.  Cerebral aneurysm Previous aneurysm noted on cerebral angiogram 03/2021 although on repeat imaging no further mention of this. 05/2023 brain MRI with no mention of aneurysm.   Social history  No history of tobacco, alcohol or drug use. Lives active 1 to 2 days/week. No family history of MI. Not currently working.     Patient had an echocardiogram with mildly reduced EF, EF 45% with RWMA prompting coronary CTA which demonstrated multivessel disease significant by FFR.  He has been asymptomatic without any complaints of chest pain or shortness of breath.  However seen last 01/2024 with plans of diagnostic left heart  catheterization given asymptomatic symptoms as well as underlying CKD.  Reportedly had heme positive stool, colonoscopy/EGD canceled with underlying MVD.  Hemoglobin has been stable while on aspirin .  He has been reporting improved stamina levels after going to cardiac rehab.  GDMT limited by underlying renal disease followed by nephrology.  Today patient presents to obtain HPI to proceed with left heart catheterization.  He continues to report no complaints of chest pain, shortness of breath, peripheral edema or any other cardiac symptoms listed below.  Reports no significant claudication symptoms.  He does report that sometimes he will cramp in his toes which will then cause his calf to hurt but he says he is walking about 10 minutes at a time without any issues.  No changes in ADLs.  He was not taking his Repatha  previously secondary to wife's illnesses.  He is now taking it routinely again and just took his third dose.  Reports no bleeding issues.  ROS: Denies: Chest pain, shortness of breath, orthopnea, peripheral edema, palpitations, syncope, decreased exercise capacity, fatigue, dizziness.   Studies Reviewed: Jose Koch    EKG Interpretation Date/Time:  Wednesday March 14 2024 09:15:32 EST Ventricular Rate:  85 PR Interval:  176 QRS Duration:  98 QT Interval:  392 QTC Calculation: 466 R Axis:   50  Text Interpretation: Normal sinus rhythm Normal ECG When compared with ECG of 26-Sep-2023 13:56, T wave inversion no longer evident in Inferior leads Confirmed by Jose Koch [phone]) on  03/14/2024 9:23:08 AM    Risk Assessment/Calculations:            Physical Exam:   VS:  BP 108/66   Pulse 85   Ht 5' 8 (1.727 m)   Wt 173 lb 9.6 oz (78.7 kg)   SpO2 99%   BMI 26.40 kg/m    Wt Readings from Last 3 Encounters:  03/14/24 173 lb 9.6 oz (78.7 kg)  02/09/24 167 lb (75.8 kg)  02/01/24 165 lb 11.2 oz (75.2 kg)    GEN: Well nourished, well developed in no acute distress NECK: No JVD; No carotid  bruits CARDIAC: RRR, no murmurs, rubs, gallops RESPIRATORY:  Clear to auscultation without rales, wheezing or rhonchi  ABDOMEN: Soft, non-tender, non-distended EXTREMITIES:  No edema; No deformity   ASSESSMENT AND PLAN: .    CAD Hyperlipidemia -08/2023  coronary CTA with CAC score 14.4, with severe multivessel CAD.  Occluded mid RCA with collateral filling, high occlusion OM1, severe stenosis proximal LAD. As previously stated, per primary cardiologist proceeding with diagnostic left heart catheterization given underlying renal disease and that he has remained completely asymptomatic with no exertional limitations or chest pain.  EKG today with no acute ST-T wave changes it actually shows improvement in the inferior leads.  See conversation below, has multiple comorbidities that should be taken into consideration if he ends up needing stent placement/DAPT therapy.  Continue with aspirin , Toprol -XL 12.5 mg, atorvastatin  40 mg, Repatha , fenofibrate , Vascepa . 01/2024 LDL 271.  Triglycerides 512.  Genetic testing unremarkable.  Now taking Repatha  again consistently and on his third dose.  Unsure if he has had myalgias in the past but if able to tolerate would increase atorvastatin  80 mg and repeat lipid studies in 6 to 8 weeks. Already has updated BMP and CBC for the last month.  We will fax his KPN with these updated labs to his chart.  Labs done at PCPs office 12/30.  ICM with mildly reduced EF -05/2023 EF 45% with RWMA.  No valve disease. NYHA class I/II symptoms.  Euvolemic on exam. GDMT: Continue Toprol -XL 12.5 mg, Jardiance  25 mg daily.    Lower extremity PAD Stable ABIs 2024 with no significant claudication symptoms.   CAD with R ICA 06/2021 Followed by vascular surgery  Hypertension Blood pressure well-controlled in the context above.  Diabetes CKD Poorly controlled diabetes with diabetic retinopathy.  Followed by nephrology.  Thrombocytopenia/splenomegaly Followed by oncology.   Will need to take any consideration in the context of DAPT therapy.  Platelets appear stable for the past year.  Cerebral aneurysm Looks like he had cerebral aneurysm noted in 2023.  Has brain MRI 05/2023 with no mention of this however nephrology note 11/2023 notes cerebral aneurysm has part of their assessment plan and they were referring to new interventional neuroradiologist for further management of intracranial ICA stent and cerebral aneurysm.    05/2023 brain MRI 1. 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 2. Additional foci of susceptibility artifact in the in the right cerebral hemisphere, may represent chronic microhemorrhages. Several of these appear new since prior examination in 2022.   Informed Consent   Shared Decision Making/Informed Consent{  The risks [stroke (1 in 1000), death (1 in 1000), kidney failure [usually temporary] (1 in 500), bleeding (1 in 200), allergic reaction [possibly serious] (1 in 200)], benefits (diagnostic support and management of coronary artery disease) and alternatives of a cardiac catheterization were discussed in detail with Mr. Hofacker and he is willing to proceed.  Dispo: 2-week follow-up after his cardiac catheterization.  Signed, Thom LITTIE Sluder, PA-C  "

## 2024-03-14 NOTE — Patient Instructions (Addendum)
 Medication Instructions:  None  *If you need a refill on your cardiac medications before your next appointment, please call your pharmacy*  Lab Work: None  If you have labs (blood work) drawn today and your tests are completely normal, you will receive your results only by: MyChart Message (if you have MyChart) OR A paper copy in the mail If you have any lab test that is abnormal or we need to change your treatment, we will call you to review the results.  Testing/Procedures: None   Follow-Up: At [facility], you and your health needs are our priority.  As part of our continuing mission to provide you with exceptional heart care, our providers are all part of one team.  This team includes your primary Cardiologist (physician) and Advanced Practice Providers or APPs (Physician Assistants and Nurse Practitioners) who all work together to provide you with the care you need, when you need it.  Your next appointment:   2 weeks after heart cath.   Provider:   You may see Vishnu P Mallipeddi, MD or the following Advanced Practice Provider on your designated Care Team:   Johnson, NP  We recommend signing up for the patient portal called MyChart.  Sign up information is provided on this After Visit Summary.  MyChart is used to connect with patients for Virtual Visits (Telemedicine).  Patients are able to view lab/test results, encounter notes, upcoming appointments, etc.  Non-urgent messages can be sent to your provider as well.   To learn more about what you can do with MyChart, go to [email or web address].   Other Instructions None

## 2024-03-15 ENCOUNTER — Telehealth: Payer: Self-pay | Admitting: Internal Medicine

## 2024-03-15 ENCOUNTER — Other Ambulatory Visit: Payer: Self-pay | Admitting: Hematology

## 2024-03-15 ENCOUNTER — Other Ambulatory Visit (HOSPITAL_BASED_OUTPATIENT_CLINIC_OR_DEPARTMENT_OTHER): Payer: Self-pay

## 2024-03-15 MED ORDER — FOLIC ACID 1 MG PO TABS
1.0000 mg | ORAL_TABLET | Freq: Every day | ORAL | 1 refills | Status: AC
  Filled 2024-03-15: qty 90, 90d supply, fill #0

## 2024-03-15 NOTE — Telephone Encounter (Signed)
 Pt saw PA at [facility] on 1/21- cath scheduled for 1/29.

## 2024-03-15 NOTE — Telephone Encounter (Signed)
 Okay per chart review to refill Folic acid .

## 2024-03-15 NOTE — Telephone Encounter (Signed)
 Caller Edwardo) returned staff call.

## 2024-03-20 ENCOUNTER — Telehealth: Payer: Self-pay | Admitting: *Deleted

## 2024-03-20 DIAGNOSIS — I2510 Atherosclerotic heart disease of native coronary artery without angina pectoris: Secondary | ICD-10-CM

## 2024-03-20 DIAGNOSIS — Z01812 Encounter for preprocedural laboratory examination: Secondary | ICD-10-CM

## 2024-03-20 NOTE — Telephone Encounter (Signed)
 Cardiac Catheterization scheduled at [facility] for: Thursday March 22, 2024 9 AM Arrival time [facility] Main Entrance A at: 7 AM  Diet: -Nothing to eat after midnight.  Hydration: -May drink clear liquids until 2 hours before the procedure.  Approved liquids: Water, clear tea, black coffee, fruit juices-non-citric and without pulp,Gatorade, plain Jello/popsicles.   -Please drink 8 oz of water 2 hours before procedure.  Medication instructions: -Hold:  Jardiance /Januvia -AM of procedure  Insulin -1/2 usual dose HS prior to procedure-pt tells me he does not take Insulin  in the AM -Other usual morning medications can be taken including aspirin  81 mg.  Plan to go home the same day, you will only stay overnight if medically necessary.  You must have responsible adult to drive you home.  Someone must be with you the first 24 hours after you arrive home.  Reviewed procedure instructions with patient.

## 2024-03-20 NOTE — Telephone Encounter (Signed)
 02/21/24 CMP/CBCd results in [facility].

## 2024-03-21 ENCOUNTER — Telehealth: Payer: Self-pay | Admitting: Internal Medicine

## 2024-03-21 ENCOUNTER — Other Ambulatory Visit: Payer: Self-pay | Admitting: *Deleted

## 2024-03-21 ENCOUNTER — Ambulatory Visit: Admitting: Gastroenterology

## 2024-03-21 DIAGNOSIS — I2510 Atherosclerotic heart disease of native coronary artery without angina pectoris: Secondary | ICD-10-CM

## 2024-03-21 DIAGNOSIS — Z01812 Encounter for preprocedural laboratory examination: Secondary | ICD-10-CM

## 2024-03-21 NOTE — Addendum Note (Signed)
 Addended by: Sy Saintjean M on: 03/21/2024 10:29 AM   Modules accepted: Orders
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
## 2024-03-21 NOTE — Telephone Encounter (Signed)
 Procedure has been rescheduled to 2/6 by Arlean Lipps, RN.
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
## 2024-03-28 ENCOUNTER — Ambulatory Visit: Payer: Self-pay | Admitting: Internal Medicine

## 2024-03-28 ENCOUNTER — Telehealth: Payer: Self-pay | Admitting: Internal Medicine

## 2024-03-28 LAB — BASIC METABOLIC PANEL WITH GFR
BUN/Creatinine Ratio: 13 (ref 9–20)
BUN: 23 mg/dL (ref 6–24)
CO2: 22 mmol/L (ref 20–29)
Calcium: 9 mg/dL (ref 8.7–10.2)
Chloride: 107 mmol/L — ABNORMAL HIGH (ref 96–106)
Creatinine, Ser: 1.72 mg/dL — ABNORMAL HIGH (ref 0.76–1.27)
Glucose: 165 mg/dL — ABNORMAL HIGH (ref 70–99)
Potassium: 5.1 mmol/L (ref 3.5–5.2)
Sodium: 143 mmol/L (ref 134–144)
eGFR: 48 mL/min/{1.73_m2} — ABNORMAL LOW

## 2024-03-28 LAB — CBC
Hematocrit: 36.4 % — ABNORMAL LOW (ref 37.5–51.0)
Hemoglobin: 11.5 g/dL — ABNORMAL LOW (ref 13.0–17.7)
MCH: 27.2 pg (ref 26.6–33.0)
MCHC: 31.6 g/dL (ref 31.5–35.7)
MCV: 86 fL (ref 79–97)
Platelets: 137 10*3/uL — ABNORMAL LOW (ref 150–450)
RBC: 4.23 x10E6/uL (ref 4.14–5.80)
RDW: 14.1 % (ref 11.6–15.4)
WBC: 6 10*3/uL (ref 3.4–10.8)
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
## 2024-03-28 NOTE — Telephone Encounter (Signed)
 Girlfriend, is calling to get patient cath r/s. Please advise

## 2024-04-04 ENCOUNTER — Encounter (HOSPITAL_COMMUNITY): Admission: RE | Payer: Self-pay | Source: Home / Self Care

## 2024-04-04 ENCOUNTER — Ambulatory Visit (HOSPITAL_COMMUNITY): Admission: RE | Admit: 2024-04-04 | Source: Home / Self Care | Admitting: Cardiology

## 2024-04-11 ENCOUNTER — Encounter (INDEPENDENT_AMBULATORY_CARE_PROVIDER_SITE_OTHER): Admitting: Ophthalmology

## 2024-05-17 ENCOUNTER — Ambulatory Visit: Admitting: Gastroenterology

## 2024-05-25 ENCOUNTER — Ambulatory Visit: Admitting: Nurse Practitioner

## 2024-06-25 ENCOUNTER — Other Ambulatory Visit

## 2024-07-23 ENCOUNTER — Ambulatory Visit: Admitting: Neurology

## 2024-08-03 ENCOUNTER — Inpatient Hospital Stay

## 2024-08-10 ENCOUNTER — Inpatient Hospital Stay: Admitting: Oncology
